# Patient Record
Sex: Female | Born: 1975 | Race: White | Hispanic: No | Marital: Single | Smoking: Former smoker
Health system: Southern US, Community
[De-identification: ages and names within clinical notes are randomized; demographics above are authoritative.]

## PROBLEM LIST (undated history)

## (undated) DIAGNOSIS — F411 Generalized anxiety disorder: Secondary | ICD-10-CM

## (undated) DIAGNOSIS — K219 Gastro-esophageal reflux disease without esophagitis: Secondary | ICD-10-CM

## (undated) DIAGNOSIS — G4730 Sleep apnea, unspecified: Secondary | ICD-10-CM

## (undated) DIAGNOSIS — Z8719 Personal history of other diseases of the digestive system: Secondary | ICD-10-CM

## (undated) DIAGNOSIS — Z860100 Personal history of colon polyps, unspecified: Secondary | ICD-10-CM

## (undated) DIAGNOSIS — J45909 Unspecified asthma, uncomplicated: Secondary | ICD-10-CM

## (undated) DIAGNOSIS — G43909 Migraine, unspecified, not intractable, without status migrainosus: Secondary | ICD-10-CM

## (undated) DIAGNOSIS — Z9109 Other allergy status, other than to drugs and biological substances: Secondary | ICD-10-CM

## (undated) DIAGNOSIS — J984 Other disorders of lung: Secondary | ICD-10-CM

## (undated) DIAGNOSIS — I10 Essential (primary) hypertension: Secondary | ICD-10-CM

## (undated) DIAGNOSIS — R1319 Other dysphagia: Secondary | ICD-10-CM

## (undated) DIAGNOSIS — M1990 Unspecified osteoarthritis, unspecified site: Secondary | ICD-10-CM

## (undated) DIAGNOSIS — A4902 Methicillin resistant Staphylococcus aureus infection, unspecified site: Secondary | ICD-10-CM

## (undated) HISTORY — DX: Other disorders of lung: J98.4

## (undated) HISTORY — DX: Gastro-esophageal reflux disease without esophagitis: K21.9

## (undated) HISTORY — DX: Other allergy status, other than to drugs and biological substances: Z91.09

## (undated) HISTORY — DX: Unspecified asthma, uncomplicated: J45.909

## (undated) HISTORY — DX: Generalized anxiety disorder: F41.1

## (undated) HISTORY — PX: DILATION AND CURETTAGE OF UTERUS: SHX78

## (undated) HISTORY — DX: Migraine, unspecified, not intractable, without status migrainosus: G43.909

## (undated) HISTORY — PX: NECK SURGERY: SHX720

## (undated) HISTORY — DX: Unspecified osteoarthritis, unspecified site: M19.90

## (undated) HISTORY — PX: TYMPANOSTOMY TUBE PLACEMENT: SHX32

## (undated) HISTORY — PX: CHOLECYSTECTOMY: SHX55

## (undated) HISTORY — DX: Personal history of colon polyps, unspecified: Z86.0100

## (undated) HISTORY — DX: Personal history of colonic polyps: Z86.010

---

## 2009-04-13 LAB — HM COLONOSCOPY: HM Colonoscopy: NORMAL

## 2010-08-01 LAB — PULMONARY FUNCTION TEST

## 2015-04-26 ENCOUNTER — Ambulatory Visit (INDEPENDENT_AMBULATORY_CARE_PROVIDER_SITE_OTHER): Payer: BLUE CROSS/BLUE SHIELD | Admitting: Primary Care

## 2015-04-26 ENCOUNTER — Encounter: Payer: Self-pay | Admitting: Primary Care

## 2015-04-26 ENCOUNTER — Ambulatory Visit (INDEPENDENT_AMBULATORY_CARE_PROVIDER_SITE_OTHER)
Admission: RE | Admit: 2015-04-26 | Discharge: 2015-04-26 | Disposition: A | Discharge status: Home / Self Care | Payer: BLUE CROSS/BLUE SHIELD | Source: Ambulatory Visit | Attending: Primary Care | Admitting: Primary Care

## 2015-04-26 VITALS — BP 124/80 | HR 71 | Temp 98.7°F | Ht 67.25 in | Wt 207.1 lb

## 2015-04-26 DIAGNOSIS — J4520 Mild intermittent asthma, uncomplicated: Secondary | ICD-10-CM | POA: Diagnosis not present

## 2015-04-26 DIAGNOSIS — Z842 Family history of other diseases of the genitourinary system: Secondary | ICD-10-CM | POA: Diagnosis not present

## 2015-04-26 DIAGNOSIS — J45909 Unspecified asthma, uncomplicated: Secondary | ICD-10-CM | POA: Insufficient documentation

## 2015-04-26 DIAGNOSIS — R51 Headache: Secondary | ICD-10-CM

## 2015-04-26 DIAGNOSIS — K219 Gastro-esophageal reflux disease without esophagitis: Secondary | ICD-10-CM | POA: Insufficient documentation

## 2015-04-26 DIAGNOSIS — M5441 Lumbago with sciatica, right side: Secondary | ICD-10-CM

## 2015-04-26 DIAGNOSIS — R220 Localized swelling, mass and lump, head: Secondary | ICD-10-CM

## 2015-04-26 DIAGNOSIS — F411 Generalized anxiety disorder: Secondary | ICD-10-CM | POA: Diagnosis not present

## 2015-04-26 DIAGNOSIS — M1990 Unspecified osteoarthritis, unspecified site: Secondary | ICD-10-CM

## 2015-04-26 DIAGNOSIS — R519 Headache, unspecified: Secondary | ICD-10-CM | POA: Insufficient documentation

## 2015-04-26 HISTORY — DX: Lumbago with sciatica, right side: M54.41

## 2015-04-26 MED ORDER — ESCITALOPRAM OXALATE 10 MG PO TABS: 10.0000 mg | ORAL_TABLET | Freq: Every day | ORAL | Status: DC

## 2015-04-26 NOTE — Assessment & Plan Note (Signed)
Long history of. Intensity worse over 2-3 months since mass to cranium. Mass appears to be benign cyst. Explained this to patient who is concerned and would like further evaluation.  US soft tissue ordered as it is causing discomfort.

## 2015-04-26 NOTE — Progress Notes (Signed)
Subjective:    Patient ID: Hannah Klein, female    DOB: Aug 02, 1975, 40 y.o.   MRN: [id number]  HPI  Hannah Klein is a 40 year old female who presents today to establish care and discuss the problems mentioned below. Will obtain old records. Her last physical was in August 2016.  1) Osteoarthritis: Long standing history and has undergone imaging years ago. Located down spinal column from neck to lumbar spine per patient. Currently managed on oxaprozin 600 mg daily. She continues to experience chronic pain to her shoulder, neck, lower back.  She was once managed by pain medicine and completed injections. She didn't feel as though the injections helped to improve her pain. Over the past several months she's noticed right low back pain which radiates down to her right lower extremity. Denies recent injury or trauma.  2) Asthma: Diagnosed 10 years ago. Currently managed on Ventolin HFA and will use twice daily everyday, but recently also during the day. She's not had recent asthma testing since 2012 and was once receiving allergy injections. Denies wheezing. She's recently moved from [place] to [facility] and has noticed an increase in her allergies. She's managed on Claritin daily.   3) GERD: Diagnosed years ago. History of slow digestion as she was tested in the past. Currently managed on Nexium 40 mg as she will have regurgitation of her food if she doesn't take.   4) Anxiety and Depression: History of depression in the past. She was once managed on Prozac for about 1 year. She didn't like the way she felt on this medication so she stopped taking. She continues to experience anxiety with large crowds and when being in new places.   She's currently managed on hydroxyzine PRN for anxiety. Some days she will take it several times, others she will not require. She also has difficulty sleeping. She will have difficulty falling asleep and staying asleep. She feels as though her mind races when she gets  to bed. She will take Zzzquil or hydroxyzine to help her fall asleep. She's also using essential oils. GAD 7 score of 16 today. Denise symptoms of depression.  5) Mass to cranium: Located to the left occipital region of her cranium and has been present for the past 2-3 months. She's noticed increase in headaches since this mass has been present. Mild tenderness, no enlargement, no recent injury.  6) Frequent Headaches/Migraines: Present for years and will occur multiple times weekly. Typically her headaches are located to the frontal lobes, but over the past 2-3 months her headaches have been located to the parietal lobes, especially since the mass to her head as evlolved. She's taken tylenol and ibuprofen with temporary improvement. Migraines occur 2-3 times annually. Denies changes in vision, confusion, disorientation.   Review of Systems  Constitutional: Negative for fever.  HENT: Negative for rhinorrhea.   Eyes: Negative for visual disturbance.  Respiratory: Positive for shortness of breath. Negative for cough and wheezing.   Cardiovascular: Negative for chest pain.  Gastrointestinal:       GERD  Genitourinary:       Regular periods  Musculoskeletal: Positive for back pain and arthralgias.  Allergic/Immunologic: Positive for environmental allergies.  Neurological: Positive for headaches. Negative for numbness.  Psychiatric/Behavioral: Positive for sleep disturbance. The patient is nervous/anxious.        See HPI       Past Medical History  Diagnosis Date  . Asthma   . Arthritis   . Generalized anxiety disorder   .  Migraines   . GERD (gastroesophageal reflux disease)   . Hx of colonic polyps     Social History   Social History  . Marital Status: Single    Spouse Name: N/A  . Number of Children: N/A  . Years of Education: N/A   Occupational History  . Not on file.   Social History Main Topics  . Smoking status: Not on file  . Smokeless tobacco: Not on file  . Alcohol  Use: Not on file  . Drug Use: Not on file  . Sexual Activity: Not on file   Other Topics Concern  . Not on file   Social History Narrative   Single.   Moved from [place].   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       Past Surgical History  Procedure Laterality Date  . Cholecystectomy    . Neck surgery      Fusion to C6 and C7  . Dilation and curettage of uterus      No family history on file.  Allergies  Allergen Reactions  . Advair Diskus [Fluticasone-Salmeterol]     No current outpatient prescriptions on file prior to visit.   No current facility-administered medications on file prior to visit.    BP 124/80 mmHg  Pulse 71  Temp(Src) 98.7 F (37.1 C) (Oral)  Ht 5' 7.25" (1.708 m)  Wt 207 lb 1.9 oz (93.949 kg)  BMI 32.20 kg/m2  SpO2 99%  LMP 04/14/2015    Objective:   Physical Exam  Constitutional: She is oriented to person, place, and time. She appears well-nourished.  Eyes: EOM are normal.  Neck: Neck supple.  Cardiovascular: Normal rate and regular rhythm.   Pulmonary/Chest: Breath sounds normal. She has no wheezes. She has no rales.  Musculoskeletal: Normal range of motion.  Negative straight leg raise bilaterally.  Neurological: She is alert and oriented to person, place, and time. No cranial nerve deficit.  Skin: Skin is warm and dry.  Psychiatric: She has a normal mood and affect.          Assessment & Plan:  >45 minutes spent face to face with patient, >50% spent counseling or coordinating care.

## 2015-04-26 NOTE — Assessment & Plan Note (Signed)
managed on Nexium 40 mg daily. Endorses study by GI in past with slow digestion. Will vomit undigested food without Nexium. Discussed long term effects of this medication. Continue same.

## 2015-04-26 NOTE — Progress Notes (Signed)
Pre visit review using our clinic review tool, if applicable. No additional management support is needed unless otherwise documented below in the visit note. 

## 2015-04-26 NOTE — Assessment & Plan Note (Signed)
Xray pending. Will obtain old records, consider MRI.

## 2015-04-26 NOTE — Assessment & Plan Note (Signed)
Endorses presence to shoulder, neck, and spinal column from prior imaging. Once managed per pain management with injections, no improvement. Increased pain to right lower back with radiation to RLE. Exam unremarkable. Xray pending. Will obtain old records and consider MRI.

## 2015-04-26 NOTE — Assessment & Plan Note (Addendum)
Long history of. Currently on hydroxyzine. Uncontrolled today with GAD 7 score of 16.  Start Lexapro 10 mg. Patient is to take 1/2 tablet daily for 6 days, then advance to 1 full tablet thereafter. We discussed possible side effects of headache, GI upset, drowsiness, and SI/HI. If thoughts of SI/HI develop, we discussed to present to the emergency immediately. Patient verbalized understanding.   Continue Hydroxyzine PRN.  Follow up in 6 weeks for re-evaluation.

## 2015-04-26 NOTE — Assessment & Plan Note (Signed)
History of for 10 years. Currently managed on Ventolin which she uses BID and sometimes more. Spirometry normal in office today. No wheezing on exam.  Continue daily Claritin.  Due to use of Ventolin, will consider adding low dose ICS.

## 2015-04-26 NOTE — Patient Instructions (Signed)
Complete xray(s) prior to leaving today. I will notify you of your results once received.  Stop by the front desk and speak with either Shirlee Limerick or Revonda Standard regarding your ultrasound appointment.   Start Lexapro tablets for anxiety. Take 1/2 tablet daily for 6 days, then advance to 1 full tablet thereafter.  Follow up in 6 weeks for re-evaluation of anxiety and sleep.  It was a pleasure to meet you today! Please don't hesitate to call me with any questions. Welcome to [facility]!

## 2015-04-27 ENCOUNTER — Telehealth: Payer: Self-pay | Admitting: Primary Care

## 2015-04-27 NOTE — Telephone Encounter (Signed)
Please notify [name] that I've received/reviewed her imaging records and think she may need another MRI of her back. If she's okay with this I'll order it.  Let me know, thanks.

## 2015-04-28 ENCOUNTER — Telehealth: Payer: Self-pay | Admitting: Primary Care

## 2015-04-28 DIAGNOSIS — M5442 Lumbago with sciatica, left side: Principal | ICD-10-CM

## 2015-04-28 DIAGNOSIS — M5441 Lumbago with sciatica, right side: Secondary | ICD-10-CM

## 2015-04-28 NOTE — Telephone Encounter (Signed)
Called and notified patient of Kate's comments. Patient stated that she will go do the MRI and already let patient know someone in our office will call her. Patient also stated that she is claustrophobic and the last time, she was given valium.

## 2015-04-28 NOTE — Telephone Encounter (Signed)
Noted. MRI placed. 

## 2015-04-29 ENCOUNTER — Telehealth: Payer: Self-pay | Admitting: Primary Care

## 2015-04-29 DIAGNOSIS — F40240 Claustrophobia: Secondary | ICD-10-CM

## 2015-04-29 MED ORDER — DIAZEPAM 5 MG PO TABS: ORAL_TABLET | ORAL | Status: DC

## 2015-04-29 NOTE — Addendum Note (Signed)
Addended by: Tawnya CrookSAMBATH, Dennie Moltz on: 04/29/2015 03:16 PM   Modules accepted: Orders

## 2015-04-29 NOTE — Telephone Encounter (Signed)
Patient is requesting a RX for Valium in order to get through the MRI. Please call her in a RX.

## 2015-04-29 NOTE — Telephone Encounter (Signed)
Called in the Valium to [facility].

## 2015-04-29 NOTE — Telephone Encounter (Signed)
Okay to phone in Valium 5 mg tablets. Take 1 tablet by mouth 45 minutes prior to MRI. May repeat with second dose if no improvement after 1 hour.  #2, no refills.

## 2015-04-29 NOTE — Telephone Encounter (Signed)
Called patient that the Rx has been called in. Patient verbalized understanding.

## 2015-05-02 ENCOUNTER — Telehealth: Payer: Self-pay | Admitting: Primary Care

## 2015-05-02 ENCOUNTER — Ambulatory Visit
Admission: RE | Admit: 2015-05-02 | Discharge: 2015-05-02 | Disposition: A | Discharge status: Home / Self Care | Payer: BLUE CROSS/BLUE SHIELD | Source: Ambulatory Visit | Attending: Primary Care | Admitting: Primary Care

## 2015-05-02 DIAGNOSIS — R220 Localized swelling, mass and lump, head: Secondary | ICD-10-CM

## 2015-05-02 NOTE — Telephone Encounter (Signed)
Rec'd from [facility] forward 116 pages to Dr. Chestine Sporelark

## 2015-05-03 ENCOUNTER — Other Ambulatory Visit: Payer: Self-pay | Admitting: Primary Care

## 2015-05-03 ENCOUNTER — Telehealth: Payer: Self-pay | Admitting: Primary Care

## 2015-05-03 DIAGNOSIS — R220 Localized swelling, mass and lump, head: Secondary | ICD-10-CM

## 2015-05-03 DIAGNOSIS — R221 Localized swelling, mass and lump, neck: Principal | ICD-10-CM

## 2015-05-03 NOTE — Telephone Encounter (Signed)
Please notify [name] that the insurance will not pay for another MRI to evaluate her back pain. We have a few options:  1) We can send her to pain management for further evaluation. 2) We can send her to neurosurgery for further evaluation. 3) We can try physical therapy to help with symptoms.  Please notify me what she'd like to do. Thanks.

## 2015-05-04 ENCOUNTER — Other Ambulatory Visit: Payer: Self-pay | Admitting: Primary Care

## 2015-05-04 ENCOUNTER — Ambulatory Visit (INDEPENDENT_AMBULATORY_CARE_PROVIDER_SITE_OTHER)
Admission: RE | Admit: 2015-05-04 | Discharge: 2015-05-04 | Disposition: A | Discharge status: Home / Self Care | Payer: BLUE CROSS/BLUE SHIELD | Source: Ambulatory Visit | Attending: Primary Care | Admitting: Primary Care

## 2015-05-04 DIAGNOSIS — R221 Localized swelling, mass and lump, neck: Principal | ICD-10-CM

## 2015-05-04 DIAGNOSIS — R220 Localized swelling, mass and lump, head: Secondary | ICD-10-CM

## 2015-05-04 MED ORDER — IOHEXOL 300 MG/ML  SOLN
80.0000 mL | Freq: Once | INTRAMUSCULAR | Status: AC | PRN
  Administered 2015-05-04: 80 mL via INTRAVENOUS

## 2015-05-04 NOTE — Telephone Encounter (Signed)
Called and notified patient of Kate's comments. Patient verbalized understanding. Patient stated she wants to check with her insurance first then let us know.

## 2015-05-05 ENCOUNTER — Telehealth: Payer: Self-pay | Admitting: Primary Care

## 2015-05-05 DIAGNOSIS — M545 Low back pain: Secondary | ICD-10-CM

## 2015-05-05 NOTE — Telephone Encounter (Signed)
Patient returned Chan's call. °

## 2015-05-05 NOTE — Telephone Encounter (Signed)
Called patient and notified her of her CT results. Patient stated that she have not checked her insurance why they will not pay for another MRI. She said she would like the referral to pain management for further evaluation.

## 2015-05-05 NOTE — Telephone Encounter (Signed)
Noted  Referral placed.

## 2015-05-05 NOTE — Telephone Encounter (Signed)
Already spoken to patient this morning regarding referral to pain management.

## 2015-05-06 ENCOUNTER — Encounter: Payer: Self-pay | Admitting: Primary Care

## 2015-05-12 ENCOUNTER — Other Ambulatory Visit: Payer: Self-pay | Admitting: Pain Medicine

## 2015-05-12 ENCOUNTER — Ambulatory Visit (HOSPITAL_COMMUNITY): Payer: BLUE CROSS/BLUE SHIELD

## 2015-05-12 DIAGNOSIS — M545 Low back pain: Secondary | ICD-10-CM

## 2015-05-21 ENCOUNTER — Ambulatory Visit
Admission: RE | Admit: 2015-05-21 | Discharge: 2015-05-21 | Disposition: A | Discharge status: Home / Self Care | Payer: BLUE CROSS/BLUE SHIELD | Source: Ambulatory Visit | Attending: Pain Medicine | Admitting: Pain Medicine

## 2015-05-21 DIAGNOSIS — M545 Low back pain: Secondary | ICD-10-CM

## 2015-06-07 ENCOUNTER — Ambulatory Visit (INDEPENDENT_AMBULATORY_CARE_PROVIDER_SITE_OTHER): Payer: BLUE CROSS/BLUE SHIELD | Admitting: Primary Care

## 2015-06-07 ENCOUNTER — Encounter: Payer: Self-pay | Admitting: Primary Care

## 2015-06-07 VITALS — BP 120/82 | HR 71 | Temp 98.2°F | Ht 67.0 in | Wt 194.4 lb

## 2015-06-07 DIAGNOSIS — Z1239 Encounter for other screening for malignant neoplasm of breast: Secondary | ICD-10-CM | POA: Diagnosis not present

## 2015-06-07 DIAGNOSIS — F411 Generalized anxiety disorder: Secondary | ICD-10-CM

## 2015-06-07 NOTE — Progress Notes (Signed)
Subjective:    Patient ID: Hannah Klein, female    DOB: 1975/10/20, 40 y.o.   MRN: [id number]  HPI  Hannah Klein is a 40 year old female who presents today for follow up.  1) Generalized Anxiety Disorder: Presented as a new patient on 02/28 with uncontrolled anxiety with a GAD 7 score of 16. She was taking hydroxyzine PRN, but did not feel well controlled. She was initiated on Lexapro 10 mg and continued on hydroxyzine PRN last visit.   Since her last visit she's noticed improvement and is feeling better. She's noticed an improvement in sleep and shortness of breath, she doesn't feel as tired, and is less anxious. She is using the hydroxyzine 5 times weekly on average. Denies SI/HI.   Review of Systems  Respiratory: Negative for shortness of breath.   Cardiovascular: Negative for chest pain.  Psychiatric/Behavioral: Negative for suicidal ideas and sleep disturbance. The patient is not nervous/anxious.        Past Medical History  Diagnosis Date  . Asthma   . Arthritis   . Generalized anxiety disorder   . Migraines   . GERD (gastroesophageal reflux disease)   . Hx of colonic polyps   . Environmental allergies     Social History   Social History  . Marital Status: Single    Spouse Name: N/A  . Number of Children: N/A  . Years of Education: N/A   Occupational History  . Not on file.   Social History Main Topics  . Smoking status: Never Smoker   . Smokeless tobacco: Not on file  . Alcohol Use: No  . Drug Use: No  . Sexual Activity: Not on file   Other Topics Concern  . Not on file   Social History Narrative   Single.   Moved from [place].   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       Past Surgical History  Procedure Laterality Date  . Cholecystectomy    . Neck surgery      Fusion to C6 and C7  . Dilation and curettage of uterus      No family history on file.  Allergies  Allergen Reactions  . Advair Diskus [Fluticasone-Salmeterol]     Current  Outpatient Prescriptions on File Prior to Visit  Medication Sig Dispense Refill  . docusate sodium (COLACE) 100 MG capsule Take 100 mg by mouth daily.    Marland Kitchen escitalopram (LEXAPRO) 10 MG tablet Take 1 tablet (10 mg total) by mouth daily. 30 tablet 3  . esomeprazole (NEXIUM) 40 MG capsule Take 40 mg by mouth 2 (two) times daily.  0  . hydrOXYzine (VISTARIL) 50 MG capsule Take 50 mg by mouth 4 (four) times daily.  1  . Lactobacillus Rhamnosus, GG, (CULTURELLE PO) Take 1 capsule by mouth daily.    Marland Kitchen loratadine (CLARITIN) 10 MG tablet Take 10 mg by mouth 2 (two) times daily.    . Multiple Vitamin (MULTIVITAMIN) capsule Take 1 capsule by mouth daily.    Marland Kitchen oxaprozin (DAYPRO) 600 MG tablet Take 600 mg by mouth daily.  1  . VENTOLIN HFA 108 (90 Base) MCG/ACT inhaler Inhale 1-2 puffs into the lungs every 4 (four) hours as needed.   0  . vitamin B-12 (CYANOCOBALAMIN) 1000 MCG tablet Take 1,000 mcg by mouth daily.    . vitamin C (ASCORBIC ACID) 500 MG tablet Take 500 mg by mouth daily.     No current facility-administered medications on file prior to visit.  BP 120/82 mmHg  Pulse 71  Temp(Src) 98.2 F (36.8 C) (Oral)  Ht 5\' 7"  (1.702 m)  Wt 194 lb 6.4 oz (88.179 kg)  BMI 30.44 kg/m2  SpO2 99%  LMP 06/04/2015    Objective:   Physical Exam  Constitutional: She appears well-nourished.  Cardiovascular: Normal rate and regular rhythm.   Pulmonary/Chest: Effort normal and breath sounds normal.  Skin: Skin is warm and dry.  Psychiatric: She has a normal mood and affect.          Assessment & Plan:

## 2015-06-07 NOTE — Assessment & Plan Note (Signed)
Improved on Lexapro 10 mg and will continue same. Use hydroxyzine PRN for breakthrough anxiety. Appears to be in better spirits this visit when compared to last. Denies SI/HI. Will continue to monitor.

## 2015-06-07 NOTE — Patient Instructions (Addendum)
You will be contacted regarding your mammogram .  Please let us know if you have not heard back within one week.   Continue Lexapro 10 mg tablets. You may continue the hydroxyzine as needed, try taking at bedtime as needed for sleep.  Keep me updated regarding the progression of your back/spine and notify me if you need anything.  We will see you in August 2017 for your annual physical.  It was a pleasure to see you today!

## 2015-06-07 NOTE — Progress Notes (Signed)
Pre visit review using our clinic review tool, if applicable. No additional management support is needed unless otherwise documented below in the visit note. 

## 2015-06-10 ENCOUNTER — Other Ambulatory Visit: Payer: Self-pay | Admitting: Internal Medicine

## 2015-06-10 DIAGNOSIS — Z1231 Encounter for screening mammogram for malignant neoplasm of breast: Secondary | ICD-10-CM

## 2015-07-18 ENCOUNTER — Ambulatory Visit
Admission: RE | Admit: 2015-07-18 | Discharge: 2015-07-18 | Disposition: A | Discharge status: Home / Self Care | Payer: BLUE CROSS/BLUE SHIELD | Source: Ambulatory Visit | Attending: Internal Medicine | Admitting: Internal Medicine

## 2015-07-18 DIAGNOSIS — Z1231 Encounter for screening mammogram for malignant neoplasm of breast: Secondary | ICD-10-CM

## 2015-08-22 ENCOUNTER — Other Ambulatory Visit: Payer: Self-pay

## 2015-08-22 MED ORDER — HYDROXYZINE PAMOATE 50 MG PO CAPS: 50.0000 mg | ORAL_CAPSULE | Freq: Three times a day (TID) | ORAL | Status: DC | PRN

## 2015-08-22 NOTE — Telephone Encounter (Signed)
Pt left v/m requesting refill hydoxyzine to [facility] [address]. Last seen 06/07/15; do not see where Mayra ReelKate  Clark NP has previously filled med.Please advise. Pt request cb.

## 2015-09-28 ENCOUNTER — Other Ambulatory Visit: Payer: Self-pay | Admitting: Primary Care

## 2015-09-28 DIAGNOSIS — F411 Generalized anxiety disorder: Secondary | ICD-10-CM

## 2015-09-28 NOTE — Telephone Encounter (Signed)
Ok to refill? Electronically refill request for hydrOXYzine (VISTARIL) 50 MG capsule. Take 1 capsule (50 mg total) by mouth 3 (three) times daily as needed for anxiety.  Last prescribed on 08/22/2015. Last seen on 06/07/2015. CPE on 10/19/2015.

## 2015-10-09 ENCOUNTER — Other Ambulatory Visit: Payer: Self-pay | Admitting: Primary Care

## 2015-10-09 DIAGNOSIS — Z0000 Encounter for general adult medical examination without abnormal findings: Secondary | ICD-10-CM

## 2015-10-14 ENCOUNTER — Other Ambulatory Visit: Payer: BLUE CROSS/BLUE SHIELD

## 2015-10-19 ENCOUNTER — Encounter: Payer: BLUE CROSS/BLUE SHIELD | Admitting: Primary Care

## 2015-11-10 ENCOUNTER — Other Ambulatory Visit: Payer: Self-pay

## 2015-11-10 MED ORDER — ESOMEPRAZOLE MAGNESIUM 40 MG PO CPDR
40.0000 mg | DELAYED_RELEASE_CAPSULE | Freq: Two times a day (BID) | ORAL | 0 refills | Status: DC
Start: 2015-11-10 — End: 2015-11-24

## 2015-11-10 NOTE — Telephone Encounter (Signed)
Pt requesting refill nexium to [facility] [address]. Pt seen 04/26/15 and was to continue nexium but when seen 06/07/15 pt was to have CPX in 09/2015. Pt had to cancel that appt. Pt rescheduled CPX for 11/23/15 and refilled nexium # 60 x 0 refills u ntil seen. Pt voiced understanding.

## 2015-11-23 ENCOUNTER — Ambulatory Visit (INDEPENDENT_AMBULATORY_CARE_PROVIDER_SITE_OTHER): Payer: BLUE CROSS/BLUE SHIELD | Admitting: Primary Care

## 2015-11-23 ENCOUNTER — Encounter: Payer: Self-pay | Admitting: Primary Care

## 2015-11-23 DIAGNOSIS — K5900 Constipation, unspecified: Secondary | ICD-10-CM | POA: Insufficient documentation

## 2015-11-23 DIAGNOSIS — Z0001 Encounter for general adult medical examination with abnormal findings: Secondary | ICD-10-CM | POA: Insufficient documentation

## 2015-11-23 DIAGNOSIS — Z0000 Encounter for general adult medical examination without abnormal findings: Secondary | ICD-10-CM | POA: Diagnosis not present

## 2015-11-23 DIAGNOSIS — F411 Generalized anxiety disorder: Secondary | ICD-10-CM

## 2015-11-23 LAB — COMPREHENSIVE METABOLIC PANEL
ALBUMIN: 4.2 g/dL (ref 3.5–5.2)
ALK PHOS: 36 U/L — AB (ref 39–117)
ALT: 18 U/L (ref 0–35)
AST: 15 U/L (ref 0–37)
BUN: 20 mg/dL (ref 6–23)
CO2: 30 mEq/L (ref 19–32)
Calcium: 9.2 mg/dL (ref 8.4–10.5)
Chloride: 102 mEq/L (ref 96–112)
Creatinine, Ser: 0.81 mg/dL (ref 0.40–1.20)
GFR: 82.67 mL/min (ref >60.00)
Glucose, Bld: 86 mg/dL (ref 70–99)
POTASSIUM: 4.2 meq/L (ref 3.5–5.1)
Sodium: 139 mEq/L (ref 135–145)
TOTAL PROTEIN: 6.9 g/dL (ref 6.0–8.3)
Total Bilirubin: 0.7 mg/dL (ref 0.2–1.2)

## 2015-11-23 LAB — LIPID PANEL
Cholesterol: 232 mg/dL — ABNORMAL HIGH (ref 0–200)
HDL: 58.5 mg/dL (ref >39.00)
LDL Cholesterol: 143 mg/dL — ABNORMAL HIGH (ref 0–99)
NONHDL: 173.56
Total CHOL/HDL Ratio: 4
Triglycerides: 153 mg/dL — ABNORMAL HIGH (ref 0.0–149.0)
VLDL: 30.6 mg/dL (ref 0.0–40.0)

## 2015-11-23 LAB — HEMOGLOBIN A1C: HEMOGLOBIN A1C: 5.3 % (ref 4.6–6.5)

## 2015-11-23 LAB — VITAMIN D 25 HYDROXY (VIT D DEFICIENCY, FRACTURES): VITD: 25.86 ng/mL — ABNORMAL LOW (ref 30.00–100.00)

## 2015-11-23 NOTE — Assessment & Plan Note (Signed)
Feels well managed on current regimen. No longer taking Lexapro as symptoms of anxiety have decreased since her mother's overall mood and health has improved. Uses hydroxyzine as needed.

## 2015-11-23 NOTE — Progress Notes (Signed)
Pre visit review using our clinic review tool, if applicable. No additional management support is needed unless otherwise documented below in the visit note. 

## 2015-11-23 NOTE — Progress Notes (Signed)
Subjective:    Patient ID: Hannah Klein, female    DOB: 09/16/1975, 40 y.o.   MRN: [id number]  HPI  Hannah Klein is a 40 year old female who presents today for complete physical.  Immunizations: -Tetanus: Completed within 10 years. -Influenza: Declines  Diet: She endorses fair diet. Breakfast: Fast food, skips  Lunch: Left overs Dinner: Pizza, pasta, chicken, little vegetables Snacks: Crackers, chips Desserts: Occasionally Beverages: Water, occasional sweet tea/sodas  Exercise: She does not currently exercise Eye exam: Completed 1 year ago. Dental exam: Does not regularly attend. Pap Smear: Completed in 2017, normal Mammogram: Completed in May 2017  Wt Readings from Last 3 Encounters:  11/23/15 196 lb (88.9 kg)  06/07/15 194 lb 6.4 oz (88.2 kg)  04/26/15 207 lb 1.9 oz (93.9 kg)      Review of Systems  Constitutional: Negative for unexpected weight change.  HENT: Negative for rhinorrhea.   Respiratory: Negative for cough and shortness of breath.   Cardiovascular: Negative for chest pain.  Gastrointestinal: Negative for constipation and diarrhea.       Takes probiotics for constipation  Genitourinary: Negative for difficulty urinating and menstrual problem.  Musculoskeletal: Negative for arthralgias and myalgias.  Skin: Negative for rash.  Allergic/Immunologic: Positive for environmental allergies.  Neurological: Positive for headaches. Negative for dizziness and numbness.  Psychiatric/Behavioral:       Denies concerns for anxiety and depression. Improvement in anxiety with hydroxyzine.        Past Medical History:  Diagnosis Date  . Arthritis   . Asthma   . Environmental allergies   . Generalized anxiety disorder   . GERD (gastroesophageal reflux disease)   . Hx of colonic polyps   . Migraines      Social History   Social History  . Marital status: Single    Spouse name: N/A  . Number of children: N/A  . Years of education: N/A   Occupational  History  . Not on file.   Social History Main Topics  . Smoking status: Never Smoker  . Smokeless tobacco: Not on file  . Alcohol use No  . Drug use: No  . Sexual activity: Not on file   Other Topics Concern  . Not on file   Social History Narrative   Single.   [place]   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       Past Surgical History:  Procedure Laterality Date  . CHOLECYSTECTOMY    . DILATION AND CURETTAGE OF UTERUS    . NECK SURGERY     Fusion to C6 and C7    No family history on file.  Allergies  Allergen Reactions  . Advair Diskus [Fluticasone-Salmeterol]     Current Outpatient Prescriptions on File Prior to Visit  Medication Sig Dispense Refill  . docusate sodium (COLACE) 100 MG capsule Take 100 mg by mouth daily.    Marland Kitchen. esomeprazole (NEXIUM) 40 MG capsule Take 1 capsule (40 mg total) by mouth 2 (two) times daily. 60 capsule 0  . hydrOXYzine (VISTARIL) 50 MG capsule TAKE ONE CAPSULE BY MOUTH THREE TIMES DAILY AS NEEDED FOR ANXIETY 90 capsule 3  . Lactobacillus Rhamnosus, GG, (CULTURELLE PO) Take 1 capsule by mouth daily.    Marland Kitchen. loratadine (CLARITIN) 10 MG tablet Take 10 mg by mouth 2 (two) times daily.    . Multiple Vitamin (MULTIVITAMIN) capsule Take 1 capsule by mouth daily.    Marland Kitchen. oxaprozin (DAYPRO) 600 MG tablet Take 600 mg by mouth daily.  1  . VENTOLIN HFA 108 (90 Base) MCG/ACT inhaler Inhale 1-2 puffs into the lungs every 4 (four) hours as needed.   0  . vitamin B-12 (CYANOCOBALAMIN) 1000 MCG tablet Take 1,000 mcg by mouth daily.    . vitamin C (ASCORBIC ACID) 500 MG tablet Take 500 mg by mouth daily.     No current facility-administered medications on file prior to visit.     BP 116/78   Pulse 69   Temp 98.2 F (36.8 C) (Oral)   Ht 5\' 7"  (1.702 m)   Wt 196 lb (88.9 kg)   LMP 11/09/2015   SpO2 97%   BMI 30.70 kg/m    Objective:   Physical Exam  Constitutional: She is oriented to person, place, and time. She appears well-nourished.  HENT:    Right Ear: Tympanic membrane and ear canal normal.  Left Ear: Tympanic membrane and ear canal normal.  Nose: Nose normal.  Mouth/Throat: Oropharynx is clear and moist.  Eyes: Conjunctivae and EOM are normal. Pupils are equal, round, and reactive to light.  Neck: Neck supple. No thyromegaly present.  Cardiovascular: Normal rate and regular rhythm.   No murmur heard. Pulmonary/Chest: Effort normal and breath sounds normal. She has no rales.  Abdominal: Soft. Bowel sounds are normal. There is no tenderness.  Musculoskeletal: Normal range of motion.  Lymphadenopathy:    She has no cervical adenopathy.  Neurological: She is alert and oriented to person, place, and time. She has normal reflexes. No cranial nerve deficit.  Skin: Skin is warm and dry. No rash noted.  Psychiatric: She has a normal mood and affect.          Assessment & Plan:

## 2015-11-23 NOTE — Assessment & Plan Note (Signed)
Imitations up-to-date, declines influenza vaccination. Pap and mammogram up-to-date. Discussed the importance of a healthy diet and regular exercise in order for weight loss, and to reduce the risk of other medical diseases. Discussed increase of fiber and water intake for constipation. Start MiraLAX and continue probiotics. Exam unremarkable. Labs pending. Follow-up in one year for repeat physical.

## 2015-11-23 NOTE — Patient Instructions (Addendum)
Try Miralax powder for constipation. Mix 1 capful of powder into at least 8 ounces of water daily for 2 weeks. May continue several days weekly if needed.  Restart your probiotics as discussed.  Complete lab work prior to leaving today. I will notify you of your results once received.   Start exercising. You should be getting 150 minutes of moderate intensity exercise weekly.  It's importance to improve your diet by reducing consumption of fast food, fried food, processed snack foods, sugary drinks. Increase consumption of fresh vegetables and fruits, whole grains, water.  Ensure you are drinking 64 ounces of water daily.  Follow up in 1 year for repeat physical or sooner if needed.  It was a pleasure to see you today!  High-Fiber Diet Fiber, also called dietary fiber, is a type of carbohydrate found in fruits, vegetables, whole grains, and beans. A high-fiber diet can have many health benefits. Your health care provider may recommend a high-fiber diet to help:  Prevent constipation. Fiber can make your bowel movements more regular.  Lower your cholesterol.  Relieve hemorrhoids, uncomplicated diverticulosis, or irritable bowel syndrome.  Prevent overeating as part of a weight-loss plan.  Prevent heart disease, type 2 diabetes, and certain cancers. WHAT IS MY PLAN? The recommended daily intake of fiber includes:  38 grams for men under age 41.  30 grams for men over age 41.  25 grams for women under age 10950.  21 grams for women over age 41. You can get the recommended daily intake of dietary fiber by eating a variety of fruits, vegetables, grains, and beans. Your health care provider may also recommend a fiber supplement if it is not possible to get enough fiber through your diet. WHAT DO I NEED TO KNOW ABOUT A HIGH-FIBER DIET?  Fiber supplements have not been widely studied for their effectiveness, so it is better to get fiber through food sources.  Always check the fiber  content on thenutrition facts label of any prepackaged food. Look for foods that contain at least 5 grams of fiber per serving.  Ask your dietitian if you have questions about specific foods that are related to your condition, especially if those foods are not listed in the following section.  Increase your daily fiber consumption gradually. Increasing your intake of dietary fiber too quickly may cause bloating, cramping, or gas.  Drink plenty of water. Water helps you to digest fiber. WHAT FOODS CAN I EAT? Grains Whole-grain breads. Multigrain cereal. Oats and oatmeal. Brown rice. Barley. Bulgur wheat. Millet. Bran muffins. Popcorn. Rye wafer crackers. Vegetables Sweet potatoes. Spinach. Kale. Artichokes. Cabbage. Broccoli. Green peas. Carrots. Squash. Fruits Berries. Pears. Apples. Oranges. Avocados. Prunes and raisins. Dried figs. Meats and Other Protein Sources [place], kidney, pinto, and soy beans. Split peas. Lentils. Nuts and seeds. Dairy Fiber-fortified yogurt. Beverages Fiber-fortified soy milk. Fiber-fortified orange juice. Other Fiber bars. The items listed above may not be a complete list of recommended foods or beverages. Contact your dietitian for more options. WHAT FOODS ARE NOT RECOMMENDED? Grains White bread. Pasta made with refined flour. White rice. Vegetables Fried potatoes. Canned vegetables. Well-cooked vegetables.  Fruits Fruit juice. Cooked, strained fruit. Meats and Other Protein Sources Fatty cuts of meat. Fried Environmental education officerpoultry or fried fish. Dairy Milk. Yogurt. Cream cheese. Sour cream. Beverages Soft drinks. Other Cakes and pastries. Butter and oils. The items listed above may not be a complete list of foods and beverages to avoid. Contact your dietitian for more information. WHAT ARE SOME TIPS  FOR INCLUDING HIGH-FIBER FOODS IN MY DIET?  Eat a wide variety of high-fiber foods.  Make sure that half of all grains consumed each day are whole  grains.  Replace breads and cereals made from refined flour or white flour with whole-grain breads and cereals.  Replace white rice with brown rice, bulgur wheat, or millet.  Start the day with a breakfast that is high in fiber, such as a cereal that contains at least 5 grams of fiber per serving.  Use beans in place of meat in soups, salads, or pasta.  Eat high-fiber snacks, such as berries, raw vegetables, nuts, or popcorn.   This information is not intended to replace advice given to you by your health care provider. Make sure you discuss any questions you have with your health care provider.   Document Released: 02/12/2005 Document Revised: 03/05/2014 Document Reviewed: 07/28/2013 Elsevier Interactive Patient Education [employer]

## 2015-11-23 NOTE — Assessment & Plan Note (Signed)
Increased constipation since she's run out of probiotics. Discussed to continue probiotics and sitter MiraLAX as needed. Also discussed importance of high-fiber diet, exercise, water consumption.

## 2015-11-24 ENCOUNTER — Other Ambulatory Visit: Payer: Self-pay | Admitting: Primary Care

## 2015-11-24 ENCOUNTER — Telehealth: Payer: Self-pay | Admitting: Primary Care

## 2015-11-24 DIAGNOSIS — J309 Allergic rhinitis, unspecified: Secondary | ICD-10-CM

## 2015-11-24 MED ORDER — LEVOCETIRIZINE DIHYDROCHLORIDE 5 MG PO TABS: 5.0000 mg | ORAL_TABLET | Freq: Every evening | ORAL | 3 refills | Status: DC

## 2015-11-24 NOTE — Telephone Encounter (Signed)
Pt left v/m pt was seen 11/23/15 and thought rx for xyzal was going to be sent to [facility] [address]. Pt request cb.

## 2015-11-24 NOTE — Telephone Encounter (Signed)
Please apologize to patient, I just sent it in now.

## 2015-11-24 NOTE — Telephone Encounter (Signed)
Spoken and notified patient of Kate's comments. Patient verbalized understanding. 

## 2016-03-02 ENCOUNTER — Other Ambulatory Visit: Payer: Self-pay | Admitting: Primary Care

## 2016-03-02 DIAGNOSIS — F411 Generalized anxiety disorder: Secondary | ICD-10-CM

## 2016-03-30 ENCOUNTER — Other Ambulatory Visit: Payer: Self-pay | Admitting: Primary Care

## 2016-05-01 ENCOUNTER — Other Ambulatory Visit: Payer: Self-pay | Admitting: Primary Care

## 2016-05-01 DIAGNOSIS — F411 Generalized anxiety disorder: Secondary | ICD-10-CM

## 2016-05-01 NOTE — Telephone Encounter (Signed)
Ok to refill? Electronically refill request for hydrOXYzine (VISTARIL) 50 MG capsule. Last prescribed on 03/02/2016. Last seen on 11/23/2015.

## 2016-06-14 ENCOUNTER — Other Ambulatory Visit: Payer: Self-pay

## 2016-06-14 DIAGNOSIS — J4520 Mild intermittent asthma, uncomplicated: Secondary | ICD-10-CM

## 2016-06-14 MED ORDER — VENTOLIN HFA 108 (90 BASE) MCG/ACT IN AERS: 1.0000 | INHALATION_SPRAY | RESPIRATORY_TRACT | 1 refills | Status: DC | PRN

## 2016-06-14 NOTE — Telephone Encounter (Signed)
Pt left v/m requesting refill for ventolin inhaler; pt last seen 11/23/15. Mayra Reel NP has not prescribed before. [facility] [address]

## 2016-06-14 NOTE — Telephone Encounter (Signed)
Per DPR left v/m ventolin at [facility].

## 2016-07-13 ENCOUNTER — Other Ambulatory Visit: Payer: Self-pay | Admitting: Primary Care

## 2016-07-13 DIAGNOSIS — F411 Generalized anxiety disorder: Secondary | ICD-10-CM

## 2016-08-16 ENCOUNTER — Other Ambulatory Visit: Payer: Self-pay | Admitting: Primary Care

## 2016-08-16 DIAGNOSIS — F411 Generalized anxiety disorder: Secondary | ICD-10-CM

## 2016-09-24 ENCOUNTER — Other Ambulatory Visit: Payer: Self-pay | Admitting: Primary Care

## 2016-09-24 DIAGNOSIS — F411 Generalized anxiety disorder: Secondary | ICD-10-CM

## 2016-09-24 DIAGNOSIS — J309 Allergic rhinitis, unspecified: Secondary | ICD-10-CM

## 2016-09-24 NOTE — Telephone Encounter (Signed)
Ok to refill? Electronically refill request for   hydrOXYzine (VISTARIL) 50 MG capsule  Last prescribed on 08/17/2016  levocetirizine (XYZAL) 5 MG tablet  Last prescribed on 11/24/2015  Last seen on CPE on 11/23/2015

## 2016-09-24 NOTE — Telephone Encounter (Signed)
Refills sent to pharmacy. Needs CPE in late September, early October, please schedule.

## 2016-09-24 NOTE — Telephone Encounter (Signed)
Message left for patient to return my call.  

## 2016-09-25 NOTE — Telephone Encounter (Signed)
Send patient a message through MyChart. 

## 2016-09-25 NOTE — Telephone Encounter (Signed)
Send a letter for patient to schedule CPE

## 2016-10-01 ENCOUNTER — Ambulatory Visit (INDEPENDENT_AMBULATORY_CARE_PROVIDER_SITE_OTHER): Payer: BLUE CROSS/BLUE SHIELD | Admitting: Primary Care

## 2016-10-01 ENCOUNTER — Encounter: Payer: Self-pay | Admitting: Primary Care

## 2016-10-01 VITALS — BP 116/80 | HR 84 | Temp 98.1°F | Ht 67.0 in | Wt 211.4 lb

## 2016-10-01 DIAGNOSIS — J4540 Moderate persistent asthma, uncomplicated: Secondary | ICD-10-CM | POA: Diagnosis not present

## 2016-10-01 DIAGNOSIS — R0789 Other chest pain: Secondary | ICD-10-CM | POA: Diagnosis not present

## 2016-10-01 MED ORDER — MONTELUKAST SODIUM 10 MG PO TABS: 10.0000 mg | ORAL_TABLET | Freq: Every day | ORAL | 1 refills | Status: DC

## 2016-10-01 MED ORDER — FLUTICASONE PROPIONATE HFA 44 MCG/ACT IN AERO: 2.0000 | INHALATION_SPRAY | Freq: Two times a day (BID) | RESPIRATORY_TRACT | 2 refills | Status: DC

## 2016-10-01 NOTE — Progress Notes (Signed)
Subjective:    Patient ID: Hannah Klein, female    DOB: Mar 04, 1975, 41 y.o.   MRN: [id number]  HPI  [name] is a 41 year old female with a history of asthma and allergic rhinitis who presents today with a chief complaint of shortness of breath. She also reports chest tightness, difficulty catching her breath and will have to yawn to get a deep enough breath. [facility], had to stop as she had to catch her breath.  Last evaluated for asthma in February 2017, spirometry performed. At that time she was using her Ventolin HFA BID, sometimes more. She's still doing this now with temporary relief. She was once prescribed Advair in the past per the Asthma clinic which caused dry throat and caused increased shortness of breath. She's currently taking Xyzal which is no longer helping with symptoms or right ear popping, rhinorrhea, post nasal drip. She was once managed on Singulair in the past but this became infective, no use in over 2 years.   She denies chest pain, palpitations, wheezing, cough.   Review of Systems  Constitutional: Negative for fever.  HENT: Positive for postnasal drip and rhinorrhea. Negative for congestion and sore throat.   Respiratory: Positive for chest tightness and shortness of breath. Negative for cough and wheezing.   Cardiovascular: Negative for chest pain.       Past Medical History:  Diagnosis Date  . Arthritis   . Asthma   . Environmental allergies   . Generalized anxiety disorder   . GERD (gastroesophageal reflux disease)   . Hx of colonic polyps   . Migraines      Social History   Social History  . Marital status: Single    Spouse name: N/A  . Number of children: N/A  . Years of education: N/A   Occupational History  . Not on file.   Social History Main Topics  . Smoking status: Never Smoker  . Smokeless tobacco: Never Used  . Alcohol use No  . Drug use: No  . Sexual activity: Not on file   Other  Topics Concern  . Not on file   Social History Narrative   Single.   [place]   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       Past Surgical History:  Procedure Laterality Date  . CHOLECYSTECTOMY    . DILATION AND CURETTAGE OF UTERUS    . NECK SURGERY     Fusion to C6 and C7    No family history on file.  Allergies  Allergen Reactions  . Advair Diskus [Fluticasone-Salmeterol]     Current Outpatient Prescriptions on File Prior to Visit  Medication Sig Dispense Refill  . docusate sodium (COLACE) 100 MG capsule Take 100 mg by mouth daily.    Marland Kitchen. esomeprazole (NEXIUM) 40 MG capsule TAKE ONE CAPSULE BY MOUTH TWICE DAILY 180 capsule 2  . hydrOXYzine (VISTARIL) 50 MG capsule TAKE 1 CAPSULE BY MOUTH THREE TIMES DAILY AS NEEDED FOR ANXIETY 90 capsule 0  . Lactobacillus Rhamnosus, GG, (CULTURELLE PO) Take 1 capsule by mouth daily.    Marland Kitchen. levocetirizine (XYZAL) 5 MG tablet TAKE ONE TABLET BY MOUTH ONCE DAILY IN THE EVENING 90 tablet 0  . Multiple Vitamin (MULTIVITAMIN) capsule Take 1 capsule by mouth daily.    Marland Kitchen. oxaprozin (DAYPRO) 600 MG tablet Take 600 mg by mouth daily.  1  . VENTOLIN HFA 108 (90 Base) MCG/ACT inhaler Inhale 1-2 puffs into  the lungs every 4 (four) hours as needed. 1 Inhaler 1  . vitamin B-12 (CYANOCOBALAMIN) 1000 MCG tablet Take 1,000 mcg by mouth daily.    . vitamin C (ASCORBIC ACID) 500 MG tablet Take 500 mg by mouth daily.     No current facility-administered medications on file prior to visit.     BP 116/80   Pulse 84   Temp 98.1 F (36.7 C) (Oral)   Ht 5\' 7"  (1.702 m)   Wt 211 lb 6.4 oz (95.9 kg)   LMP 09/26/2016   SpO2 98%   BMI 33.11 kg/m    Objective:   Physical Exam  Constitutional: She appears well-nourished.  HENT:  Right Ear: Tympanic membrane and ear canal normal.  Left Ear: Tympanic membrane and ear canal normal.  Nose: Right sinus exhibits no maxillary sinus tenderness and no frontal sinus tenderness. Left sinus exhibits no maxillary sinus  tenderness and no frontal sinus tenderness.  Mouth/Throat: Oropharynx is clear and moist.  Eyes: Conjunctivae are normal.  Neck: Neck supple.  Cardiovascular: Normal rate and regular rhythm.   Pulmonary/Chest: Effort normal and breath sounds normal. She has no wheezes. She has no rales.  Several episodes of yawning during visit today.   Lymphadenopathy:    She has no cervical adenopathy.  Skin: Skin is warm and dry.          Assessment & Plan:

## 2016-10-01 NOTE — Patient Instructions (Addendum)
Start Flovent inhaler. Inhale 2 puffs into the lungs twice daily, everyday.  Use the Ventolin inhaler only as needed for shortness of breath.   Stop Xyzal. Start Singulair 10 mg tablets for asthma and allergies. Take 1 tablet by mouth at bedtime.  Please call me in 2 weeks if no improvement in symptoms.  It was a pleasure to see you today!

## 2016-10-01 NOTE — Assessment & Plan Note (Addendum)
Moderate, persistent, uncontrolled. Highly suspect this is causing her symptoms of shortness of breath. Exam today unremarkable, no wheezing. She's using her Ventolin too often, discussed this with her. Will trial ICS with Flovent inhaler. Rx for Singulair 10 mg sent to pharmacy. Stop Xyzal. She will update in 2 weeks.  ECG completed today: NSR rate of 69. No T-waves, ST elevation, PAC/PVC. No prior ECG to compare. Do not suspect cardiac cause of symptoms.

## 2016-10-05 ENCOUNTER — Encounter: Payer: Self-pay | Admitting: Primary Care

## 2016-10-05 NOTE — Telephone Encounter (Signed)
Can you release the ECG and respond back to [name]?

## 2016-10-05 NOTE — Telephone Encounter (Signed)
It has been already released and patient had viewed this morning.

## 2016-11-05 ENCOUNTER — Encounter: Payer: Self-pay | Admitting: Primary Care

## 2016-11-05 DIAGNOSIS — J4540 Moderate persistent asthma, uncomplicated: Secondary | ICD-10-CM

## 2016-11-05 MED ORDER — FLUTICASONE PROPIONATE HFA 110 MCG/ACT IN AERO: 2.0000 | INHALATION_SPRAY | Freq: Two times a day (BID) | RESPIRATORY_TRACT | 1 refills | Status: DC

## 2016-11-12 ENCOUNTER — Other Ambulatory Visit: Payer: Self-pay | Admitting: Primary Care

## 2016-11-12 DIAGNOSIS — E559 Vitamin D deficiency, unspecified: Secondary | ICD-10-CM

## 2016-11-12 DIAGNOSIS — E785 Hyperlipidemia, unspecified: Secondary | ICD-10-CM

## 2016-11-23 ENCOUNTER — Other Ambulatory Visit (INDEPENDENT_AMBULATORY_CARE_PROVIDER_SITE_OTHER): Payer: BLUE CROSS/BLUE SHIELD

## 2016-11-23 DIAGNOSIS — E559 Vitamin D deficiency, unspecified: Secondary | ICD-10-CM | POA: Diagnosis not present

## 2016-11-23 DIAGNOSIS — E785 Hyperlipidemia, unspecified: Secondary | ICD-10-CM | POA: Diagnosis not present

## 2016-11-23 LAB — COMPREHENSIVE METABOLIC PANEL
ALK PHOS: 42 U/L (ref 39–117)
ALT: 11 U/L (ref 0–35)
AST: 12 U/L (ref 0–37)
Albumin: 4.1 g/dL (ref 3.5–5.2)
BUN: 12 mg/dL (ref 6–23)
CO2: 30 meq/L (ref 19–32)
Calcium: 9.3 mg/dL (ref 8.4–10.5)
Chloride: 102 mEq/L (ref 96–112)
Creatinine, Ser: 0.8 mg/dL (ref 0.40–1.20)
GFR: 83.46 mL/min (ref >60.00)
GLUCOSE: 85 mg/dL (ref 70–99)
POTASSIUM: 4.1 meq/L (ref 3.5–5.1)
Sodium: 138 mEq/L (ref 135–145)
TOTAL PROTEIN: 6.5 g/dL (ref 6.0–8.3)
Total Bilirubin: 0.7 mg/dL (ref 0.2–1.2)

## 2016-11-23 LAB — LIPID PANEL
Cholesterol: 232 mg/dL — ABNORMAL HIGH (ref 0–200)
HDL: 49.8 mg/dL (ref >39.00)
NonHDL: 182.35
TRIGLYCERIDES: 230 mg/dL — AB (ref 0.0–149.0)
Total CHOL/HDL Ratio: 5
VLDL: 46 mg/dL — AB (ref 0.0–40.0)

## 2016-11-23 LAB — LDL CHOLESTEROL, DIRECT: Direct LDL: 146 mg/dL

## 2016-11-23 LAB — VITAMIN D 25 HYDROXY (VIT D DEFICIENCY, FRACTURES): VITD: 27.13 ng/mL — AB (ref 30.00–100.00)

## 2016-11-29 ENCOUNTER — Encounter: Payer: Self-pay | Admitting: Primary Care

## 2016-11-29 ENCOUNTER — Ambulatory Visit (INDEPENDENT_AMBULATORY_CARE_PROVIDER_SITE_OTHER): Payer: BLUE CROSS/BLUE SHIELD | Admitting: Primary Care

## 2016-11-29 VITALS — BP 120/76 | HR 75 | Temp 98.2°F | Ht 67.0 in | Wt 212.0 lb

## 2016-11-29 DIAGNOSIS — E785 Hyperlipidemia, unspecified: Secondary | ICD-10-CM | POA: Insufficient documentation

## 2016-11-29 DIAGNOSIS — J4540 Moderate persistent asthma, uncomplicated: Secondary | ICD-10-CM

## 2016-11-29 DIAGNOSIS — K219 Gastro-esophageal reflux disease without esophagitis: Secondary | ICD-10-CM

## 2016-11-29 DIAGNOSIS — Z0000 Encounter for general adult medical examination without abnormal findings: Secondary | ICD-10-CM | POA: Diagnosis not present

## 2016-11-29 DIAGNOSIS — F411 Generalized anxiety disorder: Secondary | ICD-10-CM | POA: Diagnosis not present

## 2016-11-29 MED ORDER — HYDROXYZINE PAMOATE 50 MG PO CAPS: 50.0000 mg | ORAL_CAPSULE | Freq: Two times a day (BID) | ORAL | 3 refills | Status: DC | PRN

## 2016-11-29 MED ORDER — ESOMEPRAZOLE MAGNESIUM 40 MG PO CPDR: 40.0000 mg | DELAYED_RELEASE_CAPSULE | Freq: Two times a day (BID) | ORAL | 3 refills | Status: DC

## 2016-11-29 MED ORDER — LEVOCETIRIZINE DIHYDROCHLORIDE 5 MG PO TABS
ORAL_TABLET | ORAL | 3 refills | Status: DC
Start: 2016-11-29 — End: 2017-05-27

## 2016-11-29 NOTE — Assessment & Plan Note (Signed)
Above goal on recent labs, doesn't think she was fasting. Previously prescribed statin which caused hypertension. Will have her work on diet and exercise. Recheck lipids in 6 months.

## 2016-11-29 NOTE — Patient Instructions (Signed)
It's important to improve your diet by reducing consumption of fast food, fried food, processed snack foods, sugary drinks. Increase consumption of fresh vegetables and fruits, whole grains, water.  Ensure you are drinking 64 ounces of water daily.  Start exercising. You should be getting 150 minutes of moderate intensity exercise weekly.  Start Vitamin D 1000 units once daily.  Start Fish Oil 1000 mg once daily with a meal.  Schedule a lab only appointment in 6 months to recheck your cholesterol. Make sure you don't eat 8 hours prior.   Follow up in 1 year or sooner if needed.  It was a pleasure to see you today!   Cholesterol Cholesterol is a white, waxy, fat-like substance that is needed by the human body in small amounts. The liver makes all the cholesterol we need. Cholesterol is carried from the liver by the blood through the blood vessels. Deposits of cholesterol (plaques) may build up on blood vessel (artery) walls. Plaques make the arteries narrower and stiffer. Cholesterol plaques increase the risk for heart attack and stroke. You cannot feel your cholesterol level even if it is very high. The only way to know that it is high is to have a blood test. Once you know your cholesterol levels, you should keep a record of the test results. Work with your health care provider to keep your levels in the desired range. What do the results mean?  Total cholesterol is a rough measure of all the cholesterol in your blood.  LDL (low-density lipoprotein) is the "bad" cholesterol. This is the type that causes plaque to build up on the artery walls. You want this level to be low.  HDL (high-density lipoprotein) is the "good" cholesterol because it cleans the arteries and carries the LDL away. You want this level to be high.  Triglycerides are fat that the body can either burn for energy or store. High levels are closely linked to heart disease. What are the desired levels of  cholesterol?  Total cholesterol below 200.  LDL below 100 for people who are at risk, below 70 for people at very high risk.  HDL above 40 is good. A level of 60 or higher is considered to be protective against heart disease.  Triglycerides below 150. How can I lower my cholesterol? Diet Follow your diet program as told by your health care provider.  Choose fish or white meat chicken and [place], roasted or baked. Limit fatty cuts of red meat, fried foods, and processed meats, such as sausage and lunch meats.  Eat lots of fresh fruits and vegetables.  Choose whole grains, beans, pasta, potatoes, and cereals.  Choose olive oil, corn oil, or canola oil, and use only small amounts.  Avoid butter, mayonnaise, shortening, or palm kernel oils.  Avoid foods with trans fats.  Drink skim or nonfat milk and eat low-fat or nonfat yogurt and cheeses. Avoid whole milk, cream, ice cream, egg yolks, and full-fat cheeses.  Healthier desserts include angel food cake, ginger snaps, animal crackers, hard candy, popsicles, and low-fat or nonfat frozen yogurt. Avoid pastries, cakes, pies, and cookies.  Exercise  Follow your exercise program as told by your health care provider. A regular program: ? Helps to decrease LDL and raise HDL. ? Helps with weight control.  Do things that increase your activity level, such as gardening, walking, and taking the stairs.  Ask your health care provider about ways that you can be more active in your daily life.  Medicine  Take over-the-counter and prescription medicines only as told by your health care provider. ? Medicine may be prescribed by your health care provider to help lower cholesterol and decrease the risk for heart disease. This is usually done if diet and exercise have failed to bring down cholesterol levels. ? If you have several risk factors, you may need medicine even if your levels are normal.  This information is not intended to replace  advice given to you by your health care provider. Make sure you discuss any questions you have with your health care provider. Document Released: 11/07/2000 Document Revised: 09/10/2015 Document Reviewed: 08/13/2015 Elsevier Interactive Patient Education  2017 [employer].

## 2016-11-29 NOTE — Assessment & Plan Note (Signed)
Doing well on hydroxyzine HS for sleep, continue same. Didn't do well on on Fluoxetine.

## 2016-11-29 NOTE — Assessment & Plan Note (Signed)
Doing well on Nexium.

## 2016-11-29 NOTE — Assessment & Plan Note (Signed)
Improved on Flovent, has not purchased the higher dose but plans on doing this soon. Not taking Singulair, went back to Xyzal as this works better. Using Ventolin 3-4 times weekly.

## 2016-11-29 NOTE — Progress Notes (Signed)
Subjective:    Patient ID: Hannah Klein, female    DOB: [birth year], 41 y.o.   MRN: [id number]  HPI  Hannah Klein is a 41 year old female who presents today for complete physical.  Immunizations: -Tetanus: Completed within 10 years. -Influenza: Declines   Diet: She endorses a fair diet. Breakfast: Cereal  Lunch: [employer], sandwiches, canned soup, canned veggies, salad Dinner: Skips sometimes, left overs from lunch  Snacks: None Desserts: None Beverages: Grape water, occasional sweet tea  Exercise: She is not regularly exercising. Eye exam: Has not completed recently. Dental exam: Completes regularly  Pap Smear: Completed in 2017, normal   Review of Systems  Constitutional: Negative for unexpected weight change.  HENT: Negative for rhinorrhea.   Respiratory: Negative for cough and shortness of breath.   Cardiovascular: Negative for chest pain.  Gastrointestinal: Negative for constipation and diarrhea.  Genitourinary: Negative for difficulty urinating and menstrual problem.  Musculoskeletal: Negative for arthralgias and myalgias.  Skin: Negative for rash.  Allergic/Immunologic: Negative for environmental allergies.  Neurological: Negative for dizziness, numbness and headaches.  Psychiatric/Behavioral:       Using hydroxyzine at bedtime for sleep, overall anxiety has decreased.       Past Medical History:  Diagnosis Date  . Arthritis   . Asthma   . Environmental allergies   . Generalized anxiety disorder   . GERD (gastroesophageal reflux disease)   . Hx of colonic polyps   . Migraines      Social History   Social History  . Marital status: Single    Spouse name: N/A  . Number of children: N/A  . Years of education: N/A   Occupational History  . Not on file.   Social History Main Topics  . Smoking status: Never Smoker  . Smokeless tobacco: Never Used  . Alcohol use No  . Drug use: No  . Sexual activity: Not on file   Other Topics  Concern  . Not on file   Social History Narrative   Single.   Moved from [place].   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       Past Surgical History:  Procedure Laterality Date  . CHOLECYSTECTOMY    . DILATION AND CURETTAGE OF UTERUS    . NECK SURGERY     Fusion to C6 and C7    No family history on file.  Allergies  Allergen Reactions  . Advair Diskus [Fluticasone-Salmeterol]     Current Outpatient Prescriptions on File Prior to Visit  Medication Sig Dispense Refill  . docusate sodium (COLACE) 100 MG capsule Take 100 mg by mouth daily.    . fluticasone (FLOVENT HFA) 110 MCG/ACT inhaler Inhale 2 puffs into the lungs 2 (two) times daily. 1 Inhaler 1  . Multiple Vitamin (MULTIVITAMIN) capsule Take 1 capsule by mouth daily.    . vitamin B-12 (CYANOCOBALAMIN) 1000 MCG tablet Take 1,000 mcg by mouth daily.    . vitamin C (ASCORBIC ACID) 500 MG tablet Take 500 mg by mouth daily.    . VENTOLIN HFA 108 (90 Base) MCG/ACT inhaler Inhale 1-2 puffs into the lungs every 4 (four) hours as needed. (Patient not taking: Reported on 11/29/2016) 1 Inhaler 1   No current facility-administered medications on file prior to visit.     BP 120/76   Pulse 75   Temp 98.2 F (36.8 C) (Oral)   Ht  (1.702 m)   Wt 212 lb (96.2 kg)   LMP 11/13/2016   SpO2 98%  BMI 33.20 kg/m    Objective:   Physical Exam  Constitutional: She is oriented to person, place, and time. She appears well-nourished.  HENT:  Right Ear: Tympanic membrane and ear canal normal.  Left Ear: Tympanic membrane and ear canal normal.  Nose: Nose normal.  Mouth/Throat: Oropharynx is clear and moist.  Eyes: Pupils are equal, round, and reactive to light. Conjunctivae and EOM are normal.  Neck: Neck supple. No thyromegaly present.  Cardiovascular: Normal rate and regular rhythm.   No murmur heard. Pulmonary/Chest: Effort normal and breath sounds normal. She has no rales.  Abdominal: Soft. Bowel sounds are normal. There is no  tenderness.  Musculoskeletal: Normal range of motion.  Lymphadenopathy:    She has no cervical adenopathy.  Neurological: She is alert and oriented to person, place, and time. She has normal reflexes. No cranial nerve deficit.  Skin: Skin is warm and dry. No rash noted.  Psychiatric: She has a normal mood and affect.          Assessment & Plan:

## 2016-11-29 NOTE — Assessment & Plan Note (Signed)
Td UTD, declines influenza vaccination. Pap UTD. Mammogram UTD. Discussed the importance of a healthy diet and regular exercise in order for weight loss, and to reduce the risk of other medical problems. Exam unremarkable. Labs with hyperlipidemia, was not fasting prior to labs. Repeat lipids in 6 months. Follow up in 1 year.

## 2017-02-04 ENCOUNTER — Other Ambulatory Visit: Payer: Self-pay | Admitting: Primary Care

## 2017-02-04 DIAGNOSIS — F411 Generalized anxiety disorder: Secondary | ICD-10-CM

## 2017-05-24 ENCOUNTER — Other Ambulatory Visit: Payer: Self-pay | Admitting: Primary Care

## 2017-05-24 DIAGNOSIS — E785 Hyperlipidemia, unspecified: Secondary | ICD-10-CM

## 2017-05-26 ENCOUNTER — Encounter: Payer: Self-pay | Admitting: Primary Care

## 2017-05-26 DIAGNOSIS — J4540 Moderate persistent asthma, uncomplicated: Secondary | ICD-10-CM

## 2017-05-27 MED ORDER — LEVOCETIRIZINE DIHYDROCHLORIDE 5 MG PO TABS: ORAL_TABLET | ORAL | 0 refills | Status: DC

## 2017-05-30 ENCOUNTER — Other Ambulatory Visit (INDEPENDENT_AMBULATORY_CARE_PROVIDER_SITE_OTHER): Payer: BLUE CROSS/BLUE SHIELD

## 2017-05-30 DIAGNOSIS — E785 Hyperlipidemia, unspecified: Secondary | ICD-10-CM | POA: Diagnosis not present

## 2017-05-30 LAB — LIPID PANEL
Cholesterol: 251 mg/dL — ABNORMAL HIGH (ref 0–200)
HDL: 51.1 mg/dL (ref >39.00)
LDL Cholesterol: 166 mg/dL — ABNORMAL HIGH (ref 0–99)
NONHDL: 199.9
Total CHOL/HDL Ratio: 5
Triglycerides: 169 mg/dL — ABNORMAL HIGH (ref 0.0–149.0)
VLDL: 33.8 mg/dL (ref 0.0–40.0)

## 2017-08-01 ENCOUNTER — Ambulatory Visit: Payer: BLUE CROSS/BLUE SHIELD | Admitting: Family Medicine

## 2017-08-01 ENCOUNTER — Ambulatory Visit (INDEPENDENT_AMBULATORY_CARE_PROVIDER_SITE_OTHER)
Admission: RE | Admit: 2017-08-01 | Discharge: 2017-08-01 | Disposition: A | Discharge status: Home / Self Care | Payer: BLUE CROSS/BLUE SHIELD | Source: Ambulatory Visit | Attending: Family Medicine | Admitting: Family Medicine

## 2017-08-01 ENCOUNTER — Encounter: Payer: Self-pay | Admitting: Family Medicine

## 2017-08-01 VITALS — BP 112/74 | HR 61 | Temp 98.8°F | Ht 67.0 in | Wt 214.0 lb

## 2017-08-01 DIAGNOSIS — M25532 Pain in left wrist: Secondary | ICD-10-CM

## 2017-08-01 DIAGNOSIS — M79644 Pain in right finger(s): Secondary | ICD-10-CM | POA: Diagnosis not present

## 2017-08-01 NOTE — Progress Notes (Signed)
Dr. Karleen Hampshire T. Ruba Outen, MD, CAQ Sports Medicine Primary Care and Sports Medicine [address] [place], [address] Phone: [phone] Fax: [phone]  08/01/2017  [name], MRN: [id number], DOB: [birth year], 42 y.o.  Primary Physician:  Doreene Nest, NP   Chief Complaint  Patient presents with  . Wrist Pain    Left  . Hand Pain    Right Index Knuckle   Subjective:   Hannah Klein is a 42 y.o. very pleasant female patient who presents with the following:  L wrist and when turned it a certain way it would hurt a lot. Brace and wrist wrap. Now swollen in the wrist joint.  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  She was using a wheelbarrow and doing some other physical things, and she felt some pain when her left wrist twisted.  Subsequent to this, she placed her wrist in a carpal tunnel type splint, and she has had some significant swelling as well as some pain on the ulnar aspect.  This is been worsened in the last couple of days due to activity.  She also has some pain on the right index finger knuckle.  No trauma.  Supination lift + Ulnar tfcc  Past Medical History, Surgical History, Social History, Family History, Problem List, Medications, and Allergies have been reviewed and updated if relevant.  Patient Active Problem List   Diagnosis Date Noted  . Hyperlipidemia 11/29/2016  . Preventative health care 11/23/2015  . Constipation 11/23/2015  . Generalized anxiety disorder 04/26/2015  . Right-sided low back pain with right-sided sciatica 04/26/2015  . Asthma 04/26/2015  . Frequent headaches 04/26/2015  . Osteoarthritis 04/26/2015  . GERD (gastroesophageal reflux disease) 04/26/2015    Past Medical History:  Diagnosis Date  . Arthritis   . Asthma   . Environmental allergies   . Generalized anxiety disorder   . GERD  (gastroesophageal reflux disease)   . Hx of colonic polyps   . Migraines     Past Surgical History:  Procedure Laterality Date  . CHOLECYSTECTOMY    . DILATION AND CURETTAGE OF UTERUS    . NECK SURGERY     Fusion to C6 and C7    Social History   Socioeconomic History  . Marital status: Single    Spouse name: Not on file  . Number of children: Not on file  . Years of education: Not on file  . Highest education level: Not on file  Occupational History  . Not on file  Social Needs  . Financial resource strain: Not on file  . Food insecurity:    Worry: Not on file    Inability: Not on file  . Transportation needs:    Medical: Not on file    Non-medical: Not on file  Tobacco Use  . Smoking status: Never Smoker  . Smokeless tobacco: Never Used  Substance and Sexual Activity  . Alcohol use: No    Alcohol/week: 0.0 oz  . Drug use: No  . Sexual activity: Not on file  Lifestyle  . Physical activity:    Days per week: Not on file    Minutes per session: Not on file  . Stress: Not on file  Relationships  . Social connections:    Talks on phone: Not on file    Gets together: Not on file    Attends religious  service: Not on file    Active member of club or organization: Not on file    Attends meetings of clubs or organizations: Not on file    Relationship status: Not on file  . Intimate partner violence:    Fear of current or ex partner: Not on file    Emotionally abused: Not on file    Physically abused: Not on file    Forced sexual activity: Not on file  Other Topics Concern  . Not on file  Social History Narrative   Single.   Moved from [place].   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    History reviewed. No pertinent family history.  Allergies  Allergen Reactions  . Advair Diskus [Fluticasone-Salmeterol]     Medication list reviewed and updated in full in [facility].  GEN: No fevers, chills. Nontoxic. Primarily MSK c/o today. MSK: Detailed in the HPI GI:  tolerating PO intake without difficulty Neuro: No numbness, parasthesias, or tingling associated. Otherwise the pertinent positives of the ROS are noted above.   Objective:   BP 112/74   Pulse 61   Temp 98.8 F (37.1 C) (Oral)   Ht 5\' 7"  (1.702 m)   Wt 214 lb (97.1 kg)   LMP 07/17/2017   BMI 33.52 kg/m    GEN: WDWN, NAD, Non-toxic, Alert & Oriented x 3 HEENT: Atraumatic, Normocephalic.  Ears and Nose: No external deformity. EXTR: No clubbing/cyanosis/edema NEURO: Normal gait.  PSYCH: Normally interactive. Conversant. Not depressed or anxious appearing.  Calm demeanor.   Hand: L Ecchymosis or edema: neg ROM wrist/hand/digits/elbow: moderate restriction Carpals, MCP's, digits: NT Distal Ulna and Radius: tender distal to the ulna in the tfcc region Supination lift test: + Ecchymosis or edema: neg Cysts/nodules: neg Finkelstein's test: neg Snuffbox tenderness: neg Scaphoid tubercle: NT Hook of Hamate: NT Resisted supination: pain Full composite fist Grip, all digits: 5/5 str No tenosynovitis Axial load test: neg Phalen's: neg Tinel's: neg Atrophy: neg  Hand sensation: intact   Radiology: Dg Wrist Complete Left  Result Date: 08/01/2017 CLINICAL DATA:  Left wrist pain and swelling following an injury 2 weeks ago. EXAM: LEFT WRIST - COMPLETE 3+ VIEW COMPARISON:  None. FINDINGS: There is no evidence of fracture or dislocation. There is no evidence of arthropathy or other focal bone abnormality. Soft tissues are unremarkable. IMPRESSION: Normal examination. Electronically Signed   By: Beckie Salts M.D.   On: 08/01/2017 17:43  Is a is is is full is is question Thursday go is here remember so  Assessment and Plan:   Left wrist pain - Plan: DG Wrist Complete Left  Finger pain, right  No fracture, but wrist internal derangement is suspected.  Partial or full scapholunate tear is possible.  TFCC tear is possible.  For now I am going to place the patient in a forearm  splint and have her rest this virtually all of the time for the next few weeks and then reexamine her.  His symptoms persist, we can do a wrist injection.  We could also potentially do an MR arthrogram of the left wrist if she continues to do very poorly over time.  The right finger appears grossly normal and is not concerning at all.  Follow-up: Return in about 3 weeks (around 08/22/2017).  Orders Placed This Encounter  Procedures  . DG Wrist Complete Left    Signed,  Bee Marchiano T. Laiba Fuerte, MD   Allergies as of 08/01/2017      Reactions   Advair Diskus [  fluticasone-salmeterol]       Medication List        Accurate as of 08/01/17 11:59 PM. Always use your most recent med list.          docusate sodium 100 MG capsule Commonly known as:  COLACE Take 100 mg by mouth daily.   esomeprazole 40 MG capsule Commonly known as:  NEXIUM Take 1 capsule (40 mg total) by mouth 2 (two) times daily.   fluticasone 110 MCG/ACT inhaler Commonly known as:  FLOVENT HFA Inhale 2 puffs into the lungs 2 (two) times daily.   hydrOXYzine 50 MG capsule Commonly known as:  VISTARIL TAKE 1 CAPSULE BY MOUTH THREE TIMES DAILY AS NEEDED FOR ANXIETY   levocetirizine 5 MG tablet Commonly known as:  XYZAL Take 1 tablet by mouth once to twice daily as needed for allergies.   multivitamin capsule Take 1 capsule by mouth daily.   TUMERSAID PO Take 500 mg by mouth daily.   VENTOLIN HFA 108 (90 Base) MCG/ACT inhaler Generic drug:  albuterol Inhale 1-2 puffs into the lungs every 4 (four) hours as needed.   vitamin B-12 1000 MCG tablet Commonly known as:  CYANOCOBALAMIN Take 1,000 mcg by mouth daily.   vitamin C 500 MG tablet Commonly known as:  ASCORBIC ACID Take 500 mg by mouth daily.

## 2017-08-25 NOTE — Progress Notes (Signed)
Dr. Karleen Hampshire T. Jayon Matton, MD, CA[facility] Sports Medicine Primary Care and Sports Medicine [address] [place], [address] Phone: [phone] Fax: [phone]  08/26/2017  [name], MRN: [id number], DOB: 05/05/1975, 42 y.o.  Primary Physician:  Doreene Nest, NP   Chief Complaint  Patient presents with  . Follow-up    Left Wrist   Subjective:   Hannah Klein is a 42 y.o. very pleasant female patient who presents with the following:  Pleasant young lady who I saw for a hand injury on August 01, 2017.  At that point, placed her in a forearm splint and functionally immobilize it for the next few weeks, and then follow-up with me in approximately 3 weeks.  Not better.  She is well-known, she had an injury described above, and she returns today in follow-up, but she is not improved at all.  She still has persistent pain around the entirety of the wrist.  She has pain with flexion and extension, she has pain on the medial and lateral aspect of the true wrist.  She also has some pain volarly.  She also has some minor complaints at the second MCP joint on the right.  This is not swollen, bruised, she has had no trauma.  Past Medical History, Surgical History, Social History, Family History, Problem List, Medications, and Allergies have been reviewed and updated if relevant.  Patient Active Problem List   Diagnosis Date Noted  . Hyperlipidemia 11/29/2016  . Preventative health care 11/23/2015  . Constipation 11/23/2015  . Generalized anxiety disorder 04/26/2015  . Right-sided low back pain with right-sided sciatica 04/26/2015  . Asthma 04/26/2015  . Frequent headaches 04/26/2015  . Osteoarthritis 04/26/2015  . GERD (gastroesophageal reflux disease) 04/26/2015    Past Medical History:  Diagnosis Date  . Arthritis   . Asthma   . Environmental allergies   . Generalized anxiety disorder   . GERD (gastroesophageal reflux disease)   . Hx of colonic polyps   .  Migraines     Past Surgical History:  Procedure Laterality Date  . CHOLECYSTECTOMY    . DILATION AND CURETTAGE OF UTERUS    . NECK SURGERY     Fusion to C6 and C7    Social History   Socioeconomic History  . Marital status: Single    Spouse name: Not on file  . Number of children: Not on file  . Years of education: Not on file  . Highest education level: Not on file  Occupational History  . Not on file  Social Needs  . Financial resource strain: Not on file  . Food insecurity:    Worry: Not on file    Inability: Not on file  . Transportation needs:    Medical: Not on file    Non-medical: Not on file  Tobacco Use  . Smoking status: Never Smoker  . Smokeless tobacco: Never Used  Substance and Sexual Activity  . Alcohol use: No    Alcohol/week: 0.0 oz  . Drug use: No  . Sexual activity: Not on file  Lifestyle  . Physical activity:    Days per week: Not on file    Minutes per session: Not on file  . Stress: Not on file  Relationships  . Social connections:    Talks on phone: Not on file    Gets together: Not on file    Attends religious service: Not on file    Active member of club or organization: Not on  file    Attends meetings of clubs or organizations: Not on file    Relationship status: Not on file  . Intimate partner violence:    Fear of current or ex partner: Not on file    Emotionally abused: Not on file    Physically abused: Not on file    Forced sexual activity: Not on file  Other Topics Concern  . Not on file  Social History Narrative   Single.   Moved from [place].   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    History reviewed. No pertinent family history.  Allergies  Allergen Reactions  . Advair Diskus [Fluticasone-Salmeterol]     Medication list reviewed and updated in full in [facility].  GEN: No fevers, chills. Nontoxic. Primarily MSK c/o today. MSK: Detailed in the HPI GI: tolerating PO intake without difficulty Neuro: No numbness,  parasthesias, or tingling associated. Otherwise the pertinent positives of the ROS are noted above.   Objective:   BP 110/70   Pulse 70   Temp 98.3 F (36.8 C) (Oral)   Ht 5\' 7"  (1.702 m)   Wt 208 lb 8 oz (94.6 kg)   BMI 32.66 kg/m    GEN: WDWN, NAD, Non-toxic, Alert & Oriented x 3 HEENT: Atraumatic, Normocephalic.  Ears and Nose: No external deformity. EXTR: No clubbing/cyanosis/edema NEURO: Normal gait.  PSYCH: Normally interactive. Conversant. Not depressed or anxious appearing.  Calm demeanor.   L hand Ecchymosis or edema: mild dorsal fullness ROM wrist/hand/digits: 35% loss of motion in all directions Carpals, MCP's, digits: carpals ttp Distal Ulna and Radius: mild ttp all Ecchymosis or edema: neg No instability Cysts/nodules: neg Digit triggering: neg Finkelstein's test: neg Snuffbox tenderness: neg Scaphoid tubercle: ttp Resisted supination: tender Full composite fist, no malrotation Grip, all digits: 5/5 str DIPJT: NT PIP JT: NT MCP JT: NT No tenosynovitis Axial load test: pos Phalen's: neg Tinel's: neg Atrophy: neg  Hand sensation: intact   Radiology: Dg Wrist Complete Left  Result Date: 08/01/2017 CLINICAL DATA:  Left wrist pain and swelling following an injury 2 weeks ago. EXAM: LEFT WRIST - COMPLETE 3+ VIEW COMPARISON:  None. FINDINGS: There is no evidence of fracture or dislocation. There is no evidence of arthropathy or other focal bone abnormality. Soft tissues are unremarkable. IMPRESSION: Normal examination. Electronically Signed   By: Beckie Salts M.D.   On: 08/01/2017 17:43    Assessment and Plan:   Other specific joint derangements of left wrist, not elsewhere classified - Plan: MR WRIST LEFT W CONTRAST, DG FLUORO GUIDED NEEDLE PLC ASPIRATION/INJECTION LOC  Left wrist pain - Plan: MR WRIST LEFT W CONTRAST, DG FLUORO GUIDED NEEDLE PLC ASPIRATION/INJECTION LOC  Significant pain, unimproved with 1 month conservative care, she has been  splinted throughout this time.  Concern for internal derangement, scapholunate dissociation based on prior x-ray.  Questionable widening of the scapholunate based on my interpretation.  Scaphoid fracture occult cannot be excluded.  Other carpal fracture occult, cannot be excluded.  TFCC tear cannot be excluded.  Obtain an MR arthrogram of the left wrist to evaluate for all of the above.  Further disposition depends on the above.  Follow-up: No follow-ups on file.  Meds ordered this encounter  Medications  . diazepam (VALIUM) 2 MG tablet    Sig: Take 1 tab po 1 hour before MRI, repeat if needed    Dispense:  2 tablet    Refill:  0   Orders Placed This Encounter  Procedures  . MR  WRIST LEFT W CONTRAST  . DG FLUORO GUIDED NEEDLE PLC ASPIRATION/INJECTION LOC    Signed,  Chawn Spraggins T. Briena Swingler, MD   Allergies as of 08/26/2017      Reactions   Advair Diskus [fluticasone-salmeterol]       Medication List        Accurate as of 08/26/17  8:41 AM. Always use your most recent med list.          diazepam 2 MG tablet Commonly known as:  VALIUM Take 1 tab po 1 hour before MRI, repeat if needed   docusate sodium 100 MG capsule Commonly known as:  COLACE Take 100 mg by mouth daily.   esomeprazole 40 MG capsule Commonly known as:  NEXIUM Take 1 capsule (40 mg total) by mouth 2 (two) times daily.   fluticasone 110 MCG/ACT inhaler Commonly known as:  FLOVENT HFA Inhale 2 puffs into the lungs 2 (two) times daily.   hydrOXYzine 50 MG capsule Commonly known as:  VISTARIL TAKE 1 CAPSULE BY MOUTH THREE TIMES DAILY AS NEEDED FOR ANXIETY   levocetirizine 5 MG tablet Commonly known as:  XYZAL Take 1 tablet by mouth once to twice daily as needed for allergies.   multivitamin capsule Take 1 capsule by mouth daily.   TUMERSAID PO Take 500 mg by mouth daily.   VENTOLIN HFA 108 (90 Base) MCG/ACT inhaler Generic drug:  albuterol Inhale 1-2 puffs into the lungs every 4 (four) hours as  needed.   vitamin B-12 1000 MCG tablet Commonly known as:  CYANOCOBALAMIN Take 1,000 mcg by mouth daily.   vitamin C 500 MG tablet Commonly known as:  ASCORBIC ACID Take 500 mg by mouth daily.

## 2017-08-26 ENCOUNTER — Ambulatory Visit: Payer: BLUE CROSS/BLUE SHIELD | Admitting: Family Medicine

## 2017-08-26 ENCOUNTER — Encounter: Payer: Self-pay | Admitting: Family Medicine

## 2017-08-26 VITALS — BP 110/70 | HR 70 | Temp 98.3°F | Ht 67.0 in | Wt 208.5 lb

## 2017-08-26 DIAGNOSIS — M24832 Other specific joint derangements of left wrist, not elsewhere classified: Secondary | ICD-10-CM | POA: Diagnosis not present

## 2017-08-26 DIAGNOSIS — M25532 Pain in left wrist: Secondary | ICD-10-CM | POA: Diagnosis not present

## 2017-08-26 MED ORDER — DIAZEPAM 2 MG PO TABS: ORAL_TABLET | ORAL | 0 refills | Status: DC

## 2017-09-02 ENCOUNTER — Encounter: Payer: Self-pay | Admitting: Primary Care

## 2017-09-02 DIAGNOSIS — K219 Gastro-esophageal reflux disease without esophagitis: Secondary | ICD-10-CM

## 2017-09-02 DIAGNOSIS — J4520 Mild intermittent asthma, uncomplicated: Secondary | ICD-10-CM

## 2017-09-02 DIAGNOSIS — J4540 Moderate persistent asthma, uncomplicated: Secondary | ICD-10-CM

## 2017-09-02 DIAGNOSIS — F411 Generalized anxiety disorder: Secondary | ICD-10-CM

## 2017-09-03 MED ORDER — ESOMEPRAZOLE MAGNESIUM 40 MG PO CPDR: 40.0000 mg | DELAYED_RELEASE_CAPSULE | Freq: Two times a day (BID) | ORAL | 2 refills | Status: DC

## 2017-09-03 MED ORDER — VENTOLIN HFA 108 (90 BASE) MCG/ACT IN AERS: 1.0000 | INHALATION_SPRAY | RESPIRATORY_TRACT | 1 refills | Status: DC | PRN

## 2017-09-03 MED ORDER — LEVOCETIRIZINE DIHYDROCHLORIDE 5 MG PO TABS: ORAL_TABLET | ORAL | 2 refills | Status: DC

## 2017-09-03 MED ORDER — HYDROXYZINE PAMOATE 50 MG PO CAPS: ORAL_CAPSULE | ORAL | 0 refills | Status: DC

## 2017-09-05 ENCOUNTER — Ambulatory Visit
Admission: RE | Admit: 2017-09-05 | Discharge: 2017-09-05 | Disposition: A | Discharge status: Home / Self Care | Payer: BLUE CROSS/BLUE SHIELD | Source: Ambulatory Visit | Attending: Family Medicine | Admitting: Family Medicine

## 2017-09-05 DIAGNOSIS — M25532 Pain in left wrist: Secondary | ICD-10-CM

## 2017-09-05 DIAGNOSIS — M24832 Other specific joint derangements of left wrist, not elsewhere classified: Secondary | ICD-10-CM

## 2017-09-05 MED ORDER — IOPAMIDOL (ISOVUE-M 200) INJECTION 41%
2.0000 mL | Freq: Once | INTRAMUSCULAR | Status: AC
  Administered 2017-09-05: 2 mL via INTRA_ARTICULAR

## 2017-09-09 ENCOUNTER — Encounter: Payer: Self-pay | Admitting: Family Medicine

## 2017-09-09 ENCOUNTER — Other Ambulatory Visit: Payer: Self-pay | Admitting: Family Medicine

## 2017-09-09 DIAGNOSIS — M25832 Other specified joint disorders, left wrist: Secondary | ICD-10-CM

## 2017-09-09 DIAGNOSIS — M25532 Pain in left wrist: Secondary | ICD-10-CM

## 2017-09-09 DIAGNOSIS — S6982XD Other specified injuries of left wrist, hand and finger(s), subsequent encounter: Secondary | ICD-10-CM

## 2017-09-09 NOTE — Progress Notes (Signed)
done

## 2017-09-09 NOTE — Telephone Encounter (Signed)
Pt is calling to check status of mri results. [id number]#[phone]#[phone]

## 2017-09-30 ENCOUNTER — Other Ambulatory Visit: Payer: Self-pay | Admitting: Primary Care

## 2017-09-30 DIAGNOSIS — F411 Generalized anxiety disorder: Secondary | ICD-10-CM

## 2017-10-01 ENCOUNTER — Other Ambulatory Visit: Payer: Self-pay | Admitting: Primary Care

## 2017-10-01 DIAGNOSIS — F411 Generalized anxiety disorder: Secondary | ICD-10-CM

## 2017-10-04 ENCOUNTER — Other Ambulatory Visit: Payer: Self-pay | Admitting: Primary Care

## 2017-10-04 DIAGNOSIS — F411 Generalized anxiety disorder: Secondary | ICD-10-CM

## 2017-10-29 ENCOUNTER — Other Ambulatory Visit: Payer: Self-pay | Admitting: Primary Care

## 2017-10-29 DIAGNOSIS — F411 Generalized anxiety disorder: Secondary | ICD-10-CM

## 2017-11-08 ENCOUNTER — Other Ambulatory Visit: Payer: Self-pay | Admitting: Primary Care

## 2017-11-08 DIAGNOSIS — F411 Generalized anxiety disorder: Secondary | ICD-10-CM

## 2017-11-08 NOTE — Telephone Encounter (Signed)
Does she really need a refill? 90 tabs were prescribed nearly one month ago. Needs CPE for further refills of her medication, please schedule.

## 2017-11-08 NOTE — Telephone Encounter (Signed)
Name of Medication: Hydroxyzine 50 mg [facility] of [facility] [place] Last [place] or Written Date and Quantity:#90 on 10/02/17  Last Office Visit and Type: 11/29/16 annual Next Office Visit and Type: none

## 2017-11-08 NOTE — Telephone Encounter (Signed)
Copied from CRM [id number]. Topic: Quick Communication - Rx Refill/Question >> Nov 08, 2017  4:56 PM Alexander BergeronBarksdale, Harvey B wrote: Medication: hydrOXYzine (VISTARIL) 50 MG capsule [[id number]][[id number]]   Has the patient contacted their pharmacy? Yes.   (Agent: If no, request that the patient contact the pharmacy for the refill.) (Agent: If yes, when and what did the pharmacy advise?)  [facility] phone [facility]  Agent: Please be advised that RX refills may take up to 3 business days. We ask that you follow-up with your pharmacy.

## 2017-11-11 ENCOUNTER — Encounter: Payer: Self-pay | Admitting: Family Medicine

## 2017-11-11 ENCOUNTER — Ambulatory Visit: Payer: Self-pay | Admitting: *Deleted

## 2017-11-11 ENCOUNTER — Ambulatory Visit: Payer: BLUE CROSS/BLUE SHIELD | Admitting: Family Medicine

## 2017-11-11 VITALS — BP 140/84 | HR 60 | Temp 98.3°F | Ht 67.0 in | Wt 201.8 lb

## 2017-11-11 DIAGNOSIS — G44209 Tension-type headache, unspecified, not intractable: Secondary | ICD-10-CM

## 2017-11-11 DIAGNOSIS — F411 Generalized anxiety disorder: Secondary | ICD-10-CM

## 2017-11-11 DIAGNOSIS — I10 Essential (primary) hypertension: Secondary | ICD-10-CM

## 2017-11-11 MED ORDER — HYDROCHLOROTHIAZIDE 12.5 MG PO TABS: 12.5000 mg | ORAL_TABLET | Freq: Every day | ORAL | 3 refills | Status: DC

## 2017-11-11 MED ORDER — HYDROXYZINE PAMOATE 50 MG PO CAPS: 50.0000 mg | ORAL_CAPSULE | Freq: Three times a day (TID) | ORAL | 0 refills | Status: DC | PRN

## 2017-11-11 NOTE — Telephone Encounter (Signed)
This was refilled by Dr Patsy Lageropland on an acute appt on 11/11/2017

## 2017-11-11 NOTE — Telephone Encounter (Signed)
Noted and agree. We will see her for CPE in November as scheduled.

## 2017-11-11 NOTE — Progress Notes (Signed)
Dr. Karleen Hampshire T. Harli Engelken, MD, CA[facility] Sports Medicine Primary Care and Sports Medicine [address] [place], [address] Phone: [phone] Fax: [phone]  11/11/2017  [name], MRN: [id number], DOB: 1975-04-05, 42 y.o.  Primary Physician:  Doreene Nest, NP   Chief Complaint  Patient presents with  . Blood pressure has been elevated    headaches for about a week   Subjective:   Hannah Klein is a 42 y.o. very pleasant female patient who presents with the following:  HA for a week. No h/o migraine. No trauma.   HTN: Newer onset elevated BP's No CP, no sob. No HA. BP's 130 - 170's / 89 - 119  150/85  Panic attacks and anxiety - taking hydroxazine bid, sometimes tid - helping a lot right now  Past Medical History, Surgical History, Social History, Family History, Problem List, Medications, and Allergies have been reviewed and updated if relevant.  Patient Active Problem List   Diagnosis Date Noted  . Hyperlipidemia 11/29/2016  . Preventative health care 11/23/2015  . Constipation 11/23/2015  . Generalized anxiety disorder 04/26/2015  . Right-sided low back pain with right-sided sciatica 04/26/2015  . Asthma 04/26/2015  . Frequent headaches 04/26/2015  . Osteoarthritis 04/26/2015  . GERD (gastroesophageal reflux disease) 04/26/2015    Past Medical History:  Diagnosis Date  . Arthritis   . Asthma   . Environmental allergies   . Generalized anxiety disorder   . GERD (gastroesophageal reflux disease)   . Hx of colonic polyps   . Migraines     Past Surgical History:  Procedure Laterality Date  . CHOLECYSTECTOMY    . DILATION AND CURETTAGE OF UTERUS    . NECK SURGERY     Fusion to C6 and C7    Social History   Socioeconomic History  . Marital status: Single    Spouse name: Not on file  . Number of children: Not on file  . Years of education: Not on file  . Highest education level: Not on file  Occupational History  . Not  on file  Social Needs  . Financial resource strain: Not on file  . Food insecurity:    Worry: Not on file    Inability: Not on file  . Transportation needs:    Medical: Not on file    Non-medical: Not on file  Tobacco Use  . Smoking status: Never Smoker  . Smokeless tobacco: Never Used  Substance and Sexual Activity  . Alcohol use: No    Alcohol/week: 0.0 standard drinks  . Drug use: No  . Sexual activity: Not on file  Lifestyle  . Physical activity:    Days per week: Not on file    Minutes per session: Not on file  . Stress: Not on file  Relationships  . Social connections:    Talks on phone: Not on file    Gets together: Not on file    Attends religious service: Not on file    Active member of club or organization: Not on file    Attends meetings of clubs or organizations: Not on file    Relationship status: Not on file  . Intimate partner violence:    Fear of current or ex partner: Not on file    Emotionally abused: Not on file    Physically abused: Not on file    Forced sexual activity: Not on file  Other Topics Concern  . Not on file  Social History Narrative  Single.   [place]   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    No family history on file.  Allergies  Allergen Reactions  . Advair Diskus [Fluticasone-Salmeterol]     Medication list reviewed and updated in full in [facility].   GEN: No acute illnesses, no fevers, chills. GI: No n/v/d, eating normally Pulm: No SOB Interactive and getting along well at home.  Otherwise, ROS is as per the HPI.  Objective:   BP 140/84   Pulse 60   Temp 98.3 F (36.8 C) (Oral)   Ht 5\' 7"  (1.702 m)   Wt 201 lb 12 oz (91.5 kg)   LMP 11/06/2017   SpO2 98%   BMI 31.60 kg/m   GEN: WDWN, NAD, Non-toxic, A & O x 3 HEENT: Atraumatic, Normocephalic. Neck supple. No masses, No LAD. Ears and Nose: No external deformity. CV: RRR, No M/G/R. No JVD. No thrill. No extra heart sounds. PULM: CTA B, no  wheezes, crackles, rhonchi. No retractions. No resp. distress. No accessory muscle use. EXTR: No c/c/e NEURO Normal gait.  PSYCH: Normally interactive. Conversant. Not depressed or anxious appearing.  Calm demeanor.   Laboratory and Imaging Data:  Assessment and Plan:   Essential hypertension  GAD (generalized anxiety disorder) - Plan: hydrOXYzine (VISTARIL) 50 MG capsule  Acute non intractable tension-type headache  New onset HTN, start hctz Suspect playing a role in HA  Motrin, tylenol, caffeine for acute ha  Refill vistaril  Follow-up: Return in about 1 month (around 12/11/2017) for CPX with Mayra ReelKate Clark.  Meds ordered this encounter  Medications  . hydrochlorothiazide (HYDRODIURIL) 12.5 MG tablet    Sig: Take 1 tablet (12.5 mg total) by mouth daily.    Dispense:  30 tablet    Refill:  3  . hydrOXYzine (VISTARIL) 50 MG capsule    Sig: Take 1 capsule (50 mg total) by mouth 3 (three) times daily as needed.    Dispense:  90 capsule    Refill:  0   Signed,  Harlis Champoux T. Leilanny Fluitt, MD   Allergies as of 11/11/2017      Reactions   Advair Diskus [fluticasone-salmeterol]       Medication List        Accurate as of 11/11/17  2:38 PM. Always use your most recent med list.          diazepam 2 MG tablet Commonly known as:  VALIUM Take 1 tab po 1 hour before MRI, repeat if needed   docusate sodium 100 MG capsule Commonly known as:  COLACE Take 100 mg by mouth daily.   esomeprazole 40 MG capsule Commonly known as:  NEXIUM Take 1 capsule (40 mg total) by mouth 2 (two) times daily.   fluticasone 110 MCG/ACT inhaler Commonly known as:  FLOVENT HFA Inhale 2 puffs into the lungs 2 (two) times daily.   hydrochlorothiazide 12.5 MG tablet Commonly known as:  HYDRODIURIL Take 1 tablet (12.5 mg total) by mouth daily.   hydrOXYzine 50 MG capsule Commonly known as:  VISTARIL Take 1 capsule (50 mg total) by mouth 3 (three) times daily as needed.   levocetirizine 5 MG  tablet Commonly known as:  XYZAL Take 1 tablet by mouth once to twice daily as needed for allergies.   meloxicam 7.5 MG tablet Commonly known as:  MOBIC Take 7.5 mg by mouth daily.   multivitamin capsule Take 1 capsule by mouth daily.   NON FORMULARY CBD Oil /vaping for back pain daily  TUMERSAID PO Take 500 mg by mouth daily.   VENTOLIN HFA 108 (90 Base) MCG/ACT inhaler Generic drug:  albuterol Inhale 1-2 puffs into the lungs every 4 (four) hours as needed.   vitamin B-12 1000 MCG tablet Commonly known as:  CYANOCOBALAMIN Take 1,000 mcg by mouth daily.   vitamin C 500 MG tablet Commonly known as:  ASCORBIC ACID Take 500 mg by mouth daily.

## 2017-11-11 NOTE — Telephone Encounter (Signed)
Pt has complaints of a headache for a week; her BP 11/10/17 at 1900; 172/119 at 1945; 156/98 at 2300; 146/100 at 0720 on 11/11/17; she also complaints of nausea, and her face feels hot on the inside; she says that she took Excedrin migraine and her pain lessened; she currently rates her headache pain as 4.5 out of 10; recommendations made per nurse triage protocol; the pt would like to be seen in the office today; pt normally sees Vernona RiegerKatherine Clark but she has no availability; pt offered and accepted appointment with Dr Patsy [place], [facility] 11/11/17 at 1200; will route to office for notification of this upcoming appointment. Reason for Disposition . Systolic BP  >= 160 OR Diastolic >= 100  Answer Assessment - Initial Assessment Questions 1. BLOOD PRESSURE: "What is the blood pressure?" "Did you take at least two measurements 5 minutes apart?"   11/10/17 at 1900; 172/119 at 1945; 156/98 at 2300; 146/100 at 0720 on 11/11/17   2. ONSET: "When did you take your blood pressure?"  multiple times on 11/10/17 and 11/11/17 3. HOW: "How did you obtain the blood pressure?" (e.g., visiting nurse, automatic home BP monitor)     Home automatic cuff on right upper arm 4. HISTORY: "Do you have a history of high blood pressure?"     Yes when on cholesterol medicine 5. MEDICATIONS: "Are you taking any medications for blood pressure?" "Have you missed any doses recently?"     no 6. OTHER SYMPTOMS: "Do you have any symptoms?" (e.g., headache, chest pain, blurred vision, difficulty breathing, weakness)    Headache, nausea, chest heavy (pt has asthma)- hard to take a deep breath; must yawn to get a deep breath 7. PREGNANCY: "Is there any chance you are pregnant?" "When was your last menstrual period?"     No LMP 11/06/17  Protocols used: HIGH BLOOD PRESSURE-A-AH

## 2017-11-12 ENCOUNTER — Ambulatory Visit: Payer: BLUE CROSS/BLUE SHIELD | Admitting: Primary Care

## 2017-11-27 ENCOUNTER — Other Ambulatory Visit: Payer: Self-pay | Admitting: Primary Care

## 2017-11-27 DIAGNOSIS — J4540 Moderate persistent asthma, uncomplicated: Secondary | ICD-10-CM

## 2017-12-04 ENCOUNTER — Other Ambulatory Visit: Payer: Self-pay | Admitting: Family Medicine

## 2017-12-04 DIAGNOSIS — F411 Generalized anxiety disorder: Secondary | ICD-10-CM

## 2017-12-04 NOTE — Telephone Encounter (Signed)
Last office visit 11/11/17 for HTN with Dr. Patsy Lager. CPE scheduled with [facility] 01/09/2018.  Last refilled 11/11/2017 for #90 with no refills by Dr. Patsy Lager.  Ok to refill?

## 2017-12-04 NOTE — Telephone Encounter (Signed)
Is she actually needing a refill? How often is she taking the medication?

## 2017-12-06 DIAGNOSIS — J4540 Moderate persistent asthma, uncomplicated: Secondary | ICD-10-CM

## 2017-12-06 MED ORDER — LEVOCETIRIZINE DIHYDROCHLORIDE 5 MG PO TABS: ORAL_TABLET | ORAL | 0 refills | Status: DC

## 2017-12-06 NOTE — Telephone Encounter (Signed)
Message left for patient to return my call.  

## 2017-12-13 NOTE — Telephone Encounter (Signed)
Patient reply to MyChart message 

## 2017-12-13 NOTE — Telephone Encounter (Addendum)
Message left for patient to return my call. Also sent a message thru MyChart 

## 2017-12-27 DIAGNOSIS — F411 Generalized anxiety disorder: Secondary | ICD-10-CM

## 2017-12-29 ENCOUNTER — Other Ambulatory Visit: Payer: Self-pay | Admitting: Family Medicine

## 2017-12-29 DIAGNOSIS — F411 Generalized anxiety disorder: Secondary | ICD-10-CM

## 2017-12-30 MED ORDER — HYDROXYZINE PAMOATE 50 MG PO CAPS: 50.0000 mg | ORAL_CAPSULE | Freq: Three times a day (TID) | ORAL | 0 refills | Status: DC | PRN

## 2017-12-30 NOTE — Telephone Encounter (Signed)
Refilled earlier. Duplicate.

## 2017-12-30 NOTE — Telephone Encounter (Signed)
Last office visit 11/11/2017 with Dr. Patsy Lager for HTN.  Last refilled 11/11/2017 for #90 with no refills.  CPE scheduled 01/09/2018.  Ok to refill?

## 2017-12-31 ENCOUNTER — Other Ambulatory Visit: Payer: Self-pay | Admitting: Primary Care

## 2017-12-31 DIAGNOSIS — I10 Essential (primary) hypertension: Secondary | ICD-10-CM

## 2017-12-31 DIAGNOSIS — E785 Hyperlipidemia, unspecified: Secondary | ICD-10-CM

## 2018-01-06 ENCOUNTER — Other Ambulatory Visit (INDEPENDENT_AMBULATORY_CARE_PROVIDER_SITE_OTHER): Payer: BLUE CROSS/BLUE SHIELD

## 2018-01-06 DIAGNOSIS — I10 Essential (primary) hypertension: Secondary | ICD-10-CM | POA: Diagnosis not present

## 2018-01-06 DIAGNOSIS — E785 Hyperlipidemia, unspecified: Secondary | ICD-10-CM

## 2018-01-06 LAB — LIPID PANEL
Cholesterol: 250 mg/dL — ABNORMAL HIGH (ref 0–200)
HDL: 50.5 mg/dL (ref >39.00)
NONHDL: 199.58
Total CHOL/HDL Ratio: 5
Triglycerides: 223 mg/dL — ABNORMAL HIGH (ref 0.0–149.0)
VLDL: 44.6 mg/dL — AB (ref 0.0–40.0)

## 2018-01-06 LAB — COMPREHENSIVE METABOLIC PANEL
ALBUMIN: 4.6 g/dL (ref 3.5–5.2)
ALT: 13 U/L (ref 0–35)
AST: 12 U/L (ref 0–37)
Alkaline Phosphatase: 42 U/L (ref 39–117)
BUN: 18 mg/dL (ref 6–23)
CHLORIDE: 102 meq/L (ref 96–112)
CO2: 27 mEq/L (ref 19–32)
Calcium: 9.6 mg/dL (ref 8.4–10.5)
Creatinine, Ser: 0.84 mg/dL (ref 0.40–1.20)
GFR: 78.47 mL/min (ref >60.00)
Glucose, Bld: 96 mg/dL (ref 70–99)
Potassium: 3.8 mEq/L (ref 3.5–5.1)
SODIUM: 138 meq/L (ref 135–145)
Total Bilirubin: 0.9 mg/dL (ref 0.2–1.2)
Total Protein: 7.2 g/dL (ref 6.0–8.3)

## 2018-01-06 LAB — LDL CHOLESTEROL, DIRECT: LDL DIRECT: 178 mg/dL

## 2018-01-08 ENCOUNTER — Other Ambulatory Visit: Payer: Self-pay | Admitting: Primary Care

## 2018-01-08 DIAGNOSIS — K219 Gastro-esophageal reflux disease without esophagitis: Secondary | ICD-10-CM

## 2018-01-09 ENCOUNTER — Encounter: Payer: Self-pay | Admitting: Primary Care

## 2018-01-09 ENCOUNTER — Ambulatory Visit (INDEPENDENT_AMBULATORY_CARE_PROVIDER_SITE_OTHER): Payer: BLUE CROSS/BLUE SHIELD | Admitting: Primary Care

## 2018-01-09 VITALS — BP 140/78 | HR 60 | Temp 98.4°F | Ht 67.0 in | Wt 213.0 lb

## 2018-01-09 DIAGNOSIS — J4540 Moderate persistent asthma, uncomplicated: Secondary | ICD-10-CM

## 2018-01-09 DIAGNOSIS — Z23 Encounter for immunization: Secondary | ICD-10-CM | POA: Diagnosis not present

## 2018-01-09 DIAGNOSIS — E785 Hyperlipidemia, unspecified: Secondary | ICD-10-CM

## 2018-01-09 DIAGNOSIS — E559 Vitamin D deficiency, unspecified: Secondary | ICD-10-CM

## 2018-01-09 DIAGNOSIS — Z1239 Encounter for other screening for malignant neoplasm of breast: Secondary | ICD-10-CM

## 2018-01-09 DIAGNOSIS — I10 Essential (primary) hypertension: Secondary | ICD-10-CM

## 2018-01-09 DIAGNOSIS — K5900 Constipation, unspecified: Secondary | ICD-10-CM

## 2018-01-09 DIAGNOSIS — F411 Generalized anxiety disorder: Secondary | ICD-10-CM

## 2018-01-09 DIAGNOSIS — K219 Gastro-esophageal reflux disease without esophagitis: Secondary | ICD-10-CM

## 2018-01-09 DIAGNOSIS — R51 Headache: Secondary | ICD-10-CM

## 2018-01-09 DIAGNOSIS — R519 Headache, unspecified: Secondary | ICD-10-CM

## 2018-01-09 DIAGNOSIS — Z0000 Encounter for general adult medical examination without abnormal findings: Secondary | ICD-10-CM

## 2018-01-09 DIAGNOSIS — M1990 Unspecified osteoarthritis, unspecified site: Secondary | ICD-10-CM | POA: Diagnosis not present

## 2018-01-09 MED ORDER — BECLOMETHASONE DIPROP HFA 80 MCG/ACT IN AERB: 1.0000 | INHALATION_SPRAY | Freq: Two times a day (BID) | RESPIRATORY_TRACT | 5 refills | Status: DC

## 2018-01-09 MED ORDER — HYDROCHLOROTHIAZIDE 12.5 MG PO TABS: 12.5000 mg | ORAL_TABLET | Freq: Every day | ORAL | 0 refills | Status: DC

## 2018-01-09 NOTE — Assessment & Plan Note (Signed)
Doing well on Nexium 40 mg, continue same. Discussed trigger foods.

## 2018-01-09 NOTE — Assessment & Plan Note (Signed)
Cannot afford Flovent. Using the albuterol inhaler 3-4 times weekly on average. Will switch to another ICS that may be more affordable. She will update.

## 2018-01-09 NOTE — Assessment & Plan Note (Signed)
Tetanus due, provided today. Declines influenza vaccination. Mammogram due, ordered. Pap smear UTD. Recommended to resume exercise, work on diet. Exam unremarkable. Labs reviewed. Follow up in 1 year for CPE.

## 2018-01-09 NOTE — Assessment & Plan Note (Signed)
No longer taking stool softener, doing well.

## 2018-01-09 NOTE — Assessment & Plan Note (Signed)
Doing well on hydroxyzine, taking 2-3 times daily. Continue same.

## 2018-01-09 NOTE — Assessment & Plan Note (Signed)
Working with a Landchiropractor and hand surgery. 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 Worse now than before, she plans on following up with hand surgery.

## 2018-01-09 NOTE — Patient Instructions (Addendum)
Start beclomethasone (Qvar) inhaler. Inhale 1 puff into the lungs twice daily. Please notify me if this is too expensive.  Resume hydrochlorothiazide 12.5 mg tablets for high blood pressure. Take 1 tablet by mouth once daily.   Start monitoring your blood pressure daily, around the same time of day, for the next 2 weeks.  Ensure that you have rested for 30 minutes prior to checking your blood pressure. Record your readings and I'll call you for them in 2 weeks.    [facility].  You were provided with a tetanus vaccination which will cover you for 10 years.   Start exercising. You should be getting 150 minutes of moderate intensity exercise weekly.  It's important to improve your diet by reducing consumption of fast food, fried food, processed snack foods, sugary drinks. Increase consumption of fresh vegetables and fruits, whole grains, water.  Ensure you are drinking 64 ounces of water daily.  Schedule a lab only appointment in 2 weeks for electrolytes and vitamin D check.  Schedule a lab only appointment in 6 months for cholesterol check.  It was a pleasure to see you today!   Preventive Care 40-64 Years, Female Preventive care refers to lifestyle choices and visits with your health care provider that can promote health and wellness. What does preventive care include?  A yearly physical exam. This is also called an annual well check.  Dental exams once or twice a year.  Routine eye exams. Ask your health care provider how often you should have your eyes checked.  Personal lifestyle choices, including: ? Daily care of your teeth and gums. ? Regular physical activity. ? Eating a healthy diet. ? Avoiding tobacco and drug use. ? Limiting alcohol use. ? Practicing safe sex. ? Taking low-dose aspirin daily starting at age 67. ? Taking vitamin and mineral supplements as recommended by your health care provider. What happens during an annual  well check? The services and screenings done by your health care provider during your annual well check will depend on your age, overall health, lifestyle risk factors, and family history of disease. Counseling Your health care provider may ask you questions about your:  Alcohol use.  Tobacco use.  Drug use.  Emotional well-being.  Home and relationship well-being.  Sexual activity.  Eating habits.  Work and work Statistician.  Method of birth control.  Menstrual cycle.  Pregnancy history.  Screening You may have the following tests or measurements:  Height, weight, and BMI.  Blood pressure.  Lipid and cholesterol levels. These may be checked every 5 years, or more frequently if you are over 99 years old.  Skin check.  Lung cancer screening. You may have this screening every year starting at age 13 if you have a 30-pack-year history of smoking and currently smoke or have quit within the past 15 years.  Fecal occult blood test (FOBT) of the stool. You may have this test every year starting at age 95.  Flexible sigmoidoscopy or colonoscopy. You may have a sigmoidoscopy every 5 years or a colonoscopy every 10 years starting at age 74.  Hepatitis C blood test.  Hepatitis B blood test.  Sexually transmitted disease (STD) testing.  Diabetes screening. This is done by checking your blood sugar (glucose) after you have not eaten for a while (fasting). You may have this done every 1-3 years.  Mammogram. This may be done every 1-2 years. Talk to your health care provider about when you should start  having regular mammograms. This may depend on whether you have a family history of breast cancer.  BRCA-related cancer screening. This may be done if you have a family history of breast, ovarian, tubal, or peritoneal cancers.  Pelvic exam and Pap test. This may be done every 3 years starting at age 26. Starting at age 20, this may be done every 5 years if you have a Pap test in  combination with an HPV test.  Bone density scan. This is done to screen for osteoporosis. You may have this scan if you are at high risk for osteoporosis.  Discuss your test results, treatment options, and if necessary, the need for more tests with your health care provider. Vaccines Your health care provider may recommend certain vaccines, such as:  Influenza vaccine. This is recommended every year.  Tetanus, diphtheria, and acellular pertussis (Tdap, Td) vaccine. You may need a Td booster every 10 years.  Varicella vaccine. You may need this if you have not been vaccinated.  Zoster vaccine. You may need this after age 41.  Measles, mumps, and rubella (MMR) vaccine. You may need at least one dose of MMR if you were born in 1957 or later. You may also need a second dose.  Pneumococcal 13-valent conjugate (PCV13) vaccine. You may need this if you have certain conditions and were not previously vaccinated.  Pneumococcal polysaccharide (PPSV23) vaccine. You may need one or two doses if you smoke cigarettes or if you have certain conditions.  Meningococcal vaccine. You may need this if you have certain conditions.  Hepatitis A vaccine. You may need this if you have certain conditions or if you travel or work in places where you may be exposed to hepatitis A.  Hepatitis B vaccine. You may need this if you have certain conditions or if you travel or work in places where you may be exposed to hepatitis B.  Haemophilus influenzae type b (Hib) vaccine. You may need this if you have certain conditions.  Talk to your health care provider about which screenings and vaccines you need and how often you need them. This information is not intended to replace advice given to you by your health care provider. Make sure you discuss any questions you have with your health care provider. Document Released: 03/11/2015 Document Revised: 11/02/2015 Document Reviewed: 12/14/2014 Elsevier Interactive Patient  Education  [employer].

## 2018-01-09 NOTE — Assessment & Plan Note (Signed)
Overall doing better, no recent headaches. Working with a Landchiropractor for neck pain. Continue to monitor.

## 2018-01-09 NOTE — Addendum Note (Signed)
Addended by: Tawnya CrookSAMBATH, Tea Collums on: 01/09/2018 09:22 AM   Modules accepted: Orders

## 2018-01-09 NOTE — Assessment & Plan Note (Signed)
No longer taking HCTZ, has not had in several months.  BP above goal again today, will resume HCTZ 12.5 mg and repeat BMP in 2-3 weeks.

## 2018-01-09 NOTE — Progress Notes (Signed)
Subjective:    Patient ID: Hannah ForehandChristina Klein, female    DOB: 1975-09-28, 42 y.o.   MRN: [id number]  HPI  Hannah Klein is a 42 year old female who presents today for complete physical.  Immunizations: -Tetanus: Due today -Influenza: Declines    Diet: She endorses a fair diet Breakfast: Skips, fruit, muffin Lunch: Fast food/restaurant food, veggies, sandwich, soup Dinner: Skips sometimes, meat, potatoes Snacks: Ice cream  Desserts: Daily  Beverages: Water, sweet tea sometimes  Exercise: She is walking with work, not currently exercising Eye exam: Completed several years ago Dental exam: Completes annually  Pap Smear: Completed in 2017 Mammogram: Completed in 2017  BP Readings from Last 3 Encounters:  01/09/18 140/78  11/11/17 140/84  08/26/17 110/70   The 10-year ASCVD risk score Denman George(Goff DC Jr., et al., 2013) is: 1.5%   Values used to calculate the score:     Age: [age] years     Sex: Female     Is Non-Hispanic African American: No     Diabetic: No     Tobacco smoker: No     Systolic Blood Pressure: 140 mmHg     Is BP treated: No     HDL Cholesterol: 50.5 mg/dL     Total Cholesterol: 250 mg/dL   Review of Systems  Constitutional: Negative for unexpected weight change.  HENT: Negative for rhinorrhea.   Respiratory: Negative for cough and shortness of breath.   Cardiovascular: Negative for chest pain.  Gastrointestinal: Negative for constipation and diarrhea.  Genitourinary: Negative for difficulty urinating and menstrual problem.  Musculoskeletal: Positive for arthralgias.  Skin: Negative for rash.  Allergic/Immunologic: Positive for environmental allergies.  Neurological: Negative for dizziness, numbness and headaches.  Psychiatric/Behavioral:       Overall doing well on current regimen       Past Medical History:  Diagnosis Date  . Arthritis   . Asthma   . Environmental allergies   . Generalized anxiety disorder   . GERD (gastroesophageal reflux disease)    . Hx of colonic polyps   . Migraines      Social History   Socioeconomic History  . Marital status: Single    Spouse name: Not on file  . Number of children: Not on file  . Years of education: Not on file  . Highest education level: Not on file  Occupational History  . Not on file  Social Needs  . Financial resource strain: Not on file  . Food insecurity:    Worry: Not on file    Inability: Not on file  . Transportation needs:    Medical: Not on file    Non-medical: Not on file  Tobacco Use  . Smoking status: Never Smoker  . Smokeless tobacco: Never Used  Substance and Sexual Activity  . Alcohol use: No    Alcohol/week: 0.0 standard drinks  . Drug use: No  . Sexual activity: Not on file  Lifestyle  . Physical activity:    Days per week: Not on file    Minutes per session: Not on file  . Stress: Not on file  Relationships  . Social connections:    Talks on phone: Not on file    Gets together: Not on file    Attends religious service: Not on file    Active member of club or organization: Not on file    Attends meetings of clubs or organizations: Not on file    Relationship status: Not on file  . Intimate  partner violence:    Fear of current or ex partner: Not on file    Emotionally abused: Not on file    Physically abused: Not on file    Forced sexual activity: Not on file  Other Topics Concern  . Not on file  Social History Narrative   Single.   Moved from [place].   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    Past Surgical History:  Procedure Laterality Date  . CHOLECYSTECTOMY    . DILATION AND CURETTAGE OF UTERUS    . NECK SURGERY     Fusion to C6 and C7    No family history on file.  Allergies  Allergen Reactions  . Advair Diskus [Fluticasone-Salmeterol]   . Lipitor [Atorvastatin Calcium] Other (See Comments)    Elevations in blood pressure?    Current Outpatient Medications on File Prior to Visit  Medication Sig Dispense Refill  . docusate sodium  (COLACE) 100 MG capsule Take 100 mg by mouth daily.    Marland Kitchen esomeprazole (NEXIUM) 40 MG capsule TAKE 1 CAPSULE BY MOUTH TWICE A DAY 60 capsule 2  . hydrOXYzine (VISTARIL) 50 MG capsule Take 1 capsule (50 mg total) by mouth 3 (three) times daily as needed. 90 capsule 0  . levocetirizine (XYZAL) 5 MG tablet Take 1 tablet by mouth once to twice daily as needed for allergies. 180 tablet 0  . meloxicam (MOBIC) 7.5 MG tablet Take 7.5 mg by mouth daily.    . Misc Natural Products (TUMERSAID PO) Take 500 mg by mouth daily.    . Multiple Vitamin (MULTIVITAMIN) capsule Take 1 capsule by mouth daily.    . NON FORMULARY CBD Oil /vaping for back pain daily    . VENTOLIN HFA 108 (90 Base) MCG/ACT inhaler Inhale 1-2 puffs into the lungs every 4 (four) hours as needed. 18 g 1  . vitamin B-12 (CYANOCOBALAMIN) 1000 MCG tablet Take 1,000 mcg by mouth daily.    . vitamin C (ASCORBIC ACID) 500 MG tablet Take 500 mg by mouth daily.    . fluticasone (FLOVENT HFA) 110 MCG/ACT inhaler Inhale 2 puffs into the lungs 2 (two) times daily. (Patient not taking: Reported on 11/11/2017) 1 Inhaler 1   No current facility-administered medications on file prior to visit.     BP 140/78   Pulse 60   Temp 98.4 F (36.9 C) (Oral)   Ht 5\' 7"  (1.702 m)   Wt 213 lb (96.6 kg)   LMP 12/22/2017   SpO2 99%   BMI 33.36 kg/m    Objective:   Physical Exam  Constitutional: She is oriented to person, place, and time. She appears well-nourished.  HENT:  Mouth/Throat: No oropharyngeal exudate.  Eyes: Pupils are equal, round, and reactive to light. EOM are normal.  Neck: Neck supple. No thyromegaly present.  Cardiovascular: Normal rate and regular rhythm.  Respiratory: Effort normal and breath sounds normal.  GI: Soft. Bowel sounds are normal. There is no tenderness.  Musculoskeletal: Normal range of motion.  Neurological: She is alert and oriented to person, place, and time.  Skin: Skin is warm and dry.  Psychiatric: She has a  normal mood and affect.           Assessment & Plan:

## 2018-01-09 NOTE — Assessment & Plan Note (Signed)
Above goal on recent labs, weight gain since last visit. Encouraged her to work on lifestyle changes. Repeat lipids in 6 months.

## 2018-01-22 ENCOUNTER — Other Ambulatory Visit (INDEPENDENT_AMBULATORY_CARE_PROVIDER_SITE_OTHER): Payer: BLUE CROSS/BLUE SHIELD

## 2018-01-22 DIAGNOSIS — I10 Essential (primary) hypertension: Secondary | ICD-10-CM

## 2018-01-22 DIAGNOSIS — E559 Vitamin D deficiency, unspecified: Secondary | ICD-10-CM

## 2018-01-22 LAB — BASIC METABOLIC PANEL
BUN: 22 mg/dL (ref 6–23)
CHLORIDE: 103 meq/L (ref 96–112)
CO2: 25 meq/L (ref 19–32)
CREATININE: 0.83 mg/dL (ref 0.40–1.20)
Calcium: 9.7 mg/dL (ref 8.4–10.5)
GFR: 79.55 mL/min (ref >60.00)
Glucose, Bld: 103 mg/dL — ABNORMAL HIGH (ref 70–99)
POTASSIUM: 3.9 meq/L (ref 3.5–5.1)
Sodium: 136 mEq/L (ref 135–145)

## 2018-01-22 LAB — VITAMIN D 25 HYDROXY (VIT D DEFICIENCY, FRACTURES): VITD: 32.01 ng/mL (ref 30.00–100.00)

## 2018-01-24 ENCOUNTER — Other Ambulatory Visit: Payer: Self-pay | Admitting: Primary Care

## 2018-01-24 DIAGNOSIS — F411 Generalized anxiety disorder: Secondary | ICD-10-CM

## 2018-02-13 ENCOUNTER — Other Ambulatory Visit: Payer: Self-pay | Admitting: Orthopedic Surgery

## 2018-02-14 ENCOUNTER — Encounter (HOSPITAL_BASED_OUTPATIENT_CLINIC_OR_DEPARTMENT_OTHER): Payer: Self-pay | Admitting: *Deleted

## 2018-02-14 ENCOUNTER — Other Ambulatory Visit: Payer: Self-pay

## 2018-02-20 ENCOUNTER — Encounter (HOSPITAL_BASED_OUTPATIENT_CLINIC_OR_DEPARTMENT_OTHER)
Admission: RE | Admit: 2018-02-20 | Discharge: 2018-02-20 | Disposition: A | Discharge status: Home / Self Care | Payer: BLUE CROSS/BLUE SHIELD | Source: Ambulatory Visit | Attending: Orthopedic Surgery | Admitting: Orthopedic Surgery

## 2018-02-20 DIAGNOSIS — Z01818 Encounter for other preprocedural examination: Secondary | ICD-10-CM | POA: Insufficient documentation

## 2018-02-20 LAB — BASIC METABOLIC PANEL
Anion gap: 10 (ref 5–15)
BUN: 15 mg/dL (ref 6–20)
CO2: 23 mmol/L (ref 22–32)
Calcium: 9.1 mg/dL (ref 8.9–10.3)
Chloride: 106 mmol/L (ref 98–111)
Creatinine, Ser: 0.84 mg/dL (ref 0.44–1.00)
GFR calc Af Amer: >60 mL/min (ref >60)
GFR calc non Af Amer: >60 mL/min (ref >60)
Glucose, Bld: 113 mg/dL — ABNORMAL HIGH (ref 70–99)
Potassium: 3.9 mmol/L (ref 3.5–5.1)
Sodium: 139 mmol/L (ref 135–145)

## 2018-02-27 ENCOUNTER — Ambulatory Visit (HOSPITAL_BASED_OUTPATIENT_CLINIC_OR_DEPARTMENT_OTHER)
Admission: RE | Admit: 2018-02-27 | Discharge: 2018-02-27 | Disposition: A | Discharge status: Home / Self Care | Payer: BLUE CROSS/BLUE SHIELD | Attending: Orthopedic Surgery | Admitting: Orthopedic Surgery

## 2018-02-27 ENCOUNTER — Encounter (HOSPITAL_BASED_OUTPATIENT_CLINIC_OR_DEPARTMENT_OTHER): Payer: Self-pay

## 2018-02-27 ENCOUNTER — Ambulatory Visit (HOSPITAL_BASED_OUTPATIENT_CLINIC_OR_DEPARTMENT_OTHER): Payer: BLUE CROSS/BLUE SHIELD | Admitting: Anesthesiology

## 2018-02-27 ENCOUNTER — Encounter (HOSPITAL_BASED_OUTPATIENT_CLINIC_OR_DEPARTMENT_OTHER)
Admission: RE | Disposition: A | Discharge status: Home / Self Care | Payer: Self-pay | Source: Home / Self Care | Attending: Orthopedic Surgery

## 2018-02-27 ENCOUNTER — Other Ambulatory Visit: Payer: Self-pay

## 2018-02-27 DIAGNOSIS — M8788 Other osteonecrosis, other site: Secondary | ICD-10-CM | POA: Diagnosis not present

## 2018-02-27 DIAGNOSIS — I10 Essential (primary) hypertension: Secondary | ICD-10-CM | POA: Insufficient documentation

## 2018-02-27 DIAGNOSIS — M1990 Unspecified osteoarthritis, unspecified site: Secondary | ICD-10-CM | POA: Insufficient documentation

## 2018-02-27 DIAGNOSIS — Z888 Allergy status to other drugs, medicaments and biological substances status: Secondary | ICD-10-CM | POA: Insufficient documentation

## 2018-02-27 DIAGNOSIS — Z791 Long term (current) use of non-steroidal anti-inflammatories (NSAID): Secondary | ICD-10-CM | POA: Diagnosis not present

## 2018-02-27 DIAGNOSIS — M6588 Other synovitis and tenosynovitis, other site: Secondary | ICD-10-CM | POA: Diagnosis not present

## 2018-02-27 DIAGNOSIS — Z8614 Personal history of Methicillin resistant Staphylococcus aureus infection: Secondary | ICD-10-CM | POA: Insufficient documentation

## 2018-02-27 DIAGNOSIS — Z79899 Other long term (current) drug therapy: Secondary | ICD-10-CM | POA: Insufficient documentation

## 2018-02-27 DIAGNOSIS — J45909 Unspecified asthma, uncomplicated: Secondary | ICD-10-CM | POA: Insufficient documentation

## 2018-02-27 DIAGNOSIS — M24132 Other articular cartilage disorders, left wrist: Secondary | ICD-10-CM | POA: Diagnosis not present

## 2018-02-27 DIAGNOSIS — Z87891 Personal history of nicotine dependence: Secondary | ICD-10-CM | POA: Insufficient documentation

## 2018-02-27 DIAGNOSIS — K219 Gastro-esophageal reflux disease without esophagitis: Secondary | ICD-10-CM | POA: Diagnosis not present

## 2018-02-27 DIAGNOSIS — M25532 Pain in left wrist: Secondary | ICD-10-CM | POA: Diagnosis present

## 2018-02-27 HISTORY — DX: Essential (primary) hypertension: I10

## 2018-02-27 HISTORY — DX: Methicillin resistant Staphylococcus aureus infection, unspecified site: A49.02

## 2018-02-27 HISTORY — PX: WRIST ARTHROSCOPY WITH DEBRIDEMENT: SHX6194

## 2018-02-27 SURGERY — WRIST ARTHROSCOPY WITH DEBRIDEMENT
Anesthesia: Regional | Site: Wrist | Laterality: Left

## 2018-02-27 MED ORDER — LIDOCAINE 2% (20 MG/ML) 5 ML SYRINGE
INTRAMUSCULAR | Status: AC
  Filled 2018-02-27: qty 5

## 2018-02-27 MED ORDER — DEXAMETHASONE SODIUM PHOSPHATE 4 MG/ML IJ SOLN
INTRAMUSCULAR | Status: DC | PRN
  Administered 2018-02-27: 10 mg via INTRAVENOUS

## 2018-02-27 MED ORDER — FENTANYL CITRATE (PF) 100 MCG/2ML IJ SOLN
50.0000 ug | INTRAMUSCULAR | Status: DC | PRN
  Administered 2018-02-27: 50 ug via INTRAVENOUS

## 2018-02-27 MED ORDER — HYDROCODONE-ACETAMINOPHEN 5-325 MG PO TABS: ORAL_TABLET | ORAL | 0 refills | Status: DC

## 2018-02-27 MED ORDER — BUPIVACAINE HCL (PF) 0.25 % IJ SOLN
INTRAMUSCULAR | Status: AC
  Filled 2018-02-27: qty 90

## 2018-02-27 MED ORDER — ONDANSETRON HCL 4 MG/2ML IJ SOLN
INTRAMUSCULAR | Status: DC | PRN
  Administered 2018-02-27: 4 mg via INTRAVENOUS

## 2018-02-27 MED ORDER — MIDAZOLAM HCL 2 MG/2ML IJ SOLN
1.0000 mg | INTRAMUSCULAR | Status: DC | PRN
  Administered 2018-02-27: 2 mg via INTRAVENOUS

## 2018-02-27 MED ORDER — DEXAMETHASONE SODIUM PHOSPHATE 10 MG/ML IJ SOLN
INTRAMUSCULAR | Status: AC
  Filled 2018-02-27: qty 1

## 2018-02-27 MED ORDER — LIDOCAINE-EPINEPHRINE (PF) 1 %-1:200000 IJ SOLN
INTRAMUSCULAR | Status: AC
  Filled 2018-02-27: qty 30

## 2018-02-27 MED ORDER — FENTANYL CITRATE (PF) 100 MCG/2ML IJ SOLN
INTRAMUSCULAR | Status: AC
  Filled 2018-02-27: qty 2

## 2018-02-27 MED ORDER — PROPOFOL 500 MG/50ML IV EMUL
INTRAVENOUS | Status: AC
  Filled 2018-02-27: qty 50

## 2018-02-27 MED ORDER — LACTATED RINGERS IV SOLN
INTRAVENOUS | Status: DC
  Administered 2018-02-27: 08:00:00 via INTRAVENOUS

## 2018-02-27 MED ORDER — LIDOCAINE HCL (PF) 1 % IJ SOLN
INTRAMUSCULAR | Status: AC
  Filled 2018-02-27: qty 30

## 2018-02-27 MED ORDER — SCOPOLAMINE 1 MG/3DAYS TD PT72: 1.0000 | MEDICATED_PATCH | Freq: Once | TRANSDERMAL | Status: DC | PRN

## 2018-02-27 MED ORDER — CLONIDINE HCL (ANALGESIA) 100 MCG/ML EP SOLN
EPIDURAL | Status: DC | PRN
  Administered 2018-02-27: 50 ug

## 2018-02-27 MED ORDER — CEFAZOLIN SODIUM-DEXTROSE 2-4 GM/100ML-% IV SOLN
INTRAVENOUS | Status: AC
  Filled 2018-02-27: qty 100

## 2018-02-27 MED ORDER — CEFAZOLIN SODIUM-DEXTROSE 2-4 GM/100ML-% IV SOLN
2.0000 g | INTRAVENOUS | Status: AC
  Administered 2018-02-27: 2 g via INTRAVENOUS

## 2018-02-27 MED ORDER — CHLORHEXIDINE GLUCONATE 4 % EX LIQD: 60.0000 mL | Freq: Once | CUTANEOUS | Status: DC

## 2018-02-27 MED ORDER — ROPIVACAINE HCL 7.5 MG/ML IJ SOLN
INTRAMUSCULAR | Status: DC | PRN
  Administered 2018-02-27: 20 mL via PERINEURAL

## 2018-02-27 MED ORDER — ONDANSETRON HCL 4 MG/2ML IJ SOLN
INTRAMUSCULAR | Status: AC
  Filled 2018-02-27: qty 2

## 2018-02-27 MED ORDER — LIDOCAINE-EPINEPHRINE 1 %-1:100000 IJ SOLN
INTRAMUSCULAR | Status: AC
  Filled 2018-02-27: qty 1

## 2018-02-27 MED ORDER — FENTANYL CITRATE (PF) 100 MCG/2ML IJ SOLN: 25.0000 ug | INTRAMUSCULAR | Status: DC | PRN

## 2018-02-27 MED ORDER — MIDAZOLAM HCL 2 MG/2ML IJ SOLN
INTRAMUSCULAR | Status: AC
  Filled 2018-02-27: qty 2

## 2018-02-27 MED ORDER — PROPOFOL 10 MG/ML IV BOLUS
INTRAVENOUS | Status: DC | PRN
  Administered 2018-02-27: 200 mg via INTRAVENOUS

## 2018-02-27 MED ORDER — LIDOCAINE 2% (20 MG/ML) 5 ML SYRINGE
INTRAMUSCULAR | Status: DC | PRN
  Administered 2018-02-27: 40 mg via INTRAVENOUS

## 2018-02-27 SURGICAL SUPPLY — 82 items
BANDAGE ACE 3X5.8 VEL STRL LF (GAUZE/BANDAGES/DRESSINGS) ×2 IMPLANT
BANDAGE ACE 4X5 VEL STRL LF (GAUZE/BANDAGES/DRESSINGS) ×2 IMPLANT
BLADE CUDA 2.0 (BLADE) IMPLANT
BLADE EAR TYMPAN 2.5 60D BEAV (BLADE) IMPLANT
BLADE MINI RND TIP GREEN BEAV (BLADE) IMPLANT
BLADE SURG 15 STRL LF DISP TIS (BLADE) ×2 IMPLANT
BLADE SURG 15 STRL SS (BLADE) ×2
BNDG ESMARK 4X9 LF (GAUZE/BANDAGES/DRESSINGS) ×1 IMPLANT
BNDG GAUZE ELAST 4 BULKY (GAUZE/BANDAGES/DRESSINGS) ×2 IMPLANT
BUR CUDA 2.9 (BURR) ×1 IMPLANT
BUR FULL RADIUS 2.0 (BURR) ×1 IMPLANT
BUR FULL RADIUS 2.9 (BURR) IMPLANT
BUR GATOR 2.9 (BURR) IMPLANT
BUR SPHERICAL 2.9 (BURR) IMPLANT
CANISTER SUCT 1200ML W/VALVE (MISCELLANEOUS) IMPLANT
CHLORAPREP W/TINT 26ML (MISCELLANEOUS) ×2 IMPLANT
CORD BIPOLAR FORCEPS 12FT (ELECTRODE) IMPLANT
COVER BACK TABLE 60X90IN (DRAPES) ×2 IMPLANT
COVER WAND RF STERILE (DRAPES) IMPLANT
CUFF TOURNIQUET SINGLE 18IN (TOURNIQUET CUFF) ×2 IMPLANT
DRAPE EXTREMITY T 121X128X90 (DISPOSABLE) ×2 IMPLANT
DRAPE IMP U-DRAPE 54X76 (DRAPES) ×4 IMPLANT
DRAPE OEC MINIVIEW 54X84 (DRAPES) IMPLANT
DRAPE SURG 17X23 STRL (DRAPES) ×2 IMPLANT
ELECT SMALL JOINT 90D BASC (ELECTRODE) IMPLANT
GAUZE SPONGE 4X4 12PLY STRL (GAUZE/BANDAGES/DRESSINGS) ×2 IMPLANT
GAUZE XEROFORM 1X8 LF (GAUZE/BANDAGES/DRESSINGS) ×2 IMPLANT
GLOVE BIO SURGEON STRL SZ7.5 (GLOVE) ×2 IMPLANT
GLOVE BIOGEL PI IND STRL 8 (GLOVE) IMPLANT
GLOVE BIOGEL PI IND STRL 8.5 (GLOVE) ×1 IMPLANT
GLOVE BIOGEL PI INDICATOR 8 (GLOVE) ×1
GLOVE BIOGEL PI INDICATOR 8.5 (GLOVE) ×1
GLOVE SURG ORTHO 8.0 STRL STRW (GLOVE) IMPLANT
GLOVE SURG SYN 8.0 (GLOVE) ×2 IMPLANT
GLOVE SURG SYN 8.0 PF PI (GLOVE) IMPLANT
GOWN STRL REIN XL XLG (GOWN DISPOSABLE) ×1 IMPLANT
GOWN STRL REUS W/ TWL LRG LVL3 (GOWN DISPOSABLE) ×1 IMPLANT
GOWN STRL REUS W/TWL LRG LVL3 (GOWN DISPOSABLE) ×1
GOWN STRL REUS W/TWL XL LVL3 (GOWN DISPOSABLE) ×2 IMPLANT
IV SET EXTENSION GRAVITY 40 LF (IV SETS) ×2 IMPLANT
MANIFOLD NEPTUNE II (INSTRUMENTS) IMPLANT
NDL EPIDURAL TUOHY 20GX3.5 (NEEDLE) IMPLANT
NDL SAFETY ECLIPSE 18X1.5 (NEEDLE) ×1 IMPLANT
NDL SPNL 18GX3.5 QUINCKE PK (NEEDLE) IMPLANT
NEEDLE HYPO 18GX1.5 SHARP (NEEDLE) ×1
NEEDLE HYPO 22GX1.5 SAFETY (NEEDLE) ×2 IMPLANT
NEEDLE SPNL 18GX3.5 QUINCKE PK (NEEDLE) IMPLANT
NEEDLE TUOHY 20GX3.5 (NEEDLE) IMPLANT
PACK BASIN DAY SURGERY FS (CUSTOM PROCEDURE TRAY) ×2 IMPLANT
PAD CAST 3X4 CTTN HI CHSV (CAST SUPPLIES) ×1 IMPLANT
PADDING CAST ABS 3INX4YD NS (CAST SUPPLIES) ×1
PADDING CAST ABS 4INX4YD NS (CAST SUPPLIES) ×1
PADDING CAST ABS COTTON 3X4 (CAST SUPPLIES) ×1 IMPLANT
PADDING CAST ABS COTTON 4X4 ST (CAST SUPPLIES) ×1 IMPLANT
PADDING CAST COTTON 3X4 STRL (CAST SUPPLIES) ×1
PROBE BIPOLAR ARTHRO 85MM 30D (MISCELLANEOUS) ×1 IMPLANT
ROUTER HOODED VORTEX 2.9MM (BLADE) IMPLANT
SET SM JOINT TUBING/CANN (CANNULA) IMPLANT
SHEET MEDIUM DRAPE 40X70 STRL (DRAPES) ×1 IMPLANT
SLEEVE SCD COMPRESS KNEE MED (MISCELLANEOUS) ×2 IMPLANT
SLING ARM FOAM STRAP SML (SOFTGOODS) ×1 IMPLANT
SPLINT PLASTER CAST XFAST 3X15 (CAST SUPPLIES) ×30 IMPLANT
SPLINT PLASTER XTRA FASTSET 3X (CAST SUPPLIES) ×30
STOCKINETTE 4X48 STRL (DRAPES) ×2 IMPLANT
STRIP CLOSURE SKIN 1/2X4 (GAUZE/BANDAGES/DRESSINGS) IMPLANT
STRIP CLOSURE SKIN 1/4X4 (GAUZE/BANDAGES/DRESSINGS) IMPLANT
SUCTION FRAZIER HANDLE 10FR (MISCELLANEOUS)
SUCTION TUBE FRAZIER 10FR DISP (MISCELLANEOUS) IMPLANT
SUT ETHILON 4 0 PS 2 18 (SUTURE) IMPLANT
SUT ETHILON 5 0 P 3 18 (SUTURE)
SUT MERSILENE 4 0 P 3 (SUTURE) IMPLANT
SUT NYLON ETHILON 5-0 P-3 1X18 (SUTURE) IMPLANT
SUT PDS AB 2-0 CT2 27 (SUTURE) IMPLANT
SUT STEEL 4 0 (SUTURE) IMPLANT
SUT VIC AB 2-0 PS2 27 (SUTURE) IMPLANT
SUT VICRYL 4-0 PS2 18IN ABS (SUTURE) IMPLANT
SYR BULB 3OZ (MISCELLANEOUS) ×2 IMPLANT
SYR CONTROL 10ML LL (SYRINGE) ×2 IMPLANT
TUBE CONNECTING 20X1/4 (TUBING) ×1 IMPLANT
TUBING ARTHRO INFLOW-ONLY STRL (TUBING) IMPLANT
UNDERPAD 30X30 (UNDERPADS AND DIAPERS) ×2 IMPLANT
WATER STERILE IRR 1000ML POUR (IV SOLUTION) ×2 IMPLANT

## 2018-02-27 NOTE — Anesthesia Procedure Notes (Signed)
Anesthesia Regional Block: Supraclavicular block   Pre-Anesthetic Checklist: ,, timeout performed, Correct Patient, Correct Site, Correct Laterality, Correct Procedure, Correct Position, site marked, Risks and benefits discussed,  Surgical consent,  Pre-op evaluation,  At surgeon's request and post-op pain management  Laterality: Left  Prep: Maximum Sterile Barrier Precautions used, chloraprep       Needles:  Injection technique: Single-shot  Needle Type: Echogenic Stimulator Needle     Needle Length: 9cm  Needle Gauge: 22     Additional Needles:   Procedures:,,,, ultrasound used (permanent image in chart),,,,  Narrative:  Start time: 02/27/2018 8:10 AM End time: 02/27/2018 8:20 AM Injection made incrementally with aspirations every 5 mL.  Performed by: Personally  Anesthesiologist: Elmer Picker, MD  Additional Notes: Monitors applied. No increased pain on injection. No increased resistance to injection. Injection made in 5cc increments. Good needle visualization. Patient tolerated procedure well.

## 2018-02-27 NOTE — Transfer of Care (Signed)
Immediate Anesthesia Transfer of Care Note  [name]  Procedure(s) Performed: WRIST ARTHROSCOPY WITH DEBRIDEMENT (Left Wrist)  Patient Location: PACU  Anesthesia Type:General and Regional  Level of Consciousness: awake and sedated  Airway & Oxygen Therapy: Patient Spontanous Breathing and Patient connected to face mask oxygen  Post-op Assessment: Report given to RN and Post -op Vital signs reviewed and stable  Post vital signs: Reviewed and stable  Last Vitals:  Vitals Value Taken Time  BP 113/69 02/27/2018  9:50 AM  Temp    Pulse 63 02/27/2018  9:51 AM  Resp 12 02/27/2018  9:51 AM  SpO2 100 % 02/27/2018  9:51 AM  Vitals shown include unvalidated device data.  Last Pain:  Vitals:   02/27/18 0741  TempSrc: Oral  PainSc: 2       Patients Stated Pain Goal: 2 (02/27/18 0741)  Complications: No apparent anesthesia complications

## 2018-02-27 NOTE — Discharge Instructions (Addendum)

## 2018-02-27 NOTE — H&P (Signed)
Hannah Klein is an 43 y.o. female.   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  Has tried splinting and injections without lasting relief.  She wishes to have left wrist arthroscopy with possible TFCC repair as necessary.  Allergies:  Allergies  Allergen Reactions  . Advair Diskus [Fluticasone-Salmeterol] Other (See Comments)    "dries my throat out really bad'  . Lipitor [Atorvastatin Calcium] Other (See Comments)    Elevations in blood pressure?    Past Medical History:  Diagnosis Date  . Arthritis   . Asthma   . Environmental allergies   . Generalized anxiety disorder   . GERD (gastroesophageal reflux disease)   . Hx of colonic polyps   . Hypertension   . Migraines   . MRSA (methicillin resistant Staphylococcus aureus)    Lt upper arm  approx 8 years ago    Past Surgical History:  Procedure Laterality Date  . CHOLECYSTECTOMY    . DILATION AND CURETTAGE OF UTERUS    . NECK SURGERY     Fusion to C6 and C7    Family History: History reviewed. No pertinent family history.  Social History:   reports that she quit smoking about 11 years ago. She has never used smokeless tobacco. She reports that she does not drink alcohol or use drugs.  Medications: Medications Prior to Admission  Medication Sig Dispense Refill  . beclomethasone (QVAR REDIHALER) 80 MCG/ACT inhaler Inhale 1 puff into the lungs 2 (two) times daily. 1 Inhaler 5  . Cholecalciferol (VITAMIN D3 PO) Take 1,000 Units by mouth.    . docusate sodium (COLACE) 100 MG capsule Take 100 mg by mouth daily.    Marland Kitchen esomeprazole (NEXIUM) 40 MG capsule TAKE 1 CAPSULE BY MOUTH TWICE A DAY 60 capsule 2  . hydrochlorothiazide (HYDRODIURIL) 12.5 MG tablet Take 1 tablet (12.5 mg total) by mouth daily. For blood pressure. 90 tablet 0  . hydrOXYzine (VISTARIL) 50 MG capsule TAKE 1 CAPSULE (50 MG TOTAL) BY MOUTH 3 (THREE) TIMES DAILY AS NEEDED. 90 capsule 2  . levocetirizine  (XYZAL) 5 MG tablet Take 1 tablet by mouth once to twice daily as needed for allergies. 180 tablet 0  . meloxicam (MOBIC) 7.5 MG tablet Take 7.5 mg by mouth daily.    . Misc Natural Products (TUMERSAID PO) Take 500 mg by mouth daily.    . Multiple Vitamin (MULTIVITAMIN) capsule Take 1 capsule by mouth daily.    . VENTOLIN HFA 108 (90 Base) MCG/ACT inhaler Inhale 1-2 puffs into the lungs every 4 (four) hours as needed. 18 g 1  . vitamin B-12 (CYANOCOBALAMIN) 1000 MCG tablet Take 1,000 mcg by mouth daily.    . vitamin C (ASCORBIC ACID) 500 MG tablet Take 500 mg by mouth daily.    . vitamin E 100 UNIT capsule Take 400 Units by mouth daily.      No results found for this or any previous visit (from the past 48 hour(s)).  No results found.   A comprehensive review of systems was negative.  Blood pressure 127/69, pulse 65, temperature 98 F (36.7 C), temperature source Oral, resp. rate 16, height 5\' 7"  (1.702 m), weight 97.8 kg, last menstrual period 02/07/2018, SpO2 100 %.  General appearance: alert, cooperative and appears stated age Head: Normocephalic, without obvious abnormality, atraumatic Neck: supple, symmetrical, trachea midline Cardio: regular rate and rhythm Resp: clear to auscultation bilaterally Extremities: Intact sensation and capillary refill all digits.  +  epl/fpl/io.  No wounds.  Pulses: 2+ and symmetric Skin: Skin color, texture, turgor normal. 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  Non operative and operative treatment options have been discussed with the patient and patient wishes to proceed with operative treatment. Risks, benefits, and alternatives of surgery have been discussed and the patient agrees with the plan of care.   Betha Loa 02/27/2018, 8:29 AM

## 2018-02-27 NOTE — Anesthesia Procedure Notes (Signed)
Procedure Name: LMA Insertion Performed by: Angalena Cousineau W, CRNA Pre-anesthesia Checklist: Patient identified, Emergency Drugs available, Suction available and Patient being monitored Patient Re-evaluated:Patient Re-evaluated prior to induction Oxygen Delivery Method: Circle system utilized Preoxygenation: Pre-oxygenation with 100% oxygen Induction Type: IV induction Ventilation: Mask ventilation without difficulty LMA: LMA inserted LMA Size: 4.0 Number of attempts: 1 Placement Confirmation: positive ETCO2 Tube secured with: Tape Dental Injury: Teeth and Oropharynx as per pre-operative assessment        

## 2018-02-27 NOTE — Progress Notes (Signed)
Assisted D. Woodrum with left, ultrasound guided, supraclavicular block. Side rails up, monitors on throughout procedure. See vital signs in flow sheet. Tolerated Procedure well. 

## 2018-02-27 NOTE — Op Note (Signed)
I assisted Surgeon(s) and Role:    * Betha Loa, MD - Primary    Cindee Salt, MD - Assisting on the Procedure(s): WRIST ARTHROSCOPY WITH DEBRIDEMENT on 02/27/2018.  I provided assistance on this case as follows: setup, stabilization, arthroscopy debridement closure and application of the dressing and splint  Electronically signed by: Cindee Salt, MD Date: 02/27/2018 Time: 9:50 AM

## 2018-02-27 NOTE — Anesthesia Postprocedure Evaluation (Signed)
Anesthesia Post Note  [name]  Procedure(s) Performed: WRIST ARTHROSCOPY WITH DEBRIDEMENT (Left Wrist)     Patient location during evaluation: PACU Anesthesia Type: Regional and General Level of consciousness: awake and alert Pain management: pain level controlled Vital Signs Assessment: post-procedure vital signs reviewed and stable Respiratory status: spontaneous breathing, nonlabored ventilation, respiratory function stable and patient connected to nasal cannula oxygen Cardiovascular status: blood pressure returned to baseline and stable Postop Assessment: no apparent nausea or vomiting Anesthetic complications: no    Last Vitals:  Vitals:   02/27/18 1010 02/27/18 1032  BP:  125/60  Pulse: 65 66  Resp: 20   Temp:  36.9 C  SpO2: 97% 98%    Last Pain:  Vitals:   02/27/18 1032  TempSrc: Oral  PainSc: 0-No pain                 Cornelious Bartolucci L Jaydynn Wolford

## 2018-02-27 NOTE — Anesthesia Preprocedure Evaluation (Addendum)
Anesthesia Evaluation  Patient identified by MRN, date of birth, ID band Patient awake    Reviewed: Allergy & Precautions, NPO status , Patient's Chart, lab work & pertinent test results  Airway Mallampati: III  TM Distance: >3 FB Neck ROM: Full  Mouth opening: Limited Mouth Opening  Dental no notable dental hx. (+) Teeth Intact, Dental Advisory Given   Pulmonary asthma , former smoker,    Pulmonary exam normal breath sounds clear to auscultation       Cardiovascular hypertension, negative cardio ROS Normal cardiovascular exam Rhythm:Regular Rate:Normal     Neuro/Psych  Headaches, PSYCHIATRIC DISORDERS Anxiety    GI/Hepatic Neg liver ROS, GERD  Controlled and Medicated,  Endo/Other  negative endocrine ROS  Renal/GU negative Renal ROS  negative genitourinary   Musculoskeletal  (+) Arthritis ,   Abdominal   Peds  Hematology negative hematology ROS (+)   Anesthesia Other Findings Left wrist arthroscopy  Reproductive/Obstetrics                            Anesthesia Physical Anesthesia Plan  ASA: II  Anesthesia Plan: General and Regional   Post-op Pain Management:  Regional for Post-op pain   Induction: Intravenous  PONV Risk Score and Plan: 3 and Midazolam, Dexamethasone and Ondansetron  Airway Management Planned: LMA  Additional Equipment:   Intra-op Plan:   Post-operative Plan: Extubation in OR  Informed Consent: I have reviewed the patients History and Physical, chart, labs and discussed the procedure including the risks, benefits and alternatives for the proposed anesthesia with the patient or authorized representative who has indicated his/her understanding and acceptance.   Dental advisory given  Plan Discussed with: CRNA  Anesthesia Plan Comments:         Anesthesia Quick Evaluation

## 2018-02-27 NOTE — Op Note (Addendum)
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   ASSISTANT: Cindee Salt, MD   ANESTHESIA:  General with regional   INTRAVENOUS FLUIDS:  Per anesthesia flow sheet.   ESTIMATED BLOOD LOSS:  Minimal.   COMPLICATIONS:  None.   SPECIMENS:  none   TOURNIQUET TIME:   None   DISPOSITION:  Stable to PACU.   INDICATIONS: 43 year old female with continued left wrist pain.  She is tried splinting and injection without lasting relief.  She wishes to have left wrist arthroscopy with debridement and possible repair. Risks, benefits and alternatives of surgery were discussed including the risks of blood loss, infection, damage to nerves, vessels, tendons, ligaments, bone for surgery, need for additional surgery, complications with wound healing, continued pain, nonunion, malunion, stiffness.  She voiced understanding of these risks and elected to proceed.  OPERATIVE COURSE:  After being identified preoperatively by myself,  the patient and I agreed on the procedure and site of the procedure.  The surgical site was marked.  Surgical consent had been signed. She was given IV Ancef as preoperative antibiotic prophylaxis. She was transferred to the operating room and placed on the operating table in supine position with the Left upper extremity on an arm board.  General anesthesia was induced by the anesthesiologist. A regional block had been performed by anesthesia in preoperative holding.   Left upper extremity was prepped and draped in normal sterile orthopedic fashion.  A surgical pause was performed  between the surgeons, anesthesia, and operating room staff and all were in agreement as to the patient, procedure, and site of procedure.  Tourniquet was not inflated.    The wrist and hand were secured in the arthroscopy tower.  The 3-4 portal was made after insufflation of the joint with sterile saline and incising through the skin only.  The subcutaneous tissues were spread with a hemostat.  The camera was introduced.  The joint was inspected.  There was some fraying of the SL ligament without tear.  There was patulous dorsal capsule.  TFCC.  Intact.  Prestyloid recess was identified.  There was fraying of the LT ligament.  Collene Mares was introduced through the 4-5 portal using the same technique.  This was used to debride the frayed tissues.  The soft tissue ablation wand was introduced.  This was used to treat the synovitis at the ulnar side of the wrist.  This provided some shrinkage of the capsular attachment of the TFCC.  He can was then placed to the 4-5 portal.  There was noted to be a chondral flap on the ulnar side of the lunate.  This was debrided with the shaver.  The camera was placed back into the 3-4 portal and the biters used to debride the chondral flap more which was then taken to a stable edge with the shavers.  The camera was placed into the midcarpal joint.  There was instability noted at the LT interval.  The SL interval was tight.  There was no degeneration of the proximal pole of the capitate.  The arthroscopy equipment was removed.  The  portals were closed with 4-0 nylon in a horizontal mattress fashion.  There were then dressed with sterile Xeroform 4 x 4's and wrapped with a Kerlix bandage.  A volar splint was placed and wrapped with Kerlix and Ace bandage.  The tourniquet had never been inflated.  Fingertips were pink with brisk capillary refill after deflation of tourniquet.  The operative  drapes were broken down.  The patient was awoken from anesthesia safely.  She was transferred back  to the stretcher and taken to PACU in stable condition.  I will see her back in the office in 1 week for postoperative followup.  I will give her a prescription for Norco 5/325 1-2 tabs PO q6 hours prn pain, dispense # 20.   Betha LoaKevin Rafferty Postlewait, MD Electronically signed, 02/27/18  Addendum (03/07/18): clarify post op diagnosis.

## 2018-02-28 ENCOUNTER — Encounter (HOSPITAL_BASED_OUTPATIENT_CLINIC_OR_DEPARTMENT_OTHER): Payer: Self-pay | Admitting: Orthopedic Surgery

## 2018-03-09 ENCOUNTER — Other Ambulatory Visit: Payer: Self-pay | Admitting: Primary Care

## 2018-03-09 DIAGNOSIS — J4540 Moderate persistent asthma, uncomplicated: Secondary | ICD-10-CM

## 2018-03-11 DIAGNOSIS — J4520 Mild intermittent asthma, uncomplicated: Secondary | ICD-10-CM

## 2018-03-11 MED ORDER — VENTOLIN HFA 108 (90 BASE) MCG/ACT IN AERS: 1.0000 | INHALATION_SPRAY | RESPIRATORY_TRACT | 0 refills | Status: DC | PRN

## 2018-04-04 ENCOUNTER — Other Ambulatory Visit: Payer: Self-pay | Admitting: Primary Care

## 2018-04-04 DIAGNOSIS — I10 Essential (primary) hypertension: Secondary | ICD-10-CM

## 2018-04-06 ENCOUNTER — Other Ambulatory Visit: Payer: Self-pay | Admitting: Primary Care

## 2018-04-06 DIAGNOSIS — K219 Gastro-esophageal reflux disease without esophagitis: Secondary | ICD-10-CM

## 2018-05-01 ENCOUNTER — Other Ambulatory Visit: Payer: Self-pay | Admitting: Primary Care

## 2018-05-01 DIAGNOSIS — J4540 Moderate persistent asthma, uncomplicated: Secondary | ICD-10-CM

## 2018-05-01 DIAGNOSIS — K219 Gastro-esophageal reflux disease without esophagitis: Secondary | ICD-10-CM

## 2018-05-13 ENCOUNTER — Other Ambulatory Visit: Payer: Self-pay | Admitting: Primary Care

## 2018-05-13 DIAGNOSIS — J4520 Mild intermittent asthma, uncomplicated: Secondary | ICD-10-CM

## 2018-05-15 ENCOUNTER — Other Ambulatory Visit: Payer: Self-pay | Admitting: Primary Care

## 2018-05-15 DIAGNOSIS — F411 Generalized anxiety disorder: Secondary | ICD-10-CM

## 2018-05-27 ENCOUNTER — Other Ambulatory Visit: Payer: Self-pay | Admitting: Primary Care

## 2018-05-27 DIAGNOSIS — J4520 Mild intermittent asthma, uncomplicated: Secondary | ICD-10-CM

## 2018-05-27 DIAGNOSIS — F411 Generalized anxiety disorder: Secondary | ICD-10-CM

## 2018-06-05 ENCOUNTER — Other Ambulatory Visit: Payer: Self-pay | Admitting: Primary Care

## 2018-06-05 DIAGNOSIS — F411 Generalized anxiety disorder: Secondary | ICD-10-CM

## 2018-06-11 ENCOUNTER — Other Ambulatory Visit: Payer: Self-pay | Admitting: Primary Care

## 2018-06-11 DIAGNOSIS — J4520 Mild intermittent asthma, uncomplicated: Secondary | ICD-10-CM

## 2018-07-03 ENCOUNTER — Other Ambulatory Visit: Payer: Self-pay | Admitting: Primary Care

## 2018-07-03 DIAGNOSIS — F411 Generalized anxiety disorder: Secondary | ICD-10-CM

## 2018-07-07 ENCOUNTER — Other Ambulatory Visit: Payer: Self-pay | Admitting: Primary Care

## 2018-07-07 DIAGNOSIS — E785 Hyperlipidemia, unspecified: Secondary | ICD-10-CM

## 2018-07-07 DIAGNOSIS — I10 Essential (primary) hypertension: Secondary | ICD-10-CM

## 2018-07-10 ENCOUNTER — Other Ambulatory Visit (INDEPENDENT_AMBULATORY_CARE_PROVIDER_SITE_OTHER): Payer: BLUE CROSS/BLUE SHIELD

## 2018-07-10 DIAGNOSIS — I10 Essential (primary) hypertension: Secondary | ICD-10-CM

## 2018-07-10 DIAGNOSIS — E785 Hyperlipidemia, unspecified: Secondary | ICD-10-CM | POA: Diagnosis not present

## 2018-07-10 DIAGNOSIS — E559 Vitamin D deficiency, unspecified: Secondary | ICD-10-CM

## 2018-07-10 LAB — COMPREHENSIVE METABOLIC PANEL
ALT: 16 U/L (ref 0–35)
AST: 13 U/L (ref 0–37)
Albumin: 4.2 g/dL (ref 3.5–5.2)
Alkaline Phosphatase: 44 U/L (ref 39–117)
BUN: 14 mg/dL (ref 6–23)
CO2: 26 mEq/L (ref 19–32)
Calcium: 9 mg/dL (ref 8.4–10.5)
Chloride: 104 mEq/L (ref 96–112)
Creatinine, Ser: 0.79 mg/dL (ref 0.40–1.20)
GFR: 79.06 mL/min (ref >60.00)
Glucose, Bld: 92 mg/dL (ref 70–99)
Potassium: 4.2 mEq/L (ref 3.5–5.1)
Sodium: 137 mEq/L (ref 135–145)
Total Bilirubin: 0.8 mg/dL (ref 0.2–1.2)
Total Protein: 6.6 g/dL (ref 6.0–8.3)

## 2018-07-10 LAB — LIPID PANEL
Cholesterol: 256 mg/dL — ABNORMAL HIGH (ref 0–200)
HDL: 44.3 mg/dL (ref >39.00)
LDL Cholesterol: 179 mg/dL — ABNORMAL HIGH (ref 0–99)
NonHDL: 211.72
Total CHOL/HDL Ratio: 6
Triglycerides: 165 mg/dL — ABNORMAL HIGH (ref 0.0–149.0)
VLDL: 33 mg/dL (ref 0.0–40.0)

## 2018-07-10 LAB — VITAMIN D 25 HYDROXY (VIT D DEFICIENCY, FRACTURES): VITD: 24.51 ng/mL — ABNORMAL LOW (ref 30.00–100.00)

## 2018-07-10 LAB — HEMOGLOBIN A1C: Hgb A1c MFr Bld: 5.4 % (ref 4.6–6.5)

## 2018-07-13 DIAGNOSIS — E559 Vitamin D deficiency, unspecified: Secondary | ICD-10-CM

## 2018-07-13 DIAGNOSIS — E785 Hyperlipidemia, unspecified: Secondary | ICD-10-CM

## 2018-07-30 ENCOUNTER — Other Ambulatory Visit: Payer: Self-pay | Admitting: Primary Care

## 2018-07-30 DIAGNOSIS — J4520 Mild intermittent asthma, uncomplicated: Secondary | ICD-10-CM

## 2018-07-30 DIAGNOSIS — F411 Generalized anxiety disorder: Secondary | ICD-10-CM

## 2018-08-04 ENCOUNTER — Other Ambulatory Visit: Payer: Self-pay | Admitting: Primary Care

## 2018-08-04 DIAGNOSIS — K219 Gastro-esophageal reflux disease without esophagitis: Secondary | ICD-10-CM

## 2018-08-04 DIAGNOSIS — J4540 Moderate persistent asthma, uncomplicated: Secondary | ICD-10-CM

## 2018-08-06 ENCOUNTER — Other Ambulatory Visit: Payer: Self-pay | Admitting: Primary Care

## 2018-08-06 DIAGNOSIS — F411 Generalized anxiety disorder: Secondary | ICD-10-CM

## 2018-08-06 DIAGNOSIS — J4520 Mild intermittent asthma, uncomplicated: Secondary | ICD-10-CM

## 2018-08-11 ENCOUNTER — Other Ambulatory Visit: Payer: Self-pay | Admitting: Primary Care

## 2018-08-11 DIAGNOSIS — J4520 Mild intermittent asthma, uncomplicated: Secondary | ICD-10-CM

## 2018-08-11 DIAGNOSIS — F411 Generalized anxiety disorder: Secondary | ICD-10-CM

## 2018-10-10 ENCOUNTER — Other Ambulatory Visit: Payer: Self-pay | Admitting: Primary Care

## 2018-10-10 DIAGNOSIS — F411 Generalized anxiety disorder: Secondary | ICD-10-CM

## 2018-10-24 ENCOUNTER — Other Ambulatory Visit: Payer: Self-pay | Admitting: Primary Care

## 2018-10-24 DIAGNOSIS — K219 Gastro-esophageal reflux disease without esophagitis: Secondary | ICD-10-CM

## 2018-10-24 DIAGNOSIS — J4540 Moderate persistent asthma, uncomplicated: Secondary | ICD-10-CM

## 2018-11-21 ENCOUNTER — Other Ambulatory Visit: Payer: Self-pay | Admitting: Primary Care

## 2018-11-21 DIAGNOSIS — F411 Generalized anxiety disorder: Secondary | ICD-10-CM

## 2018-12-25 ENCOUNTER — Ambulatory Visit: Payer: BLUE CROSS/BLUE SHIELD | Admitting: Family Medicine

## 2019-01-18 ENCOUNTER — Other Ambulatory Visit: Payer: Self-pay | Admitting: Primary Care

## 2019-01-18 DIAGNOSIS — E785 Hyperlipidemia, unspecified: Secondary | ICD-10-CM

## 2019-01-18 DIAGNOSIS — I10 Essential (primary) hypertension: Secondary | ICD-10-CM

## 2019-01-18 DIAGNOSIS — E559 Vitamin D deficiency, unspecified: Secondary | ICD-10-CM

## 2019-01-20 ENCOUNTER — Other Ambulatory Visit: Payer: Self-pay | Admitting: Primary Care

## 2019-01-20 DIAGNOSIS — F411 Generalized anxiety disorder: Secondary | ICD-10-CM

## 2019-01-20 DIAGNOSIS — J4520 Mild intermittent asthma, uncomplicated: Secondary | ICD-10-CM

## 2019-01-29 ENCOUNTER — Other Ambulatory Visit (INDEPENDENT_AMBULATORY_CARE_PROVIDER_SITE_OTHER): Payer: BC Managed Care – PPO

## 2019-01-29 ENCOUNTER — Other Ambulatory Visit: Payer: Self-pay

## 2019-01-29 DIAGNOSIS — E785 Hyperlipidemia, unspecified: Secondary | ICD-10-CM | POA: Diagnosis not present

## 2019-01-29 DIAGNOSIS — I10 Essential (primary) hypertension: Secondary | ICD-10-CM

## 2019-01-29 DIAGNOSIS — E559 Vitamin D deficiency, unspecified: Secondary | ICD-10-CM

## 2019-01-29 LAB — VITAMIN D 25 HYDROXY (VIT D DEFICIENCY, FRACTURES): VITD: 31.44 ng/mL (ref 30.00–100.00)

## 2019-01-29 LAB — LIPID PANEL
Cholesterol: 271 mg/dL — ABNORMAL HIGH (ref 0–200)
HDL: 46.7 mg/dL (ref >39.00)
NonHDL: 224.57
Total CHOL/HDL Ratio: 6
Triglycerides: 283 mg/dL — ABNORMAL HIGH (ref 0.0–149.0)
VLDL: 56.6 mg/dL — ABNORMAL HIGH (ref 0.0–40.0)

## 2019-01-29 LAB — CBC
HCT: 38.9 % (ref 36.0–46.0)
Hemoglobin: 13.1 g/dL (ref 12.0–15.0)
MCHC: 33.6 g/dL (ref 30.0–36.0)
MCV: 87.2 fl (ref 78.0–100.0)
Platelets: 212 10*3/uL (ref 150.0–400.0)
RBC: 4.46 Mil/uL (ref 3.87–5.11)
RDW: 12.7 % (ref 11.5–15.5)
WBC: 7.1 10*3/uL (ref 4.0–10.5)

## 2019-01-29 LAB — LDL CHOLESTEROL, DIRECT: Direct LDL: 170 mg/dL

## 2019-01-29 LAB — COMPREHENSIVE METABOLIC PANEL
ALT: 24 U/L (ref 0–35)
AST: 19 U/L (ref 0–37)
Albumin: 4.3 g/dL (ref 3.5–5.2)
Alkaline Phosphatase: 45 U/L (ref 39–117)
BUN: 16 mg/dL (ref 6–23)
CO2: 28 mEq/L (ref 19–32)
Calcium: 9.4 mg/dL (ref 8.4–10.5)
Chloride: 103 mEq/L (ref 96–112)
Creatinine, Ser: 0.85 mg/dL (ref 0.40–1.20)
GFR: 72.47 mL/min (ref >60.00)
Glucose, Bld: 99 mg/dL (ref 70–99)
Potassium: 3.9 mEq/L (ref 3.5–5.1)
Sodium: 138 mEq/L (ref 135–145)
Total Bilirubin: 0.8 mg/dL (ref 0.2–1.2)
Total Protein: 6.6 g/dL (ref 6.0–8.3)

## 2019-02-01 ENCOUNTER — Other Ambulatory Visit: Payer: Self-pay | Admitting: Primary Care

## 2019-02-01 DIAGNOSIS — J4540 Moderate persistent asthma, uncomplicated: Secondary | ICD-10-CM

## 2019-02-01 DIAGNOSIS — K219 Gastro-esophageal reflux disease without esophagitis: Secondary | ICD-10-CM

## 2019-02-02 ENCOUNTER — Ambulatory Visit (INDEPENDENT_AMBULATORY_CARE_PROVIDER_SITE_OTHER): Payer: BC Managed Care – PPO | Admitting: Primary Care

## 2019-02-02 ENCOUNTER — Other Ambulatory Visit: Payer: Self-pay

## 2019-02-02 VITALS — BP 124/76 | HR 82 | Temp 97.9°F | Ht 67.0 in | Wt 223.5 lb

## 2019-02-02 DIAGNOSIS — J4520 Mild intermittent asthma, uncomplicated: Secondary | ICD-10-CM

## 2019-02-02 DIAGNOSIS — Z1231 Encounter for screening mammogram for malignant neoplasm of breast: Secondary | ICD-10-CM

## 2019-02-02 DIAGNOSIS — G8929 Other chronic pain: Secondary | ICD-10-CM

## 2019-02-02 DIAGNOSIS — Z0000 Encounter for general adult medical examination without abnormal findings: Secondary | ICD-10-CM | POA: Diagnosis not present

## 2019-02-02 DIAGNOSIS — R42 Dizziness and giddiness: Secondary | ICD-10-CM

## 2019-02-02 DIAGNOSIS — F411 Generalized anxiety disorder: Secondary | ICD-10-CM | POA: Diagnosis not present

## 2019-02-02 DIAGNOSIS — J4540 Moderate persistent asthma, uncomplicated: Secondary | ICD-10-CM | POA: Diagnosis not present

## 2019-02-02 DIAGNOSIS — M5441 Lumbago with sciatica, right side: Secondary | ICD-10-CM

## 2019-02-02 DIAGNOSIS — E785 Hyperlipidemia, unspecified: Secondary | ICD-10-CM

## 2019-02-02 DIAGNOSIS — I10 Essential (primary) hypertension: Secondary | ICD-10-CM

## 2019-02-02 DIAGNOSIS — K219 Gastro-esophageal reflux disease without esophagitis: Secondary | ICD-10-CM

## 2019-02-02 MED ORDER — ROSUVASTATIN CALCIUM 5 MG PO TABS: 5.0000 mg | ORAL_TABLET | Freq: Every evening | ORAL | 3 refills | Status: DC

## 2019-02-02 MED ORDER — BUDESONIDE-FORMOTEROL FUMARATE 160-4.5 MCG/ACT IN AERO: 2.0000 | INHALATION_SPRAY | Freq: Two times a day (BID) | RESPIRATORY_TRACT | 0 refills | Status: DC

## 2019-02-02 MED ORDER — VENTOLIN HFA 108 (90 BASE) MCG/ACT IN AERS: 1.0000 | INHALATION_SPRAY | RESPIRATORY_TRACT | 0 refills | Status: DC | PRN

## 2019-02-02 MED ORDER — HYDROXYZINE PAMOATE 50 MG PO CAPS: ORAL_CAPSULE | ORAL | 1 refills | Status: DC

## 2019-02-02 MED ORDER — LEVOCETIRIZINE DIHYDROCHLORIDE 5 MG PO TABS: 5.0000 mg | ORAL_TABLET | Freq: Two times a day (BID) | ORAL | 3 refills | Status: DC | PRN

## 2019-02-02 NOTE — Assessment & Plan Note (Signed)
Continued and improved with PT. Encouraged weight loss, stretching, exercise.

## 2019-02-02 NOTE — Assessment & Plan Note (Signed)
Continued without change from one year ago. Even with weight loss LDL was above goal so there is an a hereditary component here.   Discussed long term effects of uncontrolled hyperlipidemia. At this point she doesn't feel as though she can work on exercise and a healthy diet and would like to try treatment.  Rx for Crestor 5 mg sent to pharmacy. Repeat labs in 2 months.

## 2019-02-02 NOTE — Assessment & Plan Note (Signed)
Stable in the office today, continue to monitor.  

## 2019-02-02 NOTE — Assessment & Plan Note (Signed)
Chronic, using hydroxyzine once daily on average which is an improvement. Continue same.

## 2019-02-02 NOTE — Patient Instructions (Addendum)
Start rosuvastatin (Crestor) 5 mg tablets once nightly for cholesterol.  Start Symbicort inhaler for asthma, inhale 1 puff into the lungs twice daily, everyday.   Use the albuterol inhaler sparingly as needed for breakthrough shortness of breath.  Try meclizine tablets as needed for vertigo, this may cause drowsiness. Use the Epley's Maneuvers for vertigo.   Start exercising. You should be getting 150 minutes of moderate intensity exercise weekly.  It's important to improve your diet by reducing consumption of fast food, fried food, processed snack foods, sugary drinks. Increase consumption of fresh vegetables and fruits, whole grains, water.  Ensure you are drinking 64 ounces of water daily.  Schedule a lab only appointment for 2 months for cholesterol check.  Please update me in 2 weeks regarding the asthma.  It was a pleasure to see you today!   Preventive Care 75-34 Years Old, Female Preventive care refers to visits with your health care provider and lifestyle choices that can promote health and wellness. This includes:  A yearly physical exam. This may also be called an annual well check.  Regular dental visits and eye exams.  Immunizations.  Screening for certain conditions.  Healthy lifestyle choices, such as eating a healthy diet, getting regular exercise, not using drugs or products that contain nicotine and tobacco, and limiting alcohol use. What can I expect for my preventive care visit? Physical exam Your health care provider will check your:  Height and weight. This may be used to calculate body mass index (BMI), which tells if you are at a healthy weight.  Heart rate and blood pressure.  Skin for abnormal spots. Counseling Your health care provider may ask you questions about your:  Alcohol, tobacco, and drug use.  Emotional well-being.  Home and relationship well-being.  Sexual activity.  Eating habits.  Work and work Statistician.  Method of  birth control.  Menstrual cycle.  Pregnancy history. What immunizations do I need?  Influenza (flu) vaccine  This is recommended every year. Tetanus, diphtheria, and pertussis (Tdap) vaccine  You may need a Td booster every 10 years. Varicella (chickenpox) vaccine  You may need this if you have not been vaccinated. Zoster (shingles) vaccine  You may need this after age 31. Measles, mumps, and rubella (MMR) vaccine  You may need at least one dose of MMR if you were born in 1957 or later. You may also need a second dose. Pneumococcal conjugate (PCV13) vaccine  You may need this if you have certain conditions and were not previously vaccinated. Pneumococcal polysaccharide (PPSV23) vaccine  You may need one or two doses if you smoke cigarettes or if you have certain conditions. Meningococcal conjugate (MenACWY) vaccine  You may need this if you have certain conditions. Hepatitis A vaccine  You may need this if you have certain conditions or if you travel or work in places where you may be exposed to hepatitis A. Hepatitis B vaccine  You may need this if you have certain conditions or if you travel or work in places where you may be exposed to hepatitis B. Haemophilus influenzae type b (Hib) vaccine  You may need this if you have certain conditions. Human papillomavirus (HPV) vaccine  If recommended by your health care provider, you may need three doses over 6 months. You may receive vaccines as individual doses or as more than one vaccine together in one shot (combination vaccines). Talk with your health care provider about the risks and benefits of combination vaccines. What tests do I  need? Blood tests  Lipid and cholesterol levels. These may be checked every 5 years, or more frequently if you are over 61 years old.  Hepatitis C test.  Hepatitis B test. Screening  Lung cancer screening. You may have this screening every year starting at age 47 if you have a  30-pack-year history of smoking and currently smoke or have quit within the past 15 years.  Colorectal cancer screening. All adults should have this screening starting at age 76 and continuing until age 21. Your health care provider may recommend screening at age 73 if you are at increased risk. You will have tests every 1-10 years, depending on your results and the type of screening test.  Diabetes screening. This is done by checking your blood sugar (glucose) after you have not eaten for a while (fasting). You may have this done every 1-3 years.  Mammogram. This may be done every 1-2 years. Talk with your health care provider about when you should start having regular mammograms. This may depend on whether you have a family history of breast cancer.  BRCA-related cancer screening. This may be done if you have a family history of breast, ovarian, tubal, or peritoneal cancers.  Pelvic exam and Pap test. This may be done every 3 years starting at age 44. Starting at age 26, this may be done every 5 years if you have a Pap test in combination with an HPV test. Other tests  Sexually transmitted disease (STD) testing.  Bone density scan. This is done to screen for osteoporosis. You may have this scan if you are at high risk for osteoporosis. Follow these instructions at home: Eating and drinking  Eat a diet that includes fresh fruits and vegetables, whole grains, lean protein, and low-fat dairy.  Take vitamin and mineral supplements as recommended by your health care provider.  Do not drink alcohol if: ? Your health care provider tells you not to drink. ? You are pregnant, may be pregnant, or are planning to become pregnant.  If you drink alcohol: ? Limit how much you have to 0-1 drink a day. ? Be aware of how much alcohol is in your drink. In the [place] one drink equals one 12 oz bottle of beer (355 mL), one 5 oz glass of wine (148 mL), or one 1 oz glass of hard liquor (44 mL). Lifestyle   Take daily care of your teeth and gums.  Stay active. Exercise for at least 30 minutes on 5 or more days each week.  Do not use any products that contain nicotine or tobacco, such as cigarettes, e-cigarettes, and chewing tobacco. If you need help quitting, ask your health care provider.  If you are sexually active, practice safe sex. Use a condom or other form of birth control (contraception) in order to prevent pregnancy and STIs (sexually transmitted infections).  If told by your health care provider, take low-dose aspirin daily starting at age 16. What's next?  Visit your health care provider once a year for a well check visit.  Ask your health care provider how often you should have your eyes and teeth checked.  Stay up to date on all vaccines. This information is not intended to replace advice given to you by your health care provider. Make sure you discuss any questions you have with your health care provider. Document Released: 03/11/2015 Document Revised: 10/24/2017 Document Reviewed: 10/24/2017 Elsevier Patient Education  2020 [employer].   How to Perform the Printmaker The Epley  maneuver is an exercise that relieves symptoms of vertigo. Vertigo is the feeling that you or your surroundings are moving when they are not. When you feel vertigo, you may feel like the room is spinning and have trouble walking. Dizziness is a little different than vertigo. When you are dizzy, you may feel unsteady or light-headed. You can do this maneuver at home whenever you have symptoms of vertigo. You can do it up to 3 times a day until your symptoms go away. Even though the Epley maneuver may relieve your vertigo for a few weeks, it is possible that your symptoms will return. This maneuver relieves vertigo, but it does not relieve dizziness. What are the risks? If it is done correctly, the Epley maneuver is considered safe. Sometimes it can lead to dizziness or nausea that goes away after  a short time. If you develop other symptoms, such as changes in vision, weakness, or numbness, stop doing the maneuver and call your health care provider. How to perform the Epley maneuver 1. Sit on the edge of a bed or table with your back straight and your legs extended or hanging over the edge of the bed or table. 2. Turn your head halfway toward the affected ear or side. 3. Lie backward quickly with your head turned until you are lying flat on your back. You may want to position a pillow under your shoulders. 4. Hold this position for 30 seconds. You may experience an attack of vertigo. This is normal. 5. Turn your head to the opposite direction until your unaffected ear is facing the floor. 6. Hold this position for 30 seconds. You may experience an attack of vertigo. This is normal. Hold this position until the vertigo stops. 7. Turn your whole body to the same side as your head. Hold for another 30 seconds. 8. Sit back up. You can repeat this exercise up to 3 times a day. Follow these instructions at home:  After doing the Epley maneuver, you can return to your normal activities.  Ask your health care provider if there is anything you should do at home to prevent vertigo. He or she may recommend that you: ? Keep your head raised (elevated) with two or more pillows while you sleep. ? Do not sleep on the side of your affected ear. ? Get up slowly from bed. ? Avoid sudden movements during the day. ? Avoid extreme head movement, like looking up or bending over. Contact a health care provider if:  Your vertigo gets worse.  You have other symptoms, including: ? Nausea. ? Vomiting. ? Headache. Get help right away if:  You have vision changes.  You have a severe or worsening headache or neck pain.  You cannot stop vomiting.  You have new numbness or weakness in any part of your body. Summary  Vertigo is the feeling that you or your surroundings are moving when they are not.   The Epley maneuver is an exercise that relieves symptoms of vertigo.  If the Epley maneuver is done correctly, it is considered safe. You can do it up to 3 times a day. This information is not intended to replace advice given to you by your health care provider. Make sure you discuss any questions you have with your health care provider. Document Released: 02/17/2013 Document Revised: 01/25/2017 Document Reviewed: 01/03/2016 Elsevier Patient Education  2020 [employer].

## 2019-02-02 NOTE — Assessment & Plan Note (Signed)
Endorses intermittent symptoms. Discussed use of Meclzine and Epley's maneuvers.  She will update.

## 2019-02-02 NOTE — Assessment & Plan Note (Signed)
Uncontrolled with daily use of albuterol inhaler. Didn't notice improvement with Qvar, Flovent was too costly.  Rx for Symbicort sent to pharmacy.  Discussed to use albuterol only PRN.

## 2019-02-02 NOTE — Assessment & Plan Note (Signed)
Doing well on Nexium BID, continue same.

## 2019-02-02 NOTE — Assessment & Plan Note (Signed)
Tetanus UTD, declines influenza vaccination. Pap smear due, she declines today and defers until next year. Mammogram overdue, orders placed. Discussed the importance of a healthy diet and regular exercise in order for weight loss, and to reduce the risk of any potential medical problems. Exam today stable. Labs reviewed.

## 2019-02-02 NOTE — Progress Notes (Signed)
Subjective:    Patient ID: Hannah Klein, female    DOB: [birth year], 43 y.o.   MRN: [id number]  HPI  [name] is a 43 year old female who presents today for complete physical.  She's struggling with vertigo for the last three months, occurs several times daily with abrupt positional changes lasting a few seconds. She describes her dizziness as if the room is spinning when the symptoms occur. This feels like her prior vertigo that occurred in physical therapy, not as bad.   Immunizations: -Tetanus: Completed in 2019 -Influenza: Declines   Diet: She endorses a fair diet. Eating both home cooked meals and take out food. Drinking water only, occasional sweet tea. Exercise: She is active at work, no regular exercise.   Eye exam: Completed 4 years ago, due in January 2021 Dental exam: Completes regularly.   Pap Smear: Completed in 2017, declines today Mammogram: Completed in 2017  BP Readings from Last 3 Encounters:  02/02/19 124/76  02/27/18 125/60  01/09/18 140/78   Wt Readings from Last 3 Encounters:  02/02/19 223 lb 8 oz (101.4 kg)  02/27/18 215 lb 9.8 oz (97.8 kg)  01/09/18 213 lb (96.6 kg)   The 10-year ASCVD risk score Hannah Klein(Hannah DC Jr., et al., 2013) is: 1.6%   Values used to calculate the score:     Age: 43 years     Sex: Female     Is Non-Hispanic African American: No     Diabetic: No     Tobacco smoker: No     Systolic Blood Pressure: 124 mmHg     Is BP treated: No     HDL Cholesterol: 46.7 mg/dL     Total Cholesterol: 271 mg/dL   Review of Systems  Constitutional: Negative for unexpected weight change.  HENT: Negative for rhinorrhea.   Respiratory: Positive for shortness of breath. Negative for cough.   Cardiovascular: Negative for chest pain.  Gastrointestinal: Negative for constipation and diarrhea.  Genitourinary: Negative for difficulty urinating and menstrual problem.  Musculoskeletal: Negative for arthralgias and myalgias.  Skin: Negative for  rash.  Allergic/Immunologic: Positive for environmental allergies.  Neurological: Positive for dizziness. Negative for numbness and headaches.  Psychiatric/Behavioral: The patient is not nervous/anxious.        Past Medical History:  Diagnosis Date  . Arthritis   . Asthma   . Environmental allergies   . Generalized anxiety disorder   . GERD (gastroesophageal reflux disease)   . Hx of colonic polyps   . Hypertension   . Migraines   . MRSA (methicillin resistant Staphylococcus aureus)    Lt upper arm  approx 8 years ago     Social History   Socioeconomic History  . Marital status: Single    Spouse name: Not on file  . Number of children: Not on file  . Years of education: Not on file  . Highest education level: Not on file  Occupational History  . Not on file  Social Needs  . Financial resource strain: Not on file  . Food insecurity    Worry: Not on file    Inability: Not on file  . Transportation needs    Medical: Not on file    Non-medical: Not on file  Tobacco Use  . Smoking status: Former Smoker    Quit date: 2009    Years since quitting: 11.9  . Smokeless tobacco: Never Used  . Tobacco comment: quit smoking 2009  Substance and Sexual Activity  . Alcohol  use: No    Alcohol/week: 0.0 standard drinks  . Drug use: No  . Sexual activity: Not on file  Lifestyle  . Physical activity    Days per week: Not on file    Minutes per session: Not on file  . Stress: Not on file  Relationships  . Social Musician on phone: Not on file    Gets together: Not on file    Attends religious service: Not on file    Active member of club or organization: Not on file    Attends meetings of clubs or organizations: Not on file    Relationship status: Not on file  . Intimate partner violence    Fear of current or ex partner: Not on file    Emotionally abused: Not on file    Physically abused: Not on file    Forced sexual activity: Not on file  Other Topics  Concern  . Not on file  Social History Narrative   Single.   Moved from [place].   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    Past Surgical History:  Procedure Laterality Date  . CHOLECYSTECTOMY    . DILATION AND CURETTAGE OF UTERUS    . NECK SURGERY     Fusion to C6 and C7  . WRIST ARTHROSCOPY WITH DEBRIDEMENT Left 02/27/2018   Procedure: WRIST ARTHROSCOPY WITH DEBRIDEMENT;  Surgeon: Betha Loa, MD;  Location: [facility];  Service: Orthopedics;  Laterality: Left;    No family history on file.  Allergies  Allergen Reactions  . Advair Diskus [Fluticasone-Salmeterol] Other (See Comments)    "dries my throat out really bad'  . Lipitor [Atorvastatin Calcium] Other (See Comments)    Elevations in blood pressure?    Current Outpatient Medications on File Prior to Visit  Medication Sig Dispense Refill  . Cholecalciferol (VITAMIN D3 PO) Take 1,000 Units by mouth.    . docusate sodium (COLACE) 100 MG capsule Take 100 mg by mouth daily.    Marland Kitchen esomeprazole (NEXIUM) 40 MG capsule TAKE 1 CAPSULE BY MOUTH TWICE A DAY 180 capsule 0  . Misc Natural Products (TUMERSAID PO) Take 500 mg by mouth daily.    . Multiple Vitamin (MULTIVITAMIN) capsule Take 1 capsule by mouth daily.    . vitamin B-12 (CYANOCOBALAMIN) 1000 MCG tablet Take 1,000 mcg by mouth daily.    . vitamin C (ASCORBIC ACID) 500 MG tablet Take 500 mg by mouth daily.    . vitamin E 100 UNIT capsule Take 400 Units by mouth daily.     No current facility-administered medications on file prior to visit.     BP 124/76   Pulse 82   Temp 97.9 F (36.6 C) (Temporal)   Ht 5\' 7"  (1.702 m)   Wt 223 lb 8 oz (101.4 kg)   SpO2 98%   BMI 35.01 kg/m    Objective:   Physical Exam  Constitutional: She is oriented to person, place, and time. She appears well-nourished.  HENT:  Right Ear: Tympanic membrane and ear canal normal.  Left Ear: Tympanic membrane and ear canal normal.  Mouth/Throat: Oropharynx is clear and moist.  Eyes:  Pupils are equal, round, and reactive to light. EOM are normal.  Neck: Neck supple.  Cardiovascular: Normal rate and regular rhythm.  Respiratory: Effort normal and breath sounds normal.  GI: Soft. Bowel sounds are normal. There is no abdominal tenderness.  Musculoskeletal: Normal range of motion.  Neurological: She is alert and oriented  to person, place, and time. No cranial nerve deficit.  Reflex Scores:      Patellar reflexes are 2+ on the right side and 2+ on the left side. Skin: Skin is warm and dry.  Psychiatric: She has a normal mood and affect.           Assessment & Plan:

## 2019-03-02 ENCOUNTER — Other Ambulatory Visit: Payer: Self-pay | Admitting: Primary Care

## 2019-03-02 DIAGNOSIS — J4520 Mild intermittent asthma, uncomplicated: Secondary | ICD-10-CM

## 2019-03-03 NOTE — Telephone Encounter (Signed)
How's she doing on the Symbicort inhaler? Any improvement? How often is she using her albuterol inhaler now?

## 2019-03-03 NOTE — Telephone Encounter (Signed)
Last prescribed on 02/02/2019 . Last appointment on 02/02/2019. No future appointment

## 2019-03-04 ENCOUNTER — Other Ambulatory Visit: Payer: Self-pay | Admitting: Primary Care

## 2019-03-04 DIAGNOSIS — J4520 Mild intermittent asthma, uncomplicated: Secondary | ICD-10-CM

## 2019-03-04 NOTE — Telephone Encounter (Signed)
Last prescribed on 02/02/2019 . Last appointment on 02/02/2019. No future appointment 

## 2019-03-04 NOTE — Telephone Encounter (Signed)
She shouldn't be out of this inhaler since we refilled one month ago. I know she is getting back in touch with me regarding coverage of other inhalers.

## 2019-03-04 NOTE — Telephone Encounter (Signed)
Spoken to patient and she stated that she sent a MyChart message  She noticed some improvement and she is still using more often then she should 2-3

## 2019-03-29 ENCOUNTER — Other Ambulatory Visit: Payer: Self-pay | Admitting: Primary Care

## 2019-03-29 DIAGNOSIS — E785 Hyperlipidemia, unspecified: Secondary | ICD-10-CM

## 2019-04-03 ENCOUNTER — Other Ambulatory Visit: Payer: Self-pay

## 2019-04-03 ENCOUNTER — Ambulatory Visit
Admission: RE | Admit: 2019-04-03 | Discharge: 2019-04-03 | Disposition: A | Discharge status: Home / Self Care | Payer: 59 | Source: Ambulatory Visit | Attending: Primary Care | Admitting: Primary Care

## 2019-04-03 DIAGNOSIS — Z1231 Encounter for screening mammogram for malignant neoplasm of breast: Secondary | ICD-10-CM

## 2019-04-07 ENCOUNTER — Other Ambulatory Visit (INDEPENDENT_AMBULATORY_CARE_PROVIDER_SITE_OTHER): Payer: 59

## 2019-04-07 ENCOUNTER — Other Ambulatory Visit: Payer: Self-pay

## 2019-04-07 DIAGNOSIS — E785 Hyperlipidemia, unspecified: Secondary | ICD-10-CM | POA: Diagnosis not present

## 2019-04-07 LAB — LIPID PANEL
Cholesterol: 145 mg/dL (ref 0–200)
HDL: 46 mg/dL (ref >39.00)
LDL Cholesterol: 78 mg/dL (ref 0–99)
NonHDL: 99.42
Total CHOL/HDL Ratio: 3
Triglycerides: 105 mg/dL (ref 0.0–149.0)
VLDL: 21 mg/dL (ref 0.0–40.0)

## 2019-04-07 LAB — HEPATIC FUNCTION PANEL
ALT: 12 U/L (ref 0–35)
AST: 12 U/L (ref 0–37)
Albumin: 4.4 g/dL (ref 3.5–5.2)
Alkaline Phosphatase: 38 U/L — ABNORMAL LOW (ref 39–117)
Bilirubin, Direct: 0.2 mg/dL (ref 0.0–0.3)
Total Bilirubin: 0.7 mg/dL (ref 0.2–1.2)
Total Protein: 6.6 g/dL (ref 6.0–8.3)

## 2019-04-27 ENCOUNTER — Other Ambulatory Visit: Payer: Self-pay | Admitting: Primary Care

## 2019-04-27 DIAGNOSIS — J4520 Mild intermittent asthma, uncomplicated: Secondary | ICD-10-CM

## 2019-06-24 ENCOUNTER — Other Ambulatory Visit: Payer: Self-pay | Admitting: Primary Care

## 2019-06-24 DIAGNOSIS — E785 Hyperlipidemia, unspecified: Secondary | ICD-10-CM

## 2019-07-07 ENCOUNTER — Other Ambulatory Visit (INDEPENDENT_AMBULATORY_CARE_PROVIDER_SITE_OTHER): Payer: 59

## 2019-07-07 DIAGNOSIS — E785 Hyperlipidemia, unspecified: Secondary | ICD-10-CM

## 2019-07-07 LAB — LIPID PANEL
Cholesterol: 252 mg/dL — ABNORMAL HIGH (ref 0–200)
HDL: 48.7 mg/dL (ref >39.00)
LDL Cholesterol: 164 mg/dL — ABNORMAL HIGH (ref 0–99)
NonHDL: 202.82
Total CHOL/HDL Ratio: 5
Triglycerides: 193 mg/dL — ABNORMAL HIGH (ref 0.0–149.0)
VLDL: 38.6 mg/dL (ref 0.0–40.0)

## 2019-07-22 ENCOUNTER — Other Ambulatory Visit: Payer: Self-pay | Admitting: Primary Care

## 2019-07-22 DIAGNOSIS — J4520 Mild intermittent asthma, uncomplicated: Secondary | ICD-10-CM

## 2019-07-22 DIAGNOSIS — K219 Gastro-esophageal reflux disease without esophagitis: Secondary | ICD-10-CM

## 2019-07-29 ENCOUNTER — Other Ambulatory Visit: Payer: Self-pay | Admitting: Primary Care

## 2019-07-29 DIAGNOSIS — F411 Generalized anxiety disorder: Secondary | ICD-10-CM

## 2019-07-30 MED ORDER — HYDROXYZINE PAMOATE 50 MG PO CAPS: ORAL_CAPSULE | ORAL | 1 refills | Status: DC

## 2019-07-30 NOTE — Telephone Encounter (Signed)
It was change on 02/02/2019 which was a CPE

## 2019-08-16 ENCOUNTER — Other Ambulatory Visit: Payer: Self-pay | Admitting: Primary Care

## 2019-08-16 DIAGNOSIS — J4520 Mild intermittent asthma, uncomplicated: Secondary | ICD-10-CM

## 2019-10-25 ENCOUNTER — Other Ambulatory Visit: Payer: Self-pay | Admitting: Primary Care

## 2019-10-25 DIAGNOSIS — J4520 Mild intermittent asthma, uncomplicated: Secondary | ICD-10-CM

## 2019-11-18 ENCOUNTER — Other Ambulatory Visit: Payer: Self-pay | Admitting: Primary Care

## 2019-11-18 DIAGNOSIS — J4520 Mild intermittent asthma, uncomplicated: Secondary | ICD-10-CM

## 2020-01-13 ENCOUNTER — Other Ambulatory Visit: Payer: Self-pay | Admitting: Primary Care

## 2020-01-13 DIAGNOSIS — K219 Gastro-esophageal reflux disease without esophagitis: Secondary | ICD-10-CM

## 2020-01-13 DIAGNOSIS — J4540 Moderate persistent asthma, uncomplicated: Secondary | ICD-10-CM

## 2020-01-13 DIAGNOSIS — E785 Hyperlipidemia, unspecified: Secondary | ICD-10-CM

## 2020-02-09 ENCOUNTER — Other Ambulatory Visit: Payer: Self-pay | Admitting: Primary Care

## 2020-02-09 DIAGNOSIS — J4520 Mild intermittent asthma, uncomplicated: Secondary | ICD-10-CM

## 2020-02-09 DIAGNOSIS — E785 Hyperlipidemia, unspecified: Secondary | ICD-10-CM

## 2020-02-09 DIAGNOSIS — K219 Gastro-esophageal reflux disease without esophagitis: Secondary | ICD-10-CM

## 2020-03-01 ENCOUNTER — Encounter: Payer: 59 | Admitting: Primary Care

## 2020-03-08 ENCOUNTER — Other Ambulatory Visit: Payer: Self-pay | Admitting: Primary Care

## 2020-03-08 DIAGNOSIS — E785 Hyperlipidemia, unspecified: Secondary | ICD-10-CM

## 2020-03-08 DIAGNOSIS — K219 Gastro-esophageal reflux disease without esophagitis: Secondary | ICD-10-CM

## 2020-03-08 NOTE — Telephone Encounter (Signed)
Pharmacy requests refill on: Esomeprazole 40 mg & Rosuvastatin 5 mg   LAST REFILL: 01/14/2020 LAST OV: 02/02/2019 NEXT OV: 03/17/2020 PHARMACY: [id number] [place], [place]

## 2020-03-17 ENCOUNTER — Ambulatory Visit (INDEPENDENT_AMBULATORY_CARE_PROVIDER_SITE_OTHER)
Admission: RE | Admit: 2020-03-17 | Discharge: 2020-03-17 | Disposition: A | Discharge status: Home / Self Care | Payer: 59 | Source: Ambulatory Visit | Attending: Primary Care | Admitting: Primary Care

## 2020-03-17 ENCOUNTER — Other Ambulatory Visit: Payer: Self-pay | Admitting: Primary Care

## 2020-03-17 ENCOUNTER — Encounter: Payer: Self-pay | Admitting: Primary Care

## 2020-03-17 ENCOUNTER — Ambulatory Visit (INDEPENDENT_AMBULATORY_CARE_PROVIDER_SITE_OTHER): Payer: 59 | Admitting: Primary Care

## 2020-03-17 ENCOUNTER — Other Ambulatory Visit: Payer: Self-pay

## 2020-03-17 VITALS — BP 140/82 | HR 86 | Temp 98.1°F | Ht 68.5 in | Wt 229.0 lb

## 2020-03-17 DIAGNOSIS — I10 Essential (primary) hypertension: Secondary | ICD-10-CM

## 2020-03-17 DIAGNOSIS — J4540 Moderate persistent asthma, uncomplicated: Secondary | ICD-10-CM | POA: Diagnosis not present

## 2020-03-17 DIAGNOSIS — G43709 Chronic migraine without aura, not intractable, without status migrainosus: Secondary | ICD-10-CM | POA: Diagnosis not present

## 2020-03-17 DIAGNOSIS — R519 Headache, unspecified: Secondary | ICD-10-CM

## 2020-03-17 DIAGNOSIS — R0602 Shortness of breath: Secondary | ICD-10-CM | POA: Diagnosis not present

## 2020-03-17 DIAGNOSIS — Z0001 Encounter for general adult medical examination with abnormal findings: Secondary | ICD-10-CM

## 2020-03-17 DIAGNOSIS — F411 Generalized anxiety disorder: Secondary | ICD-10-CM

## 2020-03-17 DIAGNOSIS — E559 Vitamin D deficiency, unspecified: Secondary | ICD-10-CM | POA: Diagnosis not present

## 2020-03-17 DIAGNOSIS — E785 Hyperlipidemia, unspecified: Secondary | ICD-10-CM | POA: Diagnosis not present

## 2020-03-17 DIAGNOSIS — M5441 Lumbago with sciatica, right side: Secondary | ICD-10-CM

## 2020-03-17 DIAGNOSIS — Z01419 Encounter for gynecological examination (general) (routine) without abnormal findings: Secondary | ICD-10-CM

## 2020-03-17 DIAGNOSIS — Z1231 Encounter for screening mammogram for malignant neoplasm of breast: Secondary | ICD-10-CM

## 2020-03-17 DIAGNOSIS — Z0000 Encounter for general adult medical examination without abnormal findings: Secondary | ICD-10-CM

## 2020-03-17 DIAGNOSIS — M1990 Unspecified osteoarthritis, unspecified site: Secondary | ICD-10-CM

## 2020-03-17 DIAGNOSIS — K219 Gastro-esophageal reflux disease without esophagitis: Secondary | ICD-10-CM

## 2020-03-17 DIAGNOSIS — G8929 Other chronic pain: Secondary | ICD-10-CM

## 2020-03-17 DIAGNOSIS — Z1211 Encounter for screening for malignant neoplasm of colon: Secondary | ICD-10-CM

## 2020-03-17 DIAGNOSIS — J4520 Mild intermittent asthma, uncomplicated: Secondary | ICD-10-CM

## 2020-03-17 LAB — COMPREHENSIVE METABOLIC PANEL
ALT: 51 U/L — ABNORMAL HIGH (ref 0–35)
AST: 29 U/L (ref 0–37)
Albumin: 4.8 g/dL (ref 3.5–5.2)
Alkaline Phosphatase: 43 U/L (ref 39–117)
BUN: 17 mg/dL (ref 6–23)
CO2: 30 mEq/L (ref 19–32)
Calcium: 9.7 mg/dL (ref 8.4–10.5)
Chloride: 103 mEq/L (ref 96–112)
Creatinine, Ser: 0.86 mg/dL (ref 0.40–1.20)
GFR: 81.44 mL/min (ref >60.00)
Glucose, Bld: 83 mg/dL (ref 70–99)
Potassium: 4.2 mEq/L (ref 3.5–5.1)
Sodium: 139 mEq/L (ref 135–145)
Total Bilirubin: 0.9 mg/dL (ref 0.2–1.2)
Total Protein: 7.2 g/dL (ref 6.0–8.3)

## 2020-03-17 LAB — LIPID PANEL
Cholesterol: 187 mg/dL (ref 0–200)
HDL: 54.2 mg/dL (ref >39.00)
LDL Cholesterol: 94 mg/dL (ref 0–99)
NonHDL: 132.97
Total CHOL/HDL Ratio: 3
Triglycerides: 196 mg/dL — ABNORMAL HIGH (ref 0.0–149.0)
VLDL: 39.2 mg/dL (ref 0.0–40.0)

## 2020-03-17 LAB — CBC
HCT: 39.4 % (ref 36.0–46.0)
Hemoglobin: 13.4 g/dL (ref 12.0–15.0)
MCHC: 34.1 g/dL (ref 30.0–36.0)
MCV: 87.3 fl (ref 78.0–100.0)
Platelets: 204 10*3/uL (ref 150.0–400.0)
RBC: 4.51 Mil/uL (ref 3.87–5.11)
RDW: 12.7 % (ref 11.5–15.5)
WBC: 5.7 10*3/uL (ref 4.0–10.5)

## 2020-03-17 LAB — TSH: TSH: 0.89 u[IU]/mL (ref 0.35–4.50)

## 2020-03-17 LAB — VITAMIN D 25 HYDROXY (VIT D DEFICIENCY, FRACTURES): VITD: 44.39 ng/mL (ref 30.00–100.00)

## 2020-03-17 LAB — HEMOGLOBIN A1C: Hgb A1c MFr Bld: 5.5 % (ref 4.6–6.5)

## 2020-03-17 MED ORDER — KETOROLAC TROMETHAMINE 60 MG/2ML IM SOLN
60.0000 mg | Freq: Once | INTRAMUSCULAR | Status: AC
  Administered 2020-03-17: 60 mg via INTRAMUSCULAR

## 2020-03-17 MED ORDER — SUMATRIPTAN SUCCINATE 50 MG PO TABS: ORAL_TABLET | ORAL | 0 refills | Status: DC

## 2020-03-17 MED ORDER — DULOXETINE HCL 20 MG PO CPEP: 20.0000 mg | ORAL_CAPSULE | Freq: Every day | ORAL | 1 refills | Status: DC

## 2020-03-17 MED ORDER — MONTELUKAST SODIUM 10 MG PO TABS: 10.0000 mg | ORAL_TABLET | Freq: Every day | ORAL | 0 refills | Status: DC

## 2020-03-17 NOTE — Assessment & Plan Note (Signed)
Uncontrolled, taking hydroxyzine 3 times daily with temporary improvement.  Discussed that this is not best practice, that she needed some form of daily longer-lasting medication such as SSRI or SNRI for treatment.  She did not feel well on fluoxetine or escitalopram. Prescription for duloxetine 20 mg sent to pharmacy.  She will update in 4 weeks.

## 2020-03-17 NOTE — Patient Instructions (Addendum)
Stop levocetirizine (Xyzal) 5 mg for allergies.  Start montelukast (Singulair) 10 mg at bedtime for allergies.   Start duloxetine (Cymbalta) 20 mg once daily for anxiety and pain. Please update me in about 4 weeks as discussed.  You may take sumatriptan (Imitrex) at migraine onset. May repeat in 2 hours if migraine persists.   You can take the hydroxyzine twice daily only.   You will be contacted regarding your referral to the pulmonologist, GYN, and also for your colonoscopy.  Please let us know if you have not been contacted within two weeks.   Stop by the lab and xray prior to leaving today. I will notify you of your results once received.   It was a pleasure to see you today!   Preventive Care 40-64 Years Old, Female Preventive care refers to lifestyle choices and visits with your health care provider that can promote health and wellness. This includes:  A yearly physical exam. This is also called an annual wellness visit.  Regular dental and eye exams.  Immunizations.  Screening for certain conditions.  Healthy lifestyle choices, such as: ? Eating a healthy diet. ? Getting regular exercise. ? Not using drugs or products that contain nicotine and tobacco. ? Limiting alcohol use. What can I expect for my preventive care visit? Physical exam Your health care provider will check your:  Height and weight. These may be used to calculate your BMI (body mass index). BMI is a measurement that tells if you are at a healthy weight.  Heart rate and blood pressure.  Body temperature.  Skin for abnormal spots. Counseling Your health care provider may ask you questions about your:  Past medical problems.  Family's medical history.  Alcohol, tobacco, and drug use.  Emotional well-being.  Home life and relationship well-being.  Sexual activity.  Diet, exercise, and sleep habits.  Work and work environment.  Access to firearms.  Method of birth  control.  Menstrual cycle.  Pregnancy history. What immunizations do I need? Vaccines are usually given at various ages, according to a schedule. Your health care provider will recommend vaccines for you based on your age, medical history, and lifestyle or other factors, such as travel or where you work.   What tests do I need? Blood tests  Lipid and cholesterol levels. These may be checked every 5 years, or more often if you are over 50 years old.  Hepatitis C test.  Hepatitis B test. Screening  Lung cancer screening. You may have this screening every year starting at age 55 if you have a 30-pack-year history of smoking and currently smoke or have quit within the past 15 years.  Colorectal cancer screening. ? All adults should have this screening starting at age 50 and continuing until age 75. ? Your health care provider may recommend screening at age 45 if you are at increased risk. ? You will have tests every 1-10 years, depending on your results and the type of screening test.  Diabetes screening. ? This is done by checking your blood sugar (glucose) after you have not eaten for a while (fasting). ? You may have this done every 1-3 years.  Mammogram. ? This may be done every 1-2 years. ? Talk with your health care provider about when you should start having regular mammograms. This may depend on whether you have a family history of breast cancer.  BRCA-related cancer screening. This may be done if you have a family history of breast, ovarian, tubal, or peritoneal cancers.    Pelvic exam and Pap test. ? This may be done every 3 years starting at age 21. ? Starting at age 30, this may be done every 5 years if you have a Pap test in combination with an HPV test. Other tests  STD (sexually transmitted disease) testing, if you are at risk.  Bone density scan. This is done to screen for osteoporosis. You may have this scan if you are at high risk for osteoporosis. Talk with your  health care provider about your test results, treatment options, and if necessary, the need for more tests. Follow these instructions at home: Eating and drinking  Eat a diet that includes fresh fruits and vegetables, whole grains, lean protein, and low-fat dairy products.  Take vitamin and mineral supplements as recommended by your health care provider.  Do not drink alcohol if: ? Your health care provider tells you not to drink. ? You are pregnant, may be pregnant, or are planning to become pregnant.  If you drink alcohol: ? Limit how much you have to 0-1 drink a day. ? Be aware of how much alcohol is in your drink. In the [place] one drink equals one 12 oz bottle of beer (355 mL), one 5 oz glass of wine (148 mL), or one 1 oz glass of hard liquor (44 mL).   Lifestyle  Take daily care of your teeth and gums. Brush your teeth every morning and night with fluoride toothpaste. Floss one time each day.  Stay active. Exercise for at least 30 minutes 5 or more days each week.  Do not use any products that contain nicotine or tobacco, such as cigarettes, e-cigarettes, and chewing tobacco. If you need help quitting, ask your health care provider.  Do not use drugs.  If you are sexually active, practice safe sex. Use a condom or other form of protection to prevent STIs (sexually transmitted infections).  If you do not wish to become pregnant, use a form of birth control. If you plan to become pregnant, see your health care provider for a prepregnancy visit.  If told by your health care provider, take low-dose aspirin daily starting at age 50.  Find healthy ways to cope with stress, such as: ? Meditation, yoga, or listening to music. ? Journaling. ? Talking to a trusted person. ? Spending time with friends and family. Safety  Always wear your seat belt while driving or riding in a vehicle.  Do not drive: ? If you have been drinking alcohol. Do not ride with someone who has been  drinking. ? When you are tired or distracted. ? While texting.  Wear a helmet and other protective equipment during sports activities.  If you have firearms in your house, make sure you follow all gun safety procedures. What's next?  Visit your health care provider once a year for an annual wellness visit.  Ask your health care provider how often you should have your eyes and teeth checked.  Stay up to date on all vaccines. This information is not intended to replace advice given to you by your health care provider. Make sure you discuss any questions you have with your health care provider. Document Revised: 11/17/2019 Document Reviewed: 10/24/2017 Elsevier Patient Education  2021 Elsevier Inc.  

## 2020-03-17 NOTE — Assessment & Plan Note (Signed)
Discussed the importance of a healthy diet and regular exercise in order for weight loss, and to reduce the risk of any potential medical problems.  Repeat lipid panel pending. Continue rosuvastatin daily.

## 2020-03-17 NOTE — Assessment & Plan Note (Signed)
Chronic and ongoing, will initiate Cymbalta to see if this helps with pain while also treating anxiety.  She will update.

## 2020-03-17 NOTE — Assessment & Plan Note (Signed)
Chronic, must have Nexium 40 mg twice daily due to ongoing symptoms.  Encouraged weight loss.

## 2020-03-17 NOTE — Assessment & Plan Note (Signed)
Chronic, following with orthopedics to obtain injections to shoulder and hips.  Will trial Cymbalta to see if this helps with pain and anxiety.  She will update.

## 2020-03-17 NOTE — Assessment & Plan Note (Signed)
Above goal in the office today, also with weight gain over the last year.  Poor diet and not exercising.  She would like to work on weight loss through healthy diet and exercise in order to lower blood pressure.  Continue to monitor.

## 2020-03-17 NOTE — Progress Notes (Signed)
Subjective:    Patient ID: Hannah Klein, female    DOB: November 08, 1975, 45 y.o.   MRN: [id number]  HPI  This visit occurred during the SARS-CoV-2 public health emergency.  Safety protocols were in place, including screening questions prior to the visit, additional usage of staff PPE, and extensive cleaning of exam room while observing appropriate contact time as indicated for disinfecting solutions.   Hannah Klein is a 45 year old female who presents today for complete physical.  She would like to also discuss several other issues.  She would also like to discuss chronic shortness of breath. She often feels that she cannot get in a good enough breath. She denies wheezing. She will use her albuterol inhaler with temporary improvement, she is using this once daily, everyday. She was evaluated by asthma and allergy specialists in [place] years ago, underwent a lot of prior treatment but never had relief.  She could not tolerate several maintenance inhalers.  She has suffered with asthma for years, has not seen a pulmonologist locally.  She was once on Singulair which helped, but became ineffective.  She is taking Xyzal 5 mg twice daily which has since become ineffective.  She would also like to discuss an acute headache. This headache began about one week ago, located to mid parietal lobe with radiation to mid frontal lobe. She's been taking Tylenol without improvement. Migraines occur once monthly on average and are typically tolerable.  She does not have prescription abortive treatment for migraines.  She has chronic anxiety and is taking hydroxyzine three times daily, every day with temporary improvement. Symptoms include feeling jittery, feeling anxious. She's been on Prozac and Lexapro in the past but didn't like the way it made her feel.  She does not want to be on anything that can cause dependence.  She does have chronic pain, has never been on Cymbalta.  Immunizations: -Tetanus:  Completed in 2019 -Influenza: Declines -Covid-19: Has not completed  Diet: She endorses a poor diet.  Exercise: She is not exercising. Some walking.   Eye exam: No recent exam Dental exam: No recent exam   Pap Smear: Completed in 2017 Mammogram: Completed in February 2021 Colonoscopy: Completed last in 2011  BP Readings from Last 3 Encounters:  03/17/20 140/82  02/02/19 124/76  02/27/18 125/60   Wt Readings from Last 3 Encounters:  03/17/20 229 lb (103.9 kg)  02/02/19 223 lb 8 oz (101.4 kg)  02/27/18 215 lb 9.8 oz (97.8 kg)     Review of Systems  Constitutional: Negative for unexpected weight change.  HENT: Negative for rhinorrhea.   Eyes: Negative for visual disturbance.  Respiratory: Positive for chest tightness and shortness of breath. Negative for cough and wheezing.   Cardiovascular: Negative for chest pain.  Gastrointestinal: Negative for constipation and diarrhea.  Genitourinary: Negative for difficulty urinating.  Musculoskeletal: Positive for arthralgias.  Skin: Negative for rash.  Allergic/Immunologic: Positive for environmental allergies.  Neurological: Positive for headaches. Negative for dizziness and numbness.  Hematological: Negative for adenopathy.  Psychiatric/Behavioral: The patient is nervous/anxious.        Past Medical History:  Diagnosis Date  . Arthritis   . Asthma   . Environmental allergies   . Generalized anxiety disorder   . GERD (gastroesophageal reflux disease)   . Hx of colonic polyps   . Hypertension   . Migraines   . MRSA (methicillin resistant Staphylococcus aureus)    Lt upper arm  approx 8 years ago  Social History   Socioeconomic History  . Marital status: Single    Spouse name: Not on file  . Number of children: Not on file  . Years of education: Not on file  . Highest education level: Not on file  Occupational History  . Not on file  Tobacco Use  . Smoking status: Former Smoker    Quit date: 2009    Years  since quitting: 13.0  . Smokeless tobacco: Never Used  . Tobacco comment: quit smoking 2009  Substance and Sexual Activity  . Alcohol use: No    Alcohol/week: 0.0 standard drinks  . Drug use: No  . Sexual activity: Not on file  Other Topics Concern  . Not on file  Social History Narrative   Single.   Moved from [place].   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   Social Determinants of Health   Financial Resource Strain: Not on file  Food Insecurity: Not on file  Transportation Needs: Not on file  Physical Activity: Not on file  Stress: Not on file  Social Connections: Not on file  Intimate Partner Violence: Not on file    Past Surgical History:  Procedure Laterality Date  . CHOLECYSTECTOMY    . DILATION AND CURETTAGE OF UTERUS    . NECK SURGERY     Fusion to C6 and C7  . WRIST ARTHROSCOPY WITH DEBRIDEMENT Left 02/27/2018   Procedure: WRIST ARTHROSCOPY WITH DEBRIDEMENT;  Surgeon: Hannah Loa, MD;  Location: [facility];  Service: Orthopedics;  Laterality: Left;    History reviewed. No pertinent family history.  Allergies  Allergen Reactions  . Advair Diskus [Fluticasone-Salmeterol] Other (See Comments)    "dries my throat out really bad'  . Lipitor [Atorvastatin Calcium] Other (See Comments)    Elevations in blood pressure?    Current Outpatient Medications on File Prior to Visit  Medication Sig Dispense Refill  . albuterol (VENTOLIN HFA) 108 (90 Base) MCG/ACT inhaler INHALE 1-2 PUFFS INTO THE LUNGS EVERY 6 (SIX) HOURS AS NEEDED FOR WHEEZING OR SHORTNESS OF BREATH. 18 each 0  . Cholecalciferol (VITAMIN D3 PO) Take 1,000 Units by mouth.    . docusate sodium (COLACE) 100 MG capsule Take 100 mg by mouth daily.    Marland Kitchen esomeprazole (NEXIUM) 40 MG capsule TAKE 1 CAPSULE BY MOUTH TWICE A DAY 60 capsule 0  . hydrOXYzine (VISTARIL) 50 MG capsule TAKE 1 CAPSULE BY MOUTH twice daily AS NEEDED FOR ANXIETY 180 capsule 1  . Misc Natural Products (TUMERSAID PO) Take 500 mg by  mouth daily.    . Multiple Vitamin (MULTIVITAMIN) capsule Take 1 capsule by mouth daily.    . rosuvastatin (CRESTOR) 5 MG tablet TAKE 1 TABLET BY MOUTH EVERY EVENING FOR CHOLESTEROL 30 tablet 0  . vitamin B-12 (CYANOCOBALAMIN) 1000 MCG tablet Take 1,000 mcg by mouth daily.    . vitamin C (ASCORBIC ACID) 500 MG tablet Take 500 mg by mouth daily.    . vitamin E 100 UNIT capsule Take 400 Units by mouth daily.     No current facility-administered medications on file prior to visit.    BP 140/82   Pulse 86   Temp 98.1 F (36.7 C) (Temporal)   Ht 5' 8.5" (1.74 m)   Wt 229 lb (103.9 kg)   LMP 03/17/2020   SpO2 98%   BMI 34.31 kg/m    Objective:   Physical Exam Constitutional:      Appearance: She is well-nourished.     Comments: She  has to hold her mask to her mouth as she cannot stand the mask strap to her face.  This causes shortness of breath  HENT:     Right Ear: Tympanic membrane and ear canal normal.     Left Ear: Tympanic membrane and ear canal normal.     Mouth/Throat:     Mouth: Oropharynx is clear and moist.  Eyes:     Extraocular Movements: EOM normal.     Pupils: Pupils are equal, round, and reactive to light.  Cardiovascular:     Rate and Rhythm: Normal rate and regular rhythm.  Pulmonary:     Effort: Pulmonary effort is normal.     Breath sounds: Normal breath sounds.  Abdominal:     General: Bowel sounds are normal.     Palpations: Abdomen is soft.     Tenderness: There is no abdominal tenderness.  Musculoskeletal:        General: Normal range of motion.     Cervical back: Neck supple.  Skin:    General: Skin is warm and dry.  Neurological:     Mental Status: She is alert and oriented to person, place, and time.     Cranial Nerves: No cranial nerve deficit.     Deep Tendon Reflexes:     Reflex Scores:      Patellar reflexes are 2+ on the right side and 2+ on the left side. Psychiatric:        Mood and Affect: Mood and affect and mood normal.             Assessment & Plan:

## 2020-03-17 NOTE — Assessment & Plan Note (Signed)
Uncontrolled, daily use of rescue inhaler which is an appropriate.  Apparently she has not tolerated maintenance inhalers in the past and/or prior treatment was ineffective.  We will resume Singulair. Reduce Xyzal to once daily.  Refer referral placed to pulmonology for evaluation and treatment.

## 2020-03-17 NOTE — Assessment & Plan Note (Signed)
Monthly migraines, recent headache for the last 7 days. Prescription for sumatriptan 50 mg sent to pharmacy for abortive treatment.    Discussed potential need for preventative headache treatment if headaches do not improve.  IM Toradol 60 mg provided today.

## 2020-03-17 NOTE — Assessment & Plan Note (Signed)
Tetanus up-to-date.  Declines influenza vaccine. Mammogram due, orders placed. Pap smear overdue, she prefers to see GYN, referral placed. Colonoscopy due, referral to GI placed.  Discussed the importance of a healthy diet and regular exercise in order for weight loss, and to reduce the risk of any potential medical problems.  Exam today as noted. Labs pending.

## 2020-03-18 DIAGNOSIS — G43709 Chronic migraine without aura, not intractable, without status migrainosus: Secondary | ICD-10-CM

## 2020-03-18 MED ORDER — RIZATRIPTAN BENZOATE 10 MG PO TABS: ORAL_TABLET | ORAL | 0 refills | Status: DC

## 2020-03-18 NOTE — Telephone Encounter (Signed)
Ok to fill 

## 2020-03-18 NOTE — Telephone Encounter (Signed)
Refills sent to pharmacy. 

## 2020-04-01 ENCOUNTER — Other Ambulatory Visit: Payer: Self-pay | Admitting: Primary Care

## 2020-04-01 DIAGNOSIS — E785 Hyperlipidemia, unspecified: Secondary | ICD-10-CM

## 2020-04-01 NOTE — Telephone Encounter (Signed)
Refill request Crestor Last office visit 03/17/20 Last refill 03/08/20 #30

## 2020-04-01 NOTE — Telephone Encounter (Signed)
Refills sent to pharmacy. 

## 2020-04-04 ENCOUNTER — Encounter: Payer: Self-pay | Admitting: Emergency Medicine

## 2020-04-04 ENCOUNTER — Ambulatory Visit (INDEPENDENT_AMBULATORY_CARE_PROVIDER_SITE_OTHER): Payer: 59 | Admitting: Emergency Medicine

## 2020-04-04 ENCOUNTER — Other Ambulatory Visit: Payer: Self-pay

## 2020-04-04 ENCOUNTER — Other Ambulatory Visit (INDEPENDENT_AMBULATORY_CARE_PROVIDER_SITE_OTHER): Payer: 59

## 2020-04-04 DIAGNOSIS — J4540 Moderate persistent asthma, uncomplicated: Secondary | ICD-10-CM | POA: Diagnosis not present

## 2020-04-04 DIAGNOSIS — R0602 Shortness of breath: Secondary | ICD-10-CM

## 2020-04-04 DIAGNOSIS — K219 Gastro-esophageal reflux disease without esophagitis: Secondary | ICD-10-CM | POA: Diagnosis not present

## 2020-04-04 DIAGNOSIS — J310 Chronic rhinitis: Secondary | ICD-10-CM | POA: Diagnosis not present

## 2020-04-04 DIAGNOSIS — R0683 Snoring: Secondary | ICD-10-CM

## 2020-04-04 DIAGNOSIS — G4733 Obstructive sleep apnea (adult) (pediatric): Secondary | ICD-10-CM | POA: Insufficient documentation

## 2020-04-04 DIAGNOSIS — R0600 Dyspnea, unspecified: Secondary | ICD-10-CM | POA: Insufficient documentation

## 2020-04-04 LAB — CBC WITH DIFFERENTIAL/PLATELET
Basophils Absolute: 0 10*3/uL (ref 0.0–0.1)
Basophils Relative: 0.3 % (ref 0.0–3.0)
Eosinophils Absolute: 0 10*3/uL (ref 0.0–0.7)
Eosinophils Relative: 0.4 % (ref 0.0–5.0)
HCT: 38.8 % (ref 36.0–46.0)
Hemoglobin: 13.5 g/dL (ref 12.0–15.0)
Lymphocytes Relative: 25.3 % (ref 12.0–46.0)
Lymphs Abs: 1.6 10*3/uL (ref 0.7–4.0)
MCHC: 34.7 g/dL (ref 30.0–36.0)
MCV: 86.4 fl (ref 78.0–100.0)
Monocytes Absolute: 0.5 10*3/uL (ref 0.1–1.0)
Monocytes Relative: 7.3 % (ref 3.0–12.0)
Neutro Abs: 4.2 10*3/uL (ref 1.4–7.7)
Neutrophils Relative %: 66.7 % (ref 43.0–77.0)
Platelets: 181 10*3/uL (ref 150.0–400.0)
RBC: 4.49 Mil/uL (ref 3.87–5.11)
RDW: 12.9 % (ref 11.5–15.5)
WBC: 6.4 10*3/uL (ref 4.0–10.5)

## 2020-04-04 MED ORDER — FLUTICASONE PROPIONATE 50 MCG/ACT NA SUSP: 1.0000 | Freq: Every day | NASAL | 2 refills | Status: DC

## 2020-04-04 NOTE — Assessment & Plan Note (Signed)
Slow progressive dyspnea over many years.  This is not consistent with asthma, no cough, no wheeze, no response to bronchodilator.  Likely multifactorial.  Some degree of deconditioning and weight gain.  Consider also other causes of restrictive lung disease.  Chest x-ray without any clear ILD.  She needs pulmonary function testing to quantify her degree of obstruction, assess for restrictive disease.  I will also check CT chest to look for interstitial disease.  She might benefit from a cardiopulmonary exercise test depending on these results.  I will adjust her bronchodilator therapy depending on PFT, for now continue Singulair and albuterol if needed.

## 2020-04-04 NOTE — Progress Notes (Signed)
Subjective:    Patient ID: Hannah Klein, female    DOB: Dec 29, 1975, 45 y.o.   MRN: [id number]  HPI 45 year old woman, former smoker (35 pack years) with a history of allergic rhinitis, GERD, anxiety/depression, migraines, hypertension.  She carries a history of asthma that was diagnosed in her late teens.  She has been on maintenance therapy in the past, treated with but without significant relief of symptoms - was on Advair, QVAR, Symbicort.   She reports that she is dealing with slow progressive exertional SOB for over 7 years, not really having wheeze, cough. She has exertional SOB, chest heaviness. Her wt has been labile, has ups and downs +/- 20 lbs ranges 195 - 225. She reports difficult to take a deep breath in. She has nasal congestion. Sometimes feels that she get UA irritation and difficulty moving air through throat. She snores sometimes, wakes up during the night, tired during the day.  She is on Singulair, restarted 03/17/2020, uses albuterol rarely - doesn;t seem to help these sx.  Has been on immunotherapy in the past, in [place] She is on Xyzal, Nexium twice daily   Chest x-ray 03/17/2020 reviewed by me, shows mild bronchial wall thickening without any discrete infiltrates   Review of Systems As per HPI  Past Medical History:  Diagnosis Date  . Arthritis   . Asthma   . Environmental allergies   . Generalized anxiety disorder   . GERD (gastroesophageal reflux disease)   . Hx of colonic polyps   . Hypertension   . Migraines   . MRSA (methicillin resistant Staphylococcus aureus)    Lt upper arm  approx 8 years ago     No family history on file.   Social History   Socioeconomic History  . Marital status: Single    Spouse name: Not on file  . Number of children: Not on file  . Years of education: Not on file  . Highest education level: Not on file  Occupational History  . Not on file  Tobacco Use  . Smoking status: Former Smoker    Quit date: 2009    Years  since quitting: 13.1  . Smokeless tobacco: Never Used  . Tobacco comment: quit smoking 2009  Substance and Sexual Activity  . Alcohol use: No    Alcohol/week: 0.0 standard drinks  . Drug use: No  . Sexual activity: Not on file  Other Topics Concern  . Not on file  Social History Narrative   Single.   Moved from [place].   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   Social Determinants of Health   Financial Resource Strain: Not on file  Food Insecurity: Not on file  Transportation Needs: Not on file  Physical Activity: Not on file  Stress: Not on file  Social Connections: Not on file  Intimate Partner Violence: Not on file    Has lived in [place], [place], [place], [place], [place] Has worked in an office setting No other inhaled exposures.   Allergies  Allergen Reactions  . Advair Diskus [Fluticasone-Salmeterol] Other (See Comments)    "dries my throat out really bad'  . Lipitor [Atorvastatin Calcium] Other (See Comments)    Elevations in blood pressure?     Outpatient Medications Prior to Visit  Medication Sig Dispense Refill  . albuterol (VENTOLIN HFA) 108 (90 Base) MCG/ACT inhaler INHALE 1-2 PUFFS INTO THE LUNGS EVERY 6 (SIX) HOURS AS NEEDED FOR WHEEZING OR SHORTNESS OF BREATH. 18 each 0  . Cholecalciferol (VITAMIN D3  PO) Take 1,000 Units by mouth.    . docusate sodium (COLACE) 100 MG capsule Take 100 mg by mouth daily.    Marland Kitchen esomeprazole (NEXIUM) 40 MG capsule TAKE 1 CAPSULE BY MOUTH TWICE A DAY 60 capsule 0  . hydrOXYzine (VISTARIL) 50 MG capsule TAKE 1 CAPSULE BY MOUTH twice daily AS NEEDED FOR ANXIETY 180 capsule 1  . Misc Natural Products (TUMERSAID PO) Take 500 mg by mouth daily.    . montelukast (SINGULAIR) 10 MG tablet Take 1 tablet (10 mg total) by mouth at bedtime. For allergies. 90 tablet 0  . Multiple Vitamin (MULTIVITAMIN) capsule Take 1 capsule by mouth daily.    . rizatriptan (MAXALT) 10 MG tablet Take 1 tablet at migraine onset, may repeat in 2 hours if needed.  Do not exceed 2 tablets in  24 hours. 10 tablet 0  . rosuvastatin (CRESTOR) 5 MG tablet TAKE 1 TABLET BY MOUTH EVERY DAY IN THE EVENING FOR CHOLESTEROL 90 tablet 3  . vitamin B-12 (CYANOCOBALAMIN) 1000 MCG tablet Take 1,000 mcg by mouth daily.    . vitamin C (ASCORBIC ACID) 500 MG tablet Take 500 mg by mouth daily.    . vitamin E 100 UNIT capsule Take 400 Units by mouth daily.    . DULoxetine (CYMBALTA) 20 MG capsule Take 1 capsule (20 mg total) by mouth daily. For anxiety and pain. (Patient not taking: Reported on 04/04/2020) 30 capsule 1   No facility-administered medications prior to visit.         Objective:   Physical Exam Vitals:   04/04/20 0958  BP: 120/82  Pulse: 74  Temp: (!) 97.4 F (36.3 C)  SpO2: 99%  Weight: 233 lb (105.7 kg)  Height: 5\' 9"  (1.753 m)   Gen: Pleasant, overwt woman, in no distress,  normal affect  ENT: No lesions,  mouth clear,  oropharynx clear, no postnasal drip  Neck: No JVD, no stridor  Lungs: No use of accessory muscles, no crackles or wheezing on normal respiration, no wheeze on forced expiration  Cardiovascular: RRR, heart sounds normal, no murmur or gallops, no peripheral edema  Musculoskeletal: No deformities, no cyanosis or clubbing  Neuro: alert, awake, non focal  Skin: Warm, no lesions or rash      Assessment & Plan:  Dyspnea Slow progressive dyspnea over many years.  This is not consistent with asthma, no cough, no wheeze, no response to bronchodilator.  Likely multifactorial.  Some degree of deconditioning and weight gain.  Consider also other causes of restrictive lung disease.  Chest x-ray without any clear ILD.  She needs pulmonary function testing to quantify her degree of obstruction, assess for restrictive disease.  I will also check CT chest to look for interstitial disease.  She might benefit from a cardiopulmonary exercise test depending on these results.  I will adjust her bronchodilator therapy depending on PFT, for now continue Singulair and  albuterol if needed.  Asthma Not clear to me that this is poorly controlled based on her symptom description.  Continue Singulair, continue albuterol as needed.  Has been on scheduled bronchodilator therapy in the past, never really responded very well.  PFTs will be helpful  Chronic rhinitis On immunotherapy in the past and .  May have benefited some, did not get full relief.  Continues to have rhinitis.  Continue Singulair, Xyzal, and fluticasone nasal spray.  She may benefit from an allergy referral going forward.  GERD (gastroesophageal reflux disease) Continue Nexium twice a day.  Some of her symptoms do occasionally some numbness of upper airway obstruction.  Need to control GERD aggressively.  Snoring At risk for obstructive sleep apnea.  Certainly poorly controlled OSA may contribute to daytime symptoms.  Also consider any possible impact on pulmonary hypertension.  She may merit a PSG and echocardiogram going forward.  Levy Pupa, MD, PhD 04/04/2020, 10:42 AM [facility] [phone] or if no answer before 7:00PM call [phone] For any issues after 7:00PM please call eLink [phone]

## 2020-04-04 NOTE — Assessment & Plan Note (Signed)
Not clear to me that this is poorly controlled based on her symptom description.  Continue Singulair, continue albuterol as needed.  Has been on scheduled bronchodilator therapy in the past, never really responded very well.  PFTs will be helpful

## 2020-04-04 NOTE — Assessment & Plan Note (Signed)
Continue Nexium twice a day.  Some of her symptoms do occasionally some numbness of upper airway obstruction.  Need to control GERD aggressively.

## 2020-04-04 NOTE — Assessment & Plan Note (Signed)
On immunotherapy in the past and [place].  May have benefited some, did not get full relief.  Continues to have rhinitis.  Continue Singulair, Xyzal, and fluticasone nasal spray.  She may benefit from an allergy referral going forward.

## 2020-04-04 NOTE — Patient Instructions (Signed)
We will arrange for full pulmonary function testing in next office visit. We will perform a CT scan of your chest without contrast to evaluate shortness of breath Lab work Please continue Singulair 10 mg each evening Please continue your Xyzal once daily as you have been taking it. Please continue Nexium twice daily. Follow with Dr. Delton Coombes next available with full pulmonary function testing on the same day.

## 2020-04-04 NOTE — Assessment & Plan Note (Signed)
At risk for obstructive sleep apnea.  Certainly poorly controlled OSA may contribute to daytime symptoms.  Also consider any possible impact on pulmonary hypertension.  She may merit a PSG and echocardiogram going forward.

## 2020-04-05 LAB — IGE: IgE (Immunoglobulin E), Serum: 22 kU/L (ref <=114)

## 2020-04-08 ENCOUNTER — Other Ambulatory Visit: Payer: Self-pay | Admitting: Primary Care

## 2020-04-08 DIAGNOSIS — F411 Generalized anxiety disorder: Secondary | ICD-10-CM

## 2020-04-08 DIAGNOSIS — K219 Gastro-esophageal reflux disease without esophagitis: Secondary | ICD-10-CM

## 2020-04-09 ENCOUNTER — Other Ambulatory Visit: Payer: Self-pay | Admitting: Primary Care

## 2020-04-09 DIAGNOSIS — G43709 Chronic migraine without aura, not intractable, without status migrainosus: Secondary | ICD-10-CM

## 2020-04-10 ENCOUNTER — Other Ambulatory Visit: Payer: Self-pay | Admitting: Primary Care

## 2020-04-10 DIAGNOSIS — J4540 Moderate persistent asthma, uncomplicated: Secondary | ICD-10-CM

## 2020-04-11 ENCOUNTER — Other Ambulatory Visit: Payer: Self-pay | Admitting: Primary Care

## 2020-04-11 ENCOUNTER — Other Ambulatory Visit: Payer: Self-pay

## 2020-04-11 ENCOUNTER — Ambulatory Visit (INDEPENDENT_AMBULATORY_CARE_PROVIDER_SITE_OTHER)
Admission: RE | Admit: 2020-04-11 | Discharge: 2020-04-11 | Disposition: A | Discharge status: Home / Self Care | Payer: 59 | Source: Ambulatory Visit | Attending: Emergency Medicine | Admitting: Emergency Medicine

## 2020-04-11 DIAGNOSIS — R0602 Shortness of breath: Secondary | ICD-10-CM | POA: Diagnosis not present

## 2020-04-11 DIAGNOSIS — J4520 Mild intermittent asthma, uncomplicated: Secondary | ICD-10-CM

## 2020-04-11 NOTE — Telephone Encounter (Signed)
Patient following with pulmonology

## 2020-04-12 ENCOUNTER — Other Ambulatory Visit: Payer: Self-pay | Admitting: Primary Care

## 2020-04-12 DIAGNOSIS — G43709 Chronic migraine without aura, not intractable, without status migrainosus: Secondary | ICD-10-CM

## 2020-04-26 ENCOUNTER — Other Ambulatory Visit (HOSPITAL_COMMUNITY)
Admission: RE | Admit: 2020-04-26 | Discharge: 2020-04-26 | Disposition: A | Discharge status: Home / Self Care | Payer: 59 | Source: Ambulatory Visit | Attending: Emergency Medicine | Admitting: Emergency Medicine

## 2020-04-26 DIAGNOSIS — Z01812 Encounter for preprocedural laboratory examination: Secondary | ICD-10-CM | POA: Insufficient documentation

## 2020-04-26 DIAGNOSIS — Z20822 Contact with and (suspected) exposure to covid-19: Secondary | ICD-10-CM | POA: Insufficient documentation

## 2020-04-26 LAB — SARS CORONAVIRUS 2 (TAT 6-24 HRS): SARS Coronavirus 2: NEGATIVE

## 2020-04-29 ENCOUNTER — Ambulatory Visit (INDEPENDENT_AMBULATORY_CARE_PROVIDER_SITE_OTHER): Payer: 59 | Admitting: Emergency Medicine

## 2020-04-29 ENCOUNTER — Encounter: Payer: Self-pay | Admitting: Emergency Medicine

## 2020-04-29 ENCOUNTER — Other Ambulatory Visit: Payer: Self-pay

## 2020-04-29 VITALS — BP 128/80 | HR 78 | Temp 98.3°F | Ht 68.0 in | Wt 228.6 lb

## 2020-04-29 DIAGNOSIS — R0683 Snoring: Secondary | ICD-10-CM

## 2020-04-29 DIAGNOSIS — R0602 Shortness of breath: Secondary | ICD-10-CM | POA: Diagnosis not present

## 2020-04-29 DIAGNOSIS — R911 Solitary pulmonary nodule: Secondary | ICD-10-CM

## 2020-04-29 LAB — PULMONARY FUNCTION TEST
DL/VA % pred: 101 %
DL/VA: 4.3 ml/min/mmHg/L
DLCO cor % pred: 87 %
DLCO cor: 21.53 ml/min/mmHg
DLCO unc % pred: 88 %
DLCO unc: 21.6 ml/min/mmHg
FEF 25-75 Post: 3.98 L/sec
FEF 25-75 Pre: 2.45 L/sec
FEF2575-%Change-Post: 62 %
FEF2575-%Pred-Post: 124 %
FEF2575-%Pred-Pre: 76 %
FEV1-%Change-Post: 15 %
FEV1-%Pred-Post: 96 %
FEV1-%Pred-Pre: 83 %
FEV1-Post: 3.19 L
FEV1-Pre: 2.77 L
FEV1FVC-%Change-Post: 11 %
FEV1FVC-%Pred-Pre: 94 %
FEV6-%Change-Post: 4 %
FEV6-%Pred-Post: 91 %
FEV6-%Pred-Pre: 88 %
FEV6-Post: 3.71 L
FEV6-Pre: 3.56 L
FEV6FVC-%Change-Post: 0 %
FEV6FVC-%Pred-Post: 102 %
FEV6FVC-%Pred-Pre: 101 %
FVC-%Change-Post: 3 %
FVC-%Pred-Post: 89 %
FVC-%Pred-Pre: 87 %
FVC-Post: 3.71 L
FVC-Pre: 3.6 L
Post FEV1/FVC ratio: 86 %
Post FEV6/FVC ratio: 100 %
Pre FEV1/FVC ratio: 77 %
Pre FEV6/FVC Ratio: 99 %

## 2020-04-29 MED ORDER — BREO ELLIPTA 100-25 MCG/INH IN AEPB: 1.0000 | INHALATION_SPRAY | Freq: Every day | RESPIRATORY_TRACT | 0 refills | Status: DC

## 2020-04-29 NOTE — Assessment & Plan Note (Signed)
She has mild restriction, mild obstruction with a positive bronchodilator response.  Her dyspnea seems to be out of proportion to these.  Her CT chest did not have ILD but did show possible cardiac enlargement.  We will check an echocardiogram and she may benefit from a cardiopulmonary exercise test at some point going forward.  We will also evaluate for sleep disordered breathing. I will do a trial of Breo to see if she gets benefit.  Continue her Singulair, allergy regimen, GERD regimen.   We will arrange for an echocardiogram Please continue your Singulair, Zyxal, flonase nasal spray.  Try restarting your nasal saline rinses once a day We will arrange for a split night sleep study We will try starting Breo 1 inhalation once daily.  Please rinse and gargle after you use this medication.  Keep track of whether this helps your breathing so that we can discuss next time. Continue use your albuterol 2 puffs if needed for shortness of breath, chest tightness, wheezing. Follow with Dr Delton Coombes in 1 month so that we can discuss your test results and your response to the medication

## 2020-04-29 NOTE — Progress Notes (Signed)
PFT done today. 

## 2020-04-29 NOTE — Patient Instructions (Addendum)
We will plan to repeat your CT scan of chest in February 2023 to follow small pulmonary nodule for stability We will arrange for an echocardiogram Please continue your Singulair, Zyxal, flonase nasal spray.  Try restarting your nasal saline rinses once a day We will arrange for a split night sleep study We will try starting Breo 1 inhalation once daily.  Please rinse and gargle after you use this medication.  Keep track of whether this helps your breathing so that we can discuss next time. Continue use your albuterol 2 puffs if needed for shortness of breath, chest tightness, wheezing. Follow with Dr Delton Coombes in 1 month so that we can discuss your test results and your response to the medication.

## 2020-04-29 NOTE — Progress Notes (Signed)
Subjective:    Patient ID: Hannah Klein, female    DOB: May 30, 1975, 45 y.o.   MRN: [id number]  HPI 45 year old woman, former smoker (35 pack years) with a history of allergic rhinitis, GERD, anxiety/depression, migraines, hypertension.  She carries a history of asthma that was diagnosed in her late teens.  She has been on maintenance therapy in the past, treated with but without significant relief of symptoms - was on Advair, QVAR, Symbicort.   She reports that she is dealing with slow progressive exertional SOB for over 7 years, not really having wheeze, cough. She has exertional SOB, chest heaviness. Her wt has been labile, has ups and downs +/- 20 lbs ranges 195 - 225. She reports difficult to take a deep breath in. She has nasal congestion. Sometimes feels that she get UA irritation and difficulty moving air through throat. She snores sometimes, wakes up during the night, tired during the day.  She is on Singulair, restarted 03/17/2020, uses albuterol rarely - doesn;t seem to help these sx.  Has been on immunotherapy in the past, in [place] She is on Xyzal, Nexium twice daily   Chest x-ray 03/17/2020 reviewed by me, shows mild bronchial wall thickening without any discrete infiltrates  ROV 04/29/20 --Hannah Klein follows up today for progression of shortness of breath over several years.  She has a history of asthma and tobacco use.  She has also had some weight gain that may be a contributor.  Allergic rhinitis and GERD have contributed as well, both to asthma symptoms and also upper airway irritation.  She is been managed on Singulair, albuterol as needed, Xyzal, fluticasone nasal spray, Nexium twice daily Her chest x-ray has been reassuring.  We arrange for pulmonary function testing today and a CT chest as below.  We have not performed a polysomnogram yet  IgE 22, eosinophils normal 04/04/2020  CT chest performed 04/11/2020 reviewed by me, shows no evidence of ILD but there is some evidence for mild  air trapping.  There is a 4 mm right lower lobe pulmonary nodule of unclear significance.  Chest x-rays that are available for review from [place] from 2009, 10, 12 were all reported as clear.  Pulmonary function testing performed today, reviewed by me shows mild obstruction with a positive bronchodilator response, possible coexisting restriction due to decreased RV, normal diffusion capacity.   Review of Systems As per HPI  Past Medical History:  Diagnosis Date  . Arthritis   . Asthma   . Environmental allergies   . Generalized anxiety disorder   . GERD (gastroesophageal reflux disease)   . Hx of colonic polyps   . Hypertension   . Migraines   . MRSA (methicillin resistant Staphylococcus aureus)    Lt upper arm  approx 8 years ago     No family history on file.   Social History   Socioeconomic History  . Marital status: Single    Spouse name: Not on file  . Number of children: Not on file  . Years of education: Not on file  . Highest education level: Not on file  Occupational History  . Not on file  Tobacco Use  . Smoking status: Former Smoker    Packs/day: 2.00    Years: 18.00    Pack years: 36.00    Types: Cigarettes    Start date: 1993    Quit date: 2009    Years since quitting: 13.1  . Smokeless tobacco: Never Used  Substance and Sexual Activity  .  Alcohol use: No    Alcohol/week: 0.0 standard drinks  . Drug use: No  . Sexual activity: Not on file  Other Topics Concern  . Not on file  Social History Narrative   Single.   Moved from [place].   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   Social Determinants of Health   Financial Resource Strain: Not on file  Food Insecurity: Not on file  Transportation Needs: Not on file  Physical Activity: Not on file  Stress: Not on file  Social Connections: Not on file  Intimate Partner Violence: Not on file    Has lived in [place], [place], [place], [place], [place] Has worked in an office setting No other inhaled exposures.   Allergies   Allergen Reactions  . Advair Diskus [Fluticasone-Salmeterol] Other (See Comments)    "dries my throat out really bad'  . Lipitor [Atorvastatin Calcium] Other (See Comments)    Elevations in blood pressure?     Outpatient Medications Prior to Visit  Medication Sig Dispense Refill  . albuterol (VENTOLIN HFA) 108 (90 Base) MCG/ACT inhaler INHALE 1-2 PUFFS INTO THE LUNGS EVERY 6 (SIX) HOURS AS NEEDED FOR WHEEZING OR SHORTNESS OF BREATH. 18 each 0  . Cholecalciferol (VITAMIN D3 PO) Take 1,000 Units by mouth.    . docusate sodium (COLACE) 100 MG capsule Take 100 mg by mouth daily.    Marland Kitchen esomeprazole (NEXIUM) 40 MG capsule TAKE 1 CAPSULE BY MOUTH TWICE A DAY 60 capsule 0  . fluticasone (FLONASE) 50 MCG/ACT nasal spray Place 1 spray into both nostrils daily. 16 g 2  . hydrOXYzine (VISTARIL) 50 MG capsule TAKE 1 CAPSULE BY MOUTH twice daily AS NEEDED FOR ANXIETY 180 capsule 1  . levocetirizine (XYZAL) 5 MG tablet Take 1 tablet (5 mg total) by mouth every evening. For allergies. 90 tablet 1  . Misc Natural Products (TUMERSAID PO) Take 500 mg by mouth daily.    . montelukast (SINGULAIR) 10 MG tablet Take 1 tablet (10 mg total) by mouth at bedtime. For allergies. 90 tablet 0  . Multiple Vitamin (MULTIVITAMIN) capsule Take 1 capsule by mouth daily.    . rizatriptan (MAXALT) 10 MG tablet Take 1 tablet at migraine onset, may repeat in 2 hours if needed.  Do not exceed 2 tablets in 24 hours. 10 tablet 0  . rosuvastatin (CRESTOR) 5 MG tablet TAKE 1 TABLET BY MOUTH EVERY DAY IN THE EVENING FOR CHOLESTEROL 90 tablet 3  . vitamin B-12 (CYANOCOBALAMIN) 1000 MCG tablet Take 1,000 mcg by mouth daily.    . vitamin C (ASCORBIC ACID) 500 MG tablet Take 500 mg by mouth daily.    . vitamin E 100 UNIT capsule Take 400 Units by mouth daily.    . DULoxetine (CYMBALTA) 20 MG capsule Take 1 capsule (20 mg total) by mouth daily. For anxiety and pain. (Patient not taking: Reported on 04/04/2020) 30 capsule 1   No  facility-administered medications prior to visit.         Objective:   Physical Exam Vitals:   04/29/20 1000  BP: 128/80  Pulse: 78  Temp: 98.3 F (36.8 C)  TempSrc: Temporal  SpO2: 100%  Weight: 228 lb 9.6 oz (103.7 kg)  Height: 5\' 8"  (1.727 m)   Gen: Pleasant, overwt woman, in no distress,  normal affect  ENT: No lesions,  mouth clear,  oropharynx clear, no postnasal drip  Neck: No JVD, no stridor  Lungs: No use of accessory muscles, no crackles or wheezing on normal respiration, no  wheeze on forced expiration  Cardiovascular: RRR, heart sounds normal, no murmur or gallops, no peripheral edema  Musculoskeletal: No deformities, no cyanosis or clubbing  Neuro: alert, awake, non focal  Skin: Warm, no lesions or rash      Assessment & Plan:  Dyspnea She has mild restriction, mild obstruction with a positive bronchodilator response.  Her dyspnea seems to be out of proportion to these.  Her CT chest did not have ILD but did show possible cardiac enlargement.  We will check an echocardiogram and she may benefit from a cardiopulmonary exercise test at some point going forward.  We will also evaluate for sleep disordered breathing. I will do a trial of Breo to see if she gets benefit.  Continue her Singulair, allergy regimen, GERD regimen.   We will arrange for an echocardiogram Please continue your Singulair, Zyxal, flonase nasal spray.  Try restarting your nasal saline rinses once a day We will arrange for a split night sleep study We will try starting Breo 1 inhalation once daily.  Please rinse and gargle after you use this medication.  Keep track of whether this helps your breathing so that we can discuss next time. Continue use your albuterol 2 puffs if needed for shortness of breath, chest tightness, wheezing. Follow with Dr Delton Coombes in 1 month so that we can discuss your test results and your response to the medication  Pulmonary nodule 4 mm pulmonary nodule seen on  her CT chest, likely benign but will need to be followed given her tobacco history.  We will repeat in 1 year.  Levy Pupa, MD, PhD 04/29/2020, 10:27 AM [facility] [phone] or if no answer before 7:00PM call [phone] For any issues after 7:00PM please call eLink [phone]

## 2020-04-29 NOTE — Assessment & Plan Note (Signed)
4 mm pulmonary nodule seen on her CT chest, likely benign but will need to be followed given her tobacco history.  We will repeat in 1 year.

## 2020-05-03 ENCOUNTER — Other Ambulatory Visit (HOSPITAL_COMMUNITY): Payer: 59

## 2020-05-07 ENCOUNTER — Other Ambulatory Visit: Payer: Self-pay | Admitting: Primary Care

## 2020-05-07 DIAGNOSIS — K219 Gastro-esophageal reflux disease without esophagitis: Secondary | ICD-10-CM

## 2020-05-10 ENCOUNTER — Ambulatory Visit
Admission: EM | Admit: 2020-05-10 | Discharge: 2020-05-10 | Disposition: A | Discharge status: Home / Self Care | Payer: 59 | Attending: Emergency Medicine | Admitting: Emergency Medicine

## 2020-05-10 ENCOUNTER — Other Ambulatory Visit: Payer: Self-pay

## 2020-05-10 ENCOUNTER — Encounter: Payer: Self-pay | Admitting: Emergency Medicine

## 2020-05-10 ENCOUNTER — Ambulatory Visit (INDEPENDENT_AMBULATORY_CARE_PROVIDER_SITE_OTHER): Payer: 59

## 2020-05-10 DIAGNOSIS — M25572 Pain in left ankle and joints of left foot: Secondary | ICD-10-CM | POA: Diagnosis not present

## 2020-05-10 DIAGNOSIS — W19XXXA Unspecified fall, initial encounter: Secondary | ICD-10-CM | POA: Diagnosis not present

## 2020-05-10 DIAGNOSIS — S82832A Other fracture of upper and lower end of left fibula, initial encounter for closed fracture: Secondary | ICD-10-CM | POA: Diagnosis not present

## 2020-05-10 MED ORDER — HYDROCODONE-ACETAMINOPHEN 5-325 MG PO TABS: 1.0000 | ORAL_TABLET | Freq: Four times a day (QID) | ORAL | 0 refills | Status: DC | PRN

## 2020-05-10 MED ORDER — IBUPROFEN 800 MG PO TABS: 800.0000 mg | ORAL_TABLET | Freq: Three times a day (TID) | ORAL | 0 refills | Status: DC

## 2020-05-10 MED ORDER — TRAMADOL HCL 50 MG PO TABS: 50.0000 mg | ORAL_TABLET | Freq: Four times a day (QID) | ORAL | 0 refills | Status: DC | PRN

## 2020-05-10 NOTE — Discharge Instructions (Addendum)
Please follow-up with sports medicine orthopedics for follow-up of fracture Use anti-inflammatories for pain/swelling. You may take up to 800 mg Ibuprofen every 8 hours with food. You may supplement Ibuprofen with Tylenol [phone] mg every 8 hours.  Hydrocodone for severe pain Ice and elevate Use contact below

## 2020-05-10 NOTE — ED Provider Notes (Signed)
[facility]    CSN: [id number] Arrival date & time: 05/10/20  1634      History   Chief Complaint Chief Complaint  Patient presents with  . Ankle Injury    HPI Hannah Klein is a 45 y.o. female presenting today for left ankle injury.  Occurred approximately 2 hours ago and accidentally stepped in a hole causing inversion injury of left ankle.  Felt a popping sensation immediately became nauseous with severe pain and slight lightheadedness.  Using crutches for weightbearing.  Denies history of prior fractures.  Denies pain into foot, knee. HPI  Past Medical History:  Diagnosis Date  . Arthritis   . Asthma   . Environmental allergies   . Generalized anxiety disorder   . GERD (gastroesophageal reflux disease)   . Hx of colonic polyps   . Hypertension   . Migraines   . MRSA (methicillin resistant Staphylococcus aureus)    Lt upper arm  approx 8 years ago    Patient Active Problem List   Diagnosis Date Noted  . Pulmonary nodule 04/29/2020  . Dyspnea 04/04/2020  . Chronic rhinitis 04/04/2020  . Snoring 04/04/2020  . Vertigo 02/02/2019  . Essential hypertension 11/11/2017  . Hyperlipidemia 11/29/2016  . Encounter for annual general medical examination with abnormal findings in adult 11/23/2015  . Constipation 11/23/2015  . Generalized anxiety disorder 04/26/2015  . Right-sided low back pain with right-sided sciatica 04/26/2015  . Asthma 04/26/2015  . Frequent headaches 04/26/2015  . Osteoarthritis 04/26/2015  . GERD (gastroesophageal reflux disease) 04/26/2015    Past Surgical History:  Procedure Laterality Date  . CHOLECYSTECTOMY    . DILATION AND CURETTAGE OF UTERUS    . NECK SURGERY     Fusion to C6 and C7  . WRIST ARTHROSCOPY WITH DEBRIDEMENT Left 02/27/2018   Procedure: WRIST ARTHROSCOPY WITH DEBRIDEMENT;  Surgeon: Betha Loa, MD;  Location: [facility];  Service: Orthopedics;  Laterality: Left;    OB History   No  obstetric history on file.      Home Medications    Prior to Admission medications   Medication Sig Start Date End Date Taking? Authorizing Provider  HYDROcodone-acetaminophen (NORCO/VICODIN) 5-325 MG tablet Take 1-2 tablets by mouth every 6 (six) hours as needed for severe pain. 05/10/20  Yes Ipek Westra C, PA-C  ibuprofen (ADVIL) 800 MG tablet Take 1 tablet (800 mg total) by mouth 3 (three) times daily. 05/10/20  Yes Kratos Ruscitti C, PA-C  albuterol (VENTOLIN HFA) 108 (90 Base) MCG/ACT inhaler INHALE 1-2 PUFFS INTO THE LUNGS EVERY 6 (SIX) HOURS AS NEEDED FOR WHEEZING OR SHORTNESS OF BREATH. 03/18/20   Doreene Nest, NP  Cholecalciferol (VITAMIN D3 PO) Take 1,000 Units by mouth.    [provider]  docusate sodium (COLACE) 100 MG capsule Take 100 mg by mouth daily.    [provider]  esomeprazole (NEXIUM) 40 MG capsule Take 1 capsule (40 mg total) by mouth 2 (two) times daily. For heartburn. 05/07/20   Doreene Nest, NP  fluticasone (FLONASE) 50 MCG/ACT nasal spray Place 1 spray into both nostrils daily. 04/04/20   Leslye Peer, MD  fluticasone furoate-vilanterol (BREO ELLIPTA) 100-25 MCG/INH AEPB Inhale 1 puff into the lungs daily. 04/29/20   Leslye Peer, MD  hydrOXYzine (VISTARIL) 50 MG capsule TAKE 1 CAPSULE BY MOUTH twice daily AS NEEDED FOR ANXIETY 07/30/19   Doreene Nest, NP  levocetirizine (XYZAL) 5 MG tablet Take 1 tablet (5  mg total) by mouth every evening. For allergies. 04/11/20   Doreene Nest, NP  Misc Natural Products (TUMERSAID PO) Take 500 mg by mouth daily.    [provider]  montelukast (SINGULAIR) 10 MG tablet Take 1 tablet (10 mg total) by mouth at bedtime. For allergies. 03/17/20   Doreene Nest, NP  Multiple Vitamin (MULTIVITAMIN) capsule Take 1 capsule by mouth daily.    [provider]  rizatriptan (MAXALT) 10 MG tablet Take 1 tablet at migraine onset, may repeat in 2 hours if needed.  Do not exceed 2  tablets in 24 hours. 03/18/20   Doreene Nest, NP  rosuvastatin (CRESTOR) 5 MG tablet TAKE 1 TABLET BY MOUTH EVERY DAY IN THE EVENING FOR CHOLESTEROL 04/01/20   Doreene Nest, NP  vitamin B-12 (CYANOCOBALAMIN) 1000 MCG tablet Take 1,000 mcg by mouth daily.    [provider]  vitamin C (ASCORBIC ACID) 500 MG tablet Take 500 mg by mouth daily.    [provider]  vitamin E 100 UNIT capsule Take 400 Units by mouth daily.    [provider]    Family History History reviewed. No pertinent family history.  Social History Social History   Tobacco Use  . Smoking status: Former Smoker    Packs/day: 2.00    Years: 18.00    Pack years: 36.00    Types: Cigarettes    Start date: 1993    Quit date: 2009    Years since quitting: 13.2  . Smokeless tobacco: Never Used  Substance Use Topics  . Alcohol use: No    Alcohol/week: 0.0 standard drinks  . Drug use: No     Allergies   Advair diskus [fluticasone-salmeterol] and Lipitor [atorvastatin calcium]   Review of Systems Review of Systems  Constitutional: Negative for fatigue and fever.  HENT: Negative for mouth sores.   Eyes: Negative for visual disturbance.  Respiratory: Negative for shortness of breath.   Cardiovascular: Negative for chest pain.  Gastrointestinal: Negative for abdominal pain, nausea and vomiting.  Musculoskeletal: Positive for arthralgias, gait problem and joint swelling.  Skin: Negative for color change, rash and wound.  Neurological: Negative for dizziness, weakness, light-headedness and headaches.     Physical Exam Triage Vital Signs ED Triage Vitals  Enc Vitals Group     BP 05/10/20 1646 135/83     Pulse Rate 05/10/20 1646 85     Resp 05/10/20 1646 16     Temp 05/10/20 1646 97.8 F (36.6 C)     Temp Source 05/10/20 1646 Oral     SpO2 05/10/20 1646 95 %     Weight --      Height --      Head Circumference --      Peak Flow --      Pain Score 05/10/20 1645 10      Pain Loc --      Pain Edu? --      Excl. in GC? --    No data found.  Updated Vital Signs BP 135/83 (BP Location: Right Arm)   Pulse 85   Temp 97.8 F (36.6 C) (Oral)   Resp 16   LMP 05/03/2020   SpO2 95%   Visual Acuity Right Eye Distance:   Left Eye Distance:   Bilateral Distance:    Right Eye Near:   Left Eye Near:    Bilateral Near:     Physical Exam Vitals and nursing note reviewed.  Constitutional:  Appearance: She is well-developed.     Comments: No acute distress  HENT:     Head: Normocephalic and atraumatic.     Nose: Nose normal.  Eyes:     Conjunctiva/sclera: Conjunctivae normal.  Cardiovascular:     Rate and Rhythm: Normal rate.  Pulmonary:     Effort: Pulmonary effort is normal. No respiratory distress.  Abdominal:     General: There is no distension.  Musculoskeletal:        General: Normal range of motion.     Cervical back: Neck supple.     Comments: Left ankle: Moderate swelling noted about lateral malleolus extending slightly anteriorly, tender to palpation over lateral malleolus, nontender over medial malleolus or throughout dorsum of foot, dorsalis pedis 2+, Achilles appears firm intact without tenderness  Skin:    General: Skin is warm and dry.  Neurological:     Mental Status: She is alert and oriented to person, place, and time.      UC Treatments / Results  Labs (all labs ordered are listed, but only abnormal results are displayed) Labs Reviewed - No data to display  EKG   Radiology DG Ankle Complete Left  Result Date: 05/10/2020 CLINICAL DATA:  Fall.  Ankle injury.  Pain. EXAM: LEFT ANKLE COMPLETE - 3+ VIEW COMPARISON:  None. FINDINGS: Three views study shows a nondisplaced transverse fracture through the tip of the distal fibula, below the ankle mortise. No evidence for distal tibia fracture. Ankle mortise is preserved. IMPRESSION: Nondisplaced transverse fracture through the tip of the distal most fibula. Electronically  Signed   By: Kennith Center M.D.   On: 05/10/2020 17:15    Procedures Procedures (including critical care time)  Medications Ordered in UC Medications - No data to display  Initial Impression / Assessment and Plan / UC Course  I have reviewed the triage vital signs and the nursing notes.  Pertinent labs & imaging results that were available during my care of the patient were reviewed by me and considered in my medical decision making (see chart for details).     Nondisplaced distal fibular fracture, placing in cam boot and crutches, nonweightbearing and follow-up with orthopedics, ice and elevate, anti-inflammatories.  10 tablets of hydrocodone prescribed, registry checked.  Discussed strict return precautions. Patient verbalized understanding and is agreeable with plan.  Final Clinical Impressions(s) / UC Diagnoses   Final diagnoses:  Other closed fracture of distal end of left fibula, initial encounter     Discharge Instructions     Please follow-up with sports medicine orthopedics for follow-up of fracture Use anti-inflammatories for pain/swelling. You may take up to 800 mg Ibuprofen every 8 hours with food. You may supplement Ibuprofen with Tylenol [phone] mg every 8 hours.  Hydrocodone for severe pain Ice and elevate Use contact below    ED Prescriptions    Medication Sig Dispense Auth. Provider   ibuprofen (ADVIL) 800 MG tablet Take 1 tablet (800 mg total) by mouth 3 (three) times daily. 21 tablet Avrianna Smart C, PA-C   HYDROcodone-acetaminophen (NORCO/VICODIN) 5-325 MG tablet Take 1-2 tablets by mouth every 6 (six) hours as needed for severe pain. 10 tablet Arie Gable, [place] C, PA-C     I have reviewed the PDMP during this encounter.   Lew Dawes, [place] 05/10/20 1735

## 2020-05-10 NOTE — ED Triage Notes (Signed)
Patient c/o LFT ankle injury that happened today.   Patient states " I was carrying some soil when I stepped in a hole, then I heard a loud pop".  Patient endorses extreme pain when attempting to bear weight on ankle.    Patient has used ice with no relief of symptoms.

## 2020-05-20 DIAGNOSIS — R0683 Snoring: Secondary | ICD-10-CM

## 2020-05-20 NOTE — Telephone Encounter (Signed)
Dr. Delton Coombes, please see mychart message sent by pt and advise:  To: [facility]    From: Hannah Klein "[name]"    Created: 05/20/2020 7:58 AM     *-*-*This message was handled on 05/20/2020 9:20 AM by Hayes Rehfeldt P*-*-*  I just wanted to check in about the Breo that I am on.  I feel like it may be helping some.  My issue is my sleep study was scheduled for April 22nd and I only have 7 days left on the samples you gave me.  Should we do more samples or see what it will cost me at the pharmacy?  Thank you Hannah Klein

## 2020-05-23 MED ORDER — BREO ELLIPTA 100-25 MCG/INH IN AEPB: 1.0000 | INHALATION_SPRAY | Freq: Every day | RESPIRATORY_TRACT | 2 refills | Status: DC

## 2020-05-23 NOTE — Telephone Encounter (Signed)
Breo prescription sent to [facility] [address] Advair was shown as preferred inhaler, with Patient insurance. My chart message sent to Patient.

## 2020-05-23 NOTE — Telephone Encounter (Signed)
Let's order for her at pharmacy and see how much it will cost her.

## 2020-05-25 NOTE — Addendum Note (Signed)
Addended by: Benjie Karvonen R on: 05/25/2020 03:17 PM   Modules accepted: Orders

## 2020-05-25 NOTE — Telephone Encounter (Signed)
1 - OK to order a home sleep study if this hasn't already been done  2- I'm not sure trelegy is the best choice given that her PFT only show mild obstruction. Lavada Mesi, Symbicort, Advair, Wixella would all be better choices clinically. If none of these is affordable / well-covered, then we could try Trelegy as an alternative. I am concerned that trelegy is more than she needs.

## 2020-05-25 NOTE — Telephone Encounter (Signed)
Please advise on patient mychart message  I just wanted to let you know that I got a letter from my insurance denying the sleep study.  What happens now?  Do I need to do anything?  Looks like they are questioning why not an at home sleep study.  Thank you  Hannah Klein

## 2020-05-26 ENCOUNTER — Telehealth: Payer: Self-pay | Admitting: Emergency Medicine

## 2020-05-26 ENCOUNTER — Other Ambulatory Visit: Payer: Self-pay

## 2020-05-26 MED ORDER — BREO ELLIPTA 100-25 MCG/INH IN AEPB: 1.0000 | INHALATION_SPRAY | Freq: Every day | RESPIRATORY_TRACT | 0 refills | Status: DC

## 2020-05-26 NOTE — Telephone Encounter (Signed)
Called and spoke with pt letting her know that I did find the paperwork that she dropped off for pt assistance for Symbicort. Stated to her that RB will be back in the office 4/4 and that we would get him to sign forms then. Pt verbalized understanding.  Routing to [place] for her to follow up.

## 2020-05-26 NOTE — Telephone Encounter (Signed)
She is completing financial assist paperwork for symbicort. I think symbicort would be a good choice.

## 2020-05-26 NOTE — Telephone Encounter (Signed)
Called and spoke with patient to let her know that I was going to put Breo samples up front for her to pick up with her patient assistance paperwork. Advised her she can fill out the paperwork while she is here and we will get it to Dr. Lamonte Sakai. Met patient in lobby and gave her samples and paperwork. She is doing patient assistance paperwork for Symbicort. Her Copay for all inhalers and $150. I will let Estill Bamberg Dr. Sudie Bailey nurse know to look out for paperwork. Nothing further needed at this time.   Estill Bamberg and Dr. Lamonte Sakai just [facility]

## 2020-05-26 NOTE — Telephone Encounter (Signed)
Paperwork located in Dr. Kavin Leech box. Dr. Delton Coombes pt is currently on Breo would you like to switch to Symbicort (see prior My Chart message)? Please advise.

## 2020-05-26 NOTE — Telephone Encounter (Addendum)
I did get a box that popped up that said Advair Diskus 100,250,500 are covered. She did do patient assistance paperwork this morning for Symbicort.  Please advise

## 2020-05-26 NOTE — Addendum Note (Signed)
Addended by: Benjie Karvonen R on: 05/26/2020 09:48 AM   Modules accepted: Orders

## 2020-05-30 ENCOUNTER — Other Ambulatory Visit: Payer: Self-pay

## 2020-05-30 MED ORDER — BUDESONIDE-FORMOTEROL FUMARATE 160-4.5 MCG/ACT IN AERO: 2.0000 | INHALATION_SPRAY | Freq: Two times a day (BID) | RESPIRATORY_TRACT | 6 refills | Status: DC

## 2020-05-30 NOTE — Telephone Encounter (Signed)
I agree with the change to symbicort

## 2020-05-31 ENCOUNTER — Other Ambulatory Visit: Payer: Self-pay

## 2020-05-31 ENCOUNTER — Ambulatory Visit (HOSPITAL_COMMUNITY): Payer: 59 | Attending: Cardiology

## 2020-05-31 DIAGNOSIS — R0602 Shortness of breath: Secondary | ICD-10-CM

## 2020-05-31 LAB — ECHOCARDIOGRAM COMPLETE
Area-P 1/2: 2.95 cm2
S' Lateral: 2.7 cm

## 2020-06-02 ENCOUNTER — Ambulatory Visit: Payer: 59 | Admitting: Emergency Medicine

## 2020-06-07 NOTE — Telephone Encounter (Signed)
Application was faxed on 06/01/2020. Nothing further needed at this time.

## 2020-06-07 NOTE — Telephone Encounter (Signed)
Hannah Klein, please advise if these forms were completed for the Symbicort. Thanks.

## 2020-06-08 ENCOUNTER — Other Ambulatory Visit: Payer: Self-pay | Admitting: Primary Care

## 2020-06-08 DIAGNOSIS — J4540 Moderate persistent asthma, uncomplicated: Secondary | ICD-10-CM

## 2020-06-16 ENCOUNTER — Encounter: Payer: 59 | Admitting: Gastroenterology

## 2020-06-17 ENCOUNTER — Encounter (HOSPITAL_BASED_OUTPATIENT_CLINIC_OR_DEPARTMENT_OTHER): Payer: 59 | Admitting: Pulmonary Disease

## 2020-06-20 ENCOUNTER — Telehealth: Payer: Self-pay | Admitting: Emergency Medicine

## 2020-06-20 NOTE — Telephone Encounter (Signed)
Hannah Klein  - do you know anything about this precert?

## 2020-06-22 NOTE — Telephone Encounter (Signed)
Hannah Klein - have you gotten this precert?  Pt has called back about it.

## 2020-06-24 NOTE — Telephone Encounter (Signed)
No I had to do an appeal Tobe Sos

## 2020-06-29 ENCOUNTER — Encounter: Payer: Self-pay | Admitting: Emergency Medicine

## 2020-06-29 ENCOUNTER — Ambulatory Visit: Payer: 59 | Admitting: Emergency Medicine

## 2020-06-29 ENCOUNTER — Other Ambulatory Visit: Payer: Self-pay

## 2020-06-29 VITALS — BP 142/84 | HR 71 | Temp 98.9°F | Ht 68.0 in | Wt 232.0 lb

## 2020-06-29 DIAGNOSIS — J4540 Moderate persistent asthma, uncomplicated: Secondary | ICD-10-CM | POA: Diagnosis not present

## 2020-06-29 DIAGNOSIS — R911 Solitary pulmonary nodule: Secondary | ICD-10-CM | POA: Diagnosis not present

## 2020-06-29 DIAGNOSIS — R0683 Snoring: Secondary | ICD-10-CM

## 2020-06-29 DIAGNOSIS — R0602 Shortness of breath: Secondary | ICD-10-CM | POA: Diagnosis not present

## 2020-06-29 MED ORDER — BREO ELLIPTA 100-25 MCG/INH IN AEPB: 1.0000 | INHALATION_SPRAY | Freq: Every day | RESPIRATORY_TRACT | 0 refills | Status: DC

## 2020-06-29 NOTE — Assessment & Plan Note (Signed)
Her echocardiogram was reassuring.  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  She did get some benefit from the [facility] but she also has side effects from this upper airway cough and irritation.

## 2020-06-29 NOTE — Assessment & Plan Note (Signed)
Split-night sleep study was rejected by insurance, they did not get clinical history or background to support home sleep study apparently.  I documented her clinical findings and our reason for suspicion for sleep apnea in this note.  She needs at least a home sleep study as my clinical suspicion for sleep apnea is high.  We will resubmit the request.

## 2020-06-29 NOTE — Assessment & Plan Note (Signed)
Needs her repeat CT scan of the chest in February 2023

## 2020-06-29 NOTE — Assessment & Plan Note (Signed)
As above Virgel Bouquet has been beneficial with regard to dyspnea but increased cough, upper airway irritation, mouth discomfort and dryness.  In a similar fashion she did not tolerate Advair.  We tried to start Symbicort but she was unable to get financial assistance.  We may be able to perform an appeal.  We will continue to work on this.  Use albuterol if needed.

## 2020-06-29 NOTE — Progress Notes (Signed)
Subjective:    Patient ID: Hannah Klein, female    DOB: 1975/03/18, 45 y.o.   MRN: [id number]  HPI  ROV 06/29/20 --45 year old woman who follows up today for dyspnea.  She has a history of tobacco use and asthma.  Pulmonary function testing have shown mixed mild obstruction with a positive bronchodilator response, some coexisting restriction and normal diffusion capacity.  No history of ILD.  She also has a 4 mm right lower lobe pulmonary nodule that we have plan to follow in 03/2021.  At her last visit I tried starting Virgel Bouquet but this was changed to Symbicort for insurance reasons.  We also performed an echocardiogram and plans to evaluate for sleep disordered breathing, but it does not look like her sleep study was over obtained due to precertification and the need for an appeal. Need to document her need.  She is on Singulair, Xyzal, fluticasone nasal spray She has now failed Advair and Breo due to severe cough and UA side effects. The Virgel Bouquet has helped her SOB. Minimal albuterol use. No flares since last time.   She is unsure whether she snores - has been told she does by friend. She is tired all the time, does not take naps. Sleep is non-restorative. Occasional AM HA.  No unintentional naps, has not fallen asleep with work or driving. Frequent nocturnal awakenings. Large neck size. BMI 35   Echocardiogram 05/31/2020 reviewed by me, shows LVEF 60-65%, normal RV size and function, no evidence of PAH.  Normal diastolic function.   Review of Systems As per HPI     Objective:   Physical Exam Vitals:   06/29/20 0925  BP: (!) 142/84  Pulse: 71  Temp: 98.9 F (37.2 C)  TempSrc: Temporal  SpO2: 99%  Weight: 232 lb (105.2 kg)  Height: 5\' 8"  (1.727 m)   Gen: Pleasant, overwt woman, in no distress,  normal affect  ENT: No lesions,  mouth clear,  oropharynx clear, no postnasal drip  Neck: No JVD, no stridor  Lungs: No use of accessory muscles, no crackles or wheezing on normal  respiration, no wheeze on forced expiration  Cardiovascular: RRR, heart sounds normal, no murmur or gallops, no peripheral edema  Musculoskeletal: No deformities, no cyanosis or clubbing  Neuro: alert, awake, non focal  Skin: Warm, no lesions or rash      Assessment & Plan:  Dyspnea Her echocardiogram was reassuring.  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  She did get some benefit from the [facility] but she also has side effects from this upper airway cough and irritation.  Asthma As above Breo has been beneficial with regard to dyspnea but increased cough, upper airway irritation, mouth discomfort and dryness.  In a similar fashion she did not tolerate Advair.  We tried to start Symbicort but she was unable to get financial assistance.  We may be able to perform an appeal.  We will continue to work on this.  Use albuterol if needed.  Pulmonary nodule Needs her repeat CT scan of the chest in February 2023  Snoring Split-night sleep study was rejected by insurance, they did not get clinical history or background to support home sleep study apparently.  I documented her clinical findings and our reason for suspicion for sleep apnea in this note.  She needs at least a home sleep study as my clinical suspicion for sleep apnea is high.  We will resubmit the request.  March 2023  Delton Coombes, MD, PhD 06/29/2020, 1:31 PM [facility] [phone] or if no answer before 7:00PM call [phone] For any issues after 7:00PM please call eLink [phone]

## 2020-06-29 NOTE — Telephone Encounter (Signed)
Any update on the process. Pt was seen in office today by Dr. Delton Coombes and included in OV was details on need for sleep study

## 2020-06-29 NOTE — Patient Instructions (Signed)
Your echocardiogram was normal.  This is good news For now we will continue Breo 1 inhalation once daily.  Rinse and gargle after using.  I do not think this is the best medication for you long-term because of the significant side effects of upper airway irritation and cough.  We will work on trying to get you an alternative. You need to get your home sleep study.  We will work on getting it approved by [employer] as soon as possible. We will plan to repeat your CT scan of the chest in February 2023 to follow a small pulmonary nodule Follow with Dr. Delton Coombes in 2 months or sooner if you have any problems.

## 2020-06-30 ENCOUNTER — Other Ambulatory Visit: Payer: Self-pay | Admitting: Emergency Medicine

## 2020-06-30 NOTE — Telephone Encounter (Signed)
I have spent 45 mins on the phone getting cut off 4 different times no idea get if they are working on the appeal. I have spoken to the pt and I have refaxed all info to [employer] again  Will contact pt once I get a response she is aware of this Tobe Sos

## 2020-07-01 NOTE — Telephone Encounter (Signed)
Received auth today pt is aware and she will be called soon to set up HST Hannah Klein

## 2020-07-07 ENCOUNTER — Other Ambulatory Visit (HOSPITAL_COMMUNITY)
Admission: RE | Admit: 2020-07-07 | Discharge: 2020-07-07 | Disposition: A | Discharge status: Home / Self Care | Payer: 59 | Source: Ambulatory Visit | Attending: Student | Admitting: Student

## 2020-07-07 ENCOUNTER — Encounter: Payer: Self-pay | Admitting: Student

## 2020-07-07 ENCOUNTER — Ambulatory Visit (INDEPENDENT_AMBULATORY_CARE_PROVIDER_SITE_OTHER): Payer: 59 | Admitting: Student

## 2020-07-07 ENCOUNTER — Other Ambulatory Visit: Payer: Self-pay

## 2020-07-07 VITALS — BP 130/90 | HR 64 | Wt 227.4 lb

## 2020-07-07 DIAGNOSIS — Z124 Encounter for screening for malignant neoplasm of cervix: Secondary | ICD-10-CM | POA: Diagnosis not present

## 2020-07-07 DIAGNOSIS — Z0001 Encounter for general adult medical examination with abnormal findings: Secondary | ICD-10-CM

## 2020-07-07 DIAGNOSIS — Z01419 Encounter for gynecological examination (general) (routine) without abnormal findings: Secondary | ICD-10-CM

## 2020-07-07 NOTE — Patient Instructions (Addendum)
[employer] (14th ed., pp. [phone]). [place], [place]: Elsevier.">  Perimenopause Perimenopause is the normal time of a woman's life when the levels of estrogen, the female hormone produced by the ovaries, begin to decrease. This leads to changes in menstrual periods before they stop completely (menopause). Perimenopause can begin 2-8 years before menopause. During perimenopause, the ovaries may or may not produce an egg and a woman can still become pregnant. What are the causes? This condition is caused by a natural change in hormone levels that happens as you get older. What increases the risk? This condition is more likely to start at an earlier age if you have certain medical conditions or have undergone treatments, including:  A tumor of the pituitary gland in the brain.  A disease that affects the ovaries and hormone production.  Certain cancer treatments, such as chemotherapy or hormone therapy, or radiation therapy on the pelvis.  Heavy smoking and excessive alcohol use.  Family history of early menopause. What are the signs or symptoms? Perimenopausal changes affect each woman differently. Symptoms of this condition may include:  Hot flashes.  Irregular menstrual periods.  Night sweats.  Changes in feelings about sex. This could be a decrease in sex drive or an increased discomfort around your sexuality.  Vaginal dryness.  Headaches.  Mood swings.  Depression.  Problems sleeping (insomnia).  Memory problems or trouble concentrating.  Irritability.  Tiredness.  Weight gain.  Anxiety.  Trouble getting pregnant. How is this diagnosed? This condition is diagnosed based on your medical history, a physical exam, your age, your menstrual history, and your symptoms. Hormone tests may also be done. How is this treated? In some cases, no treatment is needed. You and your health care provider should make a decision together  about whether treatment is necessary. Treatment will be based on your individual condition and preferences. Various treatments are available, such as:  Menopausal hormone therapy (MHT).  Medicines to treat specific symptoms.  Acupuncture.  Vitamin or herbal supplements. Before starting treatment, make sure to let your health care provider know if you have a personal or family history of:  Heart disease.  Breast cancer.  Blood clots.  Diabetes.  Osteoporosis. Follow these instructions at home: Medicines  Take over-the-counter and prescription medicines only as told by your health care provider.  Take vitamin supplements only as told by your health care provider.  Talk with your health care provider before starting any herbal supplements. Lifestyle  Do not use any products that contain nicotine or tobacco, such as cigarettes, e-cigarettes, and chewing tobacco. If you need help quitting, ask your health care provider.  Get at least 30 minutes of physical activity on 5 or more days each week.  Eat a balanced diet that includes fresh fruits and vegetables, whole grains, soybeans, eggs, lean meat, and low-fat dairy.  Avoid alcoholic and caffeinated beverages, as well as spicy foods. This may help prevent hot flashes.  Get 7-8 hours of sleep each night.  Dress in layers that can be removed to help you manage hot flashes.  Find ways to manage stress, such as deep breathing, meditation, or journaling.   General instructions  Keep track of your menstrual periods, including: ? When they occur. ? How heavy they are and how long they last. ? How much time passes between periods.  Keep track of your symptoms, noting when they start, how often you have them, and how long they last.  Use vaginal lubricants or moisturizers to help  with vaginal dryness and improve comfort during sex.  You can still become pregnant if you are having irregular periods. Make sure you use  contraception during perimenopause if you do not want to get pregnant.  Keep all follow-up visits. This is important. This includes any group therapy or counseling.   Contact a health care provider if:  You have heavy vaginal bleeding or pass blood clots.  Your period lasts more than 2 days longer than normal.  Your periods are recurring sooner than 21 days.  You bleed after having sex.  You have pain during sex. Get help right away if you have:  Chest pain, trouble breathing, or trouble talking.  Severe depression.  Pain when you urinate.  Severe headaches.  Vision problems. Summary  Perimenopause is the time when a woman's body begins to move into menopause. This may happen naturally or as a result of other health problems or medical treatments.  Perimenopause can begin 2-8 years before menopause, and it can last for several years.  Perimenopausal symptoms can be managed through medicines, lifestyle changes, and complementary therapies such as acupuncture. This information is not intended to replace advice given to you by your health care provider. Make sure you discuss any questions you have with your health care provider. Document Revised: 07/30/2019 Document Reviewed: 07/30/2019 Elsevier Patient Education  2021 [employer]. Colonoscopy, Adult A colonoscopy is a procedure to look at the entire large intestine. This procedure is done using a long, thin, flexible tube that has a camera on the end. You may have a colonoscopy:  As a part of normal colorectal screening.  If you have certain symptoms, such as: ? A low number of red blood cells in your blood (anemia). ? Diarrhea that does not go away. ? Pain in your abdomen. ? Blood in your stool. A colonoscopy can help screen for and diagnose medical problems, including:  Tumors.  Extra tissue that grows where mucus forms (polyps).  Inflammation.  Areas of bleeding. Tell your health care provider about:  Any  allergies you have.  All medicines you are taking, including vitamins, herbs, eye drops, creams, and over-the-counter medicines.  Any problems you or family members have had with anesthetic medicines.  Any blood disorders you have.  Any surgeries you have had.  Any medical conditions you have.  Any problems you have had with having bowel movements.  Whether you are pregnant or may be pregnant. What are the risks? Generally, this is a safe procedure. However, problems may occur, including:  Bleeding.  Damage to your intestine.  Allergic reactions to medicines given during the procedure.  Infection. This is rare. What happens before the procedure? Eating and drinking restrictions Follow instructions from your health care provider about eating or drinking restrictions, which may include:  A few days before the procedure: ? Follow a low-fiber diet. ? Avoid nuts, seeds, dried fruit, raw fruits, and vegetables.  1-3 days before the procedure: ? Eat only gelatin dessert or ice pops. ? Drink only clear liquids, such as water, clear juice, clear broth or bouillon, black coffee or tea, or clear soft drinks or sports drinks. ? Avoid liquids that contain red or purple dye.  The day of the procedure: ? Do not eat solid foods. You may continue to drink clear liquids until up to 2 hours before the procedure. ? Do not eat or drink anything starting 2 hours before the procedure, or within the time period that your health care provider recommends. Bowel  prep If you were prescribed a bowel prep to take by mouth (orally) to clean out your colon:  Take it as told by your health care provider. Starting the day before your procedure, you will need to drink a large amount of liquid medicine. The liquid will cause you to have many bowel movements of loose stool until your stool becomes almost clear or light green.  If your skin or the opening between the buttocks (anus) gets irritated from  diarrhea, you may relieve the irritation using: ? Wipes with medicine in them, such as adult wet wipes with aloe and vitamin E. ? A product to soothe skin, such as petroleum jelly.  If you vomit while drinking the bowel prep: ? Take a break for up to 60 minutes. ? Begin the bowel prep again. ? Call your health care provider if you keep vomiting or you cannot take the bowel prep without vomiting.  To clean out your colon, you may also be given: ? Laxative medicines. These help you have a bowel movement. ? Instructions for enema use. An enema is liquid medicine injected into your rectum. Medicines Ask your health care provider about:  Changing or stopping your regular medicines or supplements. This is especially important if you are taking iron supplements, diabetes medicines, or blood thinners.  Taking medicines such as aspirin and ibuprofen. These medicines can thin your blood. Do not take these medicines unless your health care provider tells you to take them.  Taking over-the-counter medicines, vitamins, herbs, and supplements. General instructions  Ask your health care provider what steps will be taken to help prevent infection. These may include washing skin with a germ-killing soap.  Plan to have someone take you home from the hospital or clinic. What happens during the procedure?  An IV will be inserted into one of your veins.  You may be given one or more of the following: ? A medicine to help you relax (sedative). ? A medicine to numb the area (local anesthetic). ? A medicine to make you fall asleep (general anesthetic). This is rarely needed.  You will lie on your side with your knees bent.  The tube will: ? Have oil or gel put on it (be lubricated). ? Be inserted into your anus. ? Be gently eased through all parts of your large intestine.  Air will be sent into your colon to keep it open. This may cause some pressure or cramping.  Images will be taken with the  camera and will appear on a screen.  A small tissue sample may be removed to be looked at under a microscope (biopsy). The tissue may be sent to a lab for testing if any signs of problems are found.  If small polyps are found, they may be removed and checked for cancer cells.  When the procedure is finished, the tube will be removed. The procedure may vary among health care providers and hospitals.   What happens after the procedure?  Your blood pressure, heart rate, breathing rate, and blood oxygen level will be monitored until you leave the hospital or clinic.  You may have a small amount of blood in your stool.  You may pass gas and have mild cramping or bloating in your abdomen. This is caused by the air that was used to open your colon during the exam.  Do not drive for 24 hours after the procedure.  It is up to you to get the results of your procedure. Ask your health  care provider, or the department that is doing the procedure, when your results will be ready. Summary  A colonoscopy is a procedure to look at the entire large intestine.  Follow instructions from your health care provider about eating and drinking before the procedure.  If you were prescribed an oral bowel prep to clean out your colon, take it as told by your health care provider.  During the colonoscopy, a flexible tube with a camera on its end is inserted into the anus and then passed into the other parts of the large intestine. This information is not intended to replace advice given to you by your health care provider. Make sure you discuss any questions you have with your health care provider. Document Revised: 09/05/2018 Document Reviewed: 09/05/2018 Elsevier Patient Education  [place].

## 2020-07-07 NOTE — Progress Notes (Signed)
Patient ID: Hannah Klein, female   DOB: 05/02/1975, 45 y.o.   MRN: [id number]  History:  Hannah Klein is a 45 y.o. G3P0030 who presents to clinic today for pap smear. She denies any complaints. She had a longer than normal period in March. It lasted for about 3 weeks of on/off again bleeding. She denies pain, clots. She does not want any STI testing today.   The following portions of the patient's history were reviewed and updated as appropriate: allergies, current medications, family history, past medical history, social history, past surgical history and problem list.  Review of Systems:  Review of Systems  Constitutional: Negative.   HENT: Negative.   Respiratory: Negative.   Cardiovascular: Negative.   Gastrointestinal: Negative.   Genitourinary: Negative.   Skin: Negative.       Objective:  Physical Exam BP 130/90   Pulse 64   Wt 227 lb 6.4 oz (103.1 kg)   LMP 05/06/2020   BMI 34.58 kg/m  Physical Exam Constitutional:      Appearance: Normal appearance.  HENT:     Head: Normocephalic.  Cardiovascular:     Rate and Rhythm: Normal rate and regular rhythm.  Pulmonary:     Effort: Pulmonary effort is normal.  Abdominal:     General: Abdomen is flat.     Palpations: Abdomen is soft.  Genitourinary:    General: Normal vulva.     Vagina: No vaginal discharge.     Rectum: Normal.  Neurological:     Mental Status: She is alert.    Breasts are soft, symmetrical, non-tender, no lumps or masses in breasts or axiall   Labs and Imaging No results found for this or any previous visit (from the past 24 hour(s)).  No results found.   Assessment & Plan:  1. Encounter for annual general medical examination with abnormal findings in adult -patient will keep mammogram appt for July -patient will message PCP for colonoscopy; she feels like she cannot take on any other doctor's appts right now due to all of her allergy and asthma work-ups.  -keep appts with  specialist for her bp 2. Well woman exam with routine gynecological exam  - Cytology - PAP( [place])    Approximately 30 minutes of total time was spent with this patient on counseling and care  Marylene Land, CNM 07/07/2020 8:53 AM

## 2020-07-07 NOTE — Progress Notes (Signed)
New Patient is in the office for annual Pt reports LMP 05-06-20 and had irregular bleeding up until 06-02-20. Pt estimates last pap around 2016 at PCP. Mammogram scheduled for 09-02-20

## 2020-07-08 LAB — CYTOLOGY - PAP
Adequacy: ABSENT
Comment: NEGATIVE
Diagnosis: NEGATIVE
High risk HPV: NEGATIVE

## 2020-07-12 ENCOUNTER — Ambulatory Visit: Payer: 59

## 2020-07-12 ENCOUNTER — Other Ambulatory Visit: Payer: Self-pay

## 2020-07-12 DIAGNOSIS — R0683 Snoring: Secondary | ICD-10-CM

## 2020-07-12 DIAGNOSIS — G4733 Obstructive sleep apnea (adult) (pediatric): Secondary | ICD-10-CM

## 2020-07-18 ENCOUNTER — Other Ambulatory Visit: Payer: Self-pay | Admitting: Primary Care

## 2020-07-18 DIAGNOSIS — G4733 Obstructive sleep apnea (adult) (pediatric): Secondary | ICD-10-CM

## 2020-07-18 DIAGNOSIS — J4520 Mild intermittent asthma, uncomplicated: Secondary | ICD-10-CM

## 2020-07-21 NOTE — Telephone Encounter (Signed)
Received a MyChart message from patient requesting the results of her home sleep study.   Dr. Delton Coombes, can you please advise? Thanks!

## 2020-07-21 NOTE — Telephone Encounter (Signed)
Received another message from patient. She wants to re-try the Advair again and see if she has any reaction to it. I reviewed her chart and Advair was added to her allergy list due to it causing an extreme dry throat.

## 2020-07-22 DIAGNOSIS — J4520 Mild intermittent asthma, uncomplicated: Secondary | ICD-10-CM

## 2020-07-22 DIAGNOSIS — F411 Generalized anxiety disorder: Secondary | ICD-10-CM

## 2020-07-26 MED ORDER — ALBUTEROL SULFATE HFA 108 (90 BASE) MCG/ACT IN AERS: 1.0000 | INHALATION_SPRAY | Freq: Four times a day (QID) | RESPIRATORY_TRACT | 0 refills | Status: DC | PRN

## 2020-07-26 MED ORDER — HYDROXYZINE PAMOATE 50 MG PO CAPS: ORAL_CAPSULE | ORAL | 0 refills | Status: DC

## 2020-07-27 NOTE — Telephone Encounter (Signed)
I reviewed her home PSG >> shows moderate OSA with AHI 22/h. If she agrees, I would recommend starting CPAP to see if she tolerates and benefits. If she agrees please start auto-set CPAP 5-20cmH2O pressure, heated humidity, best fit mask.   Also, ok for her to go back to the Advair to see if she tolerates.

## 2020-07-28 MED ORDER — FLUTICASONE-SALMETEROL 250-50 MCG/ACT IN AEPB: 1.0000 | INHALATION_SPRAY | Freq: Two times a day (BID) | RESPIRATORY_TRACT | 11 refills | Status: DC

## 2020-07-28 NOTE — Telephone Encounter (Signed)
Dr. Delton Coombes there is not previous RX for Advair in her medication history, what strength would you like patient on? She is going out of town and needs called in today.  Sending to RB for recommendations.

## 2020-07-28 NOTE — Telephone Encounter (Signed)
Advair 250/50 , 1 inhalation bid. Rinse and gargle after

## 2020-08-08 NOTE — Telephone Encounter (Signed)
This is the 2nd time she has had trouble w Advair. Agree with stopping it. Agree with holding off on a new inhaled med for now, have her follow with me.

## 2020-08-08 NOTE — Telephone Encounter (Signed)
Called and spoke with pt who stated she has taken Advair 1 puff on Friday, Saturday and Sunday. She started having sore throat in morning, chest tightness and hoarseness on Saturday. She states she thinks it may be an allergic reaction to Advair. Pt stopped Advair yesterday. Pt's co-pay is unaffordable with any other inhalers. Pt was instructed if symptoms were to get any worse to seek emergency medical evaluation. Dr. Delton Coombes please advise on this and prior My Chart messages. Thank you.

## 2020-08-08 NOTE — Telephone Encounter (Signed)
Spoke with pt and scheduled for OV on 08/11/2020 with Dr. Delton Coombes. Nothing further needed at this time.

## 2020-08-11 ENCOUNTER — Ambulatory Visit (INDEPENDENT_AMBULATORY_CARE_PROVIDER_SITE_OTHER): Payer: 59 | Admitting: Emergency Medicine

## 2020-08-11 ENCOUNTER — Encounter: Payer: Self-pay | Admitting: Emergency Medicine

## 2020-08-11 ENCOUNTER — Other Ambulatory Visit: Payer: Self-pay

## 2020-08-11 DIAGNOSIS — R911 Solitary pulmonary nodule: Secondary | ICD-10-CM | POA: Diagnosis not present

## 2020-08-11 DIAGNOSIS — J310 Chronic rhinitis: Secondary | ICD-10-CM | POA: Diagnosis not present

## 2020-08-11 DIAGNOSIS — J4540 Moderate persistent asthma, uncomplicated: Secondary | ICD-10-CM

## 2020-08-11 DIAGNOSIS — G4733 Obstructive sleep apnea (adult) (pediatric): Secondary | ICD-10-CM

## 2020-08-11 MED ORDER — BUDESONIDE-FORMOTEROL FUMARATE 160-4.5 MCG/ACT IN AERO: 2.0000 | INHALATION_SPRAY | Freq: Two times a day (BID) | RESPIRATORY_TRACT | 6 refills | Status: DC

## 2020-08-11 NOTE — Progress Notes (Signed)
Subjective:    Patient ID: Hannah Klein, female    DOB: 12/12/1975, 45 y.o.   MRN: [id number]  HPI  ROV 06/29/20 --45 year old woman who follows up today for dyspnea.  She has a history of tobacco use and asthma.  Pulmonary function testing have shown mixed mild obstruction with a positive bronchodilator response, some coexisting restriction and normal diffusion capacity.  No history of ILD.  She also has a 4 mm right lower lobe pulmonary nodule that we have plan to follow in 03/2021.  At her last visit I tried starting Virgel Bouquet but this was changed to Symbicort for insurance reasons.  We also performed an echocardiogram and plans to evaluate for sleep disordered breathing, but it does not look like her sleep study was over obtained due to precertification and the need for an appeal. Need to document her need.  She is on Singulair, Xyzal, fluticasone nasal spray She has now failed Advair and Breo due to severe cough and UA side effects. The Virgel Bouquet has helped her SOB. Minimal albuterol use. No flares since last time.   She is unsure whether she snores - has been told she does by friend. She is tired all the time, does not take naps. Sleep is non-restorative. Occasional AM HA.  No unintentional naps, has not fallen asleep with work or driving. Frequent nocturnal awakenings. Large neck size. BMI 35   Echocardiogram 05/31/2020 reviewed by me, shows LVEF 60-65%, normal RV size and function, no evidence of PAH.  Normal diastolic function.  ROV 08/12/20 --this follow-up visit 45 year old woman with asthma, history of tobacco use.  She has mixed obstruction and restriction on pulmonary function testing.  Has been managed on Singulair, Xyzal, fluticasone nasal spray.  We have tried her on several ICS/LABA including Breo, Symbicort, Advair.  Most recently we went back to Advair but she did not tolerate, experienced sore throat, chest tightness. She is off of it now, still has the sore throat and chest discomfort.  She is having a lot of nasal congestion and starting some cough. Using albuterol 3-4x a day currently. She is waking up at night needing her albuterol. She tolerated Symbicort the best but huge co-pay.   With following a 4 mm right lower lobe pulmonary nodule, next to be done in February 2023  She had a home sleep test 07/12/2020 which I have reviewed, showed an AHI 22.1/h. CPAP ordered.    Review of Systems As per HPI     Objective:   Physical Exam Vitals:   08/11/20 0923  BP: 118/70  Pulse: 72  Temp: 98 F (36.7 C)  TempSrc: Temporal  SpO2: 97%  Weight: 229 lb 12.8 oz (104.2 kg)  Height: 5\' 8"  (1.727 m)   Gen: Pleasant, overwt woman, in no distress,  normal affect  ENT: No lesions,  mouth clear,  oropharynx clear, a lot of nasal congestion, not moving any air through her nose, strong voice  Neck: No JVD, no stridor  Lungs: No use of accessory muscles, no crackles or wheezing on normal respiration, no wheeze on forced expiration  Cardiovascular: RRR, heart sounds normal, no murmur or gallops, no peripheral edema  Musculoskeletal: No deformities, no cyanosis or clubbing  Neuro: alert, awake, non focal  Skin: Warm, no lesions or rash      Assessment & Plan:  Asthma Her preferred ICS/LABA for her insurance is Advair but she cannot tolerate it, has significant side effects.  We need to get her on non-powdered formulation.  She has benefited from Symbicort in the past and would like to restart.  I will have to go through the appeals process to see if we can get the co-pay lowered since she has side effects with the lower tier medications.  We will need to get you on a non-powdered maintenance inhaler medication.  For now we will try to start Symbicort 160/4.5, 2 puffs twice a day.  Suspect we will need to go through the appeals process to try and get your co-pay decreased.  This may be possible since you are having significant side effects with Advair. Keep albuterol  available to use 2 puffs up to every 4 hours if needed for shortness of breath, chest tightness, wheezing.  Follow with Dr Delton Coombes in 1 month or next available.   Chronic rhinitis A lot of allergy symptoms, flaring upper airway symptoms, exacerbated by trying to go back on Advair which she has not been able to tolerate.  I do not hear any overt wheezing or evidence of a lower airways exacerbation.  Please take prednisone as directed until completely gone. Continue your Singulair, Xyzal, fluticasone nasal spray as you have been taking them. You may want to try doing your nasal saline rinses every day during the bad allergy season  OSA (obstructive sleep apnea) OSA documented on her home sleep test with an AHI 22.1/h.  She is interested in CPAP and we are ordering.  The devices are on backorder, usually taking about 2 months to obtain.  Pulmonary nodule Following a 4 mm right lower lobe pulmonary nodule.  Due for repeat CT chest 03/2021.  Visit time spent 32 minutes  Levy Pupa, MD, PhD 08/11/2020, 10:02 AM [facility] [phone] or if no answer before 7:00PM call [phone] For any issues after 7:00PM please call eLink [phone]

## 2020-08-11 NOTE — Telephone Encounter (Signed)
Patient was seen today and was advised that prednisone would be sent in for her. It was not sent in.   RB, can you please advise about the prednisone. Thanks.

## 2020-08-11 NOTE — Telephone Encounter (Signed)
Yes please give prednisone 20mg  qd x 5 days.

## 2020-08-11 NOTE — Assessment & Plan Note (Signed)
A lot of allergy symptoms, flaring upper airway symptoms, exacerbated by trying to go back on Advair which she has not been able to tolerate.  I do not hear any overt wheezing or evidence of a lower airways exacerbation.  Please take prednisone as directed until completely gone. Continue your Singulair, Xyzal, fluticasone nasal spray as you have been taking them. You may want to try doing your nasal saline rinses every day during the bad allergy season

## 2020-08-11 NOTE — Assessment & Plan Note (Signed)
OSA documented on her home sleep test with an AHI 22.1/h.  She is interested in CPAP and we are ordering.  The devices are on backorder, usually taking about 2 months to obtain.

## 2020-08-11 NOTE — Assessment & Plan Note (Addendum)
Her preferred ICS/LABA for her insurance is Advair but she cannot tolerate it, has significant side effects.  We need to get her on non-powdered formulation.  She has benefited from Symbicort in the past and would like to restart.  I will have to go through the appeals process to see if we can get the co-pay lowered since she has side effects with the lower tier medications.  We will need to get you on a non-powdered maintenance inhaler medication.  For now we will try to start Symbicort 160/4.5, 2 puffs twice a day.  Suspect we will need to go through the appeals process to try and get your co-pay decreased.  This may be possible since you are having significant side effects with Advair. Keep albuterol available to use 2 puffs up to every 4 hours if needed for shortness of breath, chest tightness, wheezing.  Follow with Dr Delton Coombes in 1 month or next available.

## 2020-08-11 NOTE — Assessment & Plan Note (Signed)
Following a 4 mm right lower lobe pulmonary nodule.  Due for repeat CT chest 03/2021.

## 2020-08-11 NOTE — Patient Instructions (Addendum)
Please take prednisone as directed until completely gone. Continue your Singulair, Xyzal, fluticasone nasal spray as you have been taking them. You may want to try doing your nasal saline rinses every day during the bad allergy season We will need to get you on a non-powdered maintenance inhaler medication.  For now we will try to start Symbicort 160/4.5, 2 puffs twice a day.  Suspect we will need to go through the appeals process to try and get your co-pay decreased.  This may be possible since you are having significant side effects with Advair. Keep albuterol available to use 2 puffs up to every 4 hours if needed for shortness of breath, chest tightness, wheezing.  Your CPAP has been ordered.  We will get it started when the company is able to provide the device. Plan to repeat your CT scan of the chest to follow your pulmonary nodule in February 2023. Follow with Dr Delton Coombes in 1 month or next available.

## 2020-08-12 MED ORDER — PREDNISONE 20 MG PO TABS: 20.0000 mg | ORAL_TABLET | Freq: Every day | ORAL | 0 refills | Status: DC

## 2020-08-19 ENCOUNTER — Other Ambulatory Visit: Payer: Self-pay | Admitting: Primary Care

## 2020-08-19 DIAGNOSIS — J4520 Mild intermittent asthma, uncomplicated: Secondary | ICD-10-CM

## 2020-08-26 ENCOUNTER — Ambulatory Visit (INDEPENDENT_AMBULATORY_CARE_PROVIDER_SITE_OTHER)
Admission: RE | Admit: 2020-08-26 | Discharge: 2020-08-26 | Disposition: A | Discharge status: Home / Self Care | Payer: 59 | Source: Ambulatory Visit | Attending: Primary Care | Admitting: Primary Care

## 2020-08-26 ENCOUNTER — Other Ambulatory Visit: Payer: Self-pay

## 2020-08-26 ENCOUNTER — Ambulatory Visit (INDEPENDENT_AMBULATORY_CARE_PROVIDER_SITE_OTHER): Payer: 59 | Admitting: Primary Care

## 2020-08-26 DIAGNOSIS — R911 Solitary pulmonary nodule: Secondary | ICD-10-CM | POA: Diagnosis not present

## 2020-08-26 DIAGNOSIS — R053 Chronic cough: Secondary | ICD-10-CM

## 2020-08-26 DIAGNOSIS — J4540 Moderate persistent asthma, uncomplicated: Secondary | ICD-10-CM

## 2020-08-26 DIAGNOSIS — G4733 Obstructive sleep apnea (adult) (pediatric): Secondary | ICD-10-CM

## 2020-08-26 DIAGNOSIS — J4520 Mild intermittent asthma, uncomplicated: Secondary | ICD-10-CM

## 2020-08-26 DIAGNOSIS — R059 Cough, unspecified: Secondary | ICD-10-CM | POA: Insufficient documentation

## 2020-08-26 MED ORDER — ALBUTEROL SULFATE HFA 108 (90 BASE) MCG/ACT IN AERS: 1.0000 | INHALATION_SPRAY | Freq: Four times a day (QID) | RESPIRATORY_TRACT | 0 refills | Status: DC | PRN

## 2020-08-26 MED ORDER — BENZONATATE 200 MG PO CAPS: 200.0000 mg | ORAL_CAPSULE | Freq: Three times a day (TID) | ORAL | 0 refills | Status: DC | PRN

## 2020-08-26 NOTE — Assessment & Plan Note (Signed)
Reviewed. Due for repeat imaging February 2023,

## 2020-08-26 NOTE — Assessment & Plan Note (Signed)
Pending CPAP machine treatment.  Following with pulmonology

## 2020-08-26 NOTE — Progress Notes (Signed)
Subjective:    Patient ID: Hannah Klein, female    DOB: May 15, 1975, 45 y.o.   MRN: [id number]  HPI  Hannah Klein is a very pleasant 45 y.o. female with a history of asthma, hypertension, GERD, GAD who presents today to discuss cough.  Following with pulmonology for chronic and uncontrolled asthma, last visit was 08/11/20. Known pulmonary nodule to right lower lobe, due for repeat CT in 03/2021. Positive for OSA, in process of ordering CPAP machine. Unable to tolerate Advair due to powder, had an allergic reaction. Recently treated with prednisone. Singulair, Xyzal, Flonase were continued. She was re-initiated on Symbicort, but insurance co-pays are high, pulmonologist is working to get this covered.   Today she endorses an intermittent cough that began 3 weeks ago after trying the Advair inhaler, had an "allergic reaction". Since then she's noticed the cough, occurs with rest, at night, and when outdoors in the heat. She will have a coughing fit in the mornings, expels green sputum. Now with muscle thoracic pain to back with cough.   She did purchase a Symbicort inhaler, has used for 2 weeks. She is compliant to her Nexium 40 mg daily. She is using her albuterol inhaler 2-4 times daily, has been doing so for the last 3-4 weeks. She feels as though she has congestion in her chest for which she cannot cough up.    Review of Systems  Constitutional:  Positive for fatigue. Negative for fever.  HENT:  Negative for congestion.   Respiratory:  Positive for cough and shortness of breath. Negative for wheezing.   Gastrointestinal:        Denies reflux        Past Medical History:  Diagnosis Date   Arthritis    Asthma    Environmental allergies    Generalized anxiety disorder    GERD (gastroesophageal reflux disease)    Hx of colonic polyps    Hypertension    Migraines    MRSA (methicillin resistant Staphylococcus aureus)    Lt upper arm  approx 8 years ago    Social  History   Socioeconomic History   Marital status: Single    Spouse name: Not on file   Number of children: Not on file   Years of education: Not on file   Highest education level: Not on file  Occupational History   Not on file  Tobacco Use   Smoking status: Former    Packs/day: 2.00    Years: 18.00    Pack years: 36.00    Types: Cigarettes    Start date: 10    Quit date: 2009    Years since quitting: 13.5   Smokeless tobacco: Never  Substance and Sexual Activity   Alcohol use: No    Alcohol/week: 0.0 standard drinks   Drug use: No   Sexual activity: Not Currently  Other Topics Concern   Not on file  Social History Narrative   Single.   Moved from [place].   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   Social Determinants of Health   Financial Resource Strain: Not on file  Food Insecurity: Not on file  Transportation Needs: Not on file  Physical Activity: Not on file  Stress: Not on file  Social Connections: Not on file  Intimate Partner Violence: Not on file    Past Surgical History:  Procedure Laterality Date   CHOLECYSTECTOMY     DILATION AND CURETTAGE OF UTERUS     NECK SURGERY  Fusion to C6 and C7   WRIST ARTHROSCOPY WITH DEBRIDEMENT Left 02/27/2018   Procedure: WRIST ARTHROSCOPY WITH DEBRIDEMENT;  Surgeon: Betha Loa, MD;  Location: [facility];  Service: Orthopedics;  Laterality: Left;    No family history on file.  Allergies  Allergen Reactions   Advair Diskus [Fluticasone-Salmeterol] Other (See Comments)    "dries my throat out really bad'   Lipitor [Atorvastatin Calcium] Other (See Comments)    Elevations in blood pressure?    Current Outpatient Medications on File Prior to Visit  Medication Sig Dispense Refill   albuterol (VENTOLIN HFA) 108 (90 Base) MCG/ACT inhaler Inhale 1-2 puffs into the lungs every 6 (six) hours as needed for wheezing or shortness of breath. 18 g 0   budesonide-formoterol (SYMBICORT) 160-4.5 MCG/ACT inhaler Inhale  2 puffs into the lungs 2 (two) times daily. 1 each 6   Cholecalciferol (VITAMIN D3 PO) Take 1,000 Units by mouth.     docusate sodium (COLACE) 100 MG capsule Take 100 mg by mouth daily.     esomeprazole (NEXIUM) 40 MG capsule Take 1 capsule (40 mg total) by mouth 2 (two) times daily. For heartburn. 180 capsule 3   fluticasone (FLONASE) 50 MCG/ACT nasal spray SPRAY 1 SPRAY INTO BOTH NOSTRILS DAILY. 48 mL 3   hydrOXYzine (VISTARIL) 50 MG capsule TAKE 1 CAPSULE BY MOUTH once or twice daily AS NEEDED FOR ANXIETY 180 capsule 0   ibuprofen (ADVIL) 800 MG tablet Take 1 tablet (800 mg total) by mouth 3 (three) times daily. 21 tablet 0   levocetirizine (XYZAL) 5 MG tablet Take 1 tablet (5 mg total) by mouth every evening. For allergies. 90 tablet 1   Misc Natural Products (TUMERSAID PO) Take 500 mg by mouth daily.     montelukast (SINGULAIR) 10 MG tablet TAKE 1 TABLET (10 MG TOTAL) BY MOUTH AT BEDTIME. FOR ALLERGIES. 90 tablet 2   Multiple Vitamin (MULTIVITAMIN) capsule Take 1 capsule by mouth daily.     predniSONE (DELTASONE) 20 MG tablet Take 1 tablet (20 mg total) by mouth daily with breakfast. 5 tablet 0   rizatriptan (MAXALT) 10 MG tablet Take 1 tablet at migraine onset, may repeat in 2 hours if needed.  Do not exceed 2 tablets in 24 hours. 10 tablet 0   rosuvastatin (CRESTOR) 5 MG tablet TAKE 1 TABLET BY MOUTH EVERY DAY IN THE EVENING FOR CHOLESTEROL 90 tablet 3   traMADol (ULTRAM) 50 MG tablet Take 1 tablet (50 mg total) by mouth every 6 (six) hours as needed. 15 tablet 0   vitamin B-12 (CYANOCOBALAMIN) 1000 MCG tablet Take 1,000 mcg by mouth daily.     vitamin C (ASCORBIC ACID) 500 MG tablet Take 500 mg by mouth daily.     vitamin E 100 UNIT capsule Take 400 Units by mouth daily.     No current facility-administered medications on file prior to visit.    BP 128/74   Pulse 78   Temp 98.1 F (36.7 C)   Resp 16   Ht 5\' 8"  (1.727 m)   Wt 233 lb (105.7 kg)   SpO2 99%   BMI 35.43 kg/m   Objective:   Physical Exam HENT:     Nose:     Right Sinus: No maxillary sinus tenderness or frontal sinus tenderness.     Left Sinus: No maxillary sinus tenderness or frontal sinus tenderness.  Cardiovascular:     Rate and Rhythm: Normal rate and regular rhythm.  Pulmonary:  Effort: Pulmonary effort is normal.     Breath sounds: Normal breath sounds. No wheezing or rales.  Musculoskeletal:     Cervical back: Neck supple.  Lymphadenopathy:     Cervical: No cervical adenopathy.  Skin:    General: Skin is warm and dry.          Assessment & Plan:      This visit occurred during the SARS-CoV-2 public health emergency.  Safety protocols were in place, including screening questions prior to the visit, additional usage of staff PPE, and extensive cleaning of exam room while observing appropriate contact time as indicated for disinfecting solutions.

## 2020-08-26 NOTE — Assessment & Plan Note (Signed)
Seems more allergy related, she doesn't appear acutely ill. No wheezing or abnormal sounds on lung exam.   Continue Symbicort BID, albuterol PRN, Nexium 40 mg, Singulair and Xyzal.  Checking chest xray. Rx for [employer] provided. Start Mucinex.

## 2020-08-26 NOTE — Assessment & Plan Note (Signed)
Uncontrolled, allergic reaction to Advair recently, tolerating Symbicort decently now.   Agree to refill albuterol, continue PRN use now. Continue Nexium 40 mg.  Checking chest xray today.  Start Mucinex.

## 2020-08-26 NOTE — Patient Instructions (Signed)
Complete xray(s) prior to leaving today. I will notify you of your results once received.  You may take Benzonatate capsules for cough. Take 1 capsule by mouth three times daily as needed for cough.  Shortness of Breath/Wheezing/Cough: Use the albuterol inhaler. Inhale 2 puffs into the lungs every 4 to 6 hours as needed for wheezing, cough, and/or shortness of breath.   Start Mucinex.  It was a pleasure to see you today!

## 2020-09-02 ENCOUNTER — Ambulatory Visit
Admission: RE | Admit: 2020-09-02 | Discharge: 2020-09-02 | Disposition: A | Discharge status: Home / Self Care | Payer: 59 | Source: Ambulatory Visit | Attending: Primary Care | Admitting: Primary Care

## 2020-09-02 ENCOUNTER — Other Ambulatory Visit: Payer: Self-pay

## 2020-09-02 DIAGNOSIS — Z1231 Encounter for screening mammogram for malignant neoplasm of breast: Secondary | ICD-10-CM

## 2020-09-07 ENCOUNTER — Other Ambulatory Visit: Payer: Self-pay | Admitting: Primary Care

## 2020-09-07 DIAGNOSIS — R928 Other abnormal and inconclusive findings on diagnostic imaging of breast: Secondary | ICD-10-CM

## 2020-09-07 NOTE — Telephone Encounter (Signed)
Looks like mammo she had recently requested further imaging. The facility that she went to should contact her about that shouldn't they?

## 2020-09-08 ENCOUNTER — Telehealth: Payer: Self-pay

## 2020-09-08 NOTE — Telephone Encounter (Signed)
Hannah Klein Key: [id number] - PA Case ID: [id number] help? Call us at [phone] Status Sent to [facility] Esomeprazole Magnesium 40MG  dr capsules Form [employer] ePA Form 2017 NCPDP    My chart sent back to patient to let know we have started auth.

## 2020-09-09 NOTE — Telephone Encounter (Signed)
Auth received 09/08/2020 to 08/28/2021 Reference number [id number] My chart sent to patient to let know.

## 2020-09-26 ENCOUNTER — Other Ambulatory Visit: Payer: Self-pay

## 2020-09-26 ENCOUNTER — Ambulatory Visit: Payer: 59

## 2020-09-26 ENCOUNTER — Ambulatory Visit
Admission: RE | Admit: 2020-09-26 | Discharge: 2020-09-26 | Disposition: A | Discharge status: Home / Self Care | Payer: 59 | Source: Ambulatory Visit | Attending: Primary Care | Admitting: Primary Care

## 2020-09-26 DIAGNOSIS — R928 Other abnormal and inconclusive findings on diagnostic imaging of breast: Secondary | ICD-10-CM

## 2020-09-28 ENCOUNTER — Other Ambulatory Visit: Payer: Self-pay

## 2020-09-28 ENCOUNTER — Encounter: Payer: Self-pay | Admitting: Emergency Medicine

## 2020-09-28 ENCOUNTER — Telehealth: Payer: Self-pay | Admitting: Emergency Medicine

## 2020-09-28 ENCOUNTER — Ambulatory Visit (INDEPENDENT_AMBULATORY_CARE_PROVIDER_SITE_OTHER): Payer: 59 | Admitting: Emergency Medicine

## 2020-09-28 DIAGNOSIS — J310 Chronic rhinitis: Secondary | ICD-10-CM | POA: Diagnosis not present

## 2020-09-28 DIAGNOSIS — R058 Other specified cough: Secondary | ICD-10-CM | POA: Insufficient documentation

## 2020-09-28 DIAGNOSIS — G4733 Obstructive sleep apnea (adult) (pediatric): Secondary | ICD-10-CM

## 2020-09-28 DIAGNOSIS — J4540 Moderate persistent asthma, uncomplicated: Secondary | ICD-10-CM | POA: Diagnosis not present

## 2020-09-28 NOTE — Progress Notes (Signed)
Subjective:    Patient ID: Hannah Klein, female    DOB: 1975-10-18, 45 y.o.   MRN: [id number]  HPI  ROV 08/12/20 --this follow-up visit 45 year old woman with asthma, history of tobacco use.  She has mixed obstruction and restriction on pulmonary function testing.  Has been managed on Singulair, Xyzal, fluticasone nasal spray.  We have tried her on several ICS/LABA including Breo, Symbicort, Advair.  Most recently we went back to Advair but she did not tolerate, experienced sore throat, chest tightness. She is off of it now, still has the sore throat and chest discomfort. She is having a lot of nasal congestion and starting some cough. Using albuterol 3-4x a day currently. She is waking up at night needing her albuterol. She tolerated Symbicort the best but huge co-pay.   With following a 4 mm right lower lobe pulmonary nodule, next to be done in February 2023  She had a home sleep test 07/12/2020 which I have reviewed, showed an AHI 22.1/h. CPAP ordered.   ROV 09/28/20 --45 year old woman with history of allergic rhinitis, allergic asthma.  Also history of tobacco use.  She has mixed obstruction and restriction on PFT.  New diagnosis of obstructive sleep apnea based on home sleep test from May 2022.  CPAP was ordered in June, but it is on backorder.  She is currently using Symbicort. Her UA irritation and cough are a bit better At her visit in June she was having significant flaring of her allergy symptoms with upper airway irritation >> finally better after about 2 months, unclear whether the pred helped much. Uses albuterol a few times a day. Her wt has gone up over 4 months about 30 lbs.    Review of Systems As per HPI     Objective:   Physical Exam Vitals:   09/28/20 0859  BP: (!) 154/80  Pulse: 64  SpO2: 97%  Weight: 234 lb (106.1 kg)  Height: 5\' 9"  (1.753 m)    Gen: Pleasant, overwt woman, in no distress,  normal affect  ENT: No lesions,  mouth clear,  oropharynx clear,  less nasal congestion, strong voice  Neck: No JVD, no stridor  Lungs: No use of accessory muscles, no crackles or wheezing on normal respiration, no wheeze on forced expiration  Cardiovascular: RRR, heart sounds normal, no murmur or gallops, no peripheral edema  Musculoskeletal: No deformities, no cyanosis or clubbing  Neuro: alert, awake, non focal  Skin: Warm, no lesions or rash      Assessment & Plan:  OSA (obstructive sleep apnea) She is still waiting for her CPAP machine, unfortunately on backorder.  I need to see her 30-45 days after she gets it to document compliance and clinical benefit.  She will follow-up with me at that time.  Asthma Allergic symptoms are better and asthma is a bit more stable.  Unfortunately she is having difficulty getting her Symbicort due to cost.  We can try to supplement her with samples if possible.  She does not have the option of getting financial assistance from the pharmaceutical company since she has insurance.  Unclear whether there may be a superior formulary substitution.  She cannot tolerate powdered ICS/LABA.  would be an option  Upper airway cough syndrome Exacerbated by chronic rhinitis.  She was flaring significantly, have more allergic symptoms at our last visit so I treated her briefly with prednisone.  Unclear that this benefited her significantly but her symptoms are improved as the allergy season has  passed.  Need to continue to aggressively treat allergic rhinitis, GERD  Chronic rhinitis Continue Flonase, Xyzal, Singulair.    Levy Pupa, MD, PhD 09/28/2020, 12:34 PM [facility] [phone] or if no answer before 7:00PM call [phone] For any issues after 7:00PM please call eLink [phone]

## 2020-09-28 NOTE — Telephone Encounter (Signed)
Pt was seen today 8/3 in office by Dr. Delton Coombes. Pt is currently on Symbicort 160 which is costing her around $150 which pt cannot afford.  Checking to see if there is an inhaler that would be more cost effective for pt based off of pt's insurance.  Pt cannot tolerate power inhalers.  Routing to pharmacy for help on this. Please advise.

## 2020-09-28 NOTE — Patient Instructions (Addendum)
I would like for you to be able to stay on Symbicort 2 puffs twice a day but understand the financial barriers.  We will try to supplement with samples as possible. Keep your albuterol available use 2 puffs when needed shortness of breath, chest tightness, wheezing. Continue your Singulair, Xyzal, fluticasone nasal spray as you have been taking them. Start your CPAP every night when it becomes available. Please call our office to set up a follow-up appointment about 30 to 45 days after you receive your CPAP so that we can review.

## 2020-09-28 NOTE — Assessment & Plan Note (Addendum)
She is still waiting for her CPAP machine, unfortunately on backorder.  I need to see her 30-45 days after she gets it to document compliance and clinical benefit.  She will follow-up with me at that time.

## 2020-09-28 NOTE — Assessment & Plan Note (Signed)
Allergic symptoms are better and asthma is a bit more stable.  Unfortunately she is having difficulty getting her Symbicort due to cost.  We can try to supplement her with samples if possible.  She does not have the option of getting financial assistance from the pharmaceutical company since she has insurance.  Unclear whether there may be a superior formulary substitution.  She cannot tolerate powdered ICS/LABA.  Elwin Sleight would be an option

## 2020-09-28 NOTE — Assessment & Plan Note (Signed)
Continue Flonase, Xyzal, Singulair.

## 2020-09-28 NOTE — Assessment & Plan Note (Addendum)
Exacerbated by chronic rhinitis.  She was flaring significantly, have more allergic symptoms at our last visit so I treated her briefly with prednisone.  Unclear that this benefited her significantly but her symptoms are improved as the allergy season has passed.  Need to continue to aggressively treat allergic rhinitis, GERD

## 2020-09-29 ENCOUNTER — Other Ambulatory Visit: Payer: Self-pay | Admitting: Primary Care

## 2020-09-29 ENCOUNTER — Other Ambulatory Visit: Payer: Self-pay | Admitting: *Deleted

## 2020-09-29 DIAGNOSIS — J4540 Moderate persistent asthma, uncomplicated: Secondary | ICD-10-CM

## 2020-09-29 MED ORDER — ALBUTEROL SULFATE HFA 108 (90 BASE) MCG/ACT IN AERS: 1.0000 | INHALATION_SPRAY | Freq: Four times a day (QID) | RESPIRATORY_TRACT | 5 refills | Status: DC | PRN

## 2020-09-29 NOTE — Telephone Encounter (Signed)
RB please advise. Thanks    Can you start sending the prescription for Albuterol to [facility] on [address]?  My family dr gives me issues and questions me alot to get the refil.  You already know the situation so I thought it might just be easier if you took over prescribing that.  Is that possible?   Thank you Ludwig Clarks

## 2020-09-29 NOTE — Telephone Encounter (Signed)
Yes we can start filling her albuterol

## 2020-10-04 ENCOUNTER — Other Ambulatory Visit (HOSPITAL_COMMUNITY): Payer: Self-pay

## 2020-10-04 NOTE — Telephone Encounter (Signed)
Does this include Advair HFA (not powdered?). She has tried and failed powdered meds.

## 2020-10-04 NOTE — Telephone Encounter (Signed)
Patient's plan prefers: Advair ( brand only)- Copay is $30.00, Airduo Digihaler copay is $20 with evoucher or copay card, Symbicort- brand only, and Breo both have $150 copays.

## 2020-10-05 ENCOUNTER — Other Ambulatory Visit: Payer: Self-pay | Admitting: Primary Care

## 2020-10-05 ENCOUNTER — Other Ambulatory Visit (HOSPITAL_COMMUNITY): Payer: Self-pay

## 2020-10-05 DIAGNOSIS — J4540 Moderate persistent asthma, uncomplicated: Secondary | ICD-10-CM

## 2020-10-05 NOTE — Telephone Encounter (Signed)
Only 2 choices >> start symbicort and try to defray the cost w samples (unreliable unfortunately) Or start back on Advair discus and deal w the upper airway symptoms.   I'd opt to restart the Symbicort 160/4.5 and try to help her keep a supply

## 2020-10-05 NOTE — Telephone Encounter (Signed)
Airduo is a dry powder inhaler.  We will do the PA for the symbicort since she has failed advair and other powdered inhalers.    I have attempted to do the PA through [facility], but it stated that the symbicort did not need a PA.  I have called the insurance company and they stated that the Advair HFA and the symbicort both are coming up for $150 per month.  She does not have the benefit on her insurance to be able to qualify for the pharmacy assistance.  They are not sure if she would qualify for the patient assistance, but since she has the insurance, she would not.  RB any other suggestions?  Thanks

## 2020-10-05 NOTE — Telephone Encounter (Signed)
Sorry-Copay above was for the Diskus. Advair HFA inhaler has a $150 copay for 1 month supply.

## 2020-10-05 NOTE — Telephone Encounter (Signed)
Dorthula Nettles, CPhT  P [facility] Triage Pool; Leslye Peer, MD Caller: Unspecified (1 week ago, 10:34 AM) Patient's plan prefers: Advair disk ( brand only)- Copay is $30.00, Airduo Digihaler copay is $20 with evoucher or copay card, Symbicort- brand only, and Breo both have $150 copays.

## 2020-10-05 NOTE — Telephone Encounter (Signed)
If airduo digihaler is NOT a powder then try that option. If it is, then I guess we need to try do a PA to say that she has failed Advair and other powdered inhalers. I'm not sure that will get her Symbicoprt any cheaper however. It may be that she will be stuck on Symbicort and dependent on samples for supplementation.

## 2020-10-06 NOTE — Telephone Encounter (Signed)
I have called the pt and she is going to call to get the formulary for her insurance and see if they are able to send that over to RB.  She will try and call back today, if not it will be tomorrow.

## 2020-10-06 NOTE — Telephone Encounter (Signed)
(  Unfortunately we do not get Symbciort samples anymore). Called and spoke to pt. Informed her of the recs per Dr. Delton Coombes. Pt states she cannot take the Advair and would prefer the Symbicort.   Dr. Delton Coombes, would neb meds be an option? We do not get Symbicort samples anymore and pt is undoubtedly unable to afford the $150. Thanks!

## 2020-10-06 NOTE — Telephone Encounter (Signed)
The long acting nebs are probably just as expensive. Could check on the expense of nebulized budesonide  I would like to see the full formulary, because its possible we could use a non-powdered ICS + albuterol HFA

## 2020-10-06 NOTE — Telephone Encounter (Signed)
I have placed the formulary in RB cabinet.  Will send this to RB and AJ to make them aware.  Pt is aware that this has been printed out for RB To review and we will call her once he decides on which medication may work.

## 2020-10-10 NOTE — Telephone Encounter (Signed)
The formulary that I have does not include inhaled meds; no BD or ICS. I don't know what to order for her that would be most affordable.

## 2020-10-27 ENCOUNTER — Other Ambulatory Visit: Payer: Self-pay | Admitting: Primary Care

## 2020-10-27 DIAGNOSIS — F411 Generalized anxiety disorder: Secondary | ICD-10-CM

## 2020-11-27 ENCOUNTER — Other Ambulatory Visit: Payer: Self-pay | Admitting: Primary Care

## 2020-11-27 DIAGNOSIS — K219 Gastro-esophageal reflux disease without esophagitis: Secondary | ICD-10-CM

## 2020-12-05 ENCOUNTER — Encounter: Payer: Self-pay | Admitting: Emergency Medicine

## 2020-12-05 ENCOUNTER — Ambulatory Visit
Admission: EM | Admit: 2020-12-05 | Discharge: 2020-12-05 | Disposition: A | Discharge status: Home / Self Care | Payer: 59 | Attending: Physician Assistant | Admitting: Physician Assistant

## 2020-12-05 ENCOUNTER — Other Ambulatory Visit: Payer: Self-pay

## 2020-12-05 DIAGNOSIS — H5789 Other specified disorders of eye and adnexa: Secondary | ICD-10-CM | POA: Diagnosis not present

## 2020-12-05 MED ORDER — BACITRACIN-POLYMYXIN B 500-10000 UNIT/GM OP OINT: 1.0000 "application " | TOPICAL_OINTMENT | Freq: Two times a day (BID) | OPHTHALMIC | 0 refills | Status: DC

## 2020-12-05 NOTE — ED Provider Notes (Signed)
[facility]    CSN: [id number] Arrival date & time: 12/05/20  1717      History   Chief Complaint Chief Complaint  Patient presents with   Eye Problem    HPI Hannah Klein is a 45 y.o. female.   Patient here today for evaluation of bilateral eye irritation that started 3 days ago.  She reports that symptoms seem to be worsening with time.  She has tried over-the-counter eyedrops without significant relief.  She has not had fever or chills.  She states she has started to have some discharge.  She has not had any crusting at this point.  She denies any other symptoms including congestion.  The history is provided by the patient.  Eye Problem Associated symptoms: discharge and itching   Associated symptoms: no nausea, no redness and no vomiting    Past Medical History:  Diagnosis Date   Arthritis    Asthma    Environmental allergies    Generalized anxiety disorder    GERD (gastroesophageal reflux disease)    Hx of colonic polyps    Hypertension    Migraines    MRSA (methicillin resistant Staphylococcus aureus)    Lt upper arm  approx 8 years ago    Patient Active Problem List   Diagnosis Date Noted   Upper airway cough syndrome 09/28/2020   Persistent cough for 3 weeks or longer 08/26/2020   Pulmonary nodule 04/29/2020   Dyspnea 04/04/2020   Chronic rhinitis 04/04/2020   OSA (obstructive sleep apnea) 04/04/2020   Vertigo 02/02/2019   Essential hypertension 11/11/2017   Hyperlipidemia 11/29/2016   Encounter for annual general medical examination with abnormal findings in adult 11/23/2015   Constipation 11/23/2015   Generalized anxiety disorder 04/26/2015   Right-sided low back pain with right-sided sciatica 04/26/2015   Asthma 04/26/2015   Frequent headaches 04/26/2015   Osteoarthritis 04/26/2015   GERD (gastroesophageal reflux disease) 04/26/2015    Past Surgical History:  Procedure Laterality Date   CHOLECYSTECTOMY     DILATION AND  CURETTAGE OF UTERUS     NECK SURGERY     Fusion to C6 and C7   WRIST ARTHROSCOPY WITH DEBRIDEMENT Left 02/27/2018   Procedure: WRIST ARTHROSCOPY WITH DEBRIDEMENT;  Surgeon: Betha Loa, MD;  Location: [facility];  Service: Orthopedics;  Laterality: Left;    OB History     Gravida  3   Para      Term      Preterm      AB  3   Living  0      SAB  3   IAB      Ectopic      Multiple      Live Births               Home Medications    Prior to Admission medications   Medication Sig Start Date End Date Taking? Authorizing Provider  bacitracin-polymyxin b (POLYSPORIN) ophthalmic ointment Place 1 application into both eyes 2 (two) times daily. apply to eye every 12 hours while awake 12/05/20  Yes Tomi Bamberger, PA-C  albuterol (VENTOLIN HFA) 108 (90 Base) MCG/ACT inhaler Inhale 1-2 puffs into the lungs every 6 (six) hours as needed for wheezing or shortness of breath. 09/29/20   Leslye Peer, MD  benzonatate (TESSALON) 200 MG capsule Take 1 capsule (200 mg total) by mouth 3 (three) times daily as needed for cough. 08/26/20   Doreene Nest, NP  budesonide-formoterol (SYMBICORT) 160-4.5 MCG/ACT inhaler Inhale 2 puffs into the lungs 2 (two) times daily. 08/11/20   Leslye Peer, MD  Cholecalciferol (VITAMIN D3 PO) Take 1,000 Units by mouth.    [provider]  docusate sodium (COLACE) 100 MG capsule Take 100 mg by mouth daily.    [provider]  esomeprazole (NEXIUM) 40 MG capsule TAKE 1 CAPSULE (40 MG TOTAL) BY MOUTH 2 (TWO) TIMES DAILY. FOR HEARTBURN. 11/27/20   Doreene Nest, NP  fluticasone (FLONASE) 50 MCG/ACT nasal spray SPRAY 1 SPRAY INTO BOTH NOSTRILS DAILY. 07/01/20   Leslye Peer, MD  hydrOXYzine (VISTARIL) 50 MG capsule TAKE 1 CAPSULE BY MOUTH ONCE OR TWICE DAILY AS NEEDED FOR ANXIETY 10/28/20   Doreene Nest, NP  ibuprofen (ADVIL) 800 MG tablet Take 1 tablet (800 mg total) by mouth 3 (three) times daily. 05/10/20    Wieters, Hallie C, PA-C  levocetirizine (XYZAL) 5 MG tablet TAKE 1 TABLET BY MOUTH EVERY DAY IN THE EVENING FOR ALLERGIES 10/05/20   Doreene Nest, NP  Misc Natural Products (TUMERSAID PO) Take 500 mg by mouth daily.    [provider]  montelukast (SINGULAIR) 10 MG tablet TAKE 1 TABLET (10 MG TOTAL) BY MOUTH AT BEDTIME. FOR ALLERGIES. 06/09/20   Doreene Nest, NP  Multiple Vitamin (MULTIVITAMIN) capsule Take 1 capsule by mouth daily.    [provider]  rizatriptan (MAXALT) 10 MG tablet Take 1 tablet at migraine onset, may repeat in 2 hours if needed.  Do not exceed 2 tablets in 24 hours. 03/18/20   Doreene Nest, NP  rosuvastatin (CRESTOR) 5 MG tablet TAKE 1 TABLET BY MOUTH EVERY DAY IN THE EVENING FOR CHOLESTEROL 04/01/20   Doreene Nest, NP  traMADol (ULTRAM) 50 MG tablet Take 1 tablet (50 mg total) by mouth every 6 (six) hours as needed. 05/10/20   Wieters, Hallie C, PA-C  vitamin B-12 (CYANOCOBALAMIN) 1000 MCG tablet Take 1,000 mcg by mouth daily.    [provider]  vitamin C (ASCORBIC ACID) 500 MG tablet Take 500 mg by mouth daily.    [provider]  vitamin E 100 UNIT capsule Take 400 Units by mouth daily.    [provider]    Family History History reviewed. No pertinent family history.  Social History Social History   Tobacco Use   Smoking status: Former    Packs/day: 2.00    Years: 18.00    Pack years: 36.00    Types: Cigarettes    Start date: 1993    Quit date: 2009    Years since quitting: 13.7   Smokeless tobacco: Never  Substance Use Topics   Alcohol use: No    Alcohol/week: 0.0 standard drinks   Drug use: No     Allergies   Advair diskus [fluticasone-salmeterol] and Lipitor [atorvastatin calcium]   Review of Systems Review of Systems  Constitutional:  Negative for chills and fever.  HENT:  Negative for congestion.   Eyes:  Positive for pain (burning), discharge and itching. Negative for redness.   Respiratory:  Negative for shortness of breath.   Gastrointestinal:  Negative for nausea and vomiting.    Physical Exam Triage Vital Signs ED Triage Vitals [12/05/20 1800]  Enc Vitals Group     BP (!) 148/80     Pulse Rate 73     Resp 16     Temp 97.6 F (36.4 C)     Temp Source Oral  SpO2 98 %     Weight      Height      Head Circumference      Peak Flow      Pain Score 3     Pain Loc      Pain Edu?      Excl. in GC?    No data found.  Updated Vital Signs BP (!) 148/80 (BP Location: Left Arm)   Pulse 73   Temp 97.6 F (36.4 C) (Oral)   Resp 16   SpO2 98%      Physical Exam Vitals and nursing note reviewed.  Constitutional:      General: She is not in acute distress.    Appearance: Normal appearance. She is not ill-appearing.  HENT:     Head: Normocephalic and atraumatic.     Nose: Nose normal.  Eyes:     Conjunctiva/sclera: Conjunctivae normal.     Comments: Mild erythema noted to bilateral inner canthus   Neurological:     Mental Status: She is alert.  Psychiatric:        Mood and Affect: Mood normal.        Behavior: Behavior normal.        Thought Content: Thought content normal.     UC Treatments / Results  Labs (all labs ordered are listed, but only abnormal results are displayed) Labs Reviewed - No data to display  EKG   Radiology No results found.  Procedures Procedures (including critical care time)  Medications Ordered in UC Medications - No data to display  Initial Impression / Assessment and Plan / UC Course  I have reviewed the triage vital signs and the nursing notes.  Pertinent labs & imaging results that were available during my care of the patient were reviewed by me and considered in my medical decision making (see chart for details).  Will prescribe antibiotic ointment to cover infection, but suspect if conjunctivitis very early.  Hopefully ointment will sooth area of inner canthus that seems to be irritated.   Courage follow-up if no gradual improvement with treatment or if symptoms worsen anyway.  Final Clinical Impressions(s) / UC Diagnoses   Final diagnoses:  Eye irritation   Discharge Instructions   None    ED Prescriptions     Medication Sig Dispense Auth. Provider   bacitracin-polymyxin b (POLYSPORIN) ophthalmic ointment Place 1 application into both eyes 2 (two) times daily. apply to eye every 12 hours while awake 3.5 g Tomi Bamberger, PA-C      PDMP not reviewed this encounter.   Tomi Bamberger, PA-C 12/05/20 [phone]

## 2020-12-05 NOTE — ED Triage Notes (Signed)
Left inner canthus eye pain over the last 3 days, reports similar tightness on right side in the same area. Denies getting anything in her eyes. States mild drainage with occasional burning. Has tried licking her finger and using it to manipulate area, which only makes it burn worse.

## 2020-12-17 DIAGNOSIS — G4733 Obstructive sleep apnea (adult) (pediatric): Secondary | ICD-10-CM

## 2020-12-19 NOTE — Telephone Encounter (Signed)
Received the following message from patient:   "I have been having issues with the CPAP.  My issue is that it is causing severe dry mouth and throat when I am sleeping.  I am a mouth breather.  I have spoken with [employer] and they suggested that I adjust humidity and temperature.   I have been adjusting humidity and temperatures and I got those where they are comfortable.  She said if I was still having issue that it could be my prescribed pressure.  I keep trying to use it but after about 2 1/2 hours I get super dry and kinda feel suffocated in the mask when I wake up.  What else do I need to do?  Or do the pressures need changed?     Thank you  Hannah Klein"  [name], would you be willing to look at her download to see if any adjustments can be made? RB is listed as nightfloat this week. I can bring the download down to [facility]. Thanks!

## 2021-01-02 NOTE — Telephone Encounter (Signed)
RB please advise. Thanks.  

## 2021-01-03 NOTE — Telephone Encounter (Signed)
She is on an auto-set CPAP 5-20, so the pressure should adjust based on her airway resistance. Not sure that there is any pressure adjustment that would be beneficial. If she feels that she is smotherig, perhap[s the pressure is too low? I would be ok changing her to auto-set 10-20cm H2O to see if this feels better. Go ahead and order this. Thanks.

## 2021-01-13 ENCOUNTER — Ambulatory Visit (INDEPENDENT_AMBULATORY_CARE_PROVIDER_SITE_OTHER): Payer: 59 | Admitting: Emergency Medicine

## 2021-01-13 ENCOUNTER — Encounter: Payer: Self-pay | Admitting: Emergency Medicine

## 2021-01-13 ENCOUNTER — Other Ambulatory Visit: Payer: Self-pay

## 2021-01-13 DIAGNOSIS — J4540 Moderate persistent asthma, uncomplicated: Secondary | ICD-10-CM

## 2021-01-13 DIAGNOSIS — G4733 Obstructive sleep apnea (adult) (pediatric): Secondary | ICD-10-CM

## 2021-01-13 NOTE — Assessment & Plan Note (Signed)
She still working to optimize her CPAP compliance.  The mask fits well but she has difficulty when her mouth and throat get very dry even though she has on the humidity.  She is going to work on adjusting this.  When she has to take the mask off to drink water it then does not fit well and she has some leak, then is awake for over an hour.  Not optimal but she is trying to adjust to this.  Her compliance study shows good compliance, usage 80% of the days over the last month.  She may have some slight clinical benefit, less daytime sleepiness but not profound yet.

## 2021-01-13 NOTE — Progress Notes (Signed)
Subjective:    Patient ID: Hannah Klein, female    DOB: [birth year], 45 y.o.   MRN: [id number]  HPI  ROV 08/12/20 --this follow-up visit 45 year old woman with asthma, history of tobacco use.  She has mixed obstruction and restriction on pulmonary function testing.  Has been managed on Singulair, Xyzal, fluticasone nasal spray.  We have tried her on several ICS/LABA including Breo, Symbicort, Advair.  Most recently we went back to Advair but she did not tolerate, experienced sore throat, chest tightness. She is off of it now, still has the sore throat and chest discomfort. She is having a lot of nasal congestion and starting some cough. Using albuterol 3-4x a day currently. She is waking up at night needing her albuterol. She tolerated Symbicort the best but huge co-pay.   With following a 4 mm right lower lobe pulmonary nodule, next to be done in February 2023  She had a home sleep test 07/12/2020 which I have reviewed, showed an AHI 22.1/h. CPAP ordered.   ROV 09/28/20 --45 year old woman with history of allergic rhinitis, allergic asthma.  Also history of tobacco use.  She has mixed obstruction and restriction on PFT.  New diagnosis of obstructive sleep apnea based on home sleep test from May 2022.  CPAP was ordered in June, but it is on backorder.  She is currently using Symbicort. Her UA irritation and cough are a bit better At her visit in June she was having significant flaring of her allergy symptoms with upper airway irritation >> finally better after about 2 months, unclear whether the pred helped much. Uses albuterol a few times a day. Her wt has gone up over 4 months about 30 lbs.   ROV 01/13/21 --45 year old woman who follows up today for history of allergic asthma, allergic rhinitis.  Former tobacco smoker.  She has mixed obstruction and restriction on pulmonary function testing.  She also has newly diagnosed obstructive sleep apnea.  It took a long time for [name] to get her CPAP  because supplies were on backorder but she does now have this.  She reports that she is been wearing it well. She is dealing with dryness - she does use humidity add has turned it up. She isn't really sure whether it has made her feel better during the day yet.  Reports that she is having some chest aching, no wheeze or cough She is on Symbicort. Uses albuterol about once a day.   CPAP Compliance report today shows 80% usage, 50% for greater than 4 hours on an AutoSet between 10 and 20 cm water.   Review of Systems As per HPI     Objective:   Physical Exam Vitals:   01/13/21 0912  BP: 118/82  Pulse: 67  Temp: 98.3 F (36.8 C)  TempSrc: Oral  SpO2: 100%  Weight: 234 lb 9.6 oz (106.4 kg)  Height: 5\' 8"  (1.727 m)    Gen: Pleasant, overwt woman, in no distress,  normal affect  ENT: No lesions,  mouth clear,  oropharynx clear, less nasal congestion, strong voice  Neck: No JVD, no stridor  Lungs: No use of accessory muscles, no crackles or wheezing on normal respiration, no wheeze on forced expiration  Cardiovascular: RRR, heart sounds normal, no murmur or gallops, no peripheral edema  Musculoskeletal: No deformities, no cyanosis or clubbing  Neuro: alert, awake, non focal  Skin: Warm, no lesions or rash      Assessment & Plan:  OSA (obstructive sleep apnea) She  still working to optimize her CPAP compliance.  The mask fits well but she has difficulty when her mouth and throat get very dry even though she has on the humidity.  She is going to work on adjusting this.  When she has to take the mask off to drink water it then does not fit well and she has some leak, then is awake for over an hour.  Not optimal but she is trying to adjust to this.  Her compliance study shows good compliance, usage 80% of the days over the last month.  She may have some slight clinical benefit, less daytime sleepiness but not profound yet.  Asthma Doing well on Symbicort.  Her insurance is going  to change at the beginning of the year, may need to look for an nonpowered alternative.  Continue albuterol as needed.    Levy Pupa, MD, PhD 01/13/2021, 9:38 AM [facility] [phone] or if no answer before 7:00PM call [phone] For any issues after 7:00PM please call eLink [phone]

## 2021-01-13 NOTE — Assessment & Plan Note (Signed)
Doing well on Symbicort.  Her insurance is going to change at the beginning of the year, may need to look for an nonpowered alternative.  Continue albuterol as needed.

## 2021-01-13 NOTE — Patient Instructions (Addendum)
Keep working on optimizing his CPAP.  Agree with adjusting humidity as able to help with dry mouth and throat. Continue Symbicort 2 puffs twice a day.  When you get your new insurance will need to look at coverage for this as well as other possible alternatives such as Elwin Sleight, Wixela.  We would try to avoid any powdered formulation since it irritates her throat. Keep albuterol available to use 2 puffs when needed for shortness of breath, chest tightness, wheezing. Follow with Dr Delton Coombes in 6 months or sooner if you have any problems

## 2021-01-16 NOTE — Telephone Encounter (Signed)
We've tried multiple ICS/LABA inhalers - many not tolerated, cost also prohibitive. I'll need her new formulary when her insurance changes. Will need to go through it with her to determine best choice. Do not recall whether she has tolerated Dulera in the past. If so, and if it is less expensive, then I would be ok changing to this.

## 2021-02-01 ENCOUNTER — Other Ambulatory Visit: Payer: Self-pay | Admitting: Primary Care

## 2021-02-01 DIAGNOSIS — F411 Generalized anxiety disorder: Secondary | ICD-10-CM

## 2021-02-28 ENCOUNTER — Other Ambulatory Visit: Payer: Self-pay | Admitting: Primary Care

## 2021-02-28 ENCOUNTER — Encounter: Payer: Self-pay | Admitting: Emergency Medicine

## 2021-02-28 DIAGNOSIS — J4540 Moderate persistent asthma, uncomplicated: Secondary | ICD-10-CM

## 2021-02-28 DIAGNOSIS — E785 Hyperlipidemia, unspecified: Secondary | ICD-10-CM

## 2021-02-28 NOTE — Telephone Encounter (Signed)
Needs CPE or follow up for late January 2023.  Okay to send letter

## 2021-03-02 NOTE — Telephone Encounter (Signed)
Good afternoon Dr. Delton Coombes, I looked through the formulary that the patient sent [name] and it looks like her insurance will cover the following inhalers:  Anoro Ellipta Arnuity Ellipta Breo Ellipta Flovent Diskus Flovent HFA Fluticasone-Salmeterol  Please advise which inhaler you would like sent to the pharmacy for the patient.  Thank you.

## 2021-03-03 NOTE — Telephone Encounter (Signed)
Breo and Wixela (fluticasone/salmeterol) would both be substitutions. They are powdered, so they could cause UA irritation or cough.  I would go with the breo

## 2021-03-07 ENCOUNTER — Telehealth: Payer: Self-pay | Admitting: Primary Care

## 2021-03-07 DIAGNOSIS — E785 Hyperlipidemia, unspecified: Secondary | ICD-10-CM

## 2021-03-07 DIAGNOSIS — J4540 Moderate persistent asthma, uncomplicated: Secondary | ICD-10-CM

## 2021-03-07 MED ORDER — MONTELUKAST SODIUM 10 MG PO TABS: 10.0000 mg | ORAL_TABLET | Freq: Every day | ORAL | 0 refills | Status: DC

## 2021-03-07 MED ORDER — ROSUVASTATIN CALCIUM 5 MG PO TABS: 5.0000 mg | ORAL_TABLET | Freq: Every day | ORAL | 0 refills | Status: DC

## 2021-03-07 NOTE — Telephone Encounter (Signed)
Noted, refill sent to preferred pharmacy as indicated.

## 2021-03-07 NOTE — Telephone Encounter (Signed)
These prescriptions need to be switched to the  [facility] - [place], [place] - [address] Phone:  [phone]  Fax:  [phone]      montelukast (SINGULAIR) 10 MG tablet rosuvastatin (CRESTOR) 5 MG tablet  On Med Lists: Y  Last appt: 7.1.22 Next appt: 2.7.23

## 2021-03-08 ENCOUNTER — Telehealth: Payer: Self-pay

## 2021-03-08 MED ORDER — BUDESONIDE-FORMOTEROL FUMARATE 160-4.5 MCG/ACT IN AERO: 2.0000 | INHALATION_SPRAY | Freq: Two times a day (BID) | RESPIRATORY_TRACT | 6 refills | Status: DC

## 2021-03-08 NOTE — Telephone Encounter (Signed)
Has appointment for 2/7 is that ok?

## 2021-03-10 ENCOUNTER — Telehealth: Payer: Self-pay | Admitting: Pharmacy Technician

## 2021-03-10 ENCOUNTER — Other Ambulatory Visit (HOSPITAL_COMMUNITY): Payer: Self-pay

## 2021-03-10 NOTE — Telephone Encounter (Signed)
Patient Advocate Encounter  Received notification from [employer] that prior authorization for SYMBICORT is required.   PA submitted on 1.13.23 CASE [id number] via phone Status is pending   [facility] will continue to follow  Ricke Hey, CPhT Patient Advocate Phone: [phone] Fax:  [phone]

## 2021-03-14 ENCOUNTER — Other Ambulatory Visit (HOSPITAL_COMMUNITY): Payer: Self-pay

## 2021-03-14 NOTE — Telephone Encounter (Signed)
Patient Advocate Encounter  Received notification from [employer] that the request for prior authorization for Symbicort has been denied due to not having tried and failed all alternatives.    Alternatives are: Lavada Mesi, Generic Advair or Wixela, Generic Airduo respiclick.  Per test claim: Breo = $20, Dulera- not on formulary, generic advair = $10, generic airduo respiclick = $10  How would you like to proceed?

## 2021-03-17 ENCOUNTER — Telehealth: Payer: Self-pay | Admitting: Emergency Medicine

## 2021-03-17 NOTE — Telephone Encounter (Signed)
Called and spoke with patient and she stated that she was denied for Symbicort due to not trying other alternatives   Received notification from [place] that the request for prior authorization for Symbicort has been denied due to not having tried and failed all alternatives.       Alternatives are: Stan Head, Generic Advair or Wixela, Generic Airduo respiclick.     Per test claim: Breo = $20, Dulera- not on formulary, generic advair = $10, generic airduo respiclick = [id number]   Dr Lamonte Sakai Please advise

## 2021-03-20 NOTE — Telephone Encounter (Signed)
I cannot recall whether the patient has already tried Hannah Klein - please ask her. If we need to try any of these, then I would prefer non-powdered generic advair if there is a non-powdered generic version.  Otherwise I would try the Breo 100

## 2021-03-20 NOTE — Telephone Encounter (Signed)
Called and spoke with patient. I went over the preferred inhalers with her. She stated that she has tried [place] before, she received samples from our office. She tried [place] may years ago but is willing to try it again. She can not tolerate Advair because it causes her throat to be become extremely dry and irritated. She wants to try to stay away from the powdered inhalers as much as possible.   RB, can you please advise? Thanks!

## 2021-03-22 ENCOUNTER — Other Ambulatory Visit (HOSPITAL_COMMUNITY): Payer: Self-pay

## 2021-03-22 MED ORDER — FLUTICASONE FUROATE-VILANTEROL 100-25 MCG/ACT IN AEPB: 1.0000 | INHALATION_SPRAY | Freq: Every day | RESPIRATORY_TRACT | 6 refills | Status: DC

## 2021-03-22 NOTE — Addendum Note (Signed)
Addended by: Elby Beck R on: 03/22/2021 05:16 PM   Modules accepted: Orders

## 2021-03-22 NOTE — Telephone Encounter (Signed)
After reviewing her chart now able to determine that the patient has tried and failed the preferred options.  This chart note is to document that the patient has tried and failed Hannah Klein, Advair.  I do not believe generic Advair [place]) is an option for her because of the powdered formulation.  In essence she has failed them all.  I think we should initiate a new prior authorization now that I have this information and can clarify which medications she has tried.

## 2021-03-22 NOTE — Telephone Encounter (Addendum)
Received fax from insurance with authorization effective from 03/08/21 to 03/08/22.  Sent to scan

## 2021-03-22 NOTE — Telephone Encounter (Signed)
Yes, send breo 100

## 2021-03-22 NOTE — Telephone Encounter (Signed)
RX has been sent in for Breo 100. Called and let patient know it has been sent in. She expressed understanding. Nothing further needed at this time.

## 2021-03-22 NOTE — Telephone Encounter (Signed)
Patient Advocate Encounter  Prior Authorization for Symbicort 160-4.57mcg has been approved.    PA# [id number]  Effective dates: 03/22/21 through 03/22/22  Per Test Claim Patients co-pay is $366.65.   Spoke with Pharmacy to Process.  Patient Advocate Fax: [phone]

## 2021-03-22 NOTE — Telephone Encounter (Signed)
Called and spoke with patient to let her know of message from pharmacy about Symbicort copay of $366.65. She states that is something that she can't afford if she is going to have to pay that several time to meet deductible. Asked he if she wanted me to mail her patient assistance paperwork and she stated that she has done it before and didn't qualify. Advised her to call her insurance and see what is on her formulary to see what her options are and she states that she has tried to call them 3 times recently and they have very long wait times. She is asking to have Breo sent in for her so that she at least has something and she knows she can afford the $20. She states that she currently has Covid and just wants an inhaler. I asked her if she was ok with the powder inhaler. She states that the Virgel Bouquet is fine she just feels like it dries her throat out.  Dr. Delton Coombes are you ok with me sending in [facility] for patient?

## 2021-03-23 ENCOUNTER — Other Ambulatory Visit (HOSPITAL_COMMUNITY): Payer: Self-pay

## 2021-04-04 ENCOUNTER — Encounter: Payer: Managed Care, Other (non HMO) | Admitting: Primary Care

## 2021-04-07 ENCOUNTER — Other Ambulatory Visit: Payer: Self-pay

## 2021-04-07 ENCOUNTER — Ambulatory Visit (INDEPENDENT_AMBULATORY_CARE_PROVIDER_SITE_OTHER)
Admission: RE | Admit: 2021-04-07 | Discharge: 2021-04-07 | Disposition: A | Discharge status: Home / Self Care | Payer: Managed Care, Other (non HMO) | Source: Ambulatory Visit | Attending: Emergency Medicine | Admitting: Emergency Medicine

## 2021-04-07 ENCOUNTER — Other Ambulatory Visit: Payer: Self-pay | Admitting: Primary Care

## 2021-04-07 DIAGNOSIS — J4540 Moderate persistent asthma, uncomplicated: Secondary | ICD-10-CM

## 2021-04-07 DIAGNOSIS — R911 Solitary pulmonary nodule: Secondary | ICD-10-CM

## 2021-04-10 ENCOUNTER — Other Ambulatory Visit: Payer: Self-pay | Admitting: Primary Care

## 2021-04-10 DIAGNOSIS — J4540 Moderate persistent asthma, uncomplicated: Secondary | ICD-10-CM

## 2021-04-11 ENCOUNTER — Encounter: Payer: Self-pay | Admitting: Emergency Medicine

## 2021-04-11 NOTE — Telephone Encounter (Signed)
Pt is requesting results of Super D CT chest from 2/10.   Dr. Delton Coombes, please advise. Thanks.

## 2021-04-13 NOTE — Telephone Encounter (Signed)
I reviewed CT findings with her by phone. We will plan to repeat her CT without contrast in 1 year. She is currently on Breo and seems to be tolerating She needs an office visit with me in about 3 months

## 2021-04-14 NOTE — Telephone Encounter (Signed)
Rx for breo was sent in for pt after this encounter. Nothing further needed.

## 2021-04-27 ENCOUNTER — Encounter: Payer: Managed Care, Other (non HMO) | Admitting: Primary Care

## 2021-04-28 ENCOUNTER — Ambulatory Visit (INDEPENDENT_AMBULATORY_CARE_PROVIDER_SITE_OTHER): Payer: Managed Care, Other (non HMO) | Admitting: Family

## 2021-04-28 ENCOUNTER — Ambulatory Visit (INDEPENDENT_AMBULATORY_CARE_PROVIDER_SITE_OTHER)
Admission: RE | Admit: 2021-04-28 | Discharge: 2021-04-28 | Disposition: A | Discharge status: Home / Self Care | Payer: Managed Care, Other (non HMO) | Source: Ambulatory Visit | Attending: Family | Admitting: Family

## 2021-04-28 ENCOUNTER — Other Ambulatory Visit: Payer: Self-pay

## 2021-04-28 ENCOUNTER — Encounter: Payer: Self-pay | Admitting: Family

## 2021-04-28 VITALS — BP 126/88 | HR 70 | Ht 69.0 in | Wt 229.0 lb

## 2021-04-28 DIAGNOSIS — M25521 Pain in right elbow: Secondary | ICD-10-CM | POA: Diagnosis not present

## 2021-04-28 DIAGNOSIS — H8111 Benign paroxysmal vertigo, right ear: Secondary | ICD-10-CM | POA: Diagnosis not present

## 2021-04-28 DIAGNOSIS — M7711 Lateral epicondylitis, right elbow: Secondary | ICD-10-CM

## 2021-04-28 DIAGNOSIS — R42 Dizziness and giddiness: Secondary | ICD-10-CM

## 2021-04-28 DIAGNOSIS — J0111 Acute recurrent frontal sinusitis: Secondary | ICD-10-CM | POA: Insufficient documentation

## 2021-04-28 DIAGNOSIS — J310 Chronic rhinitis: Secondary | ICD-10-CM | POA: Diagnosis not present

## 2021-04-28 HISTORY — DX: Lateral epicondylitis, right elbow: M77.11

## 2021-04-28 MED ORDER — MECLIZINE HCL 25 MG PO TABS: 25.0000 mg | ORAL_TABLET | Freq: Two times a day (BID) | ORAL | 0 refills | Status: AC | PRN

## 2021-04-28 MED ORDER — METHYLPREDNISOLONE 4 MG PO TBPK: ORAL_TABLET | ORAL | 0 refills | Status: DC

## 2021-04-28 MED ORDER — AMOXICILLIN-POT CLAVULANATE 875-125 MG PO TABS: 1.0000 | ORAL_TABLET | Freq: Two times a day (BID) | ORAL | 0 refills | Status: DC

## 2021-04-28 NOTE — Assessment & Plan Note (Signed)
Meclizine prn 

## 2021-04-28 NOTE — Assessment & Plan Note (Signed)
Prescription given for augmentin 875/125 mg po bid for ten days. Pt to continue tylenol/ibuprofen prn sinus pain. Continue with humidifier prn and steam showers recommended as well. instructed If no symptom improvement in 48 hours please f/u ? ?

## 2021-04-28 NOTE — Assessment & Plan Note (Signed)
Recommended elbow brace to reduce pressure on the tendon ?Rest throughout the weekend apply heat and ice alternatively pending x-ray of the elbow as patient wishes to proceed with this ?Naproxen ibuprofen and/or Tylenol as needed ?

## 2021-04-28 NOTE — Assessment & Plan Note (Signed)
Meclizine sent prn  ?amb ref ent as ongoing , possible epley maneuver ?Continue with flonase xyzal and singulair daily ?

## 2021-04-28 NOTE — Progress Notes (Signed)
Established Patient Office Visit  Subjective:  Patient ID: Hannah Klein, female    DOB: May 20, 1975  Age: 46 y.o. MRN: [id number]  CC:  Chief Complaint  Patient presents with   Elbow Pain    Pt stated--rt elbow is pain to touch--2 weeks.    HPI Hannah Klein is here today with concerns.   Right elbow with tenderness to touch of right upper and lower bone surrounding elbow. Worse with pressure, touching it. Four weeks ago started to bother her, and she did some exercises with stretching which helped, but now came back about two weeks ago and seems to be getting worse. When she makes a fist she can feel pain down her inner forearm.   Has tried blue rub without much relief.   Print production planner for [employer], carries plants at times but doesn't think this is the issue.   Also 1/20 had covid, did well after the effect but has noticed vertigo sensation. Can induce it with turning head side to side or if she rises quickly. Even when closing eyes in shower makes her off balance. Feels the room spinning, has been on and off since January. Slowly getting worse. Takes xyzal flonase and singulair for allergies.   Past Medical History:  Diagnosis Date   Arthritis    Asthma    Environmental allergies    Generalized anxiety disorder    GERD (gastroesophageal reflux disease)    Hx of colonic polyps    Hypertension    Migraines    MRSA (methicillin resistant Staphylococcus aureus)    Lt upper arm  approx 8 years ago    Past Surgical History:  Procedure Laterality Date   CHOLECYSTECTOMY     DILATION AND CURETTAGE OF UTERUS     NECK SURGERY     Fusion to C6 and C7   WRIST ARTHROSCOPY WITH DEBRIDEMENT Left 02/27/2018   Procedure: WRIST ARTHROSCOPY WITH DEBRIDEMENT;  Surgeon: Betha Loa, MD;  Location: [facility];  Service: Orthopedics;  Laterality: Left;    No family history on file.  Social History   Socioeconomic History   Marital status: Single     Spouse name: Not on file   Number of children: Not on file   Years of education: Not on file   Highest education level: Not on file  Occupational History   Not on file  Tobacco Use   Smoking status: Former    Packs/day: 2.00    Years: 18.00    Pack years: 36.00    Types: Cigarettes    Start date: 38    Quit date: 2009    Years since quitting: 14.1   Smokeless tobacco: Never  Substance and Sexual Activity   Alcohol use: No    Alcohol/week: 0.0 standard drinks   Drug use: No   Sexual activity: Not Currently  Other Topics Concern   Not on file  Social History Narrative   Single.   Moved from [place].   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   Social Determinants of Health   Financial Resource Strain: Not on file  Food Insecurity: Not on file  Transportation Needs: Not on file  Physical Activity: Not on file  Stress: Not on file  Social Connections: Not on file  Intimate Partner Violence: Not on file    Outpatient Medications Prior to Visit  Medication Sig Dispense Refill   albuterol (VENTOLIN HFA) 108 (90 Base) MCG/ACT inhaler Inhale 1-2 puffs into the lungs every 6 (  six) hours as needed for wheezing or shortness of breath. 18 g 5   bacitracin-polymyxin b (POLYSPORIN) ophthalmic ointment Place 1 application into both eyes 2 (two) times daily. apply to eye every 12 hours while awake 3.5 g 0   Cholecalciferol (VITAMIN D3 PO) Take 1,000 Units by mouth.     docusate sodium (COLACE) 100 MG capsule Take 100 mg by mouth daily.     esomeprazole (NEXIUM) 40 MG capsule TAKE 1 CAPSULE (40 MG TOTAL) BY MOUTH 2 (TWO) TIMES DAILY. FOR HEARTBURN. 180 capsule 2   fluticasone (FLONASE) 50 MCG/ACT nasal spray SPRAY 1 SPRAY INTO BOTH NOSTRILS DAILY. 48 mL 3   fluticasone furoate-vilanterol (BREO ELLIPTA) 100-25 MCG/ACT AEPB Inhale 1 puff into the lungs daily. 60 each 6   hydrOXYzine (VISTARIL) 50 MG capsule TAKE 1 CAPSULE BY MOUTH ONCE OR TWICE DAILY AS NEEDED FOR ANXIETY 180 capsule 0   ibuprofen  (ADVIL) 800 MG tablet Take 1 tablet (800 mg total) by mouth 3 (three) times daily. 21 tablet 0   levocetirizine (XYZAL) 5 MG tablet TAKE 1 TABLET BY MOUTH EVERY DAY IN THE EVENING FOR ALLERGIES 90 tablet 0   Misc Natural Products (TUMERSAID PO) Take 500 mg by mouth daily.     montelukast (SINGULAIR) 10 MG tablet TAKE 1 TABLET BY MOUTH AT BEDTIME FOR ALLERGIES. OFFICE VISIT REQUIRED WITH PCP IN LATE JANUARY 2023 FOR FURTHER REFILLS. 30 tablet 0   Multiple Vitamin (MULTIVITAMIN) capsule Take 1 capsule by mouth daily.     rizatriptan (MAXALT) 10 MG tablet Take 1 tablet at migraine onset, may repeat in 2 hours if needed.  Do not exceed 2 tablets in 24 hours. 10 tablet 0   rosuvastatin (CRESTOR) 5 MG tablet Take 1 tablet (5 mg total) by mouth daily. For cholesterol. Office visit required with PCP in late January 2023 for further refills. 30 tablet 0   vitamin B-12 (CYANOCOBALAMIN) 1000 MCG tablet Take 1,000 mcg by mouth daily.     vitamin C (ASCORBIC ACID) 500 MG tablet Take 500 mg by mouth daily.     vitamin E 100 UNIT capsule Take 400 Units by mouth daily.     budesonide-formoterol (SYMBICORT) 160-4.5 MCG/ACT inhaler Inhale 2 puffs into the lungs 2 (two) times daily. (Patient not taking: Reported on 04/28/2021) 1 each 6   traMADol (ULTRAM) 50 MG tablet Take 1 tablet (50 mg total) by mouth every 6 (six) hours as needed. 15 tablet 0   No facility-administered medications prior to visit.    Allergies  Allergen Reactions   Advair Diskus [Fluticasone-Salmeterol] Other (See Comments)    "dries my throat out really bad'   Lipitor [Atorvastatin Calcium] Other (See Comments)    Elevations in blood pressure?    ROS Review of Systems  Constitutional:  Negative for chills and fatigue.  HENT:  Positive for congestion, postnasal drip, sinus pressure and sinus pain. Negative for sore throat.   Respiratory:  Negative for shortness of breath.   Cardiovascular:  Negative for chest pain and leg swelling.   Gastrointestinal:  Negative for diarrhea and nausea.  Genitourinary:  Negative for difficulty urinating.  Musculoskeletal:  Positive for arthralgias (right elbow pain with movement and tenderness to touch) and joint swelling (swelling around right elbow joint and forearm). Negative for myalgias.  Neurological:  Positive for dizziness (when rising quickly or turning head to the side at times). Negative for tremors, weakness and headaches.  Psychiatric/Behavioral:  Negative for agitation and sleep disturbance.   All  other systems reviewed and are negative.    Objective:    Physical Exam Constitutional:      General: She is not in acute distress.    Appearance: She is obese. She is not ill-appearing, toxic-appearing or diaphoretic.  HENT:     Right Ear: Tympanic membrane normal.     Left Ear: Tympanic membrane normal.     Nose:     Right Sinus: Maxillary sinus tenderness and frontal sinus tenderness present.     Left Sinus: Maxillary sinus tenderness and frontal sinus tenderness present.     Mouth/Throat:     Mouth: Mucous membranes are moist.     Pharynx: Posterior oropharyngeal erythema present. No pharyngeal swelling or oropharyngeal exudate.     Tonsils: No tonsillar exudate.  Eyes:     Pupils: Pupils are equal, round, and reactive to light.  Cardiovascular:     Rate and Rhythm: Normal rate.  Musculoskeletal:     Right elbow: Swelling (see fig 1) present. Normal range of motion. Tenderness (spot tenderness at right lateral and medial epicondyle with mild edema noted at medical epicondyle) present in medial epicondyle (tenderness with mild edema) and lateral epicondyle (tenderness).     Left elbow: Normal.     Right hand: Normal strength.     Left hand: Normal strength.       Arms:  Neurological:     Mental Status: She is alert.     Cranial Nerves: Cranial nerves 2-12 are intact. No cranial nerve deficit.     Sensory: Sensation is intact.     Coordination: Romberg sign  negative. Coordination normal. Finger-Nose-Finger Test and Heel to [facility] Test normal. Rapid alternating movements normal.     Gait: Gait is intact.     Comments: Positive dix hallpike man to the right Positive nystagmus with rising from lying to sitting     BP 126/88    Pulse 70    Ht 5\' 9"  (1.753 m)    Wt 229 lb (103.9 kg)    LMP 04/17/2021    SpO2 96%    BMI 33.82 kg/m  Wt Readings from Last 3 Encounters:  04/28/21 229 lb (103.9 kg)  01/13/21 234 lb 9.6 oz (106.4 kg)  09/28/20 234 lb (106.1 kg)     Health Maintenance Due  Topic Date Due   COVID-19 Vaccine (1) Never done   HIV Screening  Never done   Hepatitis C Screening  Never done   COLONOSCOPY (Pts [id number] Insurance coverage will need to be confirmed)  07/12/2019    There are no preventive care reminders to display for this patient.  Lab Results  Component Value Date   TSH 0.89 03/17/2020   Lab Results  Component Value Date   WBC 6.4 04/04/2020   HGB 13.5 04/04/2020   HCT 38.8 04/04/2020   MCV 86.4 04/04/2020   PLT 181.0 04/04/2020   Lab Results  Component Value Date   NA 139 03/17/2020   K 4.2 03/17/2020   CO2 30 03/17/2020   GLUCOSE 83 03/17/2020   BUN 17 03/17/2020   CREATININE 0.86 03/17/2020   BILITOT 0.9 03/17/2020   ALKPHOS 43 03/17/2020   AST 29 03/17/2020   ALT 51 (H) 03/17/2020   PROT 7.2 03/17/2020   ALBUMIN 4.8 03/17/2020   CALCIUM 9.7 03/17/2020   ANIONGAP 10 02/20/2018   GFR 81.44 03/17/2020   Lab Results  Component Value Date   HGBA1C 5.5 03/17/2020      Assessment & Plan:  Problem List Items Addressed This Visit       Respiratory   Chronic rhinitis   Acute recurrent frontal sinusitis    Prescription given for augmentin 875/125 mg po bid for ten days. Pt to continue tylenol/ibuprofen prn sinus pain. Continue with humidifier prn and steam showers recommended as well. instructed If no symptom improvement in 48 hours please f/u       Relevant Medications    amoxicillin-clavulanate (AUGMENTIN) 875-125 MG tablet   methylPREDNISolone (MEDROL DOSEPAK) 4 MG TBPK tablet     Nervous and Auditory   BPV (benign positional vertigo), right    Meclizine sent prn  amb ref ent as ongoing , possible epley maneuver Continue with flonase xyzal and singulair daily      Relevant Medications   meclizine (ANTIVERT) 25 MG tablet   Other Relevant Orders   Ambulatory referral to ENT     Musculoskeletal and Integument   Right tennis elbow    Recommended elbow brace to reduce pressure on the tendon Rest throughout the weekend apply heat and ice alternatively pending x-ray of the elbow as patient wishes to proceed with this Naproxen ibuprofen and/or Tylenol as needed      Relevant Medications   methylPREDNISolone (MEDROL DOSEPAK) 4 MG TBPK tablet     Other   Right elbow pain - Primary   Relevant Orders   DG Elbow Complete Right   RESOLVED: Vertigo    Meclizine prn        Meds ordered this encounter  Medications   meclizine (ANTIVERT) 25 MG tablet    Sig: Take 1 tablet (25 mg total) by mouth 2 (two) times daily as needed for up to 10 days for dizziness.    Dispense:  20 tablet    Refill:  0    Order Specific Question:   Supervising Provider    Answer:   BEDSOLE, AMY E [[id number]]   amoxicillin-clavulanate (AUGMENTIN) 875-125 MG tablet    Sig: Take 1 tablet by mouth 2 (two) times daily.    Dispense:  20 tablet    Refill:  0    Order Specific Question:   Supervising Provider    Answer:   BEDSOLE, AMY E [[id number]]   methylPREDNISolone (MEDROL DOSEPAK) 4 MG TBPK tablet    Sig: Take per package instructions    Dispense:  21 tablet    Refill:  0    Pt has issues with powder from advair only tolerates steroid well    Order Specific Question:   Supervising Provider    Answer:   Ermalene Searing, AMY E [[id number]]    Follow-up: Return if symptoms worsen or fail to improve.    Mort Sawyers, FNP

## 2021-04-28 NOTE — Patient Instructions (Addendum)
I suggest taking naproxen, ibuprofen for tennis elbow. ?Getting elbow brace and wearing daily.  ? ?Start prescription of augmentin 875/125 mg and take as prescribed.  ?Tylenol/ibuprofen ok for sinus pain as needed ?Increase oral fluids. Ok to continue with humidifers and hot steamy showers as discussed during visit. ? ?Meclizine as needed for vertigo, may make you tired so take when not at work to see how you do with it.  ? ?It was a pleasure speaking with you today, I hope you start feeling better soon. ? ?Regards,  ? ?Juandaniel Manfredo ? ? ? ? ?

## 2021-05-04 ENCOUNTER — Encounter: Payer: Self-pay | Admitting: Family

## 2021-05-04 NOTE — Telephone Encounter (Signed)
Just an FYI pt was only just seen on 3/3 so might take a bit longer for referral. But if pt still having issues tell her she can be seen again in office to reassess especially since now with new symptoms.  ?

## 2021-05-04 NOTE — Telephone Encounter (Signed)
Referral sent to [facility].  ?They will call patient to schedule or the patient can call. Mychart message sent to patient making her aware.  ?

## 2021-05-06 ENCOUNTER — Other Ambulatory Visit: Payer: Self-pay | Admitting: Primary Care

## 2021-05-06 DIAGNOSIS — F411 Generalized anxiety disorder: Secondary | ICD-10-CM

## 2021-05-07 ENCOUNTER — Other Ambulatory Visit: Payer: Self-pay | Admitting: Emergency Medicine

## 2021-05-09 ENCOUNTER — Other Ambulatory Visit: Payer: Self-pay | Admitting: Emergency Medicine

## 2021-05-09 ENCOUNTER — Other Ambulatory Visit: Payer: Self-pay | Admitting: Primary Care

## 2021-05-09 DIAGNOSIS — E785 Hyperlipidemia, unspecified: Secondary | ICD-10-CM

## 2021-05-09 DIAGNOSIS — J4540 Moderate persistent asthma, uncomplicated: Secondary | ICD-10-CM

## 2021-05-23 ENCOUNTER — Other Ambulatory Visit: Payer: Self-pay | Admitting: Primary Care

## 2021-05-23 DIAGNOSIS — J4540 Moderate persistent asthma, uncomplicated: Secondary | ICD-10-CM

## 2021-06-09 ENCOUNTER — Encounter: Payer: Self-pay | Admitting: Primary Care

## 2021-06-09 ENCOUNTER — Other Ambulatory Visit: Payer: Self-pay | Admitting: Primary Care

## 2021-06-09 ENCOUNTER — Ambulatory Visit (INDEPENDENT_AMBULATORY_CARE_PROVIDER_SITE_OTHER): Payer: Managed Care, Other (non HMO) | Admitting: Primary Care

## 2021-06-09 VITALS — BP 126/74 | HR 72 | Temp 98.7°F | Ht 69.0 in | Wt 229.0 lb

## 2021-06-09 DIAGNOSIS — K219 Gastro-esophageal reflux disease without esophagitis: Secondary | ICD-10-CM

## 2021-06-09 DIAGNOSIS — E559 Vitamin D deficiency, unspecified: Secondary | ICD-10-CM

## 2021-06-09 DIAGNOSIS — Z114 Encounter for screening for human immunodeficiency virus [HIV]: Secondary | ICD-10-CM

## 2021-06-09 DIAGNOSIS — E785 Hyperlipidemia, unspecified: Secondary | ICD-10-CM

## 2021-06-09 DIAGNOSIS — J0111 Acute recurrent frontal sinusitis: Secondary | ICD-10-CM

## 2021-06-09 DIAGNOSIS — R911 Solitary pulmonary nodule: Secondary | ICD-10-CM

## 2021-06-09 DIAGNOSIS — K5900 Constipation, unspecified: Secondary | ICD-10-CM

## 2021-06-09 DIAGNOSIS — J4540 Moderate persistent asthma, uncomplicated: Secondary | ICD-10-CM | POA: Diagnosis not present

## 2021-06-09 DIAGNOSIS — Z1159 Encounter for screening for other viral diseases: Secondary | ICD-10-CM

## 2021-06-09 DIAGNOSIS — J310 Chronic rhinitis: Secondary | ICD-10-CM

## 2021-06-09 DIAGNOSIS — H8111 Benign paroxysmal vertigo, right ear: Secondary | ICD-10-CM

## 2021-06-09 DIAGNOSIS — R519 Headache, unspecified: Secondary | ICD-10-CM

## 2021-06-09 DIAGNOSIS — F411 Generalized anxiety disorder: Secondary | ICD-10-CM

## 2021-06-09 DIAGNOSIS — Z0001 Encounter for general adult medical examination with abnormal findings: Secondary | ICD-10-CM

## 2021-06-09 DIAGNOSIS — I10 Essential (primary) hypertension: Secondary | ICD-10-CM | POA: Diagnosis not present

## 2021-06-09 DIAGNOSIS — G4733 Obstructive sleep apnea (adult) (pediatric): Secondary | ICD-10-CM

## 2021-06-09 LAB — COMPREHENSIVE METABOLIC PANEL
ALT: 19 U/L (ref 0–35)
AST: 21 U/L (ref 0–37)
Albumin: 4.4 g/dL (ref 3.5–5.2)
Alkaline Phosphatase: 46 U/L (ref 39–117)
BUN: 14 mg/dL (ref 6–23)
CO2: 26 mEq/L (ref 19–32)
Calcium: 9 mg/dL (ref 8.4–10.5)
Chloride: 103 mEq/L (ref 96–112)
Creatinine, Ser: 0.73 mg/dL (ref 0.40–1.20)
GFR: 98.28 mL/min (ref >60.00)
Glucose, Bld: 86 mg/dL (ref 70–99)
Potassium: 4.2 mEq/L (ref 3.5–5.1)
Sodium: 137 mEq/L (ref 135–145)
Total Bilirubin: 1.1 mg/dL (ref 0.2–1.2)
Total Protein: 6.6 g/dL (ref 6.0–8.3)

## 2021-06-09 LAB — CBC
HCT: 40.3 % (ref 36.0–46.0)
Hemoglobin: 13.5 g/dL (ref 12.0–15.0)
MCHC: 33.4 g/dL (ref 30.0–36.0)
MCV: 88.5 fl (ref 78.0–100.0)
Platelets: 202 10*3/uL (ref 150.0–400.0)
RBC: 4.55 Mil/uL (ref 3.87–5.11)
RDW: 13.5 % (ref 11.5–15.5)
WBC: 4.8 10*3/uL (ref 4.0–10.5)

## 2021-06-09 LAB — LIPID PANEL
Cholesterol: 203 mg/dL — ABNORMAL HIGH (ref 0–200)
HDL: 42.7 mg/dL (ref >39.00)
NonHDL: 160.37
Total CHOL/HDL Ratio: 5
Triglycerides: 237 mg/dL — ABNORMAL HIGH (ref 0.0–149.0)
VLDL: 47.4 mg/dL — ABNORMAL HIGH (ref 0.0–40.0)

## 2021-06-09 LAB — LDL CHOLESTEROL, DIRECT: Direct LDL: 135 mg/dL

## 2021-06-09 LAB — HEMOGLOBIN A1C: Hgb A1c MFr Bld: 5.6 % (ref 4.6–6.5)

## 2021-06-09 LAB — VITAMIN D 25 HYDROXY (VIT D DEFICIENCY, FRACTURES): VITD: 43.01 ng/mL (ref 30.00–100.00)

## 2021-06-09 NOTE — Assessment & Plan Note (Signed)
Tetanus up-to-date. ?Pap smear up-to-date. ?Colonoscopy due, she is aware and will schedule. ? ?Discussed the importance of a healthy diet and regular exercise in order for weight loss, and to reduce the risk of further co-morbidity. ? ?Exam today stable.  ?Labs pending. ?

## 2021-06-09 NOTE — Assessment & Plan Note (Signed)
Repeat lipid panel pending. Continue rosuvastatin 5 mg daily. 

## 2021-06-09 NOTE — Assessment & Plan Note (Signed)
Overall stable. ? ?Infrequent use of Maxalt.  ? ?Continue rizatriptan 10 mg as needed. ?

## 2021-06-09 NOTE — Assessment & Plan Note (Signed)
Following with pulmonology, office notes reviewed from November 2022. ? ?Continue Breo 100-25 mcg daily, albuterol PRN, Flonase, Xyzal 5 mg daily, Singulair 10 mg daily. ?

## 2021-06-09 NOTE — Assessment & Plan Note (Addendum)
Not using CPAP machine given recurrent sinusitis. ? ?Following with pulmonology. ?Office notes reviewed from November 2022. ?

## 2021-06-09 NOTE — Assessment & Plan Note (Signed)
Continued.  ?No improvement despite two rounds of antibiotics.  ? ?Will order CT sinus can. ?Will need refer to ENT within network.  ? ?Continue saline nasal sprays and Neti Pot rinses. ?

## 2021-06-09 NOTE — Assessment & Plan Note (Signed)
Controlled. ? ?Continue Nexium 40 mg BID. ?

## 2021-06-09 NOTE — Assessment & Plan Note (Signed)
Continued, likely induced by sinusitis. ? ?Continue to monitor.  ?

## 2021-06-09 NOTE — Assessment & Plan Note (Signed)
Chronic and ongoing. ? ?CT sinus scan pending. ? ?Continue levocetirizine 5 mg daily, Singulair 10 mg daily, saline nasal spray, Flonase nasal spray, sinus rinses. ?

## 2021-06-09 NOTE — Assessment & Plan Note (Signed)
Improved with daily fiber supplement. ? ?Continue to monitor. ?

## 2021-06-09 NOTE — Assessment & Plan Note (Signed)
Continue vitamin D 5000 IU daily. ? ?Repeat vitamin D level pending. ?

## 2021-06-09 NOTE — Assessment & Plan Note (Signed)
Controlled. ? ?Continue off medication. ?Continue to monitor.  ?

## 2021-06-09 NOTE — Assessment & Plan Note (Signed)
Controlled. ? ?Continue hydroxyzine 50 mg twice daily. ? ? ?

## 2021-06-09 NOTE — Assessment & Plan Note (Signed)
Reviewed CT chest from February 2023, due again for repeat imaging in 6-12 months. ? ?Following with pulmonology  ?

## 2021-06-09 NOTE — Progress Notes (Signed)
? ?Subjective:  ? ? Patient ID: Hannah Klein, female    DOB: 1975-12-05, 46 y.o.   MRN: [id number] ? ?HPI ? ?Hannah Klein is a very pleasant 46 y.o. female who presents today for complete physical and follow up of chronic conditions. ? ?She would also like to discuss ongoing bilateral sinus pressure, right worse than left. Evaluated by Lawerance Bach, NP 1 month ago, treated for Augmentin x 10 days. Evaluated by ENT soon after and prescribed Doxycycline 100 mg BID x 10 day. No improvement in sinus pressure. She is due to see ENT next week, but she cannot afford as he is not in his network. She is due for CT sinus. She's compliant to saline nasal sprays and her typical allergy regimen.  ? ?Immunizations: ?-Tetanus: 2019 ?-Influenza: Did not complete last season  ?-Covid-19: Has not completed  ? ?Diet: Fair diet.  ?Exercise: No regular exercise. ? ?Eye exam: Completed 2 years ago ?Dental exam: Completed 2 years ago ? ?Pap Smear: Completed in 2022 ?Mammogram: Completed in August 2022 ?Colonoscopy: Completed in 2011, due and is aware. She will schedule.  ? ? ?BP Readings from Last 3 Encounters:  ?06/09/21 126/74  ?04/28/21 126/88  ?01/13/21 118/82  ? ?Wt Readings from Last 3 Encounters:  ?06/09/21 229 lb (103.9 kg)  ?04/28/21 229 lb (103.9 kg)  ?01/13/21 234 lb 9.6 oz (106.4 kg)  ? ? ? ? ?Review of Systems  ?Constitutional:  Negative for unexpected weight change.  ?HENT:  Positive for congestion, rhinorrhea and sinus pressure.   ?Eyes:  Negative for visual disturbance.  ?Respiratory:  Negative for cough and shortness of breath.   ?Cardiovascular:  Negative for chest pain.  ?Gastrointestinal:  Negative for constipation and diarrhea.  ?Genitourinary:  Negative for difficulty urinating.  ?Musculoskeletal:  Negative for arthralgias and myalgias.  ?Skin:  Negative for rash.  ?Allergic/Immunologic: Positive for environmental allergies.  ?Neurological:  Positive for headaches. Negative for dizziness.   ?Psychiatric/Behavioral:  The patient is not nervous/anxious.   ? ?   ? ? ?Past Medical History:  ?Diagnosis Date  ? Arthritis   ? Asthma   ? Environmental allergies   ? Generalized anxiety disorder   ? GERD (gastroesophageal reflux disease)   ? Hx of colonic polyps   ? Hypertension   ? Migraines   ? MRSA (methicillin resistant Staphylococcus aureus)   ? Lt upper arm  approx 8 years ago  ? ? ?Social History  ? ?Socioeconomic History  ? Marital status: Single  ?  Spouse name: Not on file  ? Number of children: Not on file  ? Years of education: Not on file  ? Highest education level: Not on file  ?Occupational History  ? Not on file  ?Tobacco Use  ? Smoking status: Former  ?  Packs/day: 2.00  ?  Years: 18.00  ?  Pack years: 36.00  ?  Types: Cigarettes  ?  Start date: 81  ?  Quit date: 2009  ?  Years since quitting: 14.2  ? Smokeless tobacco: Never  ?Substance and Sexual Activity  ? Alcohol use: No  ?  Alcohol/week: 0.0 standard drinks  ? Drug use: No  ? Sexual activity: Not Currently  ?Other Topics Concern  ? Not on file  ?Social History Narrative  ? Single.  ? Moved from [place].  ? 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  ? ?Social Determinants of Health  ? ?Financial Resource Strain: Not on file  ?Food Insecurity: Not on file  ?Transportation  Needs: Not on file  ?Physical Activity: Not on file  ?Stress: Not on file  ?Social Connections: Not on file  ?Intimate Partner Violence: Not on file  ? ? ?Past Surgical History:  ?Procedure Laterality Date  ? CHOLECYSTECTOMY    ? DILATION AND CURETTAGE OF UTERUS    ? NECK SURGERY    ? Fusion to C6 and C7  ? WRIST ARTHROSCOPY WITH DEBRIDEMENT Left 02/27/2018  ? Procedure: WRIST ARTHROSCOPY WITH DEBRIDEMENT;  Surgeon: Leanora Cover, MD;  Location: [facility];  Service: Orthopedics;  Laterality: Left;  ? ? ?History reviewed. No pertinent family history. ? ?Allergies  ?Allergen Reactions  ? Advair Diskus [Fluticasone-Salmeterol] Other (See Comments)  ?  "dries my throat out  really bad'  ? Lipitor [Atorvastatin Calcium] Other (See Comments)  ?  Elevations in blood pressure?  ? ? ?Current Outpatient Medications on File Prior to Visit  ?Medication Sig Dispense Refill  ? albuterol (VENTOLIN HFA) 108 (90 Base) MCG/ACT inhaler INHALE 1 TO 2 PUFFS INTO THE LUNGS EVERY 6 HOURS AS NEEDED FOR WHEEZING OR SHORTNESS OF BREATH 8.5 g 0  ? bacitracin-polymyxin b (POLYSPORIN) ophthalmic ointment Place 1 application into both eyes 2 (two) times daily. apply to eye every 12 hours while awake 3.5 g 0  ? Cholecalciferol (VITAMIN D3 PO) Take 1,000 Units by mouth.    ? docusate sodium (COLACE) 100 MG capsule Take 100 mg by mouth daily.    ? esomeprazole (NEXIUM) 40 MG capsule TAKE 1 CAPSULE (40 MG TOTAL) BY MOUTH 2 (TWO) TIMES DAILY. FOR HEARTBURN. 180 capsule 2  ? fluticasone (FLONASE) 50 MCG/ACT nasal spray INSTILL 1 SPRAY INTO BOTH NOSTRILS DAILY 48 mL 3  ? fluticasone furoate-vilanterol (BREO ELLIPTA) 100-25 MCG/ACT AEPB Inhale 1 puff into the lungs daily. 60 each 6  ? hydrOXYzine (VISTARIL) 50 MG capsule TAKE 1 CAPSULE BY MOUTH ONCE OR TWICE DAILY AS NEEDED FOR ANXIETY 180 capsule 0  ? levocetirizine (XYZAL) 5 MG tablet TAKE 1 TABLET BY MOUTH EVERY DAY IN THE EVENING FOR ALLERGIES 90 tablet 0  ? Misc Natural Products (TUMERSAID PO) Take 500 mg by mouth daily.    ? montelukast (SINGULAIR) 10 MG tablet Take 1 tablet (10 mg total) by mouth at bedtime. For allergies. Office visit required for further refills. 30 tablet 0  ? Multiple Vitamin (MULTIVITAMIN) capsule Take 1 capsule by mouth daily.    ? rizatriptan (MAXALT) 10 MG tablet Take 1 tablet at migraine onset, may repeat in 2 hours if needed.  Do not exceed 2 tablets in 24 hours. 10 tablet 0  ? rosuvastatin (CRESTOR) 5 MG tablet TAKE 1 TABLET BY MOUTH DAILY FOR CHOLESTEROL NEED OFFICE VISIT FOR FURTHER REFILLS 30 tablet 0  ? vitamin B-12 (CYANOCOBALAMIN) 1000 MCG tablet Take 1,000 mcg by mouth daily.    ? vitamin C (ASCORBIC ACID) 500 MG tablet Take  500 mg by mouth daily.    ? vitamin E 100 UNIT capsule Take 400 Units by mouth daily.    ? ?No current facility-administered medications on file prior to visit.  ? ? ?BP 126/74   Pulse 72   Temp 98.7 ?F (37.1 ?C) (Oral)   Ht 5\' 9"  (1.753 m)   Wt 229 lb (103.9 kg)   SpO2 98%   BMI 33.82 kg/m?  ?Objective:  ? Physical Exam ?HENT:  ?   Right Ear: Tympanic membrane and ear canal normal.  ?   Left Ear: Tympanic membrane and ear canal normal.  ?  Nose:  ?   Right Sinus: Maxillary sinus tenderness and frontal sinus tenderness present.  ?   Left Sinus: Maxillary sinus tenderness and frontal sinus tenderness present.  ?Eyes:  ?   Conjunctiva/sclera: Conjunctivae normal.  ?   Pupils: Pupils are equal, round, and reactive to light.  ?Neck:  ?   Thyroid: No thyromegaly.  ?Cardiovascular:  ?   Rate and Rhythm: Normal rate and regular rhythm.  ?   Heart sounds: No murmur heard. ?Pulmonary:  ?   Effort: Pulmonary effort is normal.  ?   Breath sounds: Normal breath sounds. No rales.  ?Abdominal:  ?   General: Bowel sounds are normal.  ?   Palpations: Abdomen is soft.  ?   Tenderness: There is no abdominal tenderness.  ?Musculoskeletal:     ?   General: Normal range of motion.  ?   Cervical back: Neck supple.  ?Lymphadenopathy:  ?   Cervical: No cervical adenopathy.  ?Skin: ?   General: Skin is warm and dry.  ?   Findings: No rash.  ?Neurological:  ?   Mental Status: She is alert and oriented to person, place, and time.  ?   Cranial Nerves: No cranial nerve deficit.  ?   Deep Tendon Reflexes: Reflexes are normal and symmetric.  ? ? ? ? ? ?   ?Assessment & Plan:  ? ? ? ? ?This visit occurred during the SARS-CoV-2 public health emergency.  Safety protocols were in place, including screening questions prior to the visit, additional usage of staff PPE, and extensive cleaning of exam room while observing appropriate contact time as indicated for disinfecting solutions.  ?

## 2021-06-09 NOTE — Patient Instructions (Signed)
Stop by the lab prior to leaving today. I will notify you of your results once received.  ? ?You will be contacted regarding your CT sinus scan.  Please let us know if you have not been contacted within one week. ? ?Call GI for the colonoscopy. ? ?It was a pleasure to see you today! ? ?Preventive Care 24-47 Years Old, Female ?Preventive care refers to lifestyle choices and visits with your health care provider that can promote health and wellness. Preventive care visits are also called wellness exams. ?What can I expect for my preventive care visit? ?Counseling ?Your health care provider may ask you questions about your: ?Medical history, including: ?Past medical problems. ?Family medical history. ?Pregnancy history. ?Current health, including: ?Menstrual cycle. ?Method of birth control. ?Emotional well-being. ?Home life and relationship well-being. ?Sexual activity and sexual health. ?Lifestyle, including: ?Alcohol, nicotine or tobacco, and drug use. ?Access to firearms. ?Diet, exercise, and sleep habits. ?Work and work Statistician. ?Sunscreen use. ?Safety issues such as seatbelt and bike helmet use. ?Physical exam ?Your health care provider will check your: ?Height and weight. These may be used to calculate your BMI (body mass index). BMI is a measurement that tells if you are at a healthy weight. ?Waist circumference. This measures the distance around your waistline. This measurement also tells if you are at a healthy weight and may help predict your risk of certain diseases, such as type 2 diabetes and high blood pressure. ?Heart rate and blood pressure. ?Body temperature. ?Skin for abnormal spots. ?What immunizations do I need? ? ?Vaccines are usually given at various ages, according to a schedule. Your health care provider will recommend vaccines for you based on your age, medical history, and lifestyle or other factors, such as travel or where you work. ?What tests do I need? ?Screening ?Your health care  provider may recommend screening tests for certain conditions. This may include: ?Lipid and cholesterol levels. ?Diabetes screening. This is done by checking your blood sugar (glucose) after you have not eaten for a while (fasting). ?Pelvic exam and Pap test. ?Hepatitis B test. ?Hepatitis C test. ?HIV (human immunodeficiency virus) test. ?STI (sexually transmitted infection) testing, if you are at risk. ?Lung cancer screening. ?Colorectal cancer screening. ?Mammogram. Talk with your health care provider about when you should start having regular mammograms. This may depend on whether you have a family history of breast cancer. ?BRCA-related cancer screening. This may be done if you have a family history of breast, ovarian, tubal, or peritoneal cancers. ?Bone density scan. This is done to screen for osteoporosis. ?Talk with your health care provider about your test results, treatment options, and if necessary, the need for more tests. ?Follow these instructions at home: ?Eating and drinking ? ?Eat a diet that includes fresh fruits and vegetables, whole grains, lean protein, and low-fat dairy products. ?Take vitamin and mineral supplements as recommended by your health care provider. ?Do not drink alcohol if: ?Your health care provider tells you not to drink. ?You are pregnant, may be pregnant, or are planning to become pregnant. ?If you drink alcohol: ?Limit how much you have to 0-1 drink a day. ?Know how much alcohol is in your drink. In the [place] one drink equals one 12 oz bottle of beer (355 mL), one 5 oz glass of wine (148 mL), or one 1? oz glass of hard liquor (44 mL). ?Lifestyle ?Brush your teeth every morning and night with fluoride toothpaste. Floss one time each day. ?Exercise for at least 30 minutes 5  or more days each week. ?Do not use any products that contain nicotine or tobacco. These products include cigarettes, chewing tobacco, and vaping devices, such as e-cigarettes. If you need help quitting, ask  your health care provider. ?Do not use drugs. ?If you are sexually active, practice safe sex. Use a condom or other form of protection to prevent STIs. ?If you do not wish to become pregnant, use a form of birth control. If you plan to become pregnant, see your health care provider for a prepregnancy visit. ?Take aspirin only as told by your health care provider. Make sure that you understand how much to take and what form to take. Work with your health care provider to find out whether it is safe and beneficial for you to take aspirin daily. ?Find healthy ways to manage stress, such as: ?Meditation, yoga, or listening to music. ?Journaling. ?Talking to a trusted person. ?Spending time with friends and family. ?Minimize exposure to UV radiation to reduce your risk of skin cancer. ?Safety ?Always wear your seat belt while driving or riding in a vehicle. ?Do not drive: ?If you have been drinking alcohol. Do not ride with someone who has been drinking. ?When you are tired or distracted. ?While texting. ?If you have been using any mind-altering substances or drugs. ?Wear a helmet and other protective equipment during sports activities. ?If you have firearms in your house, make sure you follow all gun safety procedures. ?Seek help if you have been physically or sexually abused. ?What's next? ?Visit your health care provider once a year for an annual wellness visit. ?Ask your health care provider how often you should have your eyes and teeth checked. ?Stay up to date on all vaccines. ?This information is not intended to replace advice given to you by your health care provider. Make sure you discuss any questions you have with your health care provider. ?Document Revised: 08/10/2020 Document Reviewed: 08/10/2020 ?Elsevier Patient Education ? [place]. ? ? ?

## 2021-06-12 ENCOUNTER — Other Ambulatory Visit: Payer: Self-pay | Admitting: Primary Care

## 2021-06-12 DIAGNOSIS — E785 Hyperlipidemia, unspecified: Secondary | ICD-10-CM

## 2021-06-12 LAB — HIV ANTIBODY (ROUTINE TESTING W REFLEX): HIV 1&2 Ab, 4th Generation: NONREACTIVE

## 2021-06-12 LAB — HEPATITIS C ANTIBODY
Hepatitis C Ab: NONREACTIVE
SIGNAL TO CUT-OFF: <0.02 (ref <1.00)

## 2021-06-12 MED ORDER — ROSUVASTATIN CALCIUM 10 MG PO TABS: 10.0000 mg | ORAL_TABLET | Freq: Every day | ORAL | 3 refills | Status: DC

## 2021-06-15 ENCOUNTER — Ambulatory Visit
Admission: RE | Admit: 2021-06-15 | Discharge: 2021-06-15 | Disposition: A | Discharge status: Home / Self Care | Payer: Managed Care, Other (non HMO) | Source: Ambulatory Visit | Attending: Primary Care | Admitting: Primary Care

## 2021-06-15 DIAGNOSIS — J0111 Acute recurrent frontal sinusitis: Secondary | ICD-10-CM

## 2021-06-26 ENCOUNTER — Encounter: Payer: Self-pay | Admitting: Emergency Medicine

## 2021-06-27 NOTE — Progress Notes (Signed)
Dr. Byrum, please see mychart message sent by pt and advise. 

## 2021-07-04 ENCOUNTER — Other Ambulatory Visit: Payer: Self-pay | Admitting: Primary Care

## 2021-07-04 DIAGNOSIS — J4540 Moderate persistent asthma, uncomplicated: Secondary | ICD-10-CM

## 2021-07-28 ENCOUNTER — Other Ambulatory Visit: Payer: Self-pay | Admitting: Emergency Medicine

## 2021-07-28 DIAGNOSIS — J4540 Moderate persistent asthma, uncomplicated: Secondary | ICD-10-CM

## 2021-08-01 ENCOUNTER — Other Ambulatory Visit: Payer: Self-pay | Admitting: Primary Care

## 2021-08-01 DIAGNOSIS — K219 Gastro-esophageal reflux disease without esophagitis: Secondary | ICD-10-CM

## 2021-08-03 ENCOUNTER — Encounter: Payer: Self-pay | Admitting: Allergy & Immunology

## 2021-08-03 ENCOUNTER — Ambulatory Visit: Payer: Commercial Managed Care - HMO | Admitting: Allergy & Immunology

## 2021-08-03 VITALS — BP 122/78 | HR 72 | Temp 100.4°F | Resp 16 | Ht 67.0 in | Wt 227.1 lb

## 2021-08-03 DIAGNOSIS — J4540 Moderate persistent asthma, uncomplicated: Secondary | ICD-10-CM

## 2021-08-03 DIAGNOSIS — K9049 Malabsorption due to intolerance, not elsewhere classified: Secondary | ICD-10-CM | POA: Diagnosis not present

## 2021-08-03 DIAGNOSIS — K219 Gastro-esophageal reflux disease without esophagitis: Secondary | ICD-10-CM

## 2021-08-03 DIAGNOSIS — J3089 Other allergic rhinitis: Secondary | ICD-10-CM | POA: Diagnosis not present

## 2021-08-03 MED ORDER — BREZTRI AEROSPHERE 160-9-4.8 MCG/ACT IN AERO: 2.0000 | INHALATION_SPRAY | Freq: Two times a day (BID) | RESPIRATORY_TRACT | 5 refills | Status: DC

## 2021-08-03 MED ORDER — ALBUTEROL SULFATE HFA 108 (90 BASE) MCG/ACT IN AERS
INHALATION_SPRAY | RESPIRATORY_TRACT | 1 refills | Status: DC
Start: 2021-08-03 — End: 2021-10-31

## 2021-08-03 MED ORDER — FAMOTIDINE 40 MG PO TABS: 40.0000 mg | ORAL_TABLET | Freq: Two times a day (BID) | ORAL | 5 refills | Status: DC

## 2021-08-03 NOTE — Patient Instructions (Addendum)
1. Moderate persistent asthma, uncomplicated - Lung testing was in the mid 60% range and improved SLIGHTLY with the albuterol treatment. - Since Symbicort worked so well for you, we are going to change to a medication called Breztri (contains Symbicort and a third medication to help relax the lung muscles). - Sample and copay card provided today.  - We are going to get some labs to see if we can add a biologic medication to your regimen (injectable asthma medication). - Spacer sample and demonstration provided. - Daily controller medication(s): Breztri two puffs twice daily with spacer - Prior to physical activity: albuterol 2 puffs 10-15 minutes before physical activity. - Rescue medications: albuterol 4 puffs every 4-6 hours as needed - Asthma control goals:  * Full participation in all desired activities (may need albuterol before activity) * Albuterol use two time or less a week on average (not counting use with activity) * Cough interfering with sleep two time or less a month * Oral steroids no more than once a year * No hospitalizations  2. Chronic rhinitis - Testing today showed: dust mites and cockroach. - Copy of test results provided.  - Avoidance measures provided. - Stop taking: montelukast  - Continue with: Xyzal (levocetirizine) 5mg  tablet BUT INCREASE TO TWICE DAILY  - Start taking: Xhance (fluticasone) 1-2 sprays per nostril daily (use and see if there is any improvement in your symptoms) - You can use an extra dose of the antihistamine, if needed, for breakthrough symptoms.  - Consider nasal saline rinses 1-2 times daily to remove allergens from the nasal cavities as well as help with mucous clearance (this is especially helpful to do before the nasal sprays are given) - I do not think that we need to do allergy shots at this point in time.   3. Gastroesophageal reflux disease - Continue with Nexium twice daily. - Add on Pepcid (famotidine) 40mg  twice daily to see if  this has an additive effect on your reflux, which might be contributing to your worsening shortness of breath episodes.   4. Return in about 6 weeks (around 09/14/2021).    Please inform of any Emergency Department visits, hospitalizations, or changes in symptoms. Call 09/16/2021 before going to the ED for breathing or allergy symptoms since we might be able to fit you in for a sick visit. Feel free to contact us anytime with any questions, problems, or concerns.  It was a pleasure to meet you today!  Websites that have reliable patient information: 1. American Academy of Asthma, Allergy, and Immunology: www.aaaai.org 2. Food Allergy Research and Education (FARE): foodallergy.org 3. Mothers of Asthmatics: http://www.asthmacommunitynetwork.org 4. American College of Allergy, Asthma, and Immunology: www.acaai.org   COVID-19 Vaccine Information can be found at: [name] For questions related to vaccine distribution or appointments, please email [email or web address] or call [phone].   We realize that you might be concerned about having an allergic reaction to the COVID19 vaccines. To help with that concern, WE ARE OFFERING THE COVID19 VACCINES IN OUR OFFICE! Ask the front desk for dates!     "Like" [email or web address] on Facebook and Instagram for our latest updates!      A healthy democracy works best when [phone] participate! Make sure you are registered to vote! If you have moved or changed any of your contact information, you will need to get this updated before voting!  In some cases, you MAY be able to register to vote online: [name]     Airborne Adult Perc -  08/03/21 1032     Time Antigen Placed 0930    Allergen Manufacturer Waynette Buttery    Location Back    Number of Test 59    Panel 1 Select             Food Perc - 08/03/21 1033       Test Information   Time Antigen Placed  0930    Allergen Manufacturer Waynette Buttery    Location Back    Number of allergen test 10    Food Select             Intradermal - 08/03/21 0955     Time Antigen Placed 8756    Allergen Manufacturer Waynette Buttery    Location Arm    Number of Test 15    Intradermal Select    Control Negative    [place] Negative    [place] Negative    7 Grass Negative    Ragweed mix Negative    Weed mix Negative    Tree mix Negative    Mold 1 Negative    Mold 2 Negative    Mold 3 Negative    Mold 4 Negative    Cat Negative    Dog Negative    Cockroach 3+    Mite mix 2+             Control of Dust Mite Allergen    Dust mites play a major role in allergic asthma and rhinitis.  They occur in environments with high humidity wherever human skin is found.  Dust mites absorb humidity from the atmosphere (ie, they do not drink) and feed on organic matter (including shed human and animal skin).  Dust mites are a microscopic type of insect that you cannot see with the naked eye.  High levels of dust mites have been detected from mattresses, pillows, carpets, upholstered furniture, bed covers, clothes, soft toys and any woven material.  The principal allergen of the dust mite is found in its feces.  A gram of dust may contain 1,000 mites and 250,000 fecal particles.  Mite antigen is easily measured in the air during house cleaning activities.  Dust mites do not bite and do not cause harm to humans, other than by triggering allergies/asthma.    Ways to decrease your exposure to dust mites in your home:  Encase mattresses, box springs and pillows with a mite-impermeable barrier or cover   Wash sheets, blankets and drapes weekly in hot water (130 F) with detergent and dry them in a dryer on the hot setting.  Have the room cleaned frequently with a vacuum cleaner and a damp dust-mop.  For carpeting or rugs, vacuuming with a vacuum cleaner equipped with a high-efficiency particulate air (HEPA) filter.  The dust mite  allergic individual should not be in a room which is being cleaned and should wait 1 hour after cleaning before going into the room. Do not sleep on upholstered furniture (eg, couches).   If possible removing carpeting, upholstered furniture and drapery from the home is ideal.  Horizontal blinds should be eliminated in the rooms where the person spends the most time (bedroom, study, television room).  Washable vinyl, roller-type shades are optimal. Remove all non-washable stuffed toys from the bedroom.  Wash stuffed toys weekly like sheets and blankets above.   Reduce indoor humidity to less than 50%.  Inexpensive humidity monitors can be purchased at most hardware stores.  Do not use a humidifier as can make the problem worse  and are not recommended.    Control of Cockroach Allergen  Cockroach allergen has been identified as an important cause of acute attacks of asthma, especially in urban settings.  There are fifty-five species of cockroach that exist in the [place], however only three, the [place], [place] and Oriental species produce allergen that can affect patients with Asthma.  Allergens can be obtained from fecal particles, egg casings and secretions from cockroaches.    Remove food sources. Reduce access to water. Seal access and entry points. Spray runways with 0.5-1% Diazinon or Chlorpyrifos Blow boric acid power under stoves and refrigerator. Place bait stations (hydramethylnon) at feeding sites.   3

## 2021-08-03 NOTE — Progress Notes (Signed)
NEW PATIENT  Date of Service/Encounter:  08/04/21  Consult requested by: Doreene Nest, NP   Assessment:   Moderate persistent asthma, uncomplicated  Chronic rhinitis (dust mites and cockroach)  Gastroesophageal reflux disease  Moderate persistent asthma without complication  Normal EKG in 2019  Plan/Recommendations:   1. Moderate persistent asthma, uncomplicated - Lung testing was in the mid 60% range and improved SLIGHTLY with the albuterol treatment. - Since Symbicort worked so well for you, we are going to change to a medication called Breztri (contains Symbicort and a third medication to help relax the lung muscles). - Sample and copay card provided today.  - We are going to get some labs to see if we can add a biologic medication to your regimen (injectable asthma medication). - Spacer sample and demonstration provided. - Daily controller medication(s): Breztri two puffs twice daily with spacer - Prior to physical activity: albuterol 2 puffs 10-15 minutes before physical activity. - Rescue medications: albuterol 4 puffs every 4-6 hours as needed - Asthma control goals:  * Full participation in all desired activities (may need albuterol before activity) * Albuterol use two time or less a week on average (not counting use with activity) * Cough interfering with sleep two time or less a month * Oral steroids no more than once a year * No hospitalizations  2. Chronic rhinitis - Testing today showed: dust mites and cockroach. - Copy of test results provided.  - Avoidance measures provided. - Stop taking: montelukast  - Continue with: Xyzal (levocetirizine)  tablet BUT INCREASE TO TWICE DAILY  - Start taking: Xhance (fluticasone) 1-2 sprays per nostril daily (use and see if there is any improvement in your symptoms) - You can use an extra dose of the antihistamine, if needed, for breakthrough symptoms.  - Consider nasal saline rinses 1-2 times daily to remove  allergens from the nasal cavities as well as help with mucous clearance (this is especially helpful to do before the nasal sprays are given) - I do not think that we need to do allergy shots at this point in time.   3. Gastroesophageal reflux disease - Continue with Nexium twice daily. - Add on Pepcid (famotidine)  twice daily to see if this has an additive effect on your reflux, which might be contributing to your worsening shortness of breath episodes.   4. Return in about 6 weeks (around 09/14/2021).   This note in its entirety was forwarded to the Provider who requested this consultation.  Subjective:   Hannah Klein is a 46 y.o. female presenting today for evaluation of  Chief Complaint  Patient presents with   Allergy Testing    Allergy testing done in the past. Has came into the office for an updated allergy testing.    Asthma    Went to a pulmonologist. Has a hard time  when taking a deep breath. Used to get asthma flare ups but doesn't anymore.    Cough    After she eats she gets a cough w phlegm. She has tried to narrow it down to a specific food but it seems like it is everytime she eats.     Hannah Klein has a history of the following: Patient Active Problem List   Diagnosis Date Noted   Vitamin D deficiency 06/09/2021   Acute recurrent frontal sinusitis 04/28/2021   BPV (benign positional vertigo), right 04/28/2021   Right elbow pain 04/28/2021   Right tennis elbow 04/28/2021   Upper  airway cough syndrome 09/28/2020   Persistent cough for 3 weeks or longer 08/26/2020   Pulmonary nodule 04/29/2020   Dyspnea 04/04/2020   Chronic rhinitis 04/04/2020   OSA (obstructive sleep apnea) 04/04/2020   Essential hypertension 11/11/2017   Hyperlipidemia 11/29/2016   Encounter for annual general medical examination with abnormal findings in adult 11/23/2015   Constipation 11/23/2015   Generalized anxiety disorder 04/26/2015   Right-sided low back pain with  right-sided sciatica 04/26/2015   Asthma 04/26/2015   Frequent headaches 04/26/2015   Osteoarthritis 04/26/2015   GERD (gastroesophageal reflux disease) 04/26/2015    History obtained from: chart review and patient.  Hannah Klein was referred by Doreene Nest, NP. She moved here from [facility] around 7 years ago.  Hannah Klein is a 46 y.o. female presenting for an evaluation of allergies and asthma .   Asthma/Respiratory Symptom History: She is followed by Dr. Delton Coombes.  At his last visit in November 2022, she was continued on Symbicort since this seems to be working well.  Somewhere along the line she was changed to [facility], likely due to insurance coverage.  Symbicort was working back in the day. She has a history of swelling to the Advair powder back when she lived in  [place]. She was not using a spacer with the Symbicort.   She reports that she can get winded just by putting on her shoes. She can do fine when she is just doing every day things. She has some burning with deep breathes. This is a new thing.   She has been on prednisone in the past but she is not sure whether this was for breathing. She had a URI earlier this year. She has OSA and has a CPAP machine; she stopped using it after she got a sinus infection. The last time that she used it was February 2023. She cannot remember the last time that she had a sinus infections before February.   She had COVID in January 2023 which likely triggered the sinus infection.   Allergic Rhinitis Symptom History: She is currently on montelukast as well as levocetirizine for her allergies.  She has hydroxyzine as well which is mostly used for her anxiety.  She has been tested in the past when she lived in [place]. She was on shots but she could not afford them. She thinks that this was a year or two. She does not remember whether they helped at all. She was having some swelling under her eyes, but this improved when she stopped using her Flonase. She does not  like nose sprays at all. She is normally very dry anyway in her nasal passages. She does have a nasal spray that uses PRN.   She had a sinus CT that was completely normal in April 2023. This was done after two rounds of antibiotics. Antibiotics did seem to help, especially the second round of antibiotics.   Chest CT (February 2023): 1. New mild patchy ground-glass opacity at the periphery of the mid to lower lungs bilaterally, asymmetrically prominent on the left. Findings are nonspecific and most likely infectious or inflammatory with differential including atypical/viral infection. 2. Previously visualized small pulmonary nodules are stable and considered benign.  Food Allergy Symptom History: She has a phlegmy cough with ingestion of any food. This was only after eating ice cream. Now it is happening with "everything".  She never has hives or vomiting from a food.   GERD Symptom History: She currently is on Nexium for her GERD.  She is doing one tablet BID. She was on Pepcid years ago before the Nexium. She denies chest.   Otherwise, there is no history of other atopic diseases, including food allergies, drug allergies, stinging insect allergies, eczema, urticaria, or contact dermatitis. There is no significant infectious history. Vaccinations are up to date.    Past Medical History: Patient Active Problem List   Diagnosis Date Noted   Vitamin D deficiency 06/09/2021   Acute recurrent frontal sinusitis 04/28/2021   BPV (benign positional vertigo), right 04/28/2021   Right elbow pain 04/28/2021   Right tennis elbow 04/28/2021   Upper airway cough syndrome 09/28/2020   Persistent cough for 3 weeks or longer 08/26/2020   Pulmonary nodule 04/29/2020   Dyspnea 04/04/2020   Chronic rhinitis 04/04/2020   OSA (obstructive sleep apnea) 04/04/2020   Essential hypertension 11/11/2017   Hyperlipidemia 11/29/2016   Encounter for annual general medical examination with abnormal findings in adult  11/23/2015   Constipation 11/23/2015   Generalized anxiety disorder 04/26/2015   Right-sided low back pain with right-sided sciatica 04/26/2015   Asthma 04/26/2015   Frequent headaches 04/26/2015   Osteoarthritis 04/26/2015   GERD (gastroesophageal reflux disease) 04/26/2015    Medication List:  Allergies as of 08/03/2021       Reactions   Advair Diskus [fluticasone-salmeterol] Other (See Comments)   "dries my throat out really bad'   Flonase [fluticasone]    Nose bleeds   Lipitor [atorvastatin Calcium] Other (See Comments)   Elevations in blood pressure?        Medication List        Accurate as of August 03, 2021 11:59 PM. If you have any questions, ask your nurse or doctor.          albuterol 108 (90 Base) MCG/ACT inhaler Commonly known as: VENTOLIN HFA INHALE 1-2 PUFFS BY MOUTH EVERY 6 HOURS AS NEEDED FOR WHEEZE OR SHORTNESS OF BREATH   bacitracin-polymyxin b ophthalmic ointment Commonly known as: POLYSPORIN Place 1 application into both eyes 2 (two) times daily. apply to eye every 12 hours while awake   Breztri Aerosphere 160-9-4.8 MCG/ACT Aero Generic drug: Budeson-Glycopyrrol-Formoterol Inhale 2 puffs into the lungs in the morning and at bedtime. Started by: Alfonse Spruce, MD   docusate sodium 100 MG capsule Commonly known as: COLACE Take 100 mg by mouth daily.   esomeprazole 40 MG capsule Commonly known as: NEXIUM TAKE 1 CAPSULE (40 MG TOTAL) BY MOUTH 2 (TWO) TIMES DAILY. FOR HEARTBURN.   famotidine 40 MG tablet Commonly known as: Pepcid Take 1 tablet (40 mg total) by mouth 2 (two) times daily. Started by: Alfonse Spruce, MD   FIBER PO Take 1 tablet by mouth daily as needed.   fluticasone 50 MCG/ACT nasal spray Commonly known as: FLONASE INSTILL 1 SPRAY INTO BOTH NOSTRILS DAILY   fluticasone furoate-vilanterol 100-25 MCG/ACT Aepb Commonly known as: Breo Ellipta Inhale 1 puff into the lungs daily.   hydrOXYzine 50 MG  capsule Commonly known as: VISTARIL TAKE 1 CAPSULE BY MOUTH ONCE OR TWICE DAILY AS NEEDED FOR ANXIETY   levocetirizine 5 MG tablet Commonly known as: XYZAL TAKE 1 TABLET BY MOUTH EVERY DAY IN THE EVENING FOR ALLERGIES   montelukast 10 MG tablet Commonly known as: SINGULAIR Take 1 tablet (10 mg total) by mouth at bedtime. For allergies. Office visit required for further refills.   multivitamin capsule Take 1 capsule by mouth daily.   rizatriptan 10 MG tablet Commonly known as: Maxalt Take 1 tablet  at migraine onset, may repeat in 2 hours if needed.  Do not exceed 2 tablets in 24 hours.   rosuvastatin 10 MG tablet Commonly known as: Crestor Take 1 tablet (10 mg total) by mouth daily. for cholesterol.   TUMERSAID PO Take 500 mg by mouth daily.   vitamin B-12 1000 MCG tablet Commonly known as: CYANOCOBALAMIN Take 1,000 mcg by mouth daily.   vitamin C 500 MG tablet Commonly known as: ASCORBIC ACID Take 500 mg by mouth daily.   VITAMIN D3 PO Take 1,000 Units by mouth.   vitamin E 45 MG (100 UNITS) capsule Take 400 Units by mouth daily.        Birth History: non-contributory  Developmental History: non-contributory  Past Surgical History: Past Surgical History:  Procedure Laterality Date   CHOLECYSTECTOMY     DILATION AND CURETTAGE OF UTERUS     NECK SURGERY     Fusion to C6 and C7   TYMPANOSTOMY TUBE PLACEMENT     WRIST ARTHROSCOPY WITH DEBRIDEMENT Left 02/27/2018   Procedure: WRIST ARTHROSCOPY WITH DEBRIDEMENT;  Surgeon: Betha Loa, MD;  Location: [facility];  Service: Orthopedics;  Laterality: Left;     Family History: Family History  Problem Relation Age of Onset   Eczema Mother    Allergic rhinitis Brother    Asthma Brother    Asthma Brother    Allergic rhinitis Brother    Urticaria Other    Angioedema Other      Social History: Jaanai lives at home with her family.  She lives in a trailer.  It originates from the 1960s.   There is carpeting throughout the home.  She has electric heating and central cooling.  There are no animals inside or outside of the home.  There are no dust mite covers on the bedding.  There is no tobacco exposure.  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  She is not exposed to fumes, chemicals, or dust.  She did smoke from 1991 through 2009. She is around dogs at her workplace as well as cats. She does walk her brother's dogs.    Review of Systems  Constitutional: Negative.  Negative for fever, malaise/fatigue and weight loss.  HENT:  Positive for congestion. Negative for ear discharge and ear pain.   Eyes:  Negative for pain, discharge and redness.  Respiratory:  Positive for cough and shortness of breath. Negative for sputum production and wheezing.   Cardiovascular: Negative.  Negative for chest pain and palpitations.  Gastrointestinal:  Negative for abdominal pain, heartburn, nausea and vomiting.  Skin: Negative.  Negative for itching and rash.  Neurological:  Negative for dizziness and headaches.  Endo/Heme/Allergies:  Positive for environmental allergies. Does not bruise/bleed easily.       Objective:   Blood pressure 122/78, pulse 72, temperature (!) 100.4 F (38 C), resp. rate 16, height  (1.702 m), weight 227 lb 2 oz (103 kg), SpO2 95 %. Body mass index is 35.57 kg/m.     Physical Exam Vitals reviewed.  Constitutional:      Appearance: She is well-developed.     Comments: Very pleasant.   HENT:     Head: Normocephalic and atraumatic.     Right Ear: Tympanic membrane, ear canal and external ear normal. No drainage, swelling or tenderness. Tympanic membrane is not injected, scarred, erythematous, retracted or bulging.     Left Ear: Tympanic membrane, ear canal and external ear normal. No drainage, swelling or  tenderness. Tympanic membrane is not injected, scarred, erythematous, retracted or bulging.     Nose: No nasal deformity, septal  deviation, mucosal edema or rhinorrhea.     Right Turbinates: Enlarged, swollen and pale.     Left Turbinates: Enlarged, swollen and pale.     Right Sinus: No maxillary sinus tenderness or frontal sinus tenderness.     Left Sinus: No maxillary sinus tenderness or frontal sinus tenderness.     Comments: Turbinates erythematous.    Mouth/Throat:     Lips: Pink.     Mouth: Mucous membranes are moist. Mucous membranes are not pale and not dry.     Pharynx: Uvula midline.     Comments: Cobblestoning present in the posterior oropharynx. Eyes:     General: Lids are normal. Allergic shiner present.        Right eye: No discharge.        Left eye: No discharge.     Conjunctiva/sclera: Conjunctivae normal.     Right eye: Right conjunctiva is not injected. No chemosis.    Left eye: Left conjunctiva is not injected. No chemosis.    Pupils: Pupils are equal, round, and reactive to light.  Cardiovascular:     Rate and Rhythm: Normal rate and regular rhythm.     Heart sounds: Normal heart sounds.  Pulmonary:     Effort: Pulmonary effort is normal. No accessory muscle usage or respiratory distress.     Breath sounds: Normal breath sounds. No wheezing, rhonchi or rales.     Comments: Decreased air movement at the bases. Intermittently tachypneic. Chest:     Chest wall: No tenderness.  Abdominal:     Tenderness: There is no abdominal tenderness. There is no guarding or rebound.  Lymphadenopathy:     Head:     Right side of head: No submandibular, tonsillar or occipital adenopathy.     Left side of head: No submandibular, tonsillar or occipital adenopathy.     Cervical: No cervical adenopathy.  Skin:    Coloration: Skin is not pale.     Findings: No abrasion, erythema, petechiae or rash. Rash is not papular, urticarial or vesicular.  Neurological:     Mental Status: She is alert.  Psychiatric:        Behavior: Behavior is cooperative.      Diagnostic studies:    Spirometry: results  abnormal (FEV1: 1.99/64%, FVC: 2.46/63%, FEV1/FVC: 81%).    Spirometry consistent with possible restrictive disease. Albuterol four puffs via MDI treatment given in clinic with improvement in FEV1 and FVC, but not significant per ATS criteria.  Allergy Studies:    Routine  1. Control-Buffer 50% Glycerol Negative      2. Control-Histamine 1 mg/ml 2+      3. Albumin saline Negative      Grasses  4. Bahia Negative      5. French Southern Territories Negative      6. Johnson Negative      7. Kentucky Blue Negative      8. Meadow Fescue Negative      9. Perennial Rye Negative      10. Sweet Vernal Negative      11. Timothy Negative      Weeds  12. Cocklebur Negative      13. Burweed Marshelder Negative      14. Ragweed, short Negative      15. Ragweed, Giant Negative      16. Plantain,  English Negative      17. Lamb's  Quarters Negative      18. Sheep Sorrell Negative      19. Rough Pigweed Negative      20. Marsh Elder, Rough Negative      21. Mugwort, Common Negative      Trees  22. Ash mix Negative      23. Birch mix Negative      24. Beech American Negative      25. Box, Elder Negative      26. Cedar, red Negative      27. Cottonwood, Guinea-Bissau Negative      28. Elm mix Negative      29. Hickory Negative      30. Maple mix Negative      31. Oak, Guinea-Bissau mix Negative      32. Pecan Pollen Negative      33. Pine mix Negative      34. Sycamore Eastern Negative      35. Walnut, Black Pollen Negative      Major Molds Mix (seasonal) 1  36. Alternaria alternata Negative      37. Cladosporium Herbarum Negative      Major Molds Mix (perennial ) 2  38. Aspergillus mix Negative      39. Penicillium mix Negative      Minor Mold Mix (seasonal) 3  40. Bipolaris sorokiniana (Helminthosporium) Negative      41. Drechslera spicifera (Curvularia) Negative      42. Mucor plumbeus Negative      Minor Molds Mix (perennial ) 4  43. Fusarium moniliforme Negative      44. Aureobasidium pullulans  (pullulara) Negative      45. Rhizopus oryzae Negative      Other Molds  46. Botrytis cinera Negative      47. Epicoccum nigrum Negative      48. Phoma betae Negative      49. Candida Albicans Negative      50. Trichophyton mentagrophytes Negative      Inhalants  51. Mite, D Farinae  5,000 AU/ml Negative      52. Mite, D Pteronyssinus  5,000 AU/ml Negative      53. Cat Hair 10,000 BAU/ml Negative      54.  Dog Epithelia Negative      55. Mixed Feathers Negative      56. Horse Epithelia Negative      57. Cockroach, German Negative      58. Mouse Negative      59. Tobacco Leaf Negative        Food  1. Peanut Negative      2. Soybean food Negative      3. Wheat, whole Negative      4. Sesame Negative      5. Milk, cow Negative      6. Egg White, chicken Negative      7. Casein Negative      8. Shellfish mix Negative      9. Fish mix Negative      10. Cashew Negative         Intradermal - 08/03/21 0955     Time Antigen Placed 0981    Allergen Manufacturer Waynette Buttery    Location Arm    Number of Test 15    Intradermal Select    Control Negative    French Southern Territories Negative    Johnson Negative    7 Grass Negative    Ragweed mix Negative    Weed mix Negative    Tree  mix Negative    Mold 1 Negative    Mold 2 Negative    Mold 3 Negative    Mold 4 Negative    Cat Negative    Dog Negative    Cockroach 3+    Mite mix 2+             Allergy testing results were read and interpreted by myself, documented by clinical staff.         Malachi BondsJoel Broden Holt, MD [facility]

## 2021-08-04 ENCOUNTER — Encounter: Payer: Self-pay | Admitting: Allergy & Immunology

## 2021-08-04 DIAGNOSIS — J4540 Moderate persistent asthma, uncomplicated: Secondary | ICD-10-CM | POA: Insufficient documentation

## 2021-08-06 ENCOUNTER — Other Ambulatory Visit: Payer: Self-pay | Admitting: Primary Care

## 2021-08-06 DIAGNOSIS — F411 Generalized anxiety disorder: Secondary | ICD-10-CM

## 2021-08-07 ENCOUNTER — Encounter: Payer: Self-pay | Admitting: Allergy & Immunology

## 2021-08-07 ENCOUNTER — Other Ambulatory Visit: Payer: Self-pay | Admitting: Allergy & Immunology

## 2021-08-07 NOTE — Telephone Encounter (Signed)
fluticasone (FLOVENT HFA) 44 MCG/ACT inhaler Fluticasone Propionate, Inhal, (FLOVENT DISKUS) 50 MCG/ACT AEPB umeclidinium-vilanterol (ANORO ELLIPTA) 62.5-25 MCG/ACT AEPB   Please advise on alternative for Breztri inhaler.

## 2021-08-08 ENCOUNTER — Ambulatory Visit: Payer: Managed Care, Other (non HMO) | Admitting: Emergency Medicine

## 2021-08-09 ENCOUNTER — Other Ambulatory Visit: Payer: Self-pay | Admitting: Allergy & Immunology

## 2021-08-09 LAB — CBC WITH DIFFERENTIAL/PLATELET
Basophils Absolute: 0 10*3/uL (ref 0.0–0.2)
Basos: 0 %
EOS (ABSOLUTE): 0 10*3/uL (ref 0.0–0.4)
Eos: 1 %
Hematocrit: 42.9 % (ref 34.0–46.6)
Hemoglobin: 14.3 g/dL (ref 11.1–15.9)
Immature Grans (Abs): 0 10*3/uL (ref 0.0–0.1)
Immature Granulocytes: 0 %
Lymphocytes Absolute: 1.2 10*3/uL (ref 0.7–3.1)
Lymphs: 17 %
MCH: 29.2 pg (ref 26.6–33.0)
MCHC: 33.3 g/dL (ref 31.5–35.7)
MCV: 88 fL (ref 79–97)
Monocytes Absolute: 0.7 10*3/uL (ref 0.1–0.9)
Monocytes: 10 %
Neutrophils Absolute: 5.2 10*3/uL (ref 1.4–7.0)
Neutrophils: 72 %
Platelets: 227 10*3/uL (ref 150–450)
RBC: 4.89 x10E6/uL (ref 3.77–5.28)
RDW: 12.8 % (ref 11.7–15.4)
WBC: 7.2 10*3/uL (ref 3.4–10.8)

## 2021-08-09 LAB — ASPERGILLUS PRECIPITINS
A.Fumigatus #1 Abs: NEGATIVE
Aspergillus Flavus Antibodies: NEGATIVE
Aspergillus Niger Antibodies: NEGATIVE
Aspergillus glaucus IgG: NEGATIVE
Aspergillus nidulans IgG: NEGATIVE
Aspergillus terreus IgG: NEGATIVE

## 2021-08-09 LAB — ALPHA-1-ANTITRYPSIN: A-1 Antitrypsin: 116 mg/dL (ref 101–187)

## 2021-08-09 LAB — IGE: IgE (Immunoglobulin E), Serum: 20 IU/mL (ref 6–495)

## 2021-08-09 LAB — ANCA TITERS
Atypical pANCA: <1:20 {titer}
C-ANCA: <1:20 {titer}
P-ANCA: <1:20 {titer}

## 2021-08-09 NOTE — Telephone Encounter (Signed)
Please advise to change from trelegy to flovent per insurance

## 2021-08-09 NOTE — Telephone Encounter (Signed)
None of those alternatives are in the same class as Breztri. Insurance companies suck balls.   Can we send in Trelegy one puff once daily instead?   Malachi Bonds, MD [facility]

## 2021-08-09 NOTE — Telephone Encounter (Signed)
PA started for [facility]  Key: [id number] - PA Case ID: [id number]. Patient notified via [employer].

## 2021-08-11 NOTE — Telephone Encounter (Signed)
Her insurance company is completely insane. Flovent is in NO WAY equal to Trelegy. This is a stupid substitution. Maybe the overpaid Epimenio Sarin can learn some pharmacology and not recommend such dumb "alternatives" instead of denying coverage for medications that are needed to keep patients in good health.                                    Where are we with a Breztri approval? We gave her some samples yesterday.   Malachi Bonds, MD [facility]

## 2021-08-15 ENCOUNTER — Ambulatory Visit: Payer: Commercial Managed Care - HMO | Admitting: Allergy & Immunology

## 2021-08-21 ENCOUNTER — Ambulatory Visit
Admission: EM | Admit: 2021-08-21 | Discharge: 2021-08-21 | Disposition: A | Discharge status: Home / Self Care | Payer: Commercial Managed Care - HMO | Attending: Internal Medicine | Admitting: Internal Medicine

## 2021-08-21 DIAGNOSIS — J029 Acute pharyngitis, unspecified: Secondary | ICD-10-CM

## 2021-08-21 DIAGNOSIS — J069 Acute upper respiratory infection, unspecified: Secondary | ICD-10-CM | POA: Diagnosis present

## 2021-08-21 LAB — POCT RAPID STREP A (OFFICE): Rapid Strep A Screen: NEGATIVE

## 2021-08-21 MED ORDER — PREDNISONE 20 MG PO TABS: 40.0000 mg | ORAL_TABLET | Freq: Every day | ORAL | 0 refills | Status: AC

## 2021-08-21 MED ORDER — AMOXICILLIN-POT CLAVULANATE 875-125 MG PO TABS
1.0000 | ORAL_TABLET | Freq: Two times a day (BID) | ORAL | 0 refills | Status: DC
Start: 2021-08-21 — End: 2021-08-31

## 2021-08-23 ENCOUNTER — Encounter: Payer: Self-pay | Admitting: Allergy & Immunology

## 2021-08-23 LAB — CULTURE, GROUP A STREP (THRC)

## 2021-08-24 ENCOUNTER — Ambulatory Visit: Payer: Commercial Managed Care - HMO | Admitting: Allergy & Immunology

## 2021-08-29 ENCOUNTER — Encounter: Payer: Self-pay | Admitting: Allergy & Immunology

## 2021-08-31 ENCOUNTER — Ambulatory Visit (INDEPENDENT_AMBULATORY_CARE_PROVIDER_SITE_OTHER): Payer: Commercial Managed Care - HMO | Admitting: Family Medicine

## 2021-08-31 ENCOUNTER — Encounter: Payer: Self-pay | Admitting: Family Medicine

## 2021-08-31 ENCOUNTER — Ambulatory Visit
Admission: RE | Admit: 2021-08-31 | Discharge: 2021-08-31 | Disposition: A | Discharge status: Home / Self Care | Payer: Commercial Managed Care - HMO | Source: Ambulatory Visit | Attending: Family Medicine | Admitting: Family Medicine

## 2021-08-31 VITALS — BP 110/64 | HR 69 | Temp 98.8°F | Ht 69.0 in | Wt 224.6 lb

## 2021-08-31 DIAGNOSIS — M542 Cervicalgia: Secondary | ICD-10-CM

## 2021-08-31 DIAGNOSIS — J4540 Moderate persistent asthma, uncomplicated: Secondary | ICD-10-CM | POA: Diagnosis not present

## 2021-08-31 DIAGNOSIS — J301 Allergic rhinitis due to pollen: Secondary | ICD-10-CM | POA: Diagnosis not present

## 2021-08-31 NOTE — Progress Notes (Signed)
Patient ID: Hannah Klein, female    DOB: 1975/12/07, 46 y.o.   MRN: [id number]  This visit was conducted in person.  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 At that visit she reported 2 weeks of sore throat, nasal congestion and bilateral ear pain. Negative  rapid strep testing.  Throat culture negative given the duration of symptoms, she was treated with prednisone and Augmentin.    Today she reports persistent symptoms. She initially noted some improvement in stabbing pain in neck, but all symptoms a continuing and changed some.   Focal pain in left lower throat. Feels a knot in left lower throat with swallowing.  No trouble swallowing.  Has thick mucus in throat.  Feels like she needs to cough but nothing comes up.  Left jaw pain radiates to left ear.   Chest  sore with deep breathing.. burning x 1 week.   Of note she is followed for asthma by Dr. August 23, 2021, allergist she is using Breztri inhaler.  This a new med in the last week.  Using albuterol more frequently. She tried to contact  Dr. Dellis Anes but he is out of town.  She had a laryngoscopy with ENT several months ago.. for persistent asthma issue.  2/2023Chest CT unremarkable, stable nodules.  Relevant past  medical, surgical, family and social history reviewed and updated as indicated. Interim medical history since our last visit reviewed. Allergies and medications reviewed and updated. Outpatient Medications Prior to Visit  Medication Sig Dispense Refill   albuterol (VENTOLIN HFA) 108 (90 Base) MCG/ACT inhaler INHALE 1-2 PUFFS BY MOUTH EVERY 6 HOURS AS NEEDED FOR WHEEZE OR SHORTNESS OF BREATH 18 each 1   Budeson-Glycopyrrol-Formoterol (BREZTRI AEROSPHERE) 160-9-4.8 MCG/ACT AERO Inhale 2 puffs into the lungs in the morning and at bedtime. 1 each 5   Cholecalciferol (VITAMIN D3 PO) Take 1,000 Units by mouth.     esomeprazole (NEXIUM) 40 MG capsule TAKE 1 CAPSULE (40 MG TOTAL) BY MOUTH 2 (TWO) TIMES DAILY. FOR HEARTBURN. 180 capsule 2   famotidine (PEPCID) 40 MG tablet Take 1 tablet (40 mg total) by mouth 2 (two) times daily. 60 tablet 5   FIBER PO Take 1 tablet by mouth daily as needed.     hydrOXYzine (VISTARIL) 50 MG capsule TAKE 1 CAPSULE BY MOUTH ONCE OR TWICE DAILY AS NEEDED FOR ANXIETY 180 capsule 2   levocetirizine (XYZAL) 5 MG tablet Take 5 mg by mouth in the morning and at bedtime.     Multiple Vitamin (MULTIVITAMIN) capsule Take 1 capsule by mouth daily.  rosuvastatin (CRESTOR) 10 MG tablet Take 1 tablet (10 mg total) by mouth daily. for cholesterol. 90 tablet 3   vitamin B-12 (CYANOCOBALAMIN) 1000 MCG tablet Take 1,000 mcg by mouth daily.     vitamin C (ASCORBIC ACID) 500 MG tablet Take 500 mg by mouth daily.     vitamin E 100 UNIT capsule Take 400 Units by mouth daily.     amoxicillin-clavulanate (AUGMENTIN) 875-125 MG tablet Take 1 tablet by mouth every 12 (twelve) hours. 14 tablet 0   bacitracin-polymyxin b (POLYSPORIN) ophthalmic ointment Place 1 application into both eyes 2 (two) times daily. apply to eye every 12 hours while awake 3.5 g 0   fluticasone furoate-vilanterol (BREO ELLIPTA) 100-25 MCG/ACT AEPB Inhale 1 puff into the lungs daily. 60 each 6   levocetirizine (XYZAL) 5 MG  tablet TAKE 1 TABLET BY MOUTH EVERY DAY IN THE EVENING FOR ALLERGIES (Patient taking differently: Take 5 mg by mouth in the morning and at bedtime.) 90 tablet 3   montelukast (SINGULAIR) 10 MG tablet Take 1 tablet (10 mg total) by mouth at bedtime. For allergies. Office visit required for further refills. 30 tablet 0   No facility-administered medications prior to visit.     Per HPI unless specifically indicated in ROS section below Review of Systems  Constitutional:  Negative for fatigue and fever.  HENT:  Negative for congestion, postnasal drip, rhinorrhea, sinus pain and sore throat.   Eyes:  Negative for pain.  Respiratory:  Negative for cough and shortness of breath.   Cardiovascular:  Negative for chest pain, palpitations and leg swelling.  Gastrointestinal:  Negative for abdominal pain.  Genitourinary:  Negative for dysuria and vaginal bleeding.  Musculoskeletal:  Negative for back pain.  Neurological:  Negative for syncope, light-headedness and headaches.  Psychiatric/Behavioral:  Negative for dysphoric mood.    Objective:  BP 110/64   Pulse 69   Temp 98.8 F (37.1 C) (Oral)   Ht 5\' 9"  (1.753 m)   Wt 224 lb 9 oz (101.9 kg)   LMP  (LMP Unknown)   SpO2 95%   BMI 33.16 kg/m   Wt Readings from Last 3 Encounters:  08/31/21 224 lb 9 oz (101.9 kg)  08/03/21 227 lb 2 oz (103 kg)  06/09/21 229 lb (103.9 kg)      Physical Exam Constitutional:      General: She is not in acute distress.    Appearance: Normal appearance. She is well-developed. She is not ill-appearing or toxic-appearing.  HENT:     Head: Normocephalic.     Right Ear: Hearing, tympanic membrane, ear canal and external ear normal. Tympanic membrane is not erythematous, retracted or bulging.     Left Ear: Hearing, tympanic membrane, ear canal and external ear normal. Tympanic membrane is not erythematous, retracted or bulging.     Nose: No mucosal edema or rhinorrhea.     Right Sinus: No maxillary sinus  tenderness or frontal sinus tenderness.     Left Sinus: No maxillary sinus tenderness or frontal sinus tenderness.     Mouth/Throat:     Pharynx: Uvula midline.  Eyes:     General: Lids are normal. Lids are everted, no foreign bodies appreciated.     Conjunctiva/sclera: Conjunctivae normal.     Pupils: Pupils are equal, round, and reactive to light.  Neck:     Thyroid: No thyroid mass or thyromegaly.     Vascular: No carotid bruit.     Trachea: Trachea normal.  Comments: Possible mild swelling in left lower neck at area of focal pinpoint pain, possibly mild thyroid enlargement Cardiovascular:     Rate and Rhythm: Normal rate and regular rhythm.     Pulses: Normal pulses.     Heart sounds: Normal heart sounds, S1 normal and S2 normal. No murmur heard.    No friction rub. No gallop.  Pulmonary:     Effort: Pulmonary effort is normal. No tachypnea or respiratory distress.     Breath sounds: Normal breath sounds. No decreased breath sounds, wheezing, rhonchi or rales.  Abdominal:     General: Bowel sounds are normal.     Palpations: Abdomen is soft.     Tenderness: There is no abdominal tenderness.  Musculoskeletal:     Cervical back: Normal range of motion and neck supple.  Skin:    General: Skin is warm and dry.     Findings: No rash.  Neurological:     Mental Status: She is alert.  Psychiatric:        Mood and Affect: Mood is not anxious or depressed.        Speech: Speech normal.        Behavior: Behavior normal. Behavior is cooperative.        Thought Content: Thought content normal.        Judgment: Judgment normal.       Results for orders placed or performed during the hospital encounter of 08/21/21  Culture, group A strep   Specimen: Throat  Result Value Ref Range   Specimen Description THROAT    Special Requests NONE    Culture      NO GROUP A STREP (S.PYOGENES) ISOLATED Performed at [facility], [address] [place], [place] [address]    Report  Status 08/23/2021 FINAL   POCT rapid strep A  Result Value Ref Range   Rapid Strep A Screen Negative Negative     COVID 19 screen:  No recent travel or known exposure to COVID19 The patient denies respiratory symptoms of COVID 19 at this time. The importance of social distancing was discussed today.   Assessment and Plan Problem List Items Addressed This Visit     Allergic rhinitis due to pollen    Chronic with acute worsening  Of her symptoms do seem to be allergy related.  I encouraged her to restart nasal steroid spray but to use a lower dose and not as long to avoid any nasal irritation.  She will also restart nasal saline irrigation or spray.  She can use Mucinex to break up mucus as needed.      Moderate persistent asthma, uncomplicated    Chronic, moderate control  She feels like she has noted minimal improvement with new Breztri inhaler.  This potentially could be causing some of her throat side effects but the symptoms do not seem to be temporally associated with the start of this medication She will follow-up with her asthma allergy doctor as needed      Neck pain - Primary    Acute, persistent now for 3 to 4 weeks.  No clear sign of infection at this point.  Negative rapid strep and strep culture. Her focal pinpoint pain is more concerning for thyroid nodule, thyroid enlargement or neck mass.  We will begin evaluation with a thyroid ultrasound.  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  Of note she did  have a normal laryngoscopy per her report at that visit.       Relevant Orders   US THYROID       Kerby Nora, MD

## 2021-08-31 NOTE — Patient Instructions (Addendum)
Start nasal steroid spray  1-2 sprays per nostril daily for short-term given past SE.  Start nasals saline spray  1-2 daily.  Could try  mucinex as needed for   We will move forward with thyroid US.

## 2021-08-31 NOTE — Assessment & Plan Note (Signed)
Acute, persistent now for 3 to 4 weeks.  No clear sign of infection at this point.  Negative rapid strep and strep culture. Her focal pinpoint pain is more concerning for thyroid nodule, thyroid enlargement or neck mass.  We will begin evaluation with a thyroid ultrasound.  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  Of note she did have a normal laryngoscopy per her report at that visit.

## 2021-08-31 NOTE — Assessment & Plan Note (Signed)
Chronic, moderate control  She feels like she has noted minimal improvement with new Breztri inhaler.  This potentially could be causing some of her throat side effects but the symptoms do not seem to be temporally associated with the start of this medication She will follow-up with her asthma allergy doctor as needed

## 2021-08-31 NOTE — Assessment & Plan Note (Signed)
Chronic with acute worsening  Of her symptoms do seem to be allergy related.  I encouraged her to restart nasal steroid spray but to use a lower dose and not as long to avoid any nasal irritation.  She will also restart nasal saline irrigation or spray.  She can use Mucinex to break up mucus as needed.

## 2021-09-12 ENCOUNTER — Other Ambulatory Visit: Payer: Self-pay | Admitting: Primary Care

## 2021-09-12 ENCOUNTER — Ambulatory Visit
Admission: RE | Admit: 2021-09-12 | Discharge: 2021-09-12 | Disposition: A | Discharge status: Home / Self Care | Payer: Commercial Managed Care - HMO | Source: Ambulatory Visit | Attending: Family Medicine | Admitting: Family Medicine

## 2021-09-12 DIAGNOSIS — M542 Cervicalgia: Secondary | ICD-10-CM

## 2021-09-12 DIAGNOSIS — K219 Gastro-esophageal reflux disease without esophagitis: Secondary | ICD-10-CM

## 2021-09-12 MED ORDER — IOPAMIDOL (ISOVUE-370) INJECTION 76%
75.0000 mL | Freq: Once | INTRAVENOUS | Status: AC | PRN
  Administered 2021-09-12: 75 mL via INTRAVENOUS

## 2021-09-21 ENCOUNTER — Encounter: Payer: Self-pay | Admitting: Allergy & Immunology

## 2021-09-21 ENCOUNTER — Ambulatory Visit: Payer: Commercial Managed Care - HMO

## 2021-09-21 ENCOUNTER — Ambulatory Visit: Payer: Commercial Managed Care - HMO | Admitting: Allergy & Immunology

## 2021-09-21 VITALS — BP 122/84 | HR 70 | Temp 98.5°F | Resp 16 | Ht 68.0 in | Wt 222.8 lb

## 2021-09-21 DIAGNOSIS — J4550 Severe persistent asthma, uncomplicated: Secondary | ICD-10-CM | POA: Diagnosis not present

## 2021-09-21 DIAGNOSIS — J383 Other diseases of vocal cords: Secondary | ICD-10-CM

## 2021-09-21 DIAGNOSIS — K219 Gastro-esophageal reflux disease without esophagitis: Secondary | ICD-10-CM | POA: Diagnosis not present

## 2021-09-21 DIAGNOSIS — J4540 Moderate persistent asthma, uncomplicated: Secondary | ICD-10-CM

## 2021-09-21 DIAGNOSIS — J3089 Other allergic rhinitis: Secondary | ICD-10-CM | POA: Diagnosis not present

## 2021-09-21 MED ORDER — LEVOCETIRIZINE DIHYDROCHLORIDE 5 MG PO TABS: 5.0000 mg | ORAL_TABLET | Freq: Two times a day (BID) | ORAL | 5 refills | Status: DC

## 2021-09-21 MED ORDER — TEZEPELUMAB-EKKO 210 MG/1.91ML ~~LOC~~ SOSY
210.0000 mg | PREFILLED_SYRINGE | SUBCUTANEOUS | Status: DC
  Administered 2021-09-21 – 2022-01-10 (×5): 210 mg via SUBCUTANEOUS

## 2021-09-21 NOTE — Progress Notes (Signed)
Immunotherapy   Patient Details  Name: JANAZIA SCHREIER MRN: [id number] Date of Birth: 02-Jan-1976  09/21/2021  [name] started injections for  Tezspire  Frequency: Every four weeks Epi-Pen: Not needed Consent signed and patient instructions given. Patient waited in the back while she was doing her office visit with Dr. Dellis Anes. Patient had no issues during her wait time.   Ralene Muskrat 09/21/2021, 10:32 AM

## 2021-09-21 NOTE — Patient Instructions (Addendum)
1. Moderate persistent asthma, uncomplicated - Let's stay off of all inhalers for now.  - I think that you need to see an ENT for evaluation of vocal cord dysfunction.  - I am not convinced that this is asthma at all since the albuterol does not work for long and none of the controllers are helping at all.  - We are going to refer you to see Dr, Lisette Abu at [facility] and then you can ask whether they take your insurance.  - Daily controller medication(s): NOTHING + Tezspire  - Prior to physical activity: albuterol 2 puffs 10-15 minutes before physical activity. - Rescue medications: albuterol 4 puffs every 4-6 hours as needed - Asthma control goals:  * Full participation in all desired activities (may need albuterol before activity) * Albuterol use two time or less a week on average (not counting use with activity) * Cough interfering with sleep two time or less a month * Oral steroids no more than once a year * No hospitalizations  2. Chronic rhinitis - Previous testing today showed: dust mites and cockroach. - Continue with: Xyzal (levocetirizine) 5mg  tablet twice daily  - I like the idea of the Navage.   3. Gastroesophageal reflux disease - Continue with Nexium twice daily. - Stop the Pepcid since you did not feel that it helped.   4. Return in about 4 months (around 01/22/2022).    Please inform 01/24/2022 of any Emergency Department visits, hospitalizations, or changes in symptoms. Call us before going to the ED for breathing or allergy symptoms since we might be able to fit you in for a sick visit. Feel free to contact us anytime with any questions, problems, or concerns.  It was a pleasure to meet you today!  Websites that have reliable patient information: 1. American Academy of Asthma, Allergy, and Immunology: www.aaaai.org 2. Food Allergy Research and Education (FARE): foodallergy.org 3. Mothers of Asthmatics: http://www.asthmacommunitynetwork.org 4. American  College of Allergy, Asthma, and Immunology: www.acaai.org   COVID-19 Vaccine Information can be found at: [name] For questions related to vaccine distribution or appointments, please email [email or web address] or call [phone].   We realize that you might be concerned about having an allergic reaction to the COVID19 vaccines. To help with that concern, WE ARE OFFERING THE COVID19 VACCINES IN OUR OFFICE! Ask the front desk for dates!     "Like" [phone] on Facebook and Instagram for our latest updates!      A healthy democracy works best when [name] participate! Make sure you are registered to vote! If you have moved or changed any of your contact information, you will need to get this updated before voting!  In some cases, you MAY be able to register to vote online: [employer]

## 2021-09-21 NOTE — Progress Notes (Signed)
FOLLOW UP  Date of Service/Encounter:  09/21/21   Assessment:   Moderate persistent asthma, uncomplicated   Chronic rhinitis (dust mites and cockroach)   Gastroesophageal reflux disease    Normal EKG in 2019 and normal echocardiogram in 2022  OSA - previously on CPAP (but has not used it since January 2023   Pain in right side of the throat - negative neck CT and ultrasound  Plan/Recommendations:   1. Moderate persistent asthma, uncomplicated - Let's stay off of all inhalers for now.  - I think that you need to see an ENT for evaluation of vocal cord dysfunction.  - I am not convinced that this is asthma at all since the albuterol does not work for long and none of the controllers are helping at all.  - We are going to refer you to see Dr, Hannah Klein at [facility] and then you can ask whether they take your insurance.  - Daily controller medication(s): NOTHING + Tezspire  - Prior to physical activity: albuterol 2 puffs 10-15 minutes before physical activity. - Rescue medications: albuterol 4 puffs every 4-6 hours as needed - Asthma control goals:  * Full participation in all desired activities (may need albuterol before activity) * Albuterol use two time or less a week on average (not counting use with activity) * Cough interfering with sleep two time or less a month * Oral steroids no more than once a year * No hospitalizations  2. Chronic rhinitis - Previous testing today showed: dust mites and cockroach. - Continue with: Xyzal (levocetirizine) 5mg  tablet twice daily  - I like the idea of the Navage.   3. Gastroesophageal reflux disease - Continue with Nexium twice daily. - Stop the Pepcid since you did not feel that it helped.   4. Return in about 4 months (around 01/22/2022).   Subjective:   Hannah Klein is a 46 y.o. female presenting today for follow up of  Chief Complaint  Patient presents with   Asthma    F/U - Patient states she  doesn't have a"sore throat"  - her pain is left side parathyroid gland and radiates upwards into her left jaw and left ear - PCP did ultrasound of thyroid, CT Neck w/contrast f neck - ALL Negative  Patient states she stopped Breztri on 09/04/21 - Sxs - chest burning  - w/in a week Sxs resolved.    Hannah Klein has a history of the following: Patient Active Problem List   Diagnosis Date Noted   Neck pain 08/31/2021   Allergic rhinitis due to pollen 08/31/2021   Moderate persistent asthma, uncomplicated [id number]   Vitamin D deficiency 06/09/2021   Acute recurrent frontal sinusitis 04/28/2021   BPV (benign positional vertigo), right 04/28/2021   Right elbow pain 04/28/2021   Right tennis elbow 04/28/2021   Upper airway cough syndrome 09/28/2020   Persistent cough for 3 weeks or longer 08/26/2020   Pulmonary nodule 04/29/2020   Dyspnea 04/04/2020   Chronic rhinitis 04/04/2020   OSA (obstructive sleep apnea) 04/04/2020   Essential hypertension 11/11/2017   Hyperlipidemia 11/29/2016   Encounter for annual general medical examination with abnormal findings in adult 11/23/2015   Constipation 11/23/2015   Generalized anxiety disorder 04/26/2015   Right-sided low back pain with right-sided sciatica 04/26/2015   Asthma 04/26/2015   Frequent headaches 04/26/2015   Osteoarthritis 04/26/2015   GERD (gastroesophageal reflux disease) 04/26/2015    History obtained from: chart review and patient.  Hannah Klein is  a 46 y.o. female presenting for a follow up visit.  She was last seen as a new patient in June 2023.  At that time, her lung testing was in the 60% range and improved only slightly with the albuterol treatment.  She was doing very well with Symbicort, but I think she had problems with affording that.  We changed her to [place] instead.  We also obtain some labs to see if she will qualify for an injectable asthma medication.  She underwent allergy testing that was positive to dust  mites and cockroach.  We stopped her montelukast.  We continued Xyzal but increase to twice daily and added Xhance.  GERD was not under good control.  We continue with Nexium twice daily and added Pepcid twice daily as well.  In the interim, she has called multiple times due to coverage of medications (or lack thereof rather) and side effects to medications.  Most recently, she contacted the office in early July to let us know that she stopped using her breast tree.  She reported a lot of burning in her chest when she breathes as well as a spot on the left side of her jaw throat and ear that were hurting.  She apparently went to urgent care to get antibiotics that did not do any good.  Since the last visit, she has not done well.   Asthma/Respiratory Symptom History: She was having burning in her chest with the [facility]. This resolved within one week of stopping the [place]. She no longer has to have the deep yawning - this resolved when she stopped the [facility].   She does see Dr Hannah Klein. She has not been back to see him. Albuterol does help but only for 30 minutes. She did start her Tezspire in hopes that this helps.   She is not using her CPAP and has not restarted it since January 2023. She does want to start it. She did see an ENT in the last year, but it was out of network. She apparently does not have an ENT that takes her insurance. She has not made any appointments with other ENTs. She has never seen a vocal cord specialist.   She had an echocardiogram in April 2022 that was normal. This was ordered by Dr. Delton Klein.     Allergic Rhinitis Symptom History: She reports that she is still having some pain in her left side of the throat and extended into her ears. This was early July. She ended up having an ultrasound of her thyroid that was normal. She had a CT scan of her neck that was normal. She is still having the pain.  GERD Symptom History: The addition of the famotidine did not seem to do anything  at all.  She remains on the Nexium and Pepcid.  Otherwise, there have been no changes to her past medical history, surgical history, family history, or social history.    Review of Systems  Constitutional: Negative.  Negative for chills, fever, malaise/fatigue and weight loss.  HENT: Negative.  Negative for congestion, ear discharge and ear pain.   Eyes:  Negative for pain, discharge and redness.  Respiratory:  Negative for cough, sputum production, shortness of breath and wheezing.   Cardiovascular: Negative.  Negative for chest pain and palpitations.  Gastrointestinal:  Negative for abdominal pain, constipation, diarrhea, heartburn, nausea and vomiting.  Skin: Negative.  Negative for itching and rash.  Neurological:  Negative for dizziness and headaches.  Endo/Heme/Allergies:  Negative for environmental  allergies. Does not bruise/bleed easily.       Objective:   Blood pressure 122/84, pulse 70, temperature 98.5 F (36.9 C), resp. rate 16, height 5\' 8"  (1.727 m), weight 222 lb 12.8 oz (101.1 kg), SpO2 98 %. Body mass index is 33.88 kg/m.    Physical Exam Vitals reviewed.  Constitutional:      Appearance: She is well-developed.     Comments: Very pleasant.   HENT:     Head: Normocephalic and atraumatic.     Right Ear: Tympanic membrane, ear canal and external ear normal. No drainage, swelling or tenderness. Tympanic membrane is not injected, scarred, erythematous, retracted or bulging.     Left Ear: Tympanic membrane, ear canal and external ear normal. No drainage, swelling or tenderness. Tympanic membrane is not injected, scarred, erythematous, retracted or bulging.     Nose: No nasal deformity, septal deviation, mucosal edema or rhinorrhea.     Right Turbinates: Enlarged, swollen and pale.     Left Turbinates: Enlarged, swollen and pale.     Right Sinus: No maxillary sinus tenderness or frontal sinus tenderness.     Left Sinus: No maxillary sinus tenderness or frontal  sinus tenderness.     Comments: Turbinates erythematous.    Mouth/Throat:     Lips: Pink.     Mouth: Mucous membranes are moist. Mucous membranes are not pale and not dry.     Pharynx: Uvula midline.     Comments: Cobblestoning present in the posterior oropharynx. Eyes:     General: Lids are normal. Allergic shiner present.        Right eye: No discharge.        Left eye: No discharge.     Conjunctiva/sclera: Conjunctivae normal.     Right eye: Right conjunctiva is not injected. No chemosis.    Left eye: Left conjunctiva is not injected. No chemosis.    Pupils: Pupils are equal, round, and reactive to light.  Cardiovascular:     Rate and Rhythm: Normal rate and regular rhythm.     Heart sounds: Normal heart sounds.  Pulmonary:     Effort: Pulmonary effort is normal. No accessory muscle usage or respiratory distress.     Breath sounds: Normal breath sounds. No wheezing, rhonchi or rales.     Comments: Decreased air movement at the bases. Intermittently tachypneic. No crackles or wheezes noted.  Chest:     Chest wall: No tenderness.  Abdominal:     Tenderness: There is no abdominal tenderness. There is no guarding or rebound.  Lymphadenopathy:     Head:     Right side of head: No submandibular, tonsillar or occipital adenopathy.     Left side of head: No submandibular, tonsillar or occipital adenopathy.     Cervical: No cervical adenopathy.  Skin:    Coloration: Skin is not pale.     Findings: No abrasion, erythema, petechiae or rash. Rash is not papular, urticarial or vesicular.  Neurological:     Mental Status: She is alert.  Psychiatric:        Behavior: Behavior is cooperative.      Diagnostic studies: none       , MD  [facility]

## 2021-09-28 NOTE — Progress Notes (Signed)
Called Dr. Allyson Sabal Madden's to place referral (as it is required to do so over the phone).   Was told by their office that patient was seen today 09-28-2021 by Dr. Rubye Oaks and has an appointment with the vocal cord specialist 10-04-2021. Their office requested I fax over notes from last visit. Notes have been faxed to [phone]

## 2021-09-29 ENCOUNTER — Ambulatory Visit: Payer: Commercial Managed Care - HMO | Admitting: Primary Care

## 2021-10-18 ENCOUNTER — Ambulatory Visit (INDEPENDENT_AMBULATORY_CARE_PROVIDER_SITE_OTHER): Payer: Commercial Managed Care - HMO

## 2021-10-18 DIAGNOSIS — J4540 Moderate persistent asthma, uncomplicated: Secondary | ICD-10-CM | POA: Diagnosis not present

## 2021-10-18 MED ORDER — EPINEPHRINE 0.3 MG/0.3ML IJ SOAJ: 0.3000 mg | Freq: Once | INTRAMUSCULAR | 2 refills | Status: AC

## 2021-10-31 ENCOUNTER — Other Ambulatory Visit: Payer: Self-pay | Admitting: Allergy & Immunology

## 2021-11-15 ENCOUNTER — Ambulatory Visit (INDEPENDENT_AMBULATORY_CARE_PROVIDER_SITE_OTHER): Payer: Commercial Managed Care - HMO | Admitting: *Deleted

## 2021-11-15 ENCOUNTER — Ambulatory Visit: Payer: Commercial Managed Care - HMO

## 2021-11-15 DIAGNOSIS — J4540 Moderate persistent asthma, uncomplicated: Secondary | ICD-10-CM

## 2021-12-13 ENCOUNTER — Ambulatory Visit (INDEPENDENT_AMBULATORY_CARE_PROVIDER_SITE_OTHER): Payer: Commercial Managed Care - HMO | Admitting: *Deleted

## 2021-12-13 DIAGNOSIS — J4550 Severe persistent asthma, uncomplicated: Secondary | ICD-10-CM

## 2021-12-13 DIAGNOSIS — J4540 Moderate persistent asthma, uncomplicated: Secondary | ICD-10-CM

## 2022-01-03 ENCOUNTER — Encounter: Payer: Self-pay | Admitting: Internal Medicine

## 2022-01-10 ENCOUNTER — Ambulatory Visit (INDEPENDENT_AMBULATORY_CARE_PROVIDER_SITE_OTHER): Payer: Commercial Managed Care - HMO | Admitting: *Deleted

## 2022-01-10 DIAGNOSIS — J4540 Moderate persistent asthma, uncomplicated: Secondary | ICD-10-CM | POA: Diagnosis not present

## 2022-01-20 ENCOUNTER — Other Ambulatory Visit: Payer: Self-pay | Admitting: Allergy & Immunology

## 2022-01-24 ENCOUNTER — Other Ambulatory Visit: Payer: Self-pay | Admitting: Allergy & Immunology

## 2022-01-25 ENCOUNTER — Telehealth: Payer: Self-pay | Admitting: Allergy & Immunology

## 2022-01-25 ENCOUNTER — Encounter (HOSPITAL_BASED_OUTPATIENT_CLINIC_OR_DEPARTMENT_OTHER): Payer: Self-pay

## 2022-01-25 ENCOUNTER — Observation Stay (HOSPITAL_BASED_OUTPATIENT_CLINIC_OR_DEPARTMENT_OTHER)
Admission: EM | Admit: 2022-01-25 | Discharge: 2022-01-26 | Disposition: A | Discharge status: Home / Self Care | Payer: Commercial Managed Care - HMO | Attending: Family Medicine | Admitting: Family Medicine

## 2022-01-25 ENCOUNTER — Other Ambulatory Visit: Payer: Self-pay | Admitting: Allergy & Immunology

## 2022-01-25 ENCOUNTER — Other Ambulatory Visit: Payer: Self-pay

## 2022-01-25 ENCOUNTER — Ambulatory Visit (INDEPENDENT_AMBULATORY_CARE_PROVIDER_SITE_OTHER): Payer: Commercial Managed Care - HMO | Admitting: Allergy & Immunology

## 2022-01-25 ENCOUNTER — Encounter: Payer: Self-pay | Admitting: Allergy & Immunology

## 2022-01-25 ENCOUNTER — Emergency Department (HOSPITAL_BASED_OUTPATIENT_CLINIC_OR_DEPARTMENT_OTHER): Payer: Commercial Managed Care - HMO | Admitting: Radiology

## 2022-01-25 ENCOUNTER — Emergency Department (HOSPITAL_BASED_OUTPATIENT_CLINIC_OR_DEPARTMENT_OTHER): Payer: Commercial Managed Care - HMO

## 2022-01-25 ENCOUNTER — Encounter (HOSPITAL_COMMUNITY): Payer: Self-pay

## 2022-01-25 VITALS — BP 118/76 | HR 69 | Temp 98.9°F | Resp 20 | Ht 69.0 in | Wt 222.0 lb

## 2022-01-25 DIAGNOSIS — E669 Obesity, unspecified: Secondary | ICD-10-CM | POA: Diagnosis not present

## 2022-01-25 DIAGNOSIS — J45909 Unspecified asthma, uncomplicated: Secondary | ICD-10-CM | POA: Diagnosis not present

## 2022-01-25 DIAGNOSIS — J4540 Moderate persistent asthma, uncomplicated: Secondary | ICD-10-CM

## 2022-01-25 DIAGNOSIS — R0600 Dyspnea, unspecified: Secondary | ICD-10-CM

## 2022-01-25 DIAGNOSIS — K219 Gastro-esophageal reflux disease without esophagitis: Secondary | ICD-10-CM | POA: Insufficient documentation

## 2022-01-25 DIAGNOSIS — K3184 Gastroparesis: Secondary | ICD-10-CM | POA: Diagnosis not present

## 2022-01-25 DIAGNOSIS — R1310 Dysphagia, unspecified: Secondary | ICD-10-CM | POA: Insufficient documentation

## 2022-01-25 DIAGNOSIS — R7989 Other specified abnormal findings of blood chemistry: Principal | ICD-10-CM

## 2022-01-25 DIAGNOSIS — Z6831 Body mass index (BMI) 31.0-31.9, adult: Secondary | ICD-10-CM | POA: Diagnosis not present

## 2022-01-25 DIAGNOSIS — J984 Other disorders of lung: Secondary | ICD-10-CM

## 2022-01-25 DIAGNOSIS — Z87891 Personal history of nicotine dependence: Secondary | ICD-10-CM | POA: Insufficient documentation

## 2022-01-25 DIAGNOSIS — R059 Cough, unspecified: Secondary | ICD-10-CM | POA: Diagnosis present

## 2022-01-25 DIAGNOSIS — J189 Pneumonia, unspecified organism: Secondary | ICD-10-CM | POA: Diagnosis not present

## 2022-01-25 DIAGNOSIS — K449 Diaphragmatic hernia without obstruction or gangrene: Secondary | ICD-10-CM | POA: Insufficient documentation

## 2022-01-25 DIAGNOSIS — Z1152 Encounter for screening for COVID-19: Secondary | ICD-10-CM | POA: Insufficient documentation

## 2022-01-25 DIAGNOSIS — R0602 Shortness of breath: Secondary | ICD-10-CM

## 2022-01-25 DIAGNOSIS — R918 Other nonspecific abnormal finding of lung field: Secondary | ICD-10-CM

## 2022-01-25 LAB — COMPREHENSIVE METABOLIC PANEL
ALT: 18 U/L (ref 0–44)
AST: 32 U/L (ref 15–41)
Albumin: 4.6 g/dL (ref 3.5–5.0)
Alkaline Phosphatase: 68 U/L (ref 38–126)
Anion gap: 9 (ref 5–15)
BUN: 17 mg/dL (ref 6–20)
CO2: 26 mmol/L (ref 22–32)
Calcium: 9.7 mg/dL (ref 8.9–10.3)
Chloride: 100 mmol/L (ref 98–111)
Creatinine, Ser: 0.79 mg/dL (ref 0.44–1.00)
GFR, Estimated: >60 mL/min (ref >60)
Glucose, Bld: 88 mg/dL (ref 70–99)
Potassium: 3.9 mmol/L (ref 3.5–5.1)
Sodium: 135 mmol/L (ref 135–145)
Total Bilirubin: 0.8 mg/dL (ref 0.3–1.2)
Total Protein: 8.4 g/dL — ABNORMAL HIGH (ref 6.5–8.1)

## 2022-01-25 LAB — CBC
HCT: 40.3 % (ref 36.0–46.0)
Hemoglobin: 13.8 g/dL (ref 12.0–15.0)
MCH: 29 pg (ref 26.0–34.0)
MCHC: 34.2 g/dL (ref 30.0–36.0)
MCV: 84.7 fL (ref 80.0–100.0)
Platelets: 235 10*3/uL (ref 150–400)
RBC: 4.76 MIL/uL (ref 3.87–5.11)
RDW: 13.2 % (ref 11.5–15.5)
WBC: 8.7 10*3/uL (ref 4.0–10.5)
nRBC: 0 % (ref 0.0–0.2)

## 2022-01-25 LAB — TROPONIN I (HIGH SENSITIVITY)
Troponin I (High Sensitivity): 61 ng/L — ABNORMAL HIGH (ref <18)
Troponin I (High Sensitivity): 42 ng/L — ABNORMAL HIGH (ref <18)

## 2022-01-25 LAB — BRAIN NATRIURETIC PEPTIDE: B Natriuretic Peptide: 19.1 pg/mL (ref 0.0–100.0)

## 2022-01-25 LAB — D-DIMER, QUANTITATIVE: D-DIMER: 1.64 mg/L FEU — ABNORMAL HIGH (ref 0.00–0.49)

## 2022-01-25 LAB — HCG, SERUM, QUALITATIVE: Preg, Serum: NEGATIVE

## 2022-01-25 MED ORDER — FORMOTEROL FUMARATE 20 MCG/2ML IN NEBU: 20.0000 ug | INHALATION_SOLUTION | Freq: Two times a day (BID) | RESPIRATORY_TRACT | 1 refills | Status: DC

## 2022-01-25 MED ORDER — LEVOCETIRIZINE DIHYDROCHLORIDE 5 MG PO TABS: 5.0000 mg | ORAL_TABLET | Freq: Two times a day (BID) | ORAL | 1 refills | Status: DC

## 2022-01-25 MED ORDER — SODIUM CHLORIDE 0.9 % IV SOLN
2.0000 g | Freq: Once | INTRAVENOUS | Status: AC
  Administered 2022-01-25: 2 g via INTRAVENOUS
  Filled 2022-01-25: qty 20

## 2022-01-25 MED ORDER — SUCRALFATE 1 G PO TABS: 1.0000 g | ORAL_TABLET | Freq: Three times a day (TID) | ORAL | 5 refills | Status: DC

## 2022-01-25 MED ORDER — ONDANSETRON HCL 4 MG/2ML IJ SOLN: 4.0000 mg | Freq: Once | INTRAMUSCULAR | Status: DC

## 2022-01-25 MED ORDER — METOCLOPRAMIDE HCL 5 MG PO TABS
5.0000 mg | ORAL_TABLET | Freq: Four times a day (QID) | ORAL | 1 refills | Status: DC
Start: 2022-01-25 — End: 2022-03-02

## 2022-01-25 MED ORDER — SODIUM CHLORIDE 0.9 % IV SOLN
500.0000 mg | Freq: Once | INTRAVENOUS | Status: AC
  Administered 2022-01-25: 500 mg via INTRAVENOUS
  Filled 2022-01-25: qty 5

## 2022-01-25 MED ORDER — CLOBETASOL PROPIONATE 0.05 % EX OINT: 1.0000 | TOPICAL_OINTMENT | Freq: Two times a day (BID) | CUTANEOUS | 0 refills | Status: DC

## 2022-01-25 MED ORDER — IOHEXOL 350 MG/ML SOLN
100.0000 mL | Freq: Once | INTRAVENOUS | Status: AC | PRN
  Administered 2022-01-25: 75 mL via INTRAVENOUS

## 2022-01-25 NOTE — Telephone Encounter (Signed)
I called the patient to let her know about her elevated D-dimer.  I recommended that she go to the emergency room for a stat chest CT to rule out a pulmonary embolism.  I recommended that she go to the med center at [facility] because that ER tends to be a slightly lower wait time.  She is going to plan to that.  I did tell her that she might be admitted. She was thankful for the call.  Malachi Bonds, MD [facility]

## 2022-01-25 NOTE — Patient Instructions (Addendum)
1. Moderate persistent asthma, uncomplicated - Lung testing looks lower today. - I do not think the Hannah Klein is helping at all.  - We are going to get another chest CT to see where things are progressing.  - We are going to get some labs to look for autoimmune issues and rule out a pulmonary embolism.  - I am going to talk to Dr. Delton Coombes about next steps.  - Nebulizer machine and demonstration provided.  - Daily controller medication(s): Perforomist twice daily via nebulizer - Prior to physical activity: albuterol 2 puffs 10-15 minutes before physical activity. - Rescue medications: albuterol 4 puffs every 4-6 hours as needed - Asthma control goals:  * Full participation in all desired activities (may need albuterol before activity) * Albuterol use two time or less a week on average (not counting use with activity) * Cough interfering with sleep two time or less a month * Oral steroids no more than once a year * No hospitalizations  2. Chronic rhinitis - Previous testing today showed: dust mites and cockroach. - Continue with: Xyzal (levocetirizine) 5mg  tablet twice daily   3. Gastroesophageal reflux disease - Continue with Nexium twice daily. - Continue with famotidine 40mg  twice daily. - Add on Carafate three times daily (this coats the stomach to help with reflux symptoms). - Add on Reglan four times daily (pro-motility agent).   4. Return in about 3 months (around 04/26/2022).    Please inform of any Emergency Department visits, hospitalizations, or changes in symptoms. Call 04/28/2022 before going to the ED for breathing or allergy symptoms since we might be able to fit you in for a sick visit. Feel free to contact us anytime with any questions, problems, or concerns.  It was a pleasure to meet you today!  Websites that have reliable patient information: 1. American Academy of Asthma, Allergy, and Immunology: www.aaaai.org 2. Food Allergy Research and Education (FARE):  foodallergy.org 3. Mothers of Asthmatics: http://www.asthmacommunitynetwork.org 4. American College of Allergy, Asthma, and Immunology: www.acaai.org   COVID-19 Vaccine Information can be found at: [name] For questions related to vaccine distribution or appointments, please email [email or web address] or call [phone].   We realize that you might be concerned about having an allergic reaction to the COVID19 vaccines. To help with that concern, WE ARE OFFERING THE COVID19 VACCINES IN OUR OFFICE! Ask the front desk for dates!     "Like" [email or web address] on Facebook and Instagram for our latest updates!      A healthy democracy works best when [phone] participate! Make sure you are registered to vote! If you have moved or changed any of your contact information, you will need to get this updated before voting!  In some cases, you MAY be able to register to vote online: [name]

## 2022-01-25 NOTE — Telephone Encounter (Signed)
Spoke with patient, informed her that medication has been sent in and I apologized that we missed that one. Patient verbalized understanding.  Patient did inform me that one of the medication was having to be ordered and they informed her they may not get it. Patient will reach out to the office if pharmacy did not get it in.

## 2022-01-25 NOTE — ED Notes (Signed)
Updated pt about pending transfer. Pt ate crackers and feels better with her nausea. Declined need for zofran right now.

## 2022-01-25 NOTE — ED Provider Notes (Signed)
[facility] EMERGENCY DEPT Provider Note   CSN: [id number] Arrival date & time: 01/25/22  1736     History  Chief Complaint  Patient presents with   Shortness of Breath    Hannah Klein is a 46 y.o. female.  With PMH of asthma, GERD, HTN, GAD who presents with weeks of worsening dyspnea and fatigue associated with cough.  She was seen by her allergist and pulmonologist today who was concerned for her symptoms and sent a D-dimer which was elevated and told her to go to the ER to be evaluated for a blood clot in the lungs.  She notes over the past couple weeks she has had continued productive cough of clear sputum and points of coughing so hard that she will have nonbloody nonbilious emesis.  She has been using her inhalers and not having any severe wheezing or feeling like she is having an asthma exacerbation.  However she does feel like she is getting fatigued significantly with simple tasks and exertion.  No leg pain or swelling, no orthopnea or PND.  She is complain of substernal nonradiating chest pain and tightness and burning worse after deep breathing and repetitive coughs.  She has not completed any antibiotics or steroids recently.  She has had no fevers that she is aware of.  She checks her pulse ox at home and will find herself dropping to the low 80s intermittently which will recover when she is sitting down and resting.   Shortness of Breath      Home Medications Prior to Admission medications   Medication Sig Start Date End Date Taking? Authorizing Provider  albuterol (VENTOLIN HFA) 108 (90 Base) MCG/ACT inhaler INHALE 1-2 PUFFS BY MOUTH EVERY 6 HOURS AS NEEDED FOR WHEEZE OR SHORTNESS OF BREATH 10/31/21   Alfonse Spruce, MD  albuterol (VENTOLIN HFA) 108 (90 Base) MCG/ACT inhaler Inhale into the lungs.    [provider]  Cholecalciferol (VITAMIN D3 PO) Take 1,000 Units by mouth.    [provider]  clobetasol ointment (TEMOVATE)  0.05 % Apply 1 Application topically 2 (two) times daily. 01/25/22   Alfonse Spruce, MD  esomeprazole (NEXIUM) 40 MG capsule TAKE 1 CAPSULE (40 MG TOTAL) BY MOUTH 2 (TWO) TIMES DAILY. FOR HEARTBURN. 09/12/21   Doreene Nest, NP  famotidine (PEPCID) 40 MG tablet Take 1 tablet (40 mg total) by mouth 2 (two) times daily. 08/03/21   Alfonse Spruce, MD  FIBER PO Take 1 tablet by mouth daily as needed.    [provider]  formoterol (PERFOROMIST) 20 MCG/2ML nebulizer solution Take 2 mLs (20 mcg total) by nebulization 2 (two) times daily. 01/25/22 02/24/22  Alfonse Spruce, MD  hydrOXYzine (VISTARIL) 50 MG capsule TAKE 1 CAPSULE BY MOUTH ONCE OR TWICE DAILY AS NEEDED FOR ANXIETY 08/06/21   Doreene Nest, NP  levocetirizine (XYZAL) 5 MG tablet Take 1 tablet (5 mg total) by mouth in the morning and at bedtime. 01/25/22 04/25/22  Alfonse Spruce, MD  metoCLOPramide (REGLAN) 5 MG tablet Take 1 tablet (5 mg total) by mouth 4 (four) times daily. 01/25/22 02/24/22  Alfonse Spruce, MD  Multiple Vitamin (MULTIVITAMIN) capsule Take 1 capsule by mouth daily.    [provider]  rosuvastatin (CRESTOR) 10 MG tablet Take 1 tablet (10 mg total) by mouth daily. for cholesterol. 06/12/21   Doreene Nest, NP  sucralfate (CARAFATE) 1 g tablet Take 1 tablet (1 g total) by mouth in the morning,  at noon, and at bedtime. 01/25/22   Alfonse Spruce, MD  vitamin B-12 (CYANOCOBALAMIN) 1000 MCG tablet Take 1,000 mcg by mouth daily.    [provider]  vitamin C (ASCORBIC ACID) 500 MG tablet Take 500 mg by mouth daily.    [provider]  vitamin E 100 UNIT capsule Take 400 Units by mouth daily.    [provider]      Allergies    Advair diskus [fluticasone-salmeterol], Flonase [fluticasone], and Lipitor [atorvastatin calcium]    Review of Systems   Review of Systems  Respiratory:  Positive for shortness of breath.     Physical  Exam Updated Vital Signs BP (!) 145/87   Pulse 73   Temp (!) 97.5 F (36.4 C) (Oral)   Resp 18   Ht 5\' 9"  (1.753 m)   Wt 97.7 kg   LMP 01/18/2022   SpO2 98%   BMI 31.81 kg/m  Physical Exam Constitutional: Alert and oriented.  No acute distress nontoxic Eyes: Conjunctivae are normal. ENT      Head: Normocephalic and atraumatic.      Nose: No congestion.      Mouth/Throat: Mucous membranes are moist.      Neck: No stridor. Cardiovascular: S1, S2,  Normal and symmetric distal pulses are present in all extremities.Warm and well perfused. Respiratory: Normal respiratory effort. Breath sounds are normal.  O2 sat 98-100 on RA Gastrointestinal: Soft and nondistended Musculoskeletal: Normal range of motion in all extremities. Trace pitting edema of the distal lower extremities Neurologic: Normal speech and language. No gross focal neurologic deficits are appreciated. Skin: Skin is warm, dry and intact. No rash noted. Psychiatric: Mildly anxious ED Results / Procedures / Treatments   Labs (all labs ordered are listed, but only abnormal results are displayed) Labs Reviewed  COMPREHENSIVE METABOLIC PANEL - Abnormal; Notable for the following components:      Result Value   Total Protein 8.4 (*)    All other components within normal limits  TROPONIN I (HIGH SENSITIVITY) - Abnormal; Notable for the following components:   Troponin I (High Sensitivity) 61 (*)    All other components within normal limits  TROPONIN I (HIGH SENSITIVITY) - Abnormal; Notable for the following components:   Troponin I (High Sensitivity) 42 (*)    All other components within normal limits  CBC  HCG, SERUM, QUALITATIVE  BRAIN NATRIURETIC PEPTIDE    EKG EKG Interpretation  Date/Time:  Thursday January 25 2022 18:21:06 EST Ventricular Rate:  72 PR Interval:  172 QRS Duration: 86 QT Interval:  406 QTC Calculation: 444 R Axis:   21 Text Interpretation: Normal sinus rhythm Normal ECG When compared with  ECG of 20-Feb-2018 15:24, No significant change was found Confirmed by 22-Feb-2018 (Vivien Rossetti) on 01/25/2022 7:44:35 PM  Radiology CT Angio Chest PE W and/or Wo Contrast  Result Date: 01/25/2022 CLINICAL DATA:  Shortness of breath, chest pain, concern for pulmonary embolism. EXAM: CT ANGIOGRAPHY CHEST WITH CONTRAST TECHNIQUE: Multidetector CT imaging of the chest was performed using the standard protocol during bolus administration of intravenous contrast. Multiplanar CT image reconstructions and MIPs were obtained to evaluate the vascular anatomy. RADIATION DOSE REDUCTION: This exam was performed according to the departmental dose-optimization program which includes automated exposure control, adjustment of the mA and/or kV according to patient size and/or use of iterative reconstruction technique. CONTRAST:  40mL OMNIPAQUE IOHEXOL 350 MG/ML SOLN COMPARISON:  Chest CT dated 04/07/2021. FINDINGS: Cardiovascular: Satisfactory opacification of the pulmonary arteries to  the segmental level. No evidence of pulmonary embolism. The heart is mildly enlarged. No pericardial effusion. Mediastinum/Nodes: No enlarged mediastinal, hilar, or axillary lymph nodes. Thyroid gland, trachea, and esophagus demonstrate no significant findings. Lungs/Pleura: Moderate bilateral peripheral predominant ground-glass opacities are noted. There is no pleural effusion or pneumothorax. Upper Abdomen: There is a small sliding hiatal hernia. Musculoskeletal: No chest wall abnormality. No acute or significant osseous findings. Review of the MIP images confirms the above findings. IMPRESSION: 1. No evidence of pulmonary embolism. 2. Moderate bilateral peripheral predominant ground-glass opacities, likely representing pneumonia. Electronically Signed   By: Romona Curls M.D.   On: 01/25/2022 20:12    Procedures Procedures  Remain on constant cardiac monitoring which I personally reviewed normal sinus rhythm with normal  rates.  Medications Ordered in ED Medications  ondansetron (ZOFRAN) injection 4 mg (0 mg Intravenous Hold 01/25/22 2226)  iohexol (OMNIPAQUE) 350 MG/ML injection 100 mL (75 mLs Intravenous Contrast Given 01/25/22 1944)  cefTRIAXone (ROCEPHIN) 2 g in sodium chloride 0.9 % 100 mL IVPB (0 g Intravenous Stopped 01/25/22 2158)  azithromycin (ZITHROMAX) 500 mg in sodium chloride 0.9 % 250 mL IVPB (500 mg Intravenous New Bag/Given 01/25/22 2157)    ED Course/ Medical Decision Making/ A&P Clinical Course as of 01/25/22 2357  Thu Jan 25, 2022  2141 Discussed case with on-call hospitalist who is excepted patient for admission for further workup including continued antibiotics and echocardiogram. [VB]    Clinical Course User Index [VB] Mardene Sayer, MD                           Medical Decision Making  SHAREEN CAPWELL is a 46 y.o. female.  With PMH of asthma, GERD, HTN, GAD who presents with weeks of worsening dyspnea and fatigue associated with cough.   Based on patient's history and symptoms and elevated D-dimer, consider possible viral URI versus pneumonia versus PE among multiple other etiologies contributing to patient's chest pain and shortness of breath and intermittent hypoxia at home.  She has no wheezing on exam or increased work of breathing, no concern for asthma exacerbation at this time.  EKG obtained and reviewed by me it was normal sinus rhythm with no acute ST/T changes concerning for acute ischemia.  I have very low suspicion for atypical ACS.  Labs obtained and personally reviewed by me normal white blood cell count 8.7, no anemia hemoglobin 13.8.  She has normal creatinine 0.79 and no acute electrolyte abnormalities.  However troponin elevated 61.  CTA PE study performed which I personally reviewed which showed bilateral groundglass opacities concerning for bilateral pneumonia contributing to patient's symptoms.  Her elevated troponin could be demand ischemia in the  setting of multifocal pneumonia versus possible developing heart failure or strain or myocarditis in the setting of infection.  I do think she requires continued antibiotics and further workup including echocardiogram.  Starting patient on IV Rocephin and azithromycin and admitting for further workup and management.    Amount and/or Complexity of Data Reviewed Labs: ordered. Radiology: ordered.  Risk Prescription drug management. Decision regarding hospitalization.    Final Clinical Impression(s) / ED Diagnoses Final diagnoses:  Elevated troponin  Multifocal pneumonia  Dyspnea, unspecified type    Rx / DC Orders ED Discharge Orders     None         Mardene Sayer, MD 01/25/22 2357

## 2022-01-25 NOTE — Progress Notes (Signed)
Transferring facility: [facility] Requesting provider: Dr. Elpidio Anis (EDP at [facility]) Reason for transfer: admission for further evaluation and management of CAP.       46  year old female with medical history notable for moderate persistent asthma, who presented to [facility] ED for further evaluation of elevated D-dimer checked as an outpatient by her pulmonologist earlier today, after presenting to the latter complaining of persistent nonproductive cough over the last 7 to 10 days associate with some dyspnea on exertion, but in the absence of fever.  This been associated with generalized weakness as well as pleuritic substernal chest pain that is reportedly nonexertional, but is prompted/exacerbated with deep inspiration or cough.  The patient denies any known baseline supplemental oxygen demands.  She conveys that she has been intermittently checking her pulse oximetry at home over the last week, intermittently noting her O2 sat transiently decreased into the 80s, which is new for her.  No acute hypoxia noted at [facility] today.  CTA chest at [facility] today showed no evidence of acute PE, but did show evidence of bilateral opacities concerning for pneumonia, for which she was started on azithromycin and Rocephin.   Vital signs in the ED were notable for the following: Afebrile; no evidence of hypotension.  Labs were notable for initial troponin I 61, with repeat trending down to 42.  EKG reportedly showed no evidence of acute ischemic changes.  No reported recent echocardiogram on file.   Subsequently, I accepted this patient for transfer for inpatient admission to a med/tele bed at [facility] or [facility] (first available) for further work-up and management of suspected community-acquired pneumonia as well as mildly elevated troponin, now trending down.       Check [email or web address] for on-call coverage.   Nursing staff, Please call [facility] Admits & Consults System-Wide number on Amion as soon as patient's arrival, so  appropriate admitting provider can evaluate the pt.     Newton Pigg, DO Hospitalist

## 2022-01-25 NOTE — ED Triage Notes (Signed)
Pt presents with increased difficulty breathing, pain in chest when breathing. Had blood work done today with d-dimer 1.69 And was sent to ED to rule out PE. NAD noted at triage.

## 2022-01-25 NOTE — Progress Notes (Signed)
FOLLOW UP  Date of Service/Encounter:  01/25/22   Assessment:   Moderate persistent asthma, uncomplicated   Chronic rhinitis (dust mites and cockroach)   Gastroesophageal reflux disease and gastroparesis     Normal EKG in 2019 and normal echocardiogram in 2022   OSA - previously on CPAP (but has not used it since January 2023)  Previous smoker - quit in 2009    Hannah Klein continues to have a lot of problems despite our best efforts. She is having severe SOB. Hannah Klein has done nothing to address her coughing and SOB, so we are stopping that today. We are also going get a repeat chest CT and a D-dimer. We are going to get some labs to look for autoimmune issues that can be associated with her pulmonary issues. I think that is a multifactorial process. We are adding on Carafate to help with GERD control and I am adding on Reglan to help with motility. I did warn her of the side effects of the Reglan and she is going to let us know if anything happens. I hope this will at least help her until she can get in to see Dr. Marina Goodell. I am also adding on formoterol twice daily via nebulizer to see if this will help. I talked to her about why this is typically not used as a monotherapy and I recommended that she seek help if this is helping.   Plan/Recommendations:   1. Moderate persistent asthma, uncomplicated - Lung testing looks lower today. - I do not think the Hannah Klein is helping at all.  - We are going to get another chest CT to see where things are progressing.  - We are going to get some labs to look for autoimmune issues and rule out a pulmonary embolism.  - I am going to talk to Dr. Delton Coombes about next steps.  - Nebulizer machine and demonstration provided.  - Daily controller medication(s): Perforomist twice daily via nebulizer - Prior to physical activity: albuterol 2 puffs 10-15 minutes before physical activity. - Rescue medications: albuterol 4 puffs every 4-6 hours as needed -  Asthma control goals:  * Full participation in all desired activities (may need albuterol before activity) * Albuterol use two time or less a week on average (not counting use with activity) * Cough interfering with sleep two time or less a month * Oral steroids no more than once a year * No hospitalizations  2. Chronic rhinitis - Previous testing today showed: dust mites and cockroach. - Continue with: Xyzal (levocetirizine) 5mg  tablet twice daily   3. Gastroesophageal reflux disease - Continue with Nexium twice daily. - Continue with famotidine 40mg  twice daily. - Add on Carafate three times daily (this coats the stomach to help with reflux symptoms). - Add on Reglan four times daily (pro-motility agent).   4. Return in about 3 months (around 04/26/2022).    Subjective:   Hannah Klein is a 46 y.o. female presenting today for follow up of  Chief Complaint  Patient presents with   Asthma    PATIENT STATE SHE CAN'T BREATH    Hannah Klein has a history of the following: Patient Active Problem List   Diagnosis Date Noted   Perennial allergic rhinitis 09/21/2021   Neck pain 08/31/2021   Allergic rhinitis due to pollen 08/31/2021   Moderate persistent asthma, uncomplicated 08/04/2021   Vitamin D deficiency 06/09/2021   Acute recurrent frontal sinusitis 04/28/2021   BPV (benign positional vertigo), right 04/28/2021  Right elbow pain 04/28/2021   Right tennis elbow 04/28/2021   Upper airway cough syndrome 09/28/2020   Persistent cough for 3 weeks or longer 08/26/2020   Pulmonary nodule 04/29/2020   Dyspnea 04/04/2020   Chronic rhinitis 04/04/2020   OSA (obstructive sleep apnea) 04/04/2020   Essential hypertension 11/11/2017   Hyperlipidemia 11/29/2016   Encounter for annual general medical examination with abnormal findings in adult 11/23/2015   Constipation 11/23/2015   Generalized anxiety disorder 04/26/2015   Right-sided low back pain with right-sided  sciatica 04/26/2015   Asthma 04/26/2015   Frequent headaches 04/26/2015   Osteoarthritis 04/26/2015   GERD (gastroesophageal reflux disease) 04/26/2015    History obtained from: chart review and patient.  Hannah Klein is a 46 y.o. female presenting for a follow up visit.  She was last seen in July 2023.  At that time, we recommended stopping all of her inhalers because she was having adverse reactions to many of them.  Most recently, the Breztri was causing burning in her chest.  She was having deep yawning with the Breo.  Albuterol seemed to help for 30 minutes.  We talked about sending her to see a vocal cord specialist and we got that referral placed. She did start [place]sp[place] at the last visit and has been very compliant with that.  For her rhinitis, she had testing in the past that was positive to dust mite and cockroach.  We started Xyzal 5 mg twice daily and she wanted to give Navage a try.  We stopped the Pepcid since it did not seem to help.  We continue the Nexium twice daily.  She did go see Dr. Rubye OaksMadden on August 3.  The laryngoscopy demonstrated normal vocal cord movement.  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  Reassurance was recommended and voice rest.  Surgery is rarely indicated for this, per the ENT note.  Cough suppression therapy was discussed.  She was started on famotidine 40 mg in conjunction with her Nexium.  She followed up with Dr. Rubye OaksMadden on October 26.  She was referred to gastroenterology.  Since last visit, she has not done well. The lump in her throat improved, but she never got another visualization of it. She is seeing Dr. Rubye OaksMadden on a PRN basis at this point in time.   Asthma/Respiratory Symptom History: The [place]spire does not seem to be doing anything at all.  She has received 4 injections now.  She is now getting rashes near the injection sites. They are on the upper arms only. This is rather random.  It itches very badly.  She tells me that poison ivy is better  than the itching she is experiencing from the [place]. Lung testing is worse today. She is using albuterol 2-3 times per day with transient relief of symptoms. She is having a lot chest pain. She has reacted to a number of inhalers with various side effects. She is unsure whether prednisone helped at all. The pain and deep breathing is a new issue; she has had classic asthma for 10 to 15 years. The chest pain has been going on for a month or two.  She had COVID earlier in 2023, but her episodes of shortness of breath really date back to 2022.  None of the inhalers have been helpful for her at all.  Mornings are the worst. Pain seems to be worse at that time of the day. She got a pulse ox that drops to 82% with a high heart rate.  She only recently started checking her pulse ox.  She has not followed up with Dr. Delton Coombes since she started checking her pulse ox.  She does not have a follow-up appointment scheduled with him at all.  She has never had a D-dimer.  She has no history of pulmonary embolisms.  There is no history of autoimmunity in the family.  There is no history of lupus.  There is no history of rheumatoid arthritis or sarcoidosis.  She works as a Administrator.  She denies any new exposures.  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  Her brother owns a Actor, so he has been pretty flexible with her schedule.  She has not seen cardiology yet.  She is a previous smoker.  She quit in 2009.  Per review of her chest CTs, her pulmonary nodules have remained stable.  Her last chest CT was in February 2023.  IMPRESSION: 1. New mild patchy ground-glass opacity at the periphery of the mid to lower lungs bilaterally, asymmetrically prominent on the left. Findings are nonspecific and most likely infectious or inflammatory with differential including atypical/viral infection. 2. Previously visualized small pulmonary nodules  are stable and considered benign.  Allergic Rhinitis Symptom History: Her allergic rhinitis is not been a huge concern.  She is using levocetirizine twice daily which seems to be working.  She was going to start using Navage to help with mucociliary clearance, but she does not really tolerate it very much.  She has not needed antibiotics at all.  Her infectious history is largely unremarkable.  She does not remember the last time she got antibiotics.  She avoids cockroaches and dust mites as much as she can.  GERD Symptom History: She is on Nexium and famotidine. UGI demonstrated hiatal hernia as well as reflux. She is going to see Dr. Leontine Locket next month. She is on Nexium twice daily and famotidine. With the Nexium once daily, she was having severe reflux. She has a history of gastroparesis for years from unknown reasons.  She was just told to watch what she eats when she was diagnosed with gastroparesis.  This was when she went in [place].  She did not really have a reason given for gastroparesis.  There does not seem to be a family history of this and she has no history of diabetes.  She does have an appointment with GI next month. She has an appointment with Dr. Marina Goodell on December 26th.  She is on a cancellation list to get in sooner if needed.  She has never been to see GI.   Otherwise, there have been no changes to her past medical history, surgical history, family history, or social history.    Review of Systems  Constitutional:  Positive for malaise/fatigue. Negative for chills, fever and weight loss.  HENT:  Positive for congestion. Negative for ear discharge, ear pain, nosebleeds and sinus pain.   Eyes:  Negative for pain, discharge and redness.  Respiratory:  Positive for cough, sputum production and shortness of breath. Negative for wheezing.   Cardiovascular: Negative.  Negative for chest pain and palpitations.  Gastrointestinal:  Positive for heartburn and nausea. Negative for  abdominal pain, constipation, diarrhea and vomiting.  Skin:  Positive for itching. Negative for rash.  Neurological:  Negative for dizziness and headaches.  Endo/Heme/Allergies:  Positive for environmental allergies. Does not bruise/bleed easily.       Objective:  Blood pressure 118/76, pulse 69, temperature 98.9 F (37.2 C), temperature source Temporal, resp. rate 20, height 5\' 9"  (1.753 m), weight 222 lb (100.7 kg), SpO2 97 %. Body mass index is 32.78 kg/m.    Physical Exam Vitals reviewed.  Constitutional:      Appearance: She is well-developed.     Comments: Tearful. Frustrated, understandably so.   HENT:     Head: Normocephalic and atraumatic.     Right Ear: Tympanic membrane, ear canal and external ear normal. No drainage, swelling or tenderness. Tympanic membrane is not injected, scarred, erythematous, retracted or bulging.     Left Ear: Tympanic membrane, ear canal and external ear normal. No drainage, swelling or tenderness. Tympanic membrane is not injected, scarred, erythematous, retracted or bulging.     Nose: No nasal deformity, septal deviation, mucosal edema or rhinorrhea.     Right Turbinates: Enlarged and swollen.     Left Turbinates: Enlarged and swollen.     Right Sinus: No maxillary sinus tenderness or frontal sinus tenderness.     Left Sinus: No maxillary sinus tenderness or frontal sinus tenderness.     Comments: Turbinates erythematous. No nasal polyps. Turbinates are more erythematous today.     Mouth/Throat:     Lips: Pink.     Mouth: Mucous membranes are moist. Mucous membranes are not pale and not dry.     Pharynx: Uvula midline.     Comments: Cobblestoning present in the posterior oropharynx. Eyes:     General: Lids are normal. Allergic shiner present.        Right eye: No discharge.        Left eye: No discharge.     Conjunctiva/sclera: Conjunctivae normal.     Right eye: Right conjunctiva is not injected. No chemosis.    Left eye: Left  conjunctiva is not injected. No chemosis.    Pupils: Pupils are equal, round, and reactive to light.  Cardiovascular:     Rate and Rhythm: Normal rate and regular rhythm.     Heart sounds: Normal heart sounds.  Pulmonary:     Effort: Pulmonary effort is normal. No accessory muscle usage or respiratory distress.     Breath sounds: Normal breath sounds. No wheezing, rhonchi or rales.     Comments: Decreased air movement at the bases. Intermittently tachypneic. No crackles or wheezes noted. Midline anterior chest pain with inspiration. Difficult to take full breaths.  Chest:     Chest wall: No tenderness.  Abdominal:     Tenderness: There is no abdominal tenderness. There is no guarding or rebound.  Lymphadenopathy:     Head:     Right side of head: No submandibular, tonsillar or occipital adenopathy.     Left side of head: No submandibular, tonsillar or occipital adenopathy.     Cervical: No cervical adenopathy.  Skin:    Coloration: Skin is not pale.     Findings: No abrasion, erythema, petechiae or rash. Rash is not papular, urticarial or vesicular.  Neurological:     Mental Status: She is alert.  Psychiatric:        Behavior: Behavior is cooperative.      Diagnostic studies:    Spirometry: results abnormal (FEV1: 1.61/48%, FVC: 1.95/46%, FEV1/FVC: 83%).    Spirometry consistent with possible restrictive disease. Overall this is much worse compared to previous spirometry.  Allergy Studies: labs sent instead       , MD  [facility]

## 2022-01-25 NOTE — Telephone Encounter (Signed)
Patient went to pick up prescriptions from pharmacy. Patient states she still needs carafate to be sent over.   [facility] [address] [place] [address]  Best contact number: [phone]

## 2022-01-26 ENCOUNTER — Inpatient Hospital Stay (HOSPITAL_BASED_OUTPATIENT_CLINIC_OR_DEPARTMENT_OTHER): Payer: Commercial Managed Care - HMO

## 2022-01-26 ENCOUNTER — Other Ambulatory Visit (HOSPITAL_COMMUNITY): Payer: Self-pay

## 2022-01-26 DIAGNOSIS — R0609 Other forms of dyspnea: Secondary | ICD-10-CM | POA: Diagnosis not present

## 2022-01-26 DIAGNOSIS — R052 Subacute cough: Secondary | ICD-10-CM

## 2022-01-26 DIAGNOSIS — J984 Other disorders of lung: Secondary | ICD-10-CM

## 2022-01-26 DIAGNOSIS — R7989 Other specified abnormal findings of blood chemistry: Secondary | ICD-10-CM

## 2022-01-26 HISTORY — DX: Other disorders of lung: J98.4

## 2022-01-26 LAB — RESPIRATORY PANEL BY PCR

## 2022-01-26 LAB — ECHOCARDIOGRAM COMPLETE
AR max vel: 2.45 cm2
AV Area VTI: 2.68 cm2
AV Area mean vel: 2.48 cm2
AV Mean grad: 5 mmHg
AV Peak grad: 9.5 mmHg
Ao pk vel: 1.54 m/s
Area-P 1/2: 3.21 cm2
Height: 69 in
S' Lateral: 3 cm
Weight: 3446.4 oz

## 2022-01-26 LAB — SARS CORONAVIRUS 2 BY RT PCR: SARS Coronavirus 2 by RT PCR: NEGATIVE

## 2022-01-26 LAB — URINALYSIS, ROUTINE W REFLEX MICROSCOPIC
Bilirubin Urine: NEGATIVE
Glucose, UA: NEGATIVE mg/dL
Hgb urine dipstick: NEGATIVE
Ketones, ur: NEGATIVE mg/dL
Leukocytes,Ua: NEGATIVE
Nitrite: NEGATIVE
Protein, ur: NEGATIVE mg/dL
Specific Gravity, Urine: 1.009 (ref 1.005–1.030)
pH: 6 (ref 5.0–8.0)

## 2022-01-26 LAB — PROCALCITONIN: Procalcitonin: <0.1 ng/mL

## 2022-01-26 LAB — SEDIMENTATION RATE: Sed Rate: 17 mm/hr (ref 0–22)

## 2022-01-26 LAB — C-REACTIVE PROTEIN: CRP: 1.2 mg/dL — ABNORMAL HIGH (ref <1.0)

## 2022-01-26 LAB — STREP PNEUMONIAE URINARY ANTIGEN: Strep Pneumo Urinary Antigen: NEGATIVE

## 2022-01-26 LAB — HIV ANTIBODY (ROUTINE TESTING W REFLEX): HIV Screen 4th Generation wRfx: NONREACTIVE

## 2022-01-26 MED ORDER — PREDNISONE 10 MG PO TABS
ORAL_TABLET | ORAL | 0 refills | Status: AC
  Filled 2022-01-26: qty 72, 27d supply, fill #0

## 2022-01-26 MED ORDER — DICLOFENAC SODIUM 1 % EX GEL
2.0000 g | Freq: Four times a day (QID) | CUTANEOUS | Status: DC
  Administered 2022-01-26: 2 g via TOPICAL
  Filled 2022-01-26: qty 100

## 2022-01-26 MED ORDER — ONDANSETRON HCL 4 MG/2ML IJ SOLN: 4.0000 mg | Freq: Four times a day (QID) | INTRAMUSCULAR | Status: DC | PRN

## 2022-01-26 MED ORDER — PANTOPRAZOLE SODIUM 40 MG PO TBEC: 40.0000 mg | DELAYED_RELEASE_TABLET | Freq: Two times a day (BID) | ORAL | Status: DC

## 2022-01-26 MED ORDER — PREDNISONE 20 MG PO TABS
40.0000 mg | ORAL_TABLET | Freq: Once | ORAL | Status: AC
  Administered 2022-01-26: 40 mg via ORAL
  Filled 2022-01-26: qty 2

## 2022-01-26 MED ORDER — ESOMEPRAZOLE MAGNESIUM 20 MG PO CPDR
40.0000 mg | DELAYED_RELEASE_CAPSULE | Freq: Two times a day (BID) | ORAL | Status: DC
  Administered 2022-01-26: 40 mg via ORAL
  Filled 2022-01-26 (×2): qty 2

## 2022-01-26 MED ORDER — FAMOTIDINE 20 MG PO TABS
40.0000 mg | ORAL_TABLET | Freq: Two times a day (BID) | ORAL | Status: DC
  Administered 2022-01-26: 40 mg via ORAL
  Filled 2022-01-26: qty 2

## 2022-01-26 MED ORDER — SODIUM CHLORIDE 0.9 % IV SOLN
2.0000 g | INTRAVENOUS | Status: DC
  Administered 2022-01-26: 2 g via INTRAVENOUS
  Filled 2022-01-26: qty 20

## 2022-01-26 MED ORDER — CETIRIZINE HCL 10 MG PO TABS
10.0000 mg | ORAL_TABLET | Freq: Every day | ORAL | Status: DC
  Administered 2022-01-26: 10 mg via ORAL
  Filled 2022-01-26: qty 1

## 2022-01-26 MED ORDER — AMOXICILLIN 500 MG PO CAPS
1000.0000 mg | ORAL_CAPSULE | Freq: Three times a day (TID) | ORAL | 0 refills | Status: AC
  Filled 2022-01-26: qty 18, 3d supply, fill #0

## 2022-01-26 MED ORDER — AZITHROMYCIN 250 MG PO TABS
500.0000 mg | ORAL_TABLET | Freq: Every day | ORAL | Status: DC
  Administered 2022-01-26: 500 mg via ORAL
  Filled 2022-01-26: qty 2

## 2022-01-26 MED ORDER — METOCLOPRAMIDE HCL 5 MG PO TABS
5.0000 mg | ORAL_TABLET | Freq: Three times a day (TID) | ORAL | Status: DC
  Filled 2022-01-26: qty 1

## 2022-01-26 MED ORDER — ACETAMINOPHEN 650 MG RE SUPP: 650.0000 mg | Freq: Four times a day (QID) | RECTAL | Status: DC | PRN

## 2022-01-26 MED ORDER — ONDANSETRON HCL 4 MG PO TABS: 4.0000 mg | ORAL_TABLET | Freq: Four times a day (QID) | ORAL | Status: DC | PRN

## 2022-01-26 MED ORDER — NON FORMULARY: 40.0000 mg | Freq: Two times a day (BID) | Status: DC

## 2022-01-26 MED ORDER — ACETAMINOPHEN 325 MG PO TABS: 650.0000 mg | ORAL_TABLET | Freq: Four times a day (QID) | ORAL | Status: DC | PRN

## 2022-01-26 MED ORDER — AZITHROMYCIN 500 MG PO TABS
500.0000 mg | ORAL_TABLET | Freq: Every day | ORAL | 0 refills | Status: AC
  Filled 2022-01-26: qty 3, 3d supply, fill #0

## 2022-01-26 MED ORDER — SUCRALFATE 1 G PO TABS
1.0000 g | ORAL_TABLET | Freq: Three times a day (TID) | ORAL | Status: DC
  Administered 2022-01-26 (×2): 1 g via ORAL
  Filled 2022-01-26 (×2): qty 1

## 2022-01-26 NOTE — Telephone Encounter (Signed)
My apologies for missing that one.

## 2022-01-26 NOTE — Procedures (Signed)
Objective Swallowing Evaluation: Type of Study: FEES-Fiberoptic Endoscopic Evaluation of Swallow   Patient Details  Name: Hannah Klein MRN: [id number] Date of Birth: 14-Feb-1976  Today's Date: 01/26/2022 Time: SLP Start Time (ACUTE ONLY): 1250 -SLP Stop Time (ACUTE ONLY): 1308  SLP Time Calculation (min) (ACUTE ONLY): 18 min   Past Medical History:  Past Medical History:  Diagnosis Date   Arthritis    Asthma    Environmental allergies    Generalized anxiety disorder    GERD (gastroesophageal reflux disease)    Hx of colonic polyps    Hypertension    Migraines    MRSA (methicillin resistant Staphylococcus aureus)    Lt upper arm  approx 8 years ago   Past Surgical History:  Past Surgical History:  Procedure Laterality Date   CHOLECYSTECTOMY     DILATION AND CURETTAGE OF UTERUS     NECK SURGERY     Fusion to C6 and C7   TYMPANOSTOMY TUBE PLACEMENT     WRIST ARTHROSCOPY WITH DEBRIDEMENT Left 02/27/2018   Procedure: WRIST ARTHROSCOPY WITH DEBRIDEMENT;  Surgeon: Betha Loa, MD;  Location: [facility];  Service: Orthopedics;  Laterality: Left;   HPI: Hannah Klein is a 46 y.o. female who has had a cough and shortness of breath for 4 to 6 weeks and is sent to the ED for an elevated D-dimer. CT Chest 11/30: "Moderate bilateral peripheral predominant ground-glass opacities,  likely representing pneumonia." Pt with medical history of asthma, gastroesophageal reflux disease, hiatal hernia. ENT hx: vocal fold granuloma, MTD, laryngeal spasm, globus sensation. Laryngoscopy with Dr Rubye Oaks with [facility] and follow up w OP SLP   No data recorded   Recommendations for follow up therapy are one component of a multi-disciplinary discharge planning process, led by the attending physician.  Recommendations may be updated based on patient status, additional functional criteria and insurance authorization.  Assessment / Plan / Recommendation     01/26/2022    1:27 PM   Clinical Impressions  Clinical Impression Pt presents with grossly normal swallow function.  There was no penetration or aspiration of any consistencies trialed, including serial straw sips of thin liquid dairy (Ensure), despite subjective reports of increase in symptoms with dairy consumption. There was a trace coating of residue on pharyngeal mucosa after the swallow.  There was cobblestoning appearance to base of tongue.  Pulmonology confirms granulomatous appearance.  There was no vocal process granuloma observed today as was seen in ENT laryngoscopy in August.  Pt reports symptoms from this nodule have resolved. Recommend GI follow up to address esophageal dysphagia and continue management of reflux which may be contributing to her current symptoms.  She has OP appointment scheduled.  Provided educational handout to pt for esophageal precuations and swallowing.  Pt may benefit from OP SLP as indicated following further evaluation of esophageal swallow function and reflux management. Pt has no further acute ST needs.  SLP will sign off at this time.  Recommend continuing regular texture diet with thin liquid.         01/26/2022    1:27 PM  Treatment Recommendations  Treatment Recommendations No treatment recommended at this time        01/26/2022    1:33 PM  Prognosis  Prognosis for Safe Diet Advancement --       01/26/2022    1:27 PM  Diet Recommendations  SLP Diet Recommendations Regular solids;Thin liquid  Liquid Administration via Straw;Cup  Medication Administration Whole meds with liquid  Compensations Follow solids with liquid;Slow rate;Small sips/bites  Postural Changes Seated upright at 90 degrees;Remain semi-upright after after feeds/meals (Comment)         01/26/2022    1:27 PM  Other Recommendations  Recommended Consults --  Oral Care Recommendations Oral care BID  Follow Up Recommendations No SLP follow up  Functional Status Assessment Patient has not had a recent  decline in their functional status       01/26/2022    1:27 PM  Frequency and Duration   Speech Therapy Frequency (ACUTE ONLY) --         01/26/2022    1:26 PM  Oral Phase  Oral Phase Impaired  Oral - Puree WFL  Oral - Regular [facility]       01/26/2022    1:26 PM  Pharyngeal Phase  Pharyngeal Phase Impaired  Pharyngeal- Thin Straw WFL  Pharyngeal Material does not enter airway  Pharyngeal- Puree WFL  Pharyngeal Material does not enter airway  Pharyngeal- Regular [facility]  Pharyngeal Material does not enter airway        01/26/2022    1:27 PM  Cervical Esophageal Phase   Cervical Esophageal Phase [facility]     Kerrie Pleasure, MA, CCC-SLP Acute Rehabilitation Services Office: [phone] 01/26/2022, 1:34 PM

## 2022-01-26 NOTE — Evaluation (Signed)
Clinical/Bedside Swallow Evaluation Patient Details  Name: Hannah Klein MRN: [id number] Date of Birth: 28-Oct-1975  Today's Date: 01/26/2022 Time: SLP Start Time (ACUTE ONLY): 1220 SLP Stop Time (ACUTE ONLY): 1240 SLP Time Calculation (min) (ACUTE ONLY): 20 min  Past Medical History:  Past Medical History:  Diagnosis Date   Arthritis    Asthma    Environmental allergies    Generalized anxiety disorder    GERD (gastroesophageal reflux disease)    Hx of colonic polyps    Hypertension    Migraines    MRSA (methicillin resistant Staphylococcus aureus)    Lt upper arm  approx 8 years ago   Past Surgical History:  Past Surgical History:  Procedure Laterality Date   CHOLECYSTECTOMY     DILATION AND CURETTAGE OF UTERUS     NECK SURGERY     Fusion to C6 and C7   TYMPANOSTOMY TUBE PLACEMENT     WRIST ARTHROSCOPY WITH DEBRIDEMENT Left 02/27/2018   Procedure: WRIST ARTHROSCOPY WITH DEBRIDEMENT;  Surgeon: Betha Loa, MD;  Location: [facility];  Service: Orthopedics;  Laterality: Left;   HPI:  Hannah Klein is a 46 y.o. female who has had a cough and shortness of breath for 4 to 6 weeks and is sent to the ED for an elevated D-dimer. CT Chest 11/30: "Moderate bilateral peripheral predominant ground-glass opacities,  likely representing pneumonia." Pt with medical history of asthma, gastroesophageal reflux disease, hiatal hernia. ENT hx: vocal fold granuloma, MTD, laryngeal spasm, globus sensation. Laryngoscopy with Dr Rubye Oaks with [facility] and follow up w OP SLP    Assessment / Plan / Recommendation  Clinical Impression  Pt presents with clinical indicators of pharyngeal dysphagia.  There was frequent cough from slight to more prolonged delay.  Pt reports coughing episodes 5-10 minutes after eating.  She has hx of reflux and takes nexium and pepcid. Pt reports increased mucous production and nasal drainage  Pt has been seen by ENT with laryngscopy noting vocal process  granuloma. She had a barium esophagram and reports impaired esophageal contractions. She has been unable to follow up with GI, but has appointment scheduled.  She had follow up with OP SLP with recommendations for esophageal precautions and cough suppression.  Pt's cough does have perceptual quality of a reflux cough. Chest imaging is concerning for pneumonia.  Discussed preferences and possible courses of action with pt who is agreeable to FEES at this time.   Will proceed with instrumental assessment. SLP Visit Diagnosis: Dysphagia, unspecified (R13.10)    Aspiration Risk  Mild aspiration risk    Diet Recommendation  (pending FEES results)        Other  Recommendations      Recommendations for follow up therapy are one component of a multi-disciplinary discharge planning process, led by the attending physician.  Recommendations may be updated based on patient status, additional functional criteria and insurance authorization.  Follow up Recommendations  (pending FEES results)      Assistance Recommended at Discharge    Functional Status Assessment  (pending FEES results)  Frequency and Duration  (pending FEES results)          Prognosis Prognosis for Safe Diet Advancement:  (pending FEES results)      Swallow Study   General Date of Onset: 01/25/22 HPI: Hannah Klein is a 46 y.o. female who has had a cough and shortness of breath for 4 to 6 weeks and is sent to the ED for an elevated D-dimer.  CT Chest 11/30: "Moderate bilateral peripheral predominant ground-glass opacities,  likely representing pneumonia." Pt with medical history of asthma, gastroesophageal reflux disease, hiatal hernia. ENT hx: vocal fold granuloma, MTD, laryngeal spasm, globus sensation. Laryngoscopy with Dr Rubye Oaks with [facility] and follow up w OP SLP Type of Study: Bedside Swallow Evaluation Previous Swallow Assessment: none Diet Prior to this Study: Regular;Thin liquids Temperature Spikes Noted:  No Respiratory Status: Room air History of Recent Intubation: No Behavior/Cognition: Alert;Cooperative;Pleasant mood Oral Cavity Assessment: Within Functional Limits Oral Care Completed by SLP: No Oral Cavity - Dentition: Adequate natural dentition Vision: Functional for self-feeding Self-Feeding Abilities: Able to feed self Patient Positioning: Upright in bed Baseline Vocal Quality: Normal Volitional Cough: Strong Volitional Swallow: Able to elicit    Oral/Motor/Sensory Function Overall Oral Motor/Sensory Function: Within functional limits Facial ROM: Within Functional Limits Facial Symmetry: Within Functional Limits Lingual ROM: Within Functional Limits Lingual Symmetry: Within Functional Limits Lingual Strength: Within Functional Limits Velum: Within Functional Limits Mandible: Within Functional Limits   Ice Chips Ice chips: Not tested   Thin Liquid Thin Liquid: Impaired Pharyngeal  Phase Impairments: Throat Clearing - Immediate;Cough - Delayed    Nectar Thick Nectar Thick Liquid: Not tested   Honey Thick Honey Thick Liquid: Not tested   Puree Puree: Impaired Presentation: Spoon Pharyngeal Phase Impairments: Cough - Delayed   Solid     Solid: Impaired Presentation: Self Fed Pharyngeal Phase Impairments: Throat Clearing - Immediate;Cough - Delayed      Hannah Pleasure, MA, CCC-SLP Acute Rehabilitation Services Office: [phone] 01/26/2022,1:25 PM

## 2022-01-26 NOTE — Progress Notes (Signed)
Lower extremity venous duplex has been completed.   Preliminary results in CV Proc.   Hannah Klein 01/26/2022 11:32 AM

## 2022-01-26 NOTE — Consult Note (Signed)
NAME:  Hannah Klein, MRN:  [id number], DOB:  Apr 30, 1975, LOS: 1 ADMISSION DATE:  01/25/2022, CONSULTATION DATE: 01/26/2022 REFERRING MD: [facility], CHIEF COMPLAINT: 4 weeks of increasing shortness of breath just with exertion cough after eating.  History of Present Illness:  46 year old female with remote tobacco history, diagnosed with asthma in her teen years he has had 4 to 6 weeks of increasing shortness of breath, dyspnea on exertion coughing following meals and general malaise.  She is evaluated by an allergist noted to have an elevated D-dimer was sent to [facility] for CT scan to rule out pulmonary embolism.  No PE but groundglass opacities are noted with possible pneumonia.  Been treated with antimicrobial therapy she is to undergo 2D echo in the near future rule out cardiac issues due to her complaints of chest pain with coughing and with palpation.  She is followed by Dr. Delton Coombes of pulmonary and he will evaluate her today 01/26/2022.  Pertinent  Medical History   Past Medical History:  Diagnosis Date   Arthritis    Asthma    Environmental allergies    Generalized anxiety disorder    GERD (gastroesophageal reflux disease)    Hx of colonic polyps    Hypertension    Migraines    MRSA (methicillin resistant Staphylococcus aureus)    Lt upper arm  approx 8 years ago     Significant Hospital Events: Including procedures, antibiotic start and stop dates in addition to other pertinent events     Interim History / Subjective:  Currently in no acute distress  Objective   Blood pressure 123/69, pulse 70, temperature 98 F (36.7 C), temperature source Oral, resp. rate 16, height 5\' 9"  (1.753 m), weight 97.7 kg, last menstrual period 01/18/2022, SpO2 95 %.        Intake/Output Summary (Last 24 hours) at 01/26/2022 14/02/2021 Last data filed at 01/25/2022 2359 Gross per 24 hour  Intake 505.37 ml  Output --  Net 505.37 ml   Filed Weights   01/25/22 2300  Weight: 97.7 kg     Examination: General: Obese female in no acute distress HENT: No JVD or lymphadenopathy is appreciated Lungs: Clear to auscultation Cardiovascular: Heart sounds regular complains of midsternal chest pain that increases with coughing and external pressure Abdomen: Obese soft positive bowel sounds Extremities: Warm and dry Neuro: Grossly intact without focal defect GU: Voids  Resolved Hospital Problem list     Assessment & Plan:  Increasing dyspnea on exertion, coughing after eating nonresponsive to multiple interventions.  Per allergy and pulmonary.  Noted to have elevated D-dimer CT scan of the chest did not reveal pulmonary embolism but did show bilateral groundglass opacities consistent with pneumonia.  Asthma since childhood. Agree with antimicrobial therapy for now Agree with Nexium and Pepcid plus Reglan Not requiring oxygen Not in acute distress 2D echo to rule out cardiac involvement  Elevated D-dimer Current autoimmune evaluation  Recent finding of hiatal hernia esophageal dysmotility with coughing after every meal. Agree with Pepcid Nexium and Reglan GI consult   Best Practice (right click and "Reselect all SmartList Selections" daily)   Diet/type: Regular consistency (see orders) DVT prophylaxis: not indicated GI prophylaxis: PPI Lines: N/A Foley:  N/A Code Status:  full code Last date of multidisciplinary goals of care discussion [tbd]  Labs   CBC: Recent Labs  Lab 01/25/22 1828  WBC 8.7  HGB 13.8  HCT 40.3  MCV 84.7  PLT 235    Basic Metabolic Panel:  Recent Labs  Lab 01/25/22 1828  NA 135  K 3.9  CL 100  CO2 26  GLUCOSE 88  BUN 17  CREATININE 0.79  CALCIUM 9.7   GFR: Estimated Creatinine Clearance: 108.1 mL/min (by C-G formula based on SCr of 0.79 mg/dL). Recent Labs  Lab 01/25/22 1828  WBC 8.7    Liver Function Tests: Recent Labs  Lab 01/25/22 1828  AST 32  ALT 18  ALKPHOS 68  BILITOT 0.8  PROT 8.4*  ALBUMIN 4.6    No results for input(s): "LIPASE", "AMYLASE" in the last 168 hours. No results for input(s): "AMMONIA" in the last 168 hours.  ABG No results found for: "PHART", "PCO2ART", "PO2ART", "HCO3", "TCO2", "ACIDBASEDEF", "O2SAT"   Coagulation Profile: No results for input(s): "INR", "PROTIME" in the last 168 hours.  Cardiac Enzymes: No results for input(s): "CKTOTAL", "CKMB", "CKMBINDEX", "TROPONINI" in the last 168 hours.  HbA1C: Hgb A1c MFr Bld  Date/Time Value Ref Range Status  06/09/2021 09:48 AM 5.6 4.6 - 6.5 % Final    Comment:    Glycemic Control Guidelines for People with Diabetes:Non Diabetic:  <6%Goal of Therapy: <7%Additional Action Suggested:  >8%   03/17/2020 10:16 AM 5.5 4.6 - 6.5 % Final    Comment:    Glycemic Control Guidelines for People with Diabetes:Non Diabetic:  <6%Goal of Therapy: <7%Additional Action Suggested:  >8%     CBG: No results for input(s): "GLUCAP" in the last 168 hours.  Review of Systems:   10 point review of system taken, please see HPI for positives and negatives. Positive for increasing shortness of breath dyspnea on exertion for 4 weeks Denies fevers chills or sweats Denies purulent sputum Denies wheezing Not utilizing CPAP for her OSA secondary to recent upper respiratory infection Complains of severe dryness of mouth for which she is constantly rehydrating  Past Medical History:  She,  has a past medical history of Arthritis, Asthma, Environmental allergies, Generalized anxiety disorder, GERD (gastroesophageal reflux disease), colonic polyps, Hypertension, Migraines, and MRSA (methicillin resistant Staphylococcus aureus).   Surgical History:   Past Surgical History:  Procedure Laterality Date   CHOLECYSTECTOMY     DILATION AND CURETTAGE OF UTERUS     NECK SURGERY     Fusion to C6 and C7   TYMPANOSTOMY TUBE PLACEMENT     WRIST ARTHROSCOPY WITH DEBRIDEMENT Left 02/27/2018   Procedure: WRIST ARTHROSCOPY WITH DEBRIDEMENT;  Surgeon:  Betha Loa, MD;  Location: [facility];  Service: Orthopedics;  Laterality: Left;     Social History:   reports that she quit smoking about 14 years ago. Her smoking use included cigarettes. She started smoking about 30 years ago. She has a 36.00 pack-year smoking history. She has been exposed to tobacco smoke. She has never used smokeless tobacco. She reports that she does not drink alcohol and does not use drugs.   Family History:  Her family history includes Allergic rhinitis in her brother and brother; Angioedema in an other family member; Asthma in her brother and brother; Eczema in her mother; Urticaria in an other family member.   Allergies Allergies  Allergen Reactions   Advair Diskus [Fluticasone-Salmeterol] Other (See Comments)    "dries my throat out really bad'   Flonase [Fluticasone]     Nose bleeds   Lipitor [Atorvastatin Calcium] Other (See Comments)    Elevations in blood pressure?     Home Medications  Prior to Admission medications   Medication Sig Start Date End Date Taking? Authorizing Provider  albuterol (VENTOLIN HFA) 108 (90 Base) MCG/ACT inhaler INHALE 1-2 PUFFS BY MOUTH EVERY 6 HOURS AS NEEDED FOR WHEEZE OR SHORTNESS OF BREATH 10/31/21   Alfonse Spruce, MD  albuterol (VENTOLIN HFA) 108 (90 Base) MCG/ACT inhaler Inhale into the lungs.    [provider]  Cholecalciferol (VITAMIN D3 PO) Take 1,000 Units by mouth.    [provider]  clobetasol ointment (TEMOVATE) 0.05 % Apply 1 Application topically 2 (two) times daily. 01/25/22   Alfonse Spruce, MD  esomeprazole (NEXIUM) 40 MG capsule TAKE 1 CAPSULE (40 MG TOTAL) BY MOUTH 2 (TWO) TIMES DAILY. FOR HEARTBURN. 09/12/21   Doreene Nest, NP  famotidine (PEPCID) 40 MG tablet Take 1 tablet (40 mg total) by mouth 2 (two) times daily. 08/03/21   Alfonse Spruce, MD  FIBER PO Take 1 tablet by mouth daily as needed.    [provider]  formoterol (PERFOROMIST)  20 MCG/2ML nebulizer solution Take 2 mLs (20 mcg total) by nebulization 2 (two) times daily. 01/25/22 02/24/22  Alfonse Spruce, MD  hydrOXYzine (VISTARIL) 50 MG capsule TAKE 1 CAPSULE BY MOUTH ONCE OR TWICE DAILY AS NEEDED FOR ANXIETY 08/06/21   Doreene Nest, NP  levocetirizine (XYZAL) 5 MG tablet Take 1 tablet (5 mg total) by mouth in the morning and at bedtime. 01/25/22 04/25/22  Alfonse Spruce, MD  metoCLOPramide (REGLAN) 5 MG tablet Take 1 tablet (5 mg total) by mouth 4 (four) times daily. 01/25/22 02/24/22  Alfonse Spruce, MD  Multiple Vitamin (MULTIVITAMIN) capsule Take 1 capsule by mouth daily.    [provider]  rosuvastatin (CRESTOR) 10 MG tablet Take 1 tablet (10 mg total) by mouth daily. for cholesterol. 06/12/21   Doreene Nest, NP  sucralfate (CARAFATE) 1 g tablet Take 1 tablet (1 g total) by mouth in the morning, at noon, and at bedtime. 01/25/22   Alfonse Spruce, MD  vitamin B-12 (CYANOCOBALAMIN) 1000 MCG tablet Take 1,000 mcg by mouth daily.    [provider]  vitamin C (ASCORBIC ACID) 500 MG tablet Take 500 mg by mouth daily.    [provider]  vitamin E 100 UNIT capsule Take 400 Units by mouth daily.    [provider]     Critical care time: Elizebeth Brooking Kirsty Monjaraz ACNP Acute Care Nurse Practitioner [facility] Please consult Amion 01/26/2022, 8:24 AM

## 2022-01-26 NOTE — Progress Notes (Signed)
Nurse requested Mobility Specialist to perform oxygen saturation test with pt which includes removing pt from oxygen both at rest and while ambulating.  Below are the results from that testing.     Patient Saturations on Room Air at Rest = spO2 97%  Patient Saturations on Room Air while Ambulating = sp02 93% .  Rested and performed pursed lip breathing for 1 minute with sp02 at 100%.  Patient Saturations on 0 Liters of oxygen while Ambulating = sp02 93%  At end of testing pt left in room on 0  Liters of oxygen.  Reported results to nurse.

## 2022-01-26 NOTE — Progress Notes (Signed)
Mobility Specialist - Progress Note   01/26/22 1043  Mobility  Activity Ambulated with assistance in hallway  Level of Assistance Standby assist, set-up cues, supervision of patient - no hands on  Assistive Device None  Distance Ambulated (ft) 450 ft  Activity Response Tolerated well  Mobility Referral No  $Mobility charge 1 Mobility    Pre-mobility: 97%SpO2 During mobility: 93%, SpO2 Post-mobility:96% SpO2  Pt received in bed and agreeable to mobility. Pt c/o SOB,Chest pain and lightheadedness during ambulation.Pt was returned to bed with all needs met and RN notified.   Franki Monte  Mobility Specialist Please contact via Solicitor or Rehab office at [phone]

## 2022-01-26 NOTE — Discharge Summary (Addendum)
Physician Discharge Summary  Darral DashChristina M Beckles NWG:[id number] DOB: [birth year] DOA: 01/25/2022  PCP: Doreene Nestlark, Katherine K, NP  Admit date: 01/25/2022 Discharge date: 01/26/2022  Time spent: 40 minutes  Recommendations for Outpatient Follow-up:  Follow outpatient CBC/CMP  Follow response to steroids, repeat CT scan with pulmonology outpatient Consider outpatient speech GI follow up for esophageal dysphagia Follow pending anti CCP. Follow urine legionella.  Follow pending ACE, ANA ordered outpatient.  Discharge Diagnoses:  Principal Problem:   Cough Active Problems:   GERD (gastroesophageal reflux disease)   Pneumonitis   Discharge Condition: stable  Diet recommendation: heart healthy  Filed Weights   01/25/22 2300  Weight: 97.7 kg    History of present illness:   Darral DashChristina M Darsey is Dequan Kindred 46 y.o. female with medical history of asthma, gastroesophageal reflux disease, hiatal hernia who has had Kaushik Maul cough and shortness of breath for 4 to 6 weeks and is sent to the ED for an elevated D-dimer.  The patient states that her D-dimer was checked due to ongoing cough and shortness of breath.  [facility] [address]  CTA of the chest did not reveal Nyara Capell PE but did reveal groundglass infiltrates.  She was diagnosed with pneumonia and admitted for further treatment.   Further history, the patient states that she has been diagnosed with asthma her current cough and shortness of breath do not feel like an asthma exacerbation.  She states that she has not had any wheezing.    She was evaluated by an ENT doctor for her cough and states that she had Demarian Epps barium swallow that revealed Allien Melberg hiatal hernia.  She was referred to GI but the appointment is not until much later this month.  She does take Nexium twice daily and Pepcid twice daily was added.  Despite this she has had no improvement in her cough.   She has seen pulmonary in the past and has tried numerous different  inhalers but has not noticed any improvement in her cough or her shortness of breath.  At this point, she is frustrated because she is not improving.  She states that sometimes she has bouts of coughing so severe that she begins to vomit.  She also states that the cough is much worse after she eats.   She was admitted and found to have abnormal chest CT with bilateral ground glass opacities peripherally.  Seen by pulm, concerned for rheumatoid related to rheumatoid vs tezpire.  Discharged on steroid taper.  Follow outpatient.  See below for additional details  Hospital Course:  Assessment and Plan:  Shortness of Breath  Bilateral Peripheral Ground Glass Opacities Discussed with pulmonology, they're recommending steroids for possible pneumonitis related either to tezpire or rheumatoid Follow pending anti CCP, positive RF  Will discharge with abx to complete course for possible pneumonia Negative RVP, negative covid.  Negative urine strep.  Urine legionella is pending. Inflammatory markers not notably elevated Follow up with pulmonology for repeat imaging outpatient  Elevated Troponin  Demand due to above  Elevated D dimer Negative w/u for VTE  GERD  Hiatal Hernia She takes Nexium twice daily and Pepcid twice daily-I have ordered these   Dysphagia Follow with GI outpatient for esophageal dysphagia Consider outpatient SLP  Gastroparesis cont Reglan   Obesity Body mass index is 31.81 kg/m.      Procedures: LE US Summary:  BILATERAL:  - No evidence of deep vein thrombosis seen in the lower extremities,  bilaterally.  -  No evidence of popliteal cyst, bilaterally.    Echo IMPRESSIONS     1. Left ventricular ejection fraction, by estimation, is 60 to 65%. The  left ventricle has normal function. The left ventricle has no regional  wall motion abnormalities. Left ventricular diastolic parameters were  normal.   2. Right ventricular systolic function is normal. The  right ventricular  size is normal.   3. The mitral valve is normal in structure. Trivial mitral valve  regurgitation. No evidence of mitral stenosis.   4. The aortic valve is tricuspid. Aortic valve regurgitation is not  visualized. No aortic stenosis is present.   5. The inferior vena cava is normal in size with greater than 50%  respiratory variability, suggesting right atrial pressure of 3 mmHg.   Comparison(s): No significant change from prior study.   Consultations: pulmonology  Discharge Exam: Vitals:   01/26/22 0841 01/26/22 1217  BP: (!) 141/90 (!) 143/82  Pulse: 74 69  Resp: 18 (!) 23  Temp: 98.7 F (37.1 C) 97.8 F (36.6 C)  SpO2: 97% 96%   Ok with discharge today Notes SOB with exertion, cough - worse over past4-6 weeks  General: No acute distress. Cardiovascular: Heart sounds show Chrissie Dacquisto regular rate, and rhythm.  Lungs: Clear to auscultation bilaterally  Abdomen: Soft, nontender, nondistended Neurological: Alert and oriented 3. Moves all extremities 4 with equal strength. Cranial nerves II through XII grossly intact. Extremities: No clubbing or cyanosis. No edema Discharge Instructions   Discharge Instructions     Call MD for:  difficulty breathing, headache or visual disturbances   Complete by: As directed    Call MD for:  extreme fatigue   Complete by: As directed    Call MD for:  hives   Complete by: As directed    Call MD for:  persistant dizziness or light-headedness   Complete by: As directed    Call MD for:  persistant nausea and vomiting   Complete by: As directed    Call MD for:  redness, tenderness, or signs of infection (pain, swelling, redness, odor or green/yellow discharge around incision site)   Complete by: As directed    Call MD for:  severe uncontrolled pain   Complete by: As directed    Call MD for:  temperature >100.4   Complete by: As directed    Diet - low sodium heart healthy   Complete by: As directed    Discharge instructions    Complete by: As directed    You were seen for shortness of breath and cough.  You had negative workup for blood clots.  Your ultrasound of your heart was unremarkable.  We had the lung doctors see you.  They're concerned for Lauris Keepers pneumonitis potentially related to the tezpire or rheumatoid pneumonitis.    You have some labs pending at discharge.  Follow the anti ccp with your outpatient doctors which is currently pending.  You also have some autoimmune labs pending from Dr. Dellis Anes as well.  I'll send you with antibiotics to complete Kimberlee Shoun course for pneumonia.  We'll send you with Tayden Nichelson long steroid taper over the next several weeks.  Dr. Delton Coombes will follow Chereese Cilento repeat CT scan after this.  You should follow your esophageal dysmotility with gastroenterology outpatient.  Speech recommended considering outpatient speech evaluation.  Return for new, recurrent, or worsening symptoms.  Please ask your PCP to request records from this hospitalization so they know what was done and what the next steps will be.  Increase activity slowly   Complete by: As directed       Allergies as of 01/26/2022       Reactions   Advair Diskus [fluticasone-salmeterol] Other (See Comments)   "dries my throat out really bad'   Flonase [fluticasone]    Nose bleeds   Lipitor [atorvastatin Calcium] Other (See Comments)   Elevations in blood pressure?        Medication List     TAKE these medications    albuterol 108 (90 Base) MCG/ACT inhaler Commonly known as: VENTOLIN HFA Inhale into the lungs.   albuterol 108 (90 Base) MCG/ACT inhaler Commonly known as: VENTOLIN HFA INHALE 1-2 PUFFS BY MOUTH EVERY 6 HOURS AS NEEDED FOR WHEEZE OR SHORTNESS OF BREATH   amoxicillin 500 MG tablet Commonly known as: AMOXIL Take 2 tablets (1,000 mg total) by mouth every 8 (eight) hours for 3 days.   ascorbic acid 500 MG tablet Commonly known as: VITAMIN C Take 500 mg by mouth daily.   azithromycin 500 MG  tablet Commonly known as: Zithromax Take 1 tablet (500 mg total) by mouth daily for 3 days. Take 1 tablet daily for 3 days.   clobetasol ointment 0.05 % Commonly known as: TEMOVATE Apply 1 Application topically 2 (two) times daily.   cyanocobalamin 1000 MCG tablet Commonly known as: VITAMIN B12 Take 1,000 mcg by mouth daily.   esomeprazole 40 MG capsule Commonly known as: NEXIUM TAKE 1 CAPSULE (40 MG TOTAL) BY MOUTH 2 (TWO) TIMES DAILY. FOR HEARTBURN.   famotidine 40 MG tablet Commonly known as: PEPCID TAKE 1 TABLET BY MOUTH TWICE Neils Siracusa DAY   FIBER PO Take 1 tablet by mouth daily as needed.   formoterol 20 MCG/2ML nebulizer solution Commonly known as: PERFOROMIST Take 2 mLs (20 mcg total) by nebulization 2 (two) times daily.   hydrOXYzine 50 MG capsule Commonly known as: VISTARIL TAKE 1 CAPSULE BY MOUTH ONCE OR TWICE DAILY AS NEEDED FOR ANXIETY   levocetirizine 5 MG tablet Commonly known as: XYZAL Take 1 tablet (5 mg total) by mouth in the morning and at bedtime.   metoCLOPramide 5 MG tablet Commonly known as: REGLAN Take 1 tablet (5 mg total) by mouth 4 (four) times daily.   multivitamin capsule Take 1 capsule by mouth daily.   predniSONE 10 MG tablet Commonly known as: DELTASONE Take 3 tablets (30 mg total) by mouth daily for 21 days, THEN 2 tablets (20 mg total) daily for 3 days, THEN 1 tablet (10 mg total) daily for 3 days. Start taking on: January 26, 2022   rosuvastatin 10 MG tablet Commonly known as: Crestor Take 1 tablet (10 mg total) by mouth daily. for cholesterol.   sucralfate 1 g tablet Commonly known as: Carafate Take 1 tablet (1 g total) by mouth in the morning, at noon, and at bedtime.   VITAMIN D3 PO Take 1,000 Units by mouth.   vitamin E 45 MG (100 UNITS) capsule Take 400 Units by mouth daily.       Allergies  Allergen Reactions   Advair Diskus [Fluticasone-Salmeterol] Other (See Comments)    "dries my throat out really bad'   Flonase  [Fluticasone]     Nose bleeds   Lipitor [Atorvastatin Calcium] Other (See Comments)    Elevations in blood pressure?      The results of significant diagnostics from this hospitalization (including imaging, microbiology, ancillary and laboratory) are listed below for reference.    Significant Diagnostic Studies: ECHOCARDIOGRAM COMPLETE  Result Date:  01/26/2022    ECHOCARDIOGRAM REPORT   Patient Name:   RACHELANN ENLOE Date of Exam: 01/26/2022 Medical Rec #:  [id number]           Height:       69.0 in Accession #:    [id number]          Weight:       215.4 lb Date of Birth:  12-25-1975           BSA:          2.132 m Patient Age:    46 years            BP:           123/69 mmHg Patient Gender: F                   HR:           65 bpm. Exam Location:  Inpatient Procedure: 2D Echo, Cardiac Doppler and Color Doppler Indications:    Dyspnea R06.00  History:        Patient has prior history of Echocardiogram examinations, most                 recent 05/31/2020. Signs/Symptoms:Dyspnea; Risk                 Factors:Hypertension, Sleep Apnea and Dyslipidemia.  Sonographer:    Lucendia Herrlich Referring Phys: [id number] SAIMA RIZWAN IMPRESSIONS  1. Left ventricular ejection fraction, by estimation, is 60 to 65%. The left ventricle has normal function. The left ventricle has no regional wall motion abnormalities. Left ventricular diastolic parameters were normal.  2. Right ventricular systolic function is normal. The right ventricular size is normal.  3. The mitral valve is normal in structure. Trivial mitral valve regurgitation. No evidence of mitral stenosis.  4. The aortic valve is tricuspid. Aortic valve regurgitation is not visualized. No aortic stenosis is present.  5. The inferior vena cava is normal in size with greater than 50% respiratory variability, suggesting right atrial pressure of 3 mmHg. Comparison(s): No significant change from prior study. FINDINGS  Left Ventricle: Left ventricular ejection fraction,  by estimation, is 60 to 65%. The left ventricle has normal function. The left ventricle has no regional wall motion abnormalities. The left ventricular internal cavity size was normal in size. There is  no left ventricular hypertrophy. Left ventricular diastolic parameters were normal. Right Ventricle: The right ventricular size is normal. Right ventricular systolic function is normal. Left Atrium: Left atrial size was normal in size. Right Atrium: Right atrial size was normal in size. Pericardium: There is no evidence of pericardial effusion. Mitral Valve: The mitral valve is normal in structure. Trivial mitral valve regurgitation. No evidence of mitral valve stenosis. Tricuspid Valve: The tricuspid valve is normal in structure. Tricuspid valve regurgitation is trivial. No evidence of tricuspid stenosis. Aortic Valve: The aortic valve is tricuspid. Aortic valve regurgitation is not visualized. No aortic stenosis is present. Aortic valve mean gradient measures 5.0 mmHg. Aortic valve peak gradient measures 9.5 mmHg. Aortic valve area, by VTI measures 2.68 cm. Pulmonic Valve: The pulmonic valve was normal in structure. Pulmonic valve regurgitation is trivial. No evidence of pulmonic stenosis. Aorta: The aortic root is normal in size and structure. Venous: The inferior vena cava is normal in size with greater than 50% respiratory variability, suggesting right atrial pressure of 3 mmHg. IAS/Shunts: No atrial level shunt detected by color flow Doppler.  LEFT VENTRICLE PLAX 2D LVIDd:  4.70 cm   Diastology LVIDs:         3.00 cm   LV e' medial:    6.31 cm/s LV PW:         1.00 cm   LV E/e' medial:  11.6 LV IVS:        1.10 cm   LV e' lateral:   9.36 cm/s LVOT diam:     2.00 cm   LV E/e' lateral: 7.8 LV SV:         87 LV SV Index:   41 LVOT Area:     3.14 cm                           3D Volume EF:                          3D EF:        60 %                          LV EDV:       138 ml                          LV  ESV:       55 ml                          LV SV:        83 ml RIGHT VENTRICLE            IVC RV S prime:     9.90 cm/s  IVC diam: 1.80 cm TAPSE (M-mode): 1.9 cm LEFT ATRIUM             Index        RIGHT ATRIUM           Index LA diam:        3.90 cm 1.83 cm/m   RA Area:     17.30 cm LA Vol (A2C):   37.1 ml 17.38 ml/m  RA Volume:   42.50 ml  19.93 ml/m LA Vol (A4C):   36.4 ml 17.07 ml/m LA Biplane Vol: 41.1 ml 19.28 ml/m  AORTIC VALVE AV Area (Vmax):    2.45 cm AV Area (Vmean):   2.48 cm AV Area (VTI):     2.68 cm AV Vmax:           154.00 cm/s AV Vmean:          101.000 cm/s AV VTI:            0.325 m AV Peak Grad:      9.5 mmHg AV Mean Grad:      5.0 mmHg LVOT Vmax:         120.00 cm/s LVOT Vmean:        79.800 cm/s LVOT VTI:          0.277 m LVOT/AV VTI ratio: 0.85  AORTA Ao Root diam: 3.10 cm Ao Asc diam:  3.40 cm MITRAL VALVE MV Area (PHT): 3.21 cm    SHUNTS MV Decel Time: 236 msec    Systemic VTI:  0.28 m MV E velocity: 72.90 cm/s  Systemic Diam: 2.00 cm MV Vasco Chong velocity: 72.90 cm/s MV E/Trueman Worlds ratio:  1.00 Olga Millers MD Electronically signed by Olga Millers MD Signature Date/Time: 01/26/2022/12:39:27 PM  Final    VAS [name] LOWER EXTREMITY VENOUS (DVT)  Result Date: 01/26/2022  Lower Venous DVT Study Patient Name:  LYRIKA SOUDERS  Date of Exam:   01/26/2022 Medical Rec #: [id number]            Accession #:    [id number] Date of Birth: 20-Dec-1975            Patient Gender: F Patient Age:   46 years Exam Location:  [facility] Procedure:      VAS [name] LOWER EXTREMITY VENOUS (DVT) Referring Phys: Arnez Stoneking POWELL JR --------------------------------------------------------------------------------  Indications: Positive ddimer.  Comparison Study: no prior Performing Technologist: Argentina Ponder RVS  Examination Guidelines: Graycen Degan complete evaluation includes B-mode imaging, spectral Doppler, color Doppler, and power Doppler as needed of all accessible portions of each vessel. Bilateral testing is considered  an integral part of Mahrosh Donnell complete examination. Limited examinations for reoccurring indications may be performed as noted. The reflux portion of the exam is performed with the patient in reverse Trendelenburg.  +---------+---------------+---------+-----------+----------+--------------+ RIGHT    CompressibilityPhasicitySpontaneityPropertiesThrombus Aging +---------+---------------+---------+-----------+----------+--------------+ CFV      Full           Yes      Yes                                 +---------+---------------+---------+-----------+----------+--------------+ SFJ      Full                                                        +---------+---------------+---------+-----------+----------+--------------+ FV Prox  Full                                                        +---------+---------------+---------+-----------+----------+--------------+ FV Mid   Full                                                        +---------+---------------+---------+-----------+----------+--------------+ FV DistalFull           Yes      Yes                                 +---------+---------------+---------+-----------+----------+--------------+ PFV      Full                                                        +---------+---------------+---------+-----------+----------+--------------+ POP      Full           Yes      Yes                                 +---------+---------------+---------+-----------+----------+--------------+ PTV  Full                                                        +---------+---------------+---------+-----------+----------+--------------+ PERO     Full                                                        +---------+---------------+---------+-----------+----------+--------------+   +---------+---------------+---------+-----------+----------+--------------+ LEFT      CompressibilityPhasicitySpontaneityPropertiesThrombus Aging +---------+---------------+---------+-----------+----------+--------------+ CFV      Full           Yes      Yes                                 +---------+---------------+---------+-----------+----------+--------------+ SFJ      Full                                                        +---------+---------------+---------+-----------+----------+--------------+ FV Prox  Full                                                        +---------+---------------+---------+-----------+----------+--------------+ FV Mid   Full                                                        +---------+---------------+---------+-----------+----------+--------------+ FV DistalFull                                                        +---------+---------------+---------+-----------+----------+--------------+ PFV      Full                                                        +---------+---------------+---------+-----------+----------+--------------+ POP      Full           Yes      Yes                                 +---------+---------------+---------+-----------+----------+--------------+ PTV      Full                                                        +---------+---------------+---------+-----------+----------+--------------+  PERO     Full           Yes      Yes                                 +---------+---------------+---------+-----------+----------+--------------+     Summary: BILATERAL: - No evidence of deep vein thrombosis seen in the lower extremities, bilaterally. -No evidence of popliteal cyst, bilaterally.   *See table(s) above for measurements and observations.    Preliminary    CT Angio Chest PE W and/or Wo Contrast  Result Date: 01/25/2022 CLINICAL DATA:  Shortness of breath, chest pain, concern for pulmonary embolism. EXAM: CT ANGIOGRAPHY CHEST WITH CONTRAST TECHNIQUE:  Multidetector CT imaging of the chest was performed using the standard protocol during bolus administration of intravenous contrast. Multiplanar CT image reconstructions and MIPs were obtained to evaluate the vascular anatomy. RADIATION DOSE REDUCTION: This exam was performed according to the departmental dose-optimization program which includes automated exposure control, adjustment of the mA and/or kV according to patient size and/or use of iterative reconstruction technique. CONTRAST:  20mL OMNIPAQUE IOHEXOL 350 MG/ML SOLN COMPARISON:  Chest CT dated 04/07/2021. FINDINGS: Cardiovascular: Satisfactory opacification of the pulmonary arteries to the segmental level. No evidence of pulmonary embolism. The heart is mildly enlarged. No pericardial effusion. Mediastinum/Nodes: No enlarged mediastinal, hilar, or axillary lymph nodes. Thyroid gland, trachea, and esophagus demonstrate no significant findings. Lungs/Pleura: Moderate bilateral peripheral predominant ground-glass opacities are noted. There is no pleural effusion or pneumothorax. Upper Abdomen: There is Loren Sawaya small sliding hiatal hernia. Musculoskeletal: No chest wall abnormality. No acute or significant osseous findings. Review of the MIP images confirms the above findings. IMPRESSION: 1. No evidence of pulmonary embolism. 2. Moderate bilateral peripheral predominant ground-glass opacities, likely representing pneumonia. Electronically Signed   By: Romona Curls M.D.   On: 01/25/2022 20:12    Microbiology: Recent Results (from the past 240 hour(s))  Respiratory (~20 pathogens) panel by PCR     Status: None   Collection Time: 01/26/22  1:33 AM   Specimen: Nasopharyngeal Swab; Respiratory  Result Value Ref Range Status   Adenovirus NOT DETECTED NOT DETECTED Final   Coronavirus 229E NOT DETECTED NOT DETECTED Final    Comment: (NOTE) The Coronavirus on the Respiratory Panel, DOES NOT test for the novel  Coronavirus (2019 nCoV)    Coronavirus HKU1 NOT  DETECTED NOT DETECTED Final   Coronavirus NL63 NOT DETECTED NOT DETECTED Final   Coronavirus OC43 NOT DETECTED NOT DETECTED Final   Metapneumovirus NOT DETECTED NOT DETECTED Final   Rhinovirus / Enterovirus NOT DETECTED NOT DETECTED Final   Influenza [place] NOT DETECTED NOT DETECTED Final   Influenza B NOT DETECTED NOT DETECTED Final   Parainfluenza Virus 1 NOT DETECTED NOT DETECTED Final   Parainfluenza Virus 2 NOT DETECTED NOT DETECTED Final   Parainfluenza Virus 3 NOT DETECTED NOT DETECTED Final   Parainfluenza Virus 4 NOT DETECTED NOT DETECTED Final   Respiratory Syncytial Virus NOT DETECTED NOT DETECTED Final   Bordetella pertussis NOT DETECTED NOT DETECTED Final   Bordetella Parapertussis NOT DETECTED NOT DETECTED Final   Chlamydophila pneumoniae NOT DETECTED NOT DETECTED Final   Mycoplasma pneumoniae NOT DETECTED NOT DETECTED Final    Comment: Performed at [facility], [address] [place], [place] [address]  SARS Coronavirus 2 by RT PCR (hospital order, performed in [facility] hospital lab) *cepheid single result test* Anterior Nasal Swab  Status: None   Collection Time: 01/26/22  1:33 AM   Specimen: Anterior Nasal Swab  Result Value Ref Range Status   SARS Coronavirus 2 by RT PCR NEGATIVE NEGATIVE Final    Comment: (NOTE) SARS-CoV-2 target nucleic acids are NOT DETECTED.  The SARS-CoV-2 RNA is generally detectable in upper and lower respiratory specimens during the acute phase of infection. The lowest concentration of SARS-CoV-2 viral copies this assay can detect is 250 copies / mL. Willella Harding negative result does not preclude SARS-CoV-2 infection and should not be used as the sole basis for treatment or other patient management decisions.  Kendre Jacinto negative result may occur with improper specimen collection / handling, submission of specimen other than nasopharyngeal swab, presence of viral mutation(s) within the areas targeted by this assay, and inadequate number of viral  copies (<250 copies / mL). Dezirae Service negative result must be combined with clinical observations, patient history, and epidemiological information.  Fact Sheet for Patients:   RoadLapTop.co.za  Fact Sheet for Healthcare Providers: http://kim-miller.com/  This test is not yet approved or  cleared by the [place] FDA and has been authorized for detection and/or diagnosis of SARS-CoV-2 by FDA under an Emergency Use Authorization (EUA).  This EUA will remain in effect (meaning this test can be used) for the duration of the COVID-19 declaration under Section 564(b)(1) of the Act, 21 U.S.C. section 360bbb-3(b)(1), unless the authorization is terminated or revoked sooner.  Performed at [facility], [address], [place], [place] [id number]      Labs: Basic Metabolic Panel: Recent Labs  Lab 01/25/22 1828  NA 135  K 3.9  CL 100  CO2 26  GLUCOSE 88  BUN 17  CREATININE 0.79  CALCIUM 9.7   Liver Function Tests: Recent Labs  Lab 01/25/22 1828  AST 32  ALT 18  ALKPHOS 68  BILITOT 0.8  PROT 8.4*  ALBUMIN 4.6   No results for input(s): "LIPASE", "AMYLASE" in the last 168 hours. No results for input(s): "AMMONIA" in the last 168 hours. CBC: Recent Labs  Lab 01/25/22 1828  WBC 8.7  HGB 13.8  HCT 40.3  MCV 84.7  PLT 235   Cardiac Enzymes: No results for input(s): "CKTOTAL", "CKMB", "CKMBINDEX", "TROPONINI" in the last 168 hours. BNP: BNP (last 3 results) Recent Labs    01/25/22 1828  BNP 19.1    ProBNP (last 3 results) No results for input(s): "PROBNP" in the last 8760 hours.  CBG: No results for input(s): "GLUCAP" in the last 168 hours.     Signed:  Lacretia Nicks MD.  [facility] 01/26/2022, 3:05 PM

## 2022-01-26 NOTE — Progress Notes (Signed)
Echocardiogram 2D Echocardiogram has been performed.  Hannah Klein 01/26/2022, 12:22 PM

## 2022-01-26 NOTE — H&P (Signed)
History and Physical    Hannah Klein  GGE:[id number]  DOB: 1975/07/25  DOA: 01/25/2022 PCP: Doreene Nest, NP   Patient coming from: home  Chief Complaint: sent for elevated d dimer  HPI: Hannah Klein is a 46 y.o. female with medical history of asthma, gastroesophageal reflux disease, hiatal hernia who has had a cough and shortness of breath for 4 to 6 weeks and is sent to the ED for an elevated D-dimer.  The patient states that her D-dimer was checked due to ongoing cough and shortness of breath.  She was evaluated at the med center on [facility].  CTA of the chest did not reveal a PE but did reveal groundglass infiltrates.  She was diagnosed with pneumonia and admitted for further treatment.  Further history, the patient states that she has been diagnosed with asthma her current cough and shortness of breath do not feel like an asthma exacerbation.  She states that she has not had any wheezing.    She was evaluated by an ENT doctor for her cough and states that she had a barium swallow that revealed a hiatal hernia.  She was referred to GI but the appointment is not until much later this month.  She does take Nexium twice daily and Pepcid twice daily was added.  Despite this she has had no improvement in her cough.  She has seen pulmonary in the past and has tried numerous different inhalers but has not noticed any improvement in her cough or her shortness of breath.  At this point, she is frustrated because she is not improving.  She states that sometimes she has bouts of coughing so severe that she begins to vomit.  She also states that the cough is much worse after she eats.  ED Course: Given ceftriaxone and azithromycin  Review of Systems:  All other systems reviewed and apart from HPI, are negative.  Past Medical History:  Diagnosis Date   Arthritis    Asthma    Environmental allergies    Generalized anxiety disorder    GERD (gastroesophageal reflux  disease)    Hx of colonic polyps    Hypertension    Migraines    MRSA (methicillin resistant Staphylococcus aureus)    Lt upper arm  approx 8 years ago    Past Surgical History:  Procedure Laterality Date   CHOLECYSTECTOMY     DILATION AND CURETTAGE OF UTERUS     NECK SURGERY     Fusion to C6 and C7   TYMPANOSTOMY TUBE PLACEMENT     WRIST ARTHROSCOPY WITH DEBRIDEMENT Left 02/27/2018   Procedure: WRIST ARTHROSCOPY WITH DEBRIDEMENT;  Surgeon: Betha Loa, MD;  Location: [facility];  Service: Orthopedics;  Laterality: Left;    Social History:   reports that she quit smoking about 14 years ago. Her smoking use included cigarettes. She started smoking about 30 years ago. She has a 36.00 pack-year smoking history. She has been exposed to tobacco smoke. She has never used smokeless tobacco. She reports that she does not drink alcohol and does not use drugs.  Allergies  Allergen Reactions   Advair Diskus [Fluticasone-Salmeterol] Other (See Comments)    "dries my throat out really bad'   Flonase [Fluticasone]     Nose bleeds   Lipitor [Atorvastatin Calcium] Other (See Comments)    Elevations in blood pressure?    Family History  Problem Relation Age of Onset   Eczema Mother    Allergic rhinitis  Brother    Asthma Brother    Asthma Brother    Allergic rhinitis Brother    Urticaria Other    Angioedema Other      Prior to Admission medications   Medication Sig Start Date End Date Taking? Authorizing Provider  albuterol (VENTOLIN HFA) 108 (90 Base) MCG/ACT inhaler INHALE 1-2 PUFFS BY MOUTH EVERY 6 HOURS AS NEEDED FOR WHEEZE OR SHORTNESS OF BREATH 10/31/21   Alfonse SpruceGallagher, Joel Louis, MD  albuterol (VENTOLIN HFA) 108 (90 Base) MCG/ACT inhaler Inhale into the lungs.    [provider]  Cholecalciferol (VITAMIN D3 PO) Take 1,000 Units by mouth.    [provider]  clobetasol ointment (TEMOVATE) 0.05 % Apply 1 Application topically 2 (two) times daily.  01/25/22   Alfonse SpruceGallagher, Joel Louis, MD  esomeprazole (NEXIUM) 40 MG capsule TAKE 1 CAPSULE (40 MG TOTAL) BY MOUTH 2 (TWO) TIMES DAILY. FOR HEARTBURN. 09/12/21   Doreene Nestlark, Katherine K, NP  famotidine (PEPCID) 40 MG tablet Take 1 tablet (40 mg total) by mouth 2 (two) times daily. 08/03/21   Alfonse SpruceGallagher, Joel Louis, MD  FIBER PO Take 1 tablet by mouth daily as needed.    [provider]  formoterol (PERFOROMIST) 20 MCG/2ML nebulizer solution Take 2 mLs (20 mcg total) by nebulization 2 (two) times daily. 01/25/22 02/24/22  Alfonse SpruceGallagher, Joel Louis, MD  hydrOXYzine (VISTARIL) 50 MG capsule TAKE 1 CAPSULE BY MOUTH ONCE OR TWICE DAILY AS NEEDED FOR ANXIETY 08/06/21   Doreene Nestlark, Katherine K, NP  levocetirizine (XYZAL) 5 MG tablet Take 1 tablet (5 mg total) by mouth in the morning and at bedtime. 01/25/22 04/25/22  Alfonse SpruceGallagher, Joel Louis, MD  metoCLOPramide (REGLAN) 5 MG tablet Take 1 tablet (5 mg total) by mouth 4 (four) times daily. 01/25/22 02/24/22  Alfonse SpruceGallagher, Joel Louis, MD  Multiple Vitamin (MULTIVITAMIN) capsule Take 1 capsule by mouth daily.    [provider]  rosuvastatin (CRESTOR) 10 MG tablet Take 1 tablet (10 mg total) by mouth daily. for cholesterol. 06/12/21   Doreene Nestlark, Katherine K, NP  sucralfate (CARAFATE) 1 g tablet Take 1 tablet (1 g total) by mouth in the morning, at noon, and at bedtime. 01/25/22   Alfonse SpruceGallagher, Joel Louis, MD  vitamin B-12 (CYANOCOBALAMIN) 1000 MCG tablet Take 1,000 mcg by mouth daily.    [provider]  vitamin C (ASCORBIC ACID) 500 MG tablet Take 500 mg by mouth daily.    [provider]  vitamin E 100 UNIT capsule Take 400 Units by mouth daily.    [provider]    Physical Exam: Wt Readings from Last 3 Encounters:  01/25/22 97.7 kg  01/25/22 100.7 kg  09/21/21 101.1 kg   Vitals:   01/25/22 2100 01/25/22 2215 01/25/22 2300 01/26/22 0352  BP:  (!) 142/92 (!) 145/87 123/69  Pulse:  73 73 70  Resp:  20 18 16   Temp: 98 F (36.7 C)  (!) 97.5  F (36.4 C) 98 F (36.7 C)  TempSrc: Oral  Oral Oral  SpO2:  98% 100% 95%  Weight:   97.7 kg   Height:   5\' 9"  (1.753 m)       Constitutional:  Calm & comfortable Eyes: PERRLA, lids and conjunctivae normal ENT:  Mucous membranes are moist.  Pharynx clear of exudate   Normal dentition.  Respiratory:  Clear to auscultation bilaterally - has a dry cough. Normal respiratory effort.  Cardiovascular:  S1 & S2 heard, regular rate and rhythm No Murmurs Abdomen:  Non distended  No tenderness, No masses Bowel sounds normal Extremities:  No clubbing / cyanosis No pedal edema  Skin:  No rashes, lesions or ulcers Neurologic:  AAO x 3 CN 2-12 grossly intact Sensation intact Strength 5/5 in all 4 extremities Psychiatric:  Normal Mood and affect    Labs on Admission: I have personally reviewed following labs and imaging studies  CBC: Recent Labs  Lab 01/25/22 1828  WBC 8.7  HGB 13.8  HCT 40.3  MCV 84.7  PLT 235   Basic Metabolic Panel: Recent Labs  Lab 01/25/22 1828  NA 135  K 3.9  CL 100  CO2 26  GLUCOSE 88  BUN 17  CREATININE 0.79  CALCIUM 9.7   GFR: Estimated Creatinine Clearance: 108.1 mL/min (by C-G formula based on SCr of 0.79 mg/dL). Liver Function Tests: Recent Labs  Lab 01/25/22 1828  AST 32  ALT 18  ALKPHOS 68  BILITOT 0.8  PROT 8.4*  ALBUMIN 4.6   No results for input(s): "LIPASE", "AMYLASE" in the last 168 hours. No results for input(s): "AMMONIA" in the last 168 hours. Coagulation Profile: No results for input(s): "INR", "PROTIME" in the last 168 hours. Cardiac Enzymes: No results for input(s): "CKTOTAL", "CKMB", "CKMBINDEX", "TROPONINI" in the last 168 hours. BNP (last 3 results) No results for input(s): "PROBNP" in the last 8760 hours. HbA1C: No results for input(s): "HGBA1C" in the last 72 hours. CBG: No results for input(s): "GLUCAP" in the last 168 hours. Lipid Profile: No results for input(s): "CHOL", "HDL", "LDLCALC",  "TRIG", "CHOLHDL", "LDLDIRECT" in the last 72 hours. Thyroid Function Tests: No results for input(s): "TSH", "T4TOTAL", "FREET4", "T3FREE", "THYROIDAB" in the last 72 hours. Anemia Panel: No results for input(s): "VITAMINB12", "FOLATE", "FERRITIN", "TIBC", "IRON", "RETICCTPCT" in the last 72 hours. Urine analysis: No results found for: "COLORURINE", "APPEARANCEUR", "LABSPEC", "PHURINE", "GLUCOSEU", "HGBUR", "BILIRUBINUR", "KETONESUR", "PROTEINUR", "UROBILINOGEN", "NITRITE", "LEUKOCYTESUR" Sepsis Labs: (procalcitonin:4,lacticidven:4) ) Recent Results (from the past 240 hour(s))  Respiratory (~20 pathogens) panel by PCR     Status: None   Collection Time: 01/26/22  1:33 AM   Specimen: Nasopharyngeal Swab; Respiratory  Result Value Ref Range Status   Adenovirus NOT DETECTED NOT DETECTED Final   Coronavirus 229E NOT DETECTED NOT DETECTED Final    Comment: (NOTE) The Coronavirus on the Respiratory Panel, DOES NOT test for the novel  Coronavirus (2019 nCoV)    Coronavirus HKU1 NOT DETECTED NOT DETECTED Final   Coronavirus NL63 NOT DETECTED NOT DETECTED Final   Coronavirus OC43 NOT DETECTED NOT DETECTED Final   Metapneumovirus NOT DETECTED NOT DETECTED Final   Rhinovirus / Enterovirus NOT DETECTED NOT DETECTED Final   Influenza A NOT DETECTED NOT DETECTED Final   Influenza B NOT DETECTED NOT DETECTED Final   Parainfluenza Virus 1 NOT DETECTED NOT DETECTED Final   Parainfluenza Virus 2 NOT DETECTED NOT DETECTED Final   Parainfluenza Virus 3 NOT DETECTED NOT DETECTED Final   Parainfluenza Virus 4 NOT DETECTED NOT DETECTED Final   Respiratory Syncytial Virus NOT DETECTED NOT DETECTED Final   Bordetella pertussis NOT DETECTED NOT DETECTED Final   Bordetella Parapertussis NOT DETECTED NOT DETECTED Final   Chlamydophila pneumoniae NOT DETECTED NOT DETECTED Final   Mycoplasma pneumoniae NOT DETECTED NOT DETECTED Final    Comment: Performed at [facility], [address]  [place], [place] [address]  SARS Coronavirus 2 by RT PCR (hospital order, performed in [facility] hospital lab) *cepheid single result test* Anterior Nasal Swab     Status: None  Collection Time: 01/26/22  1:33 AM   Specimen: Anterior Nasal Swab  Result Value Ref Range Status   SARS Coronavirus 2 by RT PCR NEGATIVE NEGATIVE Final    Comment: (NOTE) SARS-CoV-2 target nucleic acids are NOT DETECTED.  The SARS-CoV-2 RNA is generally detectable in upper and lower respiratory specimens during the acute phase of infection. The lowest concentration of SARS-CoV-2 viral copies this assay can detect is 250 copies / mL. A negative result does not preclude SARS-CoV-2 infection and should not be used as the sole basis for treatment or other patient management decisions.  A negative result may occur with improper specimen collection / handling, submission of specimen other than nasopharyngeal swab, presence of viral mutation(s) within the areas targeted by this assay, and inadequate number of viral copies (<250 copies / mL). A negative result must be combined with clinical observations, patient history, and epidemiological information.  Fact Sheet for Patients:   RoadLapTop.co.za  Fact Sheet for Healthcare Providers: http://kim-miller.com/  This test is not yet approved or  cleared by the [place] FDA and has been authorized for detection and/or diagnosis of SARS-CoV-2 by FDA under an Emergency Use Authorization (EUA).  This EUA will remain in effect (meaning this test can be used) for the duration of the COVID-19 declaration under Section 564(b)(1) of the Act, 21 U.S.C. section 360bbb-3(b)(1), unless the authorization is terminated or revoked sooner.  Performed at [facility], [address], [place], [place] [address]      Radiological Exams on Admission: CT Angio Chest PE W and/or Wo Contrast  Result Date: 01/25/2022 CLINICAL DATA:   Shortness of breath, chest pain, concern for pulmonary embolism. EXAM: CT ANGIOGRAPHY CHEST WITH CONTRAST TECHNIQUE: Multidetector CT imaging of the chest was performed using the standard protocol during bolus administration of intravenous contrast. Multiplanar CT image reconstructions and MIPs were obtained to evaluate the vascular anatomy. RADIATION DOSE REDUCTION: This exam was performed according to the departmental dose-optimization program which includes automated exposure control, adjustment of the mA and/or kV according to patient size and/or use of iterative reconstruction technique. CONTRAST:  1mL OMNIPAQUE IOHEXOL 350 MG/ML SOLN COMPARISON:  Chest CT dated 04/07/2021. FINDINGS: Cardiovascular: Satisfactory opacification of the pulmonary arteries to the segmental level. No evidence of pulmonary embolism. The heart is mildly enlarged. No pericardial effusion. Mediastinum/Nodes: No enlarged mediastinal, hilar, or axillary lymph nodes. Thyroid gland, trachea, and esophagus demonstrate no significant findings. Lungs/Pleura: Moderate bilateral peripheral predominant ground-glass opacities are noted. There is no pleural effusion or pneumothorax. Upper Abdomen: There is a small sliding hiatal hernia. Musculoskeletal: No chest wall abnormality. No acute or significant osseous findings. Review of the MIP images confirms the above findings. IMPRESSION: 1. No evidence of pulmonary embolism. 2. Moderate bilateral peripheral predominant ground-glass opacities, likely representing pneumonia. Electronically Signed   By: Romona Curls M.D.   On: 01/25/2022 20:12    EKG: Independently reviewed. NSR  Assessment/Plan Principal Problem:   Cough- history of asthma - She states that her current cough and shortness of breath do not feel like they are asthma related -CTA of the chest reveals groundglass infiltrates - We will obtain a 2D echo to rule out underlying heart failure-she does have mildly elevated troponin  which could be suggestive of an underlying cardiac etiology for her symptoms - Resp panel is negative - The patient has also asked for a pulmonary evaluation while she is here in the hospital  Active Problems:   GERD (gastroesophageal reflux disease)/hiatal hernia -  She takes Nexium twice daily and Pepcid twice daily-I have ordered these  Gastroparesis - cont Reglan  Obesity Body mass index is 31.81 kg/m.     DVT prophylaxis: SCD  Code Status: Full code  Consults called: none  Admission status:  Level of care: Telemetry Medical  Calvert Cantor MD [facility]    01/26/2022, 4:31 AM

## 2022-01-27 ENCOUNTER — Other Ambulatory Visit (HOSPITAL_COMMUNITY): Payer: Self-pay

## 2022-01-27 LAB — ANTINUCLEAR ANTIBODIES, IFA: ANA Titer 1: NEGATIVE

## 2022-01-27 LAB — CYCLIC CITRUL PEPTIDE ANTIBODY, IGG/IGA: CCP Antibodies IgG/IgA: <1 units (ref 0–19)

## 2022-01-27 LAB — ANGIOTENSIN CONVERTING ENZYME: Angio Convert Enzyme: 24 U/L (ref 14–82)

## 2022-01-27 LAB — C-REACTIVE PROTEIN: CRP: 10 mg/L (ref 0–10)

## 2022-01-27 LAB — SEDIMENTATION RATE: Sed Rate: 26 mm/hr (ref 0–32)

## 2022-01-27 LAB — RHEUMATOID FACTOR: Rheumatoid fact SerPl-aCnc: 40.7 IU/mL — ABNORMAL HIGH (ref <14.0)

## 2022-01-29 ENCOUNTER — Telehealth: Payer: Self-pay | Admitting: Emergency Medicine

## 2022-01-29 ENCOUNTER — Telehealth: Payer: Self-pay

## 2022-01-29 DIAGNOSIS — J984 Other disorders of lung: Secondary | ICD-10-CM

## 2022-01-29 LAB — LEGIONELLA PNEUMOPHILA SEROGP 1 UR AG: L. pneumophila Serogp 1 Ur Ag: NEGATIVE

## 2022-01-29 NOTE — Telephone Encounter (Signed)
Transition Care Management Follow-up Telephone Call Date of discharge and from where: TCM [facility] 01-26-22 Dx: [facility]  How have you been since you were released from the hospital? About the same  Any questions or concerns? No  Items Reviewed: Did the pt receive and understand the discharge instructions provided? Yes  Medications obtained and verified? Yes  Other? No  Any new allergies since your discharge? No  Dietary orders reviewed? Yes Do you have support at home? Yes   Home Care and Equipment/Supplies: Were home health services ordered? no If so, what is the name of the agency? na  Has the agency set up a time to come to the patient's home? not applicable Were any new equipment or medical supplies ordered?  No What is the name of the medical supply agency? na Were you able to get the supplies/equipment? not applicable Do you have any questions related to the use of the equipment or supplies? No  Functional Questionnaire: (I = Independent and D = Dependent) ADLs: I  Bathing/Dressing- I  Meal Prep- I  Eating- I  Maintaining continence- I  Transferring/Ambulation- I  Managing Meds- I  Follow up appointments reviewed:  PCP Hospital f/u appt confirmed? No  Pt is following pulmonology - she does not feel that she needs to see PCP  Specialist Hospital f/u appt confirmed? Yes  Pt waiting to hear back from Dr Delton Coombes office on fu appt. Are transportation arrangements needed? No  If their condition worsens, is the pt aware to call PCP or go to the Emergency Dept.? Yes Was the patient provided with contact information for the PCP's office or ED? Yes Was to pt encouraged to call back with questions or concerns? Yes   Woodfin Ganja LPN [facility] Nurse Health Advisor Direct Dial [phone]

## 2022-01-30 NOTE — Telephone Encounter (Signed)
Routing to [employer]	[place] [employer]

## 2022-01-30 NOTE — Telephone Encounter (Signed)
Spoke with pt and ordered CT which needs to be done by 02/28/21. Pt scheduled for OV on 02/28/21. Nothing further needed at this time.

## 2022-01-31 ENCOUNTER — Other Ambulatory Visit: Payer: Commercial Managed Care - HMO

## 2022-02-07 ENCOUNTER — Ambulatory Visit: Payer: Commercial Managed Care - HMO

## 2022-02-20 ENCOUNTER — Ambulatory Visit (INDEPENDENT_AMBULATORY_CARE_PROVIDER_SITE_OTHER): Payer: Commercial Managed Care - HMO | Admitting: Internal Medicine

## 2022-02-20 ENCOUNTER — Encounter: Payer: Self-pay | Admitting: Internal Medicine

## 2022-02-20 VITALS — BP 132/68 | HR 81 | Ht 69.0 in | Wt 215.5 lb

## 2022-02-20 DIAGNOSIS — Z1211 Encounter for screening for malignant neoplasm of colon: Secondary | ICD-10-CM | POA: Diagnosis not present

## 2022-02-20 DIAGNOSIS — K219 Gastro-esophageal reflux disease without esophagitis: Secondary | ICD-10-CM

## 2022-02-20 DIAGNOSIS — Z800 Family history of malignant neoplasm of digestive organs: Secondary | ICD-10-CM | POA: Diagnosis not present

## 2022-02-20 NOTE — Progress Notes (Signed)
HISTORY OF PRESENT ILLNESS:  Hannah Klein is a 46 y.o. female, daughter of Hannah Klein, who is sent to GI regarding history of GERD and abnormal swallowing study.  Also, she is interested in follow-up screening colonoscopy due to a history of colon cancer in her grandfather.  The patient's current history is that of difficulty with breathing.  She has had similar problems over the years related to asthma.  However, she feels this is different.  She has had workups by a number of specialists including pulmonologist, allergist, ear nose and throat specialist, and speech pathologist.  She did have a swallowing study which revealed or suggested esophageal dysmotility, reflux, and a small hiatal hernia.  She tells me that she does have a history of classic reflux symptoms which are controlled with PPI.  Due to recent symptoms she is currently on Nexium 40 mg twice daily and famotidine 40 mg twice daily.  She is taking sucralfate twice daily.  She is not taking metoclopramide.  None of this has resulted in improvement in her breathing.  She did go to the emergency room January 25, 2022 with complaints of chest discomfort and difficulty breathing and elevated D-dimer.  CTA of the chest was negative for pulmonary embolism.  However, she was found to have groundglass abnormalities in the lungs suggesting pneumonia.  Possibly atypical.  She is now being followed by pulmonary regarding this issue.  Cardiac echo was unremarkable.  She has changed her diet in an effort to lose weight.  She is lost about 5 pounds.  Blood work reviewed.  CBC from November 30 was normal.  Hemoglobin 13.8.  Again, no complaints of reflux on medication.  No dysphagia.  No abdominal pain.  She tells me that she underwent upper endoscopy and colonoscopy in 2011 while living in [place].  She does not recall any particular abnormalities.  Grandfather had colon cancer.  She does have bowel habits that alternate at times.  She also  has occasional issues with hemorrhoids.  REVIEW OF SYSTEMS:  All non-GI ROS negative as otherwise stated in the HPI except for arthritis  Past Medical History:  Diagnosis Date   Arthritis    Asthma    Environmental allergies    Generalized anxiety disorder    GERD (gastroesophageal reflux disease)    Hx of colonic polyps    Hypertension    Migraines    MRSA (methicillin resistant Staphylococcus aureus)    Lt upper arm  approx 8 years ago    Past Surgical History:  Procedure Laterality Date   CHOLECYSTECTOMY     DILATION AND CURETTAGE OF UTERUS     NECK SURGERY     Fusion to C6 and C7   TYMPANOSTOMY TUBE PLACEMENT     WRIST ARTHROSCOPY WITH DEBRIDEMENT Left 02/27/2018   Procedure: WRIST ARTHROSCOPY WITH DEBRIDEMENT;  Surgeon: Leanora Cover, MD;  Location: [place];  Service: Orthopedics;  Laterality: Left;    Social History Hannah Klein  reports that she quit smoking about 14 years ago. Her smoking use included cigarettes. She started smoking about 31 years ago. She has a 36.00 pack-year smoking history. She has been exposed to tobacco smoke. She has never used smokeless tobacco. She reports that she does not drink alcohol and does not use drugs.  family history includes Allergic rhinitis in her brother and brother; Angioedema in an other family member; Asthma in her brother and brother; Eczema in her mother; Urticaria in an other family  member.  Allergies  Allergen Reactions   Advair Diskus [Fluticasone-Salmeterol] Other (See Comments)    "dries my throat out really bad'   Flonase [Fluticasone]     Nose bleeds   Lipitor [Atorvastatin Calcium] Other (See Comments)    Elevations in blood pressure?       PHYSICAL EXAMINATION: Vital signs: BP 132/68   Pulse 81   Ht _0  (1.753 m)   Wt 215 lb 8 oz (97.8 kg)   LMP 01/18/2022   BMI 31.82 kg/m   Constitutional: generally well-appearing, no acute distress Psychiatric: alert and oriented x 3,  cooperative Eyes: extraocular movements intact, anicteric, conjunctiva pink Mouth: oral pharynx moist, no lesions Neck: supple no lymphadenopathy Cardiovascular: heart regular rate and rhythm, no murmur Lungs: clear to auscultation bilaterally Abdomen: soft, nontender, nondistended, no obvious ascites, no peritoneal signs, normal bowel sounds, no organomegaly Rectal: Deferred until colonoscopy Extremities: no clubbing, sinus, or lower extremity edema bilaterally Skin: no lesions on visible extremities Neuro: No focal deficits.  Cranial nerves intact  ASSESSMENT:  1.  GERD.  No active symptoms on PPI 2.  Problems with difficulty breathing.  Not felt to be GI 3.  Recent swallowing study as described 4.  Family history of colon cancer in her grandfather.  Last colonoscopy 2011.  Due for follow-up 5.  Occasional problems with hemorrhoids.  To be evaluated   PLAN:  1.  Reflux precautions. 2.  Continue PPI.  Lowest dose to control reflux symptoms recommended. 3.  Schedule upper endoscopy.The nature of the procedure, as well as the risks, benefits, and alternatives were carefully and thoroughly reviewed with the patient. Ample time for discussion and questions allowed. The patient understood, was satisfied, and agreed to proceed. 4.  Schedule follow-up screening colonoscopy.The nature of the procedure, as well as the risks, benefits, and alternatives were carefully and thoroughly reviewed with the patient. Ample time for discussion and questions allowed. The patient understood, was satisfied, and agreed to proceed. 5.  I recommend that the patient complete her pulmonary workup of difficulty breathing and abnormal CT of the chest.  At that point, vascular contact the office to schedule her procedures in the [place].  She understands and agrees. Total time of 60 minutes was spent preparing to see the patient, reviewing a myriad of outside records, hospital records, laboratories, x-rays, and advanced  studies.  Also, obtaining comprehensive history, performing medically appropriate physical examination, counseling and educating the patient regarding the above listed issues, answering multiple questions, ordering endoscopic procedures, and documenting clinical information in the health record.

## 2022-02-20 NOTE — Patient Instructions (Signed)
_______________________________________________________  If you are age 47 or older, your body mass index should be between 23-30. Your Body mass index is 31.82 kg/m. If this is out of the aforementioned range listed, please consider follow up with your Primary Care Provider.  If you are age 53 or younger, your body mass index should be between 19-25. Your Body mass index is 31.82 kg/m. If this is out of the aformentioned range listed, please consider follow up with your Primary Care Provider.   ________________________________________________________  The [facility] providers would like to encourage you to use [facility] to communicate with providers for non-urgent requests or questions.  Due to long hold times on the telephone, sending your provider a message by [facility] may be a faster and more efficient way to get a response.  Please allow 48 business hours for a response.  Please remember that this is for non-urgent requests.  _______________________________________________________  It has been recommended to you by your physician that you have a(n) egd/colonoscopy completed. Per your request, we did not schedule the procedure(s) today. Please contact our office at [phone] should you decide to have the procedure completed. You will be scheduled for a pre-visit and procedure at that time.

## 2022-02-21 ENCOUNTER — Ambulatory Visit
Admission: RE | Admit: 2022-02-21 | Discharge: 2022-02-21 | Disposition: A | Discharge status: Home / Self Care | Payer: Commercial Managed Care - HMO | Source: Ambulatory Visit | Attending: Emergency Medicine | Admitting: Emergency Medicine

## 2022-02-21 DIAGNOSIS — J984 Other disorders of lung: Secondary | ICD-10-CM

## 2022-02-23 ENCOUNTER — Other Ambulatory Visit (HOSPITAL_COMMUNITY): Payer: Self-pay

## 2022-02-23 ENCOUNTER — Telehealth: Payer: Self-pay

## 2022-02-23 NOTE — Telephone Encounter (Signed)
Pharmacy Patient Advocate Encounter   Received notification from [facility] that prior authorization for Esomperazole 40mg  is required/requested.  Per Test Claim: Prior authorization is not needed for medication  (Esomperazole 40mg )   Spoke with pharmacy to process.

## 2022-02-23 NOTE — Telephone Encounter (Signed)
Called and verified with pharmacy the prescription is able to be processed and filled. Notified patient that per the pharmacy prescription will be ready for pickup after 2pm.

## 2022-02-26 HISTORY — PX: LUNG BIOPSY: SHX232

## 2022-02-28 ENCOUNTER — Ambulatory Visit: Payer: Commercial Managed Care - HMO | Admitting: Emergency Medicine

## 2022-02-28 ENCOUNTER — Encounter: Payer: Self-pay | Admitting: Emergency Medicine

## 2022-02-28 VITALS — BP 120/76 | HR 82 | Temp 98.0°F | Ht 69.0 in | Wt 217.0 lb

## 2022-02-28 DIAGNOSIS — J4540 Moderate persistent asthma, uncomplicated: Secondary | ICD-10-CM | POA: Diagnosis not present

## 2022-02-28 DIAGNOSIS — K219 Gastro-esophageal reflux disease without esophagitis: Secondary | ICD-10-CM

## 2022-02-28 DIAGNOSIS — J984 Other disorders of lung: Secondary | ICD-10-CM | POA: Diagnosis not present

## 2022-02-28 MED ORDER — MOMETASONE FURO-FORMOTEROL FUM 100-5 MCG/ACT IN AERO: 2.0000 | INHALATION_SPRAY | Freq: Two times a day (BID) | RESPIRATORY_TRACT | 5 refills | Status: DC

## 2022-02-28 NOTE — Patient Instructions (Addendum)
We will refer you to cardiothoracic surgery to discuss possible lung biopsy to evaluate pneumonitis and interstitial lung disease Do not believe you can be restarted on any biologic therapy for your allergic asthma We will start Dulera 2 puffs twice a day.  Rinse and gargle after using. Keep albuterol available to use 2 puffs if needed for shortness of breath, chest tightness, wheezing. We will refer you to see Rheumatology to evaluate your elevated rheumatoid factor in the setting of your pneumonitis Follow with Dr. Lamonte Sakai in 2 months or sooner if you have any problems.

## 2022-02-28 NOTE — Assessment & Plan Note (Signed)
She has a history of chronic cough and obstructive lung disease but her cough and dyspnea have been progressive, different from her previous baseline and associated with significant changes on CT chest consistent with a patchy scattered pneumonitis.  Cause unclear.  She was started on biologic therapy for her asthma in summer 2023, had 4 doses.  Biologic therapy can certainly be associated with pneumonitis although I do not see any reports of this with Tezspire.  She has severe GERD with hiatal hernia, could be having LPR, chronic occult aspiration.  Her swallowing evaluation while she was recently admitted was negative.  Finally she has a significantly elevated rheumatoid factor of unclear significance.  This will need to be worked up.  I think the most straightforward way to proceed since she did not respond to either clinically or radiographically to empiric prednisone is a VATS lung biopsy.  I will try to get this arranged.  We will refer you to cardiothoracic surgery to discuss possible lung biopsy to evaluate pneumonitis and interstitial lung disease Do not believe you can be restarted on any biologic therapy for your allergic asthma We will refer you to see Rheumatology to evaluate your elevated rheumatoid factor in the setting of your pneumonitis Follow with Dr. Lamonte Sakai in 2 months or sooner if you have any problems.

## 2022-02-28 NOTE — Assessment & Plan Note (Signed)
On maximal therapy. Continue same regimen

## 2022-02-28 NOTE — Progress Notes (Signed)
Subjective:    Patient ID: Hannah Klein, female    DOB: [birth year], 47 y.o.   MRN: [id number]  HPI  ROV 01/13/21 --47 year old woman who follows up today for history of allergic asthma, allergic rhinitis.  Former tobacco smoker.  She has mixed obstruction and restriction on pulmonary function testing.  She also has newly diagnosed obstructive sleep apnea.  It took a long time for [name] to get her CPAP because supplies were on backorder but she does now have this.  She reports that she is been wearing it well. She is dealing with dryness - she does use humidity add has turned it up. She isn't really sure whether it has made her feel better during the day yet.  Reports that she is having some chest aching, no wheeze or cough She is on Symbicort. Uses albuterol about once a day.   CPAP Compliance report today shows 80% usage, 50% for greater than 4 hours on an AutoSet between 10 and 20 cm water.   ROV 02/28/22 --follow-up visit 47 year old woman, former smoker, with a history of allergic asthma, allergic rhinitis.  Her IgE and a season of cannabinoids been reassuring.  She is followed for this as well as obstructive sleep apnea for which she is on CPAP.  Pulmonary function testing with mixed obstruction and restriction.  Since last time we had to change her Symbicort to [facility] for insurance reasons.  She was started on Tezspire in mid 2023.  I saw her when she was admitted to the hospital with bilateral groundglass pulmonary infiltrates suggestive of a pneumonitis.  Her COVID-19, flu panel, full viral panel was all negative.  She had a positive rheumatoid factor, CRP. Her ACE level is negative.  Treated her with prednisone to complete a 3-week course, current dose is 0.  Small hiatal hernia and GERD but no overt aspiration symptoms.  She is not currently on bronchodilator therapy.  Symbicort no longer covered.  She does not tolerate powdered inhalers. She finished prednisone end of December.  She is  having persistent exertional SOB, persistent paroxysms of cough - really no change from the hospitalization, possibly a bit worse.   Chest 02/21/2022 reviewed by me, shows persistent widespread patchy areas of groundglass attenuation and peribronchovascular thickening, base predominant,  little change to question progressed compared with her CT 01/25/2022   Review of Systems As per HPI     Objective:   Physical Exam Vitals:   02/28/22 1545  BP: 120/76  Pulse: 82  Temp: 98 F (36.7 C)  TempSrc: Oral  SpO2: 97%  Weight: 217 lb (98.4 kg)  Height: 5\' 9"  (1.753 m)    Gen: Pleasant, overwt woman, in no distress,  normal affect  ENT: No lesions,  mouth clear,  oropharynx clear, less nasal congestion, strong voice  Neck: No JVD, no stridor  Lungs: No use of accessory muscles, no crackles or wheezing on normal respiration, no wheeze on forced expiration  Cardiovascular: RRR, heart sounds normal, no murmur or gallops, no peripheral edema  Musculoskeletal: No deformities, no cyanosis or clubbing  Neuro: alert, awake, non focal  Skin: Warm, no lesions or rash      Assessment & Plan:  Pneumonitis She has a history of chronic cough and obstructive lung disease but her cough and dyspnea have been progressive, different from her previous baseline and associated with significant changes on CT chest consistent with a patchy scattered pneumonitis.  Cause unclear.  She was started on biologic therapy for  her asthma in summer 2023, had 4 doses.  Biologic therapy can certainly be associated with pneumonitis although I do not see any reports of this with Tezspire.  She has severe GERD with hiatal hernia, could be having LPR, chronic occult aspiration.  Her swallowing evaluation while she was recently admitted was negative.  Finally she has a significantly elevated rheumatoid factor of unclear significance.  This will need to be worked up.  I think the most straightforward way to proceed since she  did not respond to either clinically or radiographically to empiric prednisone is a VATS lung biopsy.  I will try to get this arranged.  We will refer you to cardiothoracic surgery to discuss possible lung biopsy to evaluate pneumonitis and interstitial lung disease Do not believe you can be restarted on any biologic therapy for your allergic asthma We will refer you to see Rheumatology to evaluate your elevated rheumatoid factor in the setting of your pneumonitis Follow with Dr. Lamonte Sakai in 2 months or sooner if you have any problems.   GERD (gastroesophageal reflux disease) On maximal therapy. Continue same regimen  Moderate persistent asthma, uncomplicated We will start Dulera 2 puffs twice a day.  Rinse and gargle after using. Keep albuterol available to use 2 puffs if needed for shortness of breath, chest tightness, wheezing.  Time spent 41 minutes  Baltazar Apo, MD, PhD 02/28/2022, 4:18 PM [facility] [phone] or if no answer before 7:00PM call [phone] For any issues after 7:00PM please call eLink [phone]

## 2022-02-28 NOTE — Assessment & Plan Note (Signed)
We will start Dulera 2 puffs twice a day.  Rinse and gargle after using. Keep albuterol available to use 2 puffs if needed for shortness of breath, chest tightness, wheezing.

## 2022-03-01 ENCOUNTER — Other Ambulatory Visit: Payer: Commercial Managed Care - HMO

## 2022-03-02 ENCOUNTER — Encounter (HOSPITAL_COMMUNITY): Payer: Self-pay | Admitting: *Deleted

## 2022-03-02 ENCOUNTER — Other Ambulatory Visit: Payer: Self-pay | Admitting: Thoracic Surgery (Cardiothoracic Vascular Surgery)

## 2022-03-02 ENCOUNTER — Other Ambulatory Visit: Payer: Self-pay | Admitting: *Deleted

## 2022-03-02 ENCOUNTER — Institutional Professional Consult (permissible substitution): Payer: Commercial Managed Care - HMO | Admitting: Thoracic Surgery (Cardiothoracic Vascular Surgery)

## 2022-03-02 VITALS — BP 165/98 | HR 80 | Resp 20 | Ht 69.0 in | Wt 217.0 lb

## 2022-03-02 DIAGNOSIS — J849 Interstitial pulmonary disease, unspecified: Secondary | ICD-10-CM

## 2022-03-02 DIAGNOSIS — J984 Other disorders of lung: Secondary | ICD-10-CM | POA: Diagnosis not present

## 2022-03-02 NOTE — Progress Notes (Signed)
[address]       [place] [address]             [phone]                    Hannah Klein Fellows Medical Record #[id number] Date of Birth: [birth year]  Referring: Collene Gobble, MD Primary Care: Pleas Koch, NP Primary Cardiologist: None  Chief Complaint:    Chief Complaint  Patient presents with   Consult    Chest CT 12/27, spirometry 11/30    History of Present Illness:    Hannah Klein 47 y.o. female presents for surgical evaluation and interstitial lung process.  She states that over the past 4 to 5 months she has had progressive shortness of breath, and can now barely walk short distances without having to stop.  She does have a long history of asthma, but her symptoms have progressed significantly over the last year.  She also has a history of gastroesophageal reflux, and a small hiatal hernia.      Zubrod Score: At the time of surgery this patient's most appropriate activity status/level should be described as: []     0    Normal activity, no symptoms []     1    Restricted in physical strenuous activity but ambulatory, able to do out light work [x]     2    Ambulatory and capable of self care, unable to do work activities, up and about               >50 % of waking hours                              []     3    Only limited self care, in bed greater than 50% of waking hours []     4    Completely disabled, no self care, confined to bed or chair []     5    Moribund   Past Medical History:  Diagnosis Date   Arthritis    Asthma    Environmental allergies    Generalized anxiety disorder    GERD (gastroesophageal reflux disease)    Hx of colonic polyps    Hypertension    Migraines    MRSA (methicillin resistant Staphylococcus aureus)    Lt upper arm  approx 8 years ago    Past Surgical History:  Procedure Laterality Date   CHOLECYSTECTOMY     DILATION AND CURETTAGE OF UTERUS     NECK SURGERY     Fusion to C6  and C7   TYMPANOSTOMY TUBE PLACEMENT     WRIST ARTHROSCOPY WITH DEBRIDEMENT Left 02/27/2018   Procedure: WRIST ARTHROSCOPY WITH DEBRIDEMENT;  Surgeon: Leanora Cover, MD;  Location: [facility];  Service: Orthopedics;  Laterality: Left;    Family History  Problem Relation Age of Onset   Eczema Mother    Allergic rhinitis Brother    Asthma Brother    Asthma Brother    Allergic rhinitis Brother    Urticaria Other    Angioedema Other      Social History   Tobacco Use  Smoking Status Former   Packs/day: 2.00   Years: 18.00   Total pack years: 36.00   Types: Cigarettes   Start date: 25   Quit date: 2009   Years since quitting: 15.0   Passive  exposure: Current  Smokeless Tobacco Never    Social History   Substance and Sexual Activity  Alcohol Use No   Alcohol/week: 0.0 standard drinks of alcohol     Allergies  Allergen Reactions   Advair Diskus [Fluticasone-Salmeterol] Other (See Comments)    "dries my throat out really bad'   Flonase [Fluticasone]     Nose bleeds   Lipitor [Atorvastatin Calcium] Other (See Comments)    Elevations in blood pressure?    Current Outpatient Medications  Medication Sig Dispense Refill   albuterol (VENTOLIN HFA) 108 (90 Base) MCG/ACT inhaler Inhale into the lungs.     albuterol (VENTOLIN HFA) 108 (90 Base) MCG/ACT inhaler INHALE 1-2 PUFFS BY MOUTH EVERY 6 HOURS AS NEEDED FOR WHEEZE OR SHORTNESS OF BREATH 8.5 each 1   Cholecalciferol (VITAMIN D3 PO) Take 1,000 Units by mouth.     clobetasol ointment (TEMOVATE) 0.05 % Apply 1 Application topically 2 (two) times daily. 30 g 0   esomeprazole (NEXIUM) 40 MG capsule TAKE 1 CAPSULE (40 MG TOTAL) BY MOUTH 2 (TWO) TIMES DAILY. FOR HEARTBURN. 180 capsule 2   famotidine (PEPCID) 40 MG tablet TAKE 1 TABLET BY MOUTH TWICE A DAY 60 tablet 5   FIBER PO Take 1 tablet by mouth daily as needed.     hydrOXYzine (VISTARIL) 50 MG capsule TAKE 1 CAPSULE BY MOUTH ONCE OR TWICE DAILY AS NEEDED  FOR ANXIETY 180 capsule 2   levocetirizine (XYZAL) 5 MG tablet Take 1 tablet (5 mg total) by mouth in the morning and at bedtime. 180 tablet 1   mometasone-formoterol (DULERA) 100-5 MCG/ACT AERO Inhale 2 puffs into the lungs 2 (two) times daily. 13 g 5   Multiple Vitamin (MULTIVITAMIN) capsule Take 1 capsule by mouth daily.     rosuvastatin (CRESTOR) 10 MG tablet Take 1 tablet (10 mg total) by mouth daily. for cholesterol. 90 tablet 3   sucralfate (CARAFATE) 1 g tablet Take 1 tablet (1 g total) by mouth in the morning, at noon, and at bedtime. (Patient taking differently: Take 1 g by mouth 2 (two) times daily.) 90 tablet 5   vitamin B-12 (CYANOCOBALAMIN) 1000 MCG tablet Take 1,000 mcg by mouth daily.     vitamin C (ASCORBIC ACID) 500 MG tablet Take 500 mg by mouth daily.     vitamin E 100 UNIT capsule Take 400 Units by mouth daily.     formoterol (PERFOROMIST) 20 MCG/2ML nebulizer solution Take 2 mLs (20 mcg total) by nebulization 2 (two) times daily. 120 mL 1   No current facility-administered medications for this visit.    Review of Systems  Constitutional:  Positive for malaise/fatigue. Negative for fever.  Respiratory:  Positive for cough and shortness of breath.   Cardiovascular:  Negative for chest pain.     PHYSICAL EXAMINATION: BP (!) 165/98 (BP Location: Right Arm, Patient Position: Sitting)   Pulse 80   Resp 20   Ht 5\' 9"  (1.753 m)   Wt 217 lb (98.4 kg)   LMP 01/18/2022 (Within Days)   SpO2 96% Comment: RA  BMI 32.05 kg/m  Physical Exam Constitutional:      General: She is not in acute distress.    Appearance: Normal appearance. She is not ill-appearing.  Cardiovascular:     Rate and Rhythm: Normal rate.  Pulmonary:     Effort: Pulmonary effort is normal. No respiratory distress.     Comments: Dyspneic with short distance ambulation Abdominal:     General: Abdomen is flat.  Musculoskeletal:        General: Normal range of motion.     Cervical back: Normal range  of motion.  Skin:    General: Skin is warm and dry.  Neurological:     General: No focal deficit present.     Mental Status: She is alert and oriented to person, place, and time.     Diagnostic Studies & Laboratory data:     Recent Radiology Findings:   CT Chest Wo Contrast  Result Date: 02/22/2022 CLINICAL DATA:  47 year old female former smoker (quit in 2009) with pneumonitis. EXAM: CT CHEST WITHOUT CONTRAST TECHNIQUE: Multidetector CT imaging of the chest was performed following the standard protocol without IV contrast. RADIATION DOSE REDUCTION: This exam was performed according to the departmental dose-optimization program which includes automated exposure control, adjustment of the mA and/or kV according to patient size and/or use of iterative reconstruction technique. COMPARISON:  Chest CT 01/25/2022. FINDINGS: Cardiovascular: Heart size is mildly enlarged. There is no significant pericardial fluid, thickening or pericardial calcification. No atherosclerotic calcifications are noted in the thoracic aorta or the coronary arteries. Mediastinum/Nodes: No pathologically enlarged mediastinal or hilar lymph nodes. Several densely calcified left hilar and mediastinal lymph nodes are incidentally noted. Esophagus is unremarkable in appearance. No axillary lymphadenopathy. Lungs/Pleura: High-resolution imaging was not performed. With these limitations in mind, there are widespread but patchy areas of ground-glass attenuation, septal thickening, thickening of the peribronchovascular interstitium, subpleural reticulation, and developing mild cylindrical traction bronchiectasis are noted throughout the lungs bilaterally. Findings have a definitive craniocaudal gradient. Findings are mildly progressive compared to the most recent prior study, but have clearly progressed compared to more remote prior examination 04/07/2021. No frank honeycombing. No acute consolidative airspace disease. No pleural effusions.  No definite suspicious appearing pulmonary nodules or masses are noted. Upper Abdomen: Numerous calcified granulomas are noted throughout the spleen. Status post cholecystectomy. Musculoskeletal: There are no aggressive appearing lytic or blastic lesions noted in the visualized portions of the skeleton. IMPRESSION: 1. The appearance of the lungs is compatible with progressive interstitial lung disease, with a spectrum of findings considered probable usual interstitial pneumonia (UIP) per current ATS guidelines. Repeat high-resolution chest CT is suggested in 12 months to assess for temporal changes in the appearance of the lung parenchyma. 2. Mild cardiomegaly. 3. Old granulomatous disease, as above. Electronically Signed   By: Vinnie Langton M.D.   On: 02/22/2022 09:30       I have independently reviewed the above radiology studies  and reviewed the findings with the patient.   Recent Lab Findings: Lab Results  Component Value Date   WBC 8.7 01/25/2022   HGB 13.8 01/25/2022   HCT 40.3 01/25/2022   PLT 235 01/25/2022   GLUCOSE 88 01/25/2022   CHOL 203 (H) 06/09/2021   TRIG 237.0 (H) 06/09/2021   HDL 42.70 06/09/2021   LDLDIRECT 135.0 06/09/2021   LDLCALC 94 03/17/2020   ALT 18 01/25/2022   AST 32 01/25/2022   NA 135 01/25/2022   K 3.9 01/25/2022   CL 100 01/25/2022   CREATININE 0.79 01/25/2022   BUN 17 01/25/2022   CO2 26 01/25/2022   TSH 0.89 03/17/2020   HGBA1C 5.6 06/09/2021     PFTs: Pending    IMPRESSION: 1. The appearance of the lungs is compatible with progressive interstitial lung disease, with a spectrum of findings considered probable usual interstitial pneumonia (UIP) per current ATS guidelines. Repeat high-resolution chest CT is suggested in 12 months to assess for temporal  changes in the appearance of the lung parenchyma. 2. Mild cardiomegaly. 3. Old granulomatous disease, as above.  Assessment / Plan:   47 year old female with an interstitial lung  disease pattern, and significant dyspnea with exertion.  I personally reviewed her cross-sectional imaging, and explained that the only way to obtain a definitive diagnosis would be through surgical biopsy.  She is agreeable to proceed with a right video-assisted thoracoscopy with wedge resection.  We discussed the risks and benefits and possibility this procedure eliciting worsening her symptoms.  She is agreeable to proceed.  In regards to her hiatal hernia, I do not think that her radiographic findings are consistent with chronic aspiration.  In either case the biopsy will help US obtain a definitive diagnosis.  She will need pulmonary function tests as well as a stress test prior to surgery.      I  spent 40 minutes with  the patient face to face in counseling and coordination of care.    Hannah Klein 03/02/2022 12:40 PM

## 2022-03-02 NOTE — H&P (View-Only) (Signed)
[address]       [place] [address]             [phone]                    [name] Fellows Medical Record #[id number] Date of Birth: [birth year]  Referring: Collene Gobble, MD Primary Care: Pleas Koch, NP Primary Cardiologist: None  Chief Complaint:    Chief Complaint  Patient presents with   Consult    Chest CT 12/27, spirometry 11/30    History of Present Illness:    Hannah Klein 47 y.o. female presents for surgical evaluation and interstitial lung process.  She states that over the past 4 to 5 months she has had progressive shortness of breath, and can now barely walk short distances without having to stop.  She does have a long history of asthma, but her symptoms have progressed significantly over the last year.  She also has a history of gastroesophageal reflux, and a small hiatal hernia.      Zubrod Score: At the time of surgery this patient's most appropriate activity status/level should be described as: []     0    Normal activity, no symptoms []     1    Restricted in physical strenuous activity but ambulatory, able to do out light work [x]     2    Ambulatory and capable of self care, unable to do work activities, up and about               >50 % of waking hours                              []     3    Only limited self care, in bed greater than 50% of waking hours []     4    Completely disabled, no self care, confined to bed or chair []     5    Moribund   Past Medical History:  Diagnosis Date   Arthritis    Asthma    Environmental allergies    Generalized anxiety disorder    GERD (gastroesophageal reflux disease)    Hx of colonic polyps    Hypertension    Migraines    MRSA (methicillin resistant Staphylococcus aureus)    Lt upper arm  approx 8 years ago    Past Surgical History:  Procedure Laterality Date   CHOLECYSTECTOMY     DILATION AND CURETTAGE OF UTERUS     NECK SURGERY     Fusion to C6  and C7   TYMPANOSTOMY TUBE PLACEMENT     WRIST ARTHROSCOPY WITH DEBRIDEMENT Left 02/27/2018   Procedure: WRIST ARTHROSCOPY WITH DEBRIDEMENT;  Surgeon: Leanora Cover, MD;  Location: [facility];  Service: Orthopedics;  Laterality: Left;    Family History  Problem Relation Age of Onset   Eczema Mother    Allergic rhinitis Brother    Asthma Brother    Asthma Brother    Allergic rhinitis Brother    Urticaria Other    Angioedema Other      Social History   Tobacco Use  Smoking Status Former   Packs/day: 2.00   Years: 18.00   Total pack years: 36.00   Types: Cigarettes   Start date: 25   Quit date: 2009   Years since quitting: 15.0   Passive  exposure: Current  Smokeless Tobacco Never    Social History   Substance and Sexual Activity  Alcohol Use No   Alcohol/week: 0.0 standard drinks of alcohol     Allergies  Allergen Reactions   Advair Diskus [Fluticasone-Salmeterol] Other (See Comments)    "dries my throat out really bad'   Flonase [Fluticasone]     Nose bleeds   Lipitor [Atorvastatin Calcium] Other (See Comments)    Elevations in blood pressure?    Current Outpatient Medications  Medication Sig Dispense Refill   albuterol (VENTOLIN HFA) 108 (90 Base) MCG/ACT inhaler Inhale into the lungs.     albuterol (VENTOLIN HFA) 108 (90 Base) MCG/ACT inhaler INHALE 1-2 PUFFS BY MOUTH EVERY 6 HOURS AS NEEDED FOR WHEEZE OR SHORTNESS OF BREATH 8.5 each 1   Cholecalciferol (VITAMIN D3 PO) Take 1,000 Units by mouth.     clobetasol ointment (TEMOVATE) 0.05 % Apply 1 Application topically 2 (two) times daily. 30 g 0   esomeprazole (NEXIUM) 40 MG capsule TAKE 1 CAPSULE (40 MG TOTAL) BY MOUTH 2 (TWO) TIMES DAILY. FOR HEARTBURN. 180 capsule 2   famotidine (PEPCID) 40 MG tablet TAKE 1 TABLET BY MOUTH TWICE A DAY 60 tablet 5   FIBER PO Take 1 tablet by mouth daily as needed.     hydrOXYzine (VISTARIL) 50 MG capsule TAKE 1 CAPSULE BY MOUTH ONCE OR TWICE DAILY AS NEEDED  FOR ANXIETY 180 capsule 2   levocetirizine (XYZAL) 5 MG tablet Take 1 tablet (5 mg total) by mouth in the morning and at bedtime. 180 tablet 1   mometasone-formoterol (DULERA) 100-5 MCG/ACT AERO Inhale 2 puffs into the lungs 2 (two) times daily. 13 g 5   Multiple Vitamin (MULTIVITAMIN) capsule Take 1 capsule by mouth daily.     rosuvastatin (CRESTOR) 10 MG tablet Take 1 tablet (10 mg total) by mouth daily. for cholesterol. 90 tablet 3   sucralfate (CARAFATE) 1 g tablet Take 1 tablet (1 g total) by mouth in the morning, at noon, and at bedtime. (Patient taking differently: Take 1 g by mouth 2 (two) times daily.) 90 tablet 5   vitamin B-12 (CYANOCOBALAMIN) 1000 MCG tablet Take 1,000 mcg by mouth daily.     vitamin C (ASCORBIC ACID) 500 MG tablet Take 500 mg by mouth daily.     vitamin E 100 UNIT capsule Take 400 Units by mouth daily.     formoterol (PERFOROMIST) 20 MCG/2ML nebulizer solution Take 2 mLs (20 mcg total) by nebulization 2 (two) times daily. 120 mL 1   No current facility-administered medications for this visit.    Review of Systems  Constitutional:  Positive for malaise/fatigue. Negative for fever.  Respiratory:  Positive for cough and shortness of breath.   Cardiovascular:  Negative for chest pain.     PHYSICAL EXAMINATION: BP (!) 165/98 (BP Location: Right Arm, Patient Position: Sitting)   Pulse 80   Resp 20   Ht 5' 9" (1.753 m)   Wt 217 lb (98.4 kg)   LMP 01/18/2022 (Within Days)   SpO2 96% Comment: RA  BMI 32.05 kg/m  Physical Exam Constitutional:      General: She is not in acute distress.    Appearance: Normal appearance. She is not ill-appearing.  Cardiovascular:     Rate and Rhythm: Normal rate.  Pulmonary:     Effort: Pulmonary effort is normal. No respiratory distress.     Comments: Dyspneic with short distance ambulation Abdominal:     General: Abdomen is flat.    Musculoskeletal:        General: Normal range of motion.     Cervical back: Normal range  of motion.  Skin:    General: Skin is warm and dry.  Neurological:     General: No focal deficit present.     Mental Status: She is alert and oriented to person, place, and time.     Diagnostic Studies & Laboratory data:     Recent Radiology Findings:   CT Chest Wo Contrast  Result Date: 02/22/2022 CLINICAL DATA:  47 year old female former smoker (quit in 2009) with pneumonitis. EXAM: CT CHEST WITHOUT CONTRAST TECHNIQUE: Multidetector CT imaging of the chest was performed following the standard protocol without IV contrast. RADIATION DOSE REDUCTION: This exam was performed according to the departmental dose-optimization program which includes automated exposure control, adjustment of the mA and/or kV according to patient size and/or use of iterative reconstruction technique. COMPARISON:  Chest CT 01/25/2022. FINDINGS: Cardiovascular: Heart size is mildly enlarged. There is no significant pericardial fluid, thickening or pericardial calcification. No atherosclerotic calcifications are noted in the thoracic aorta or the coronary arteries. Mediastinum/Nodes: No pathologically enlarged mediastinal or hilar lymph nodes. Several densely calcified left hilar and mediastinal lymph nodes are incidentally noted. Esophagus is unremarkable in appearance. No axillary lymphadenopathy. Lungs/Pleura: High-resolution imaging was not performed. With these limitations in mind, there are widespread but patchy areas of ground-glass attenuation, septal thickening, thickening of the peribronchovascular interstitium, subpleural reticulation, and developing mild cylindrical traction bronchiectasis are noted throughout the lungs bilaterally. Findings have a definitive craniocaudal gradient. Findings are mildly progressive compared to the most recent prior study, but have clearly progressed compared to more remote prior examination 04/07/2021. No frank honeycombing. No acute consolidative airspace disease. No pleural effusions.  No definite suspicious appearing pulmonary nodules or masses are noted. Upper Abdomen: Numerous calcified granulomas are noted throughout the spleen. Status post cholecystectomy. Musculoskeletal: There are no aggressive appearing lytic or blastic lesions noted in the visualized portions of the skeleton. IMPRESSION: 1. The appearance of the lungs is compatible with progressive interstitial lung disease, with a spectrum of findings considered probable usual interstitial pneumonia (UIP) per current ATS guidelines. Repeat high-resolution chest CT is suggested in 12 months to assess for temporal changes in the appearance of the lung parenchyma. 2. Mild cardiomegaly. 3. Old granulomatous disease, as above. Electronically Signed   By: Vinnie Langton M.D.   On: 02/22/2022 09:30       I have independently reviewed the above radiology studies  and reviewed the findings with the patient.   Recent Lab Findings: Lab Results  Component Value Date   WBC 8.7 01/25/2022   HGB 13.8 01/25/2022   HCT 40.3 01/25/2022   PLT 235 01/25/2022   GLUCOSE 88 01/25/2022   CHOL 203 (H) 06/09/2021   TRIG 237.0 (H) 06/09/2021   HDL 42.70 06/09/2021   LDLDIRECT 135.0 06/09/2021   LDLCALC 94 03/17/2020   ALT 18 01/25/2022   AST 32 01/25/2022   NA 135 01/25/2022   K 3.9 01/25/2022   CL 100 01/25/2022   CREATININE 0.79 01/25/2022   BUN 17 01/25/2022   CO2 26 01/25/2022   TSH 0.89 03/17/2020   HGBA1C 5.6 06/09/2021     PFTs: Pending    IMPRESSION: 1. The appearance of the lungs is compatible with progressive interstitial lung disease, with a spectrum of findings considered probable usual interstitial pneumonia (UIP) per current ATS guidelines. Repeat high-resolution chest CT is suggested in 12 months to assess for temporal  changes in the appearance of the lung parenchyma. 2. Mild cardiomegaly. 3. Old granulomatous disease, as above.  Assessment / Plan:   46-year-old female with an interstitial lung  disease pattern, and significant dyspnea with exertion.  I personally reviewed her cross-sectional imaging, and explained that the only way to obtain a definitive diagnosis would be through surgical biopsy.  She is agreeable to proceed with a right video-assisted thoracoscopy with wedge resection.  We discussed the risks and benefits and possibility this procedure eliciting worsening her symptoms.  She is agreeable to proceed.  In regards to her hiatal hernia, I do not think that her radiographic findings are consistent with chronic aspiration.  In either case the biopsy will help us obtain a definitive diagnosis.  She will need pulmonary function tests as well as a stress test prior to surgery.      I  spent 40 minutes with  the patient face to face in counseling and coordination of care.    Avanelle Pixley O Evaleen Sant 03/02/2022 12:40 PM        

## 2022-03-05 ENCOUNTER — Ambulatory Visit (HOSPITAL_BASED_OUTPATIENT_CLINIC_OR_DEPARTMENT_OTHER): Payer: Commercial Managed Care - HMO

## 2022-03-05 ENCOUNTER — Encounter: Payer: Self-pay | Admitting: *Deleted

## 2022-03-05 ENCOUNTER — Encounter: Payer: Self-pay | Admitting: Emergency Medicine

## 2022-03-05 ENCOUNTER — Other Ambulatory Visit: Payer: Self-pay | Admitting: *Deleted

## 2022-03-05 DIAGNOSIS — Z01810 Encounter for preprocedural cardiovascular examination: Secondary | ICD-10-CM | POA: Insufficient documentation

## 2022-03-05 DIAGNOSIS — J984 Other disorders of lung: Secondary | ICD-10-CM | POA: Insufficient documentation

## 2022-03-05 LAB — MYOCARDIAL PERFUSION IMAGING
LV dias vol: 77 mL (ref 46–106)
LV sys vol: 26 mL
Nuc Stress EF: 67 %
Peak HR: 93 {beats}/min
Rest HR: 81 {beats}/min
Rest Nuclear Isotope Dose: 10.4 mCi
SDS: 0
SRS: 0
SSS: 0
ST Depression (mm): 0 mm
Stress Nuclear Isotope Dose: 30.5 mCi
TID: 1.08

## 2022-03-05 MED ORDER — TECHNETIUM TC 99M TETROFOSMIN IV KIT
10.4000 | PACK | Freq: Once | INTRAVENOUS | Status: AC | PRN
  Administered 2022-03-05: 10.4 via INTRAVENOUS

## 2022-03-05 MED ORDER — TECHNETIUM TC 99M TETROFOSMIN IV KIT
30.5000 | PACK | Freq: Once | INTRAVENOUS | Status: AC | PRN
  Administered 2022-03-05: 30.5 via INTRAVENOUS

## 2022-03-05 MED ORDER — REGADENOSON 0.4 MG/5ML IV SOLN
0.4000 mg | Freq: Once | INTRAVENOUS | Status: AC
  Administered 2022-03-05: 0.4 mg via INTRAVENOUS

## 2022-03-06 NOTE — Pre-Procedure Instructions (Signed)
Surgical Instructions    Your procedure is scheduled on Thursday, January 11th.  Report to [facility] Main Entrance "A" at 11:30 A.M., then check in with the Admitting office.  Call this number if you have problems the morning of surgery:  [phone]  If you have any questions prior to your surgery date call [phone]: Open Monday-Friday 8am-4pm If you experience any cold or flu symptoms such as cough, fever, chills, shortness of breath, etc. between now and your scheduled surgery, please notify us at the above number.     Remember:  Do not eat or drink after midnight the night before your surgery    Take these medicines the morning of surgery with A SIP OF WATER  famotidine (PEPCID)  hydrOXYzine (VISTARIL)  levocetirizine (XYZAL)  mometasone-formoterol (DULERA)  rosuvastatin (CRESTOR)  sucralfate (CARAFATE)    If needed: albuterol (VENTOLIN HFA)- if needed, bring with you on day of surgery esomeprazole (NEXIUM)  formoterol (PERFOROMIST)     As of today, STOP taking any Aspirin (unless otherwise instructed by your surgeon) Aleve, Naproxen, Ibuprofen, Motrin, Advil, Goody's, BC's, all herbal medications, fish oil, and all vitamins. This includes diclofenac Sodium (VOLTAREN ARTHRITIS PAIN).                     Do NOT Smoke (Tobacco/Vaping) for 24 hours prior to your procedure.  If you use a CPAP at night, you may bring your mask/headgear for your overnight stay.   Contacts, glasses, piercing's, hearing aid's, dentures or partials may not be worn into surgery, please bring cases for these belongings.    For patients admitted to the hospital, discharge time will be determined by your treatment team.   Patients discharged the day of surgery will not be allowed to drive home, and someone needs to stay with them for 24 hours.  SURGICAL WAITING ROOM VISITATION Patients having surgery or a procedure may have no more than 2 support people in the waiting area - these visitors  may rotate.   Children under the age of 31 must have an adult with them who is not the patient. If the patient needs to stay at the hospital during part of their recovery, the visitor guidelines for inpatient rooms apply. Pre-op nurse will coordinate an appropriate time for 1 support person to accompany patient in pre-op.  This support person may not rotate.   Please refer to the [facility] website for the visitor guidelines for Inpatients (after your surgery is over and you are in a regular room).    Special instructions:   [facility]- Preparing For Surgery  Before surgery, you can play an important role. Because skin is not sterile, your skin needs to be as free of germs as possible. You can reduce the number of germs on your skin by washing with CHG (chlorahexidine gluconate) Soap before surgery.  CHG is an antiseptic cleaner which kills germs and bonds with the skin to continue killing germs even after washing.    Oral Hygiene is also important to reduce your risk of infection.  Remember - BRUSH YOUR TEETH THE MORNING OF SURGERY WITH YOUR REGULAR TOOTHPASTE  Please do not use if you have an allergy to CHG or antibacterial soaps. If your skin becomes reddened/irritated stop using the CHG.  Do not shave (including legs and underarms) for at least 48 hours prior to first CHG shower. It is OK to shave your face.  Please follow these instructions carefully.   Shower the [employer]  SURGERY and the MORNING OF SURGERY  If you chose to wash your hair, wash your hair first as usual with your normal shampoo.  After you shampoo, rinse your hair and body thoroughly to remove the shampoo.  Use CHG Soap as you would any other liquid soap. You can apply CHG directly to the skin and wash gently with a scrungie or a clean washcloth.   Apply the CHG Soap to your body ONLY FROM THE NECK DOWN.  Do not use on open wounds or open sores. Avoid contact with your eyes, ears, mouth and genitals (private  parts). Wash Face and genitals (private parts)  with your normal soap.   Wash thoroughly, paying special attention to the area where your surgery will be performed.  Thoroughly rinse your body with warm water from the neck down.  DO NOT shower/wash with your normal soap after using and rinsing off the CHG Soap.  Pat yourself dry with a CLEAN TOWEL.  Wear CLEAN PAJAMAS to bed the night before surgery  Place CLEAN SHEETS on your bed the night before your surgery  DO NOT SLEEP WITH PETS.   Day of Surgery: Take a shower with CHG soap. Do not wear jewelry or makeup Do not wear lotions, powders, perfume, or deodorant. Do not shave 48 hours prior to surgery.  Do not bring valuables to the hospital. [facility] is not responsible for any belongings or valuables. Do not wear nail polish, gel polish, artificial nails, or any other type of covering on natural nails (fingers and toes) If you have artificial nails or gel coating that need to be removed by a nail salon, please have this removed prior to surgery. Artificial nails or gel coating may interfere with anesthesia's ability to adequately monitor your vital signs. Wear Clean/Comfortable clothing the morning of surgery Remember to brush your teeth WITH YOUR REGULAR TOOTHPASTE.   Please read over the following fact sheets that you were given.    If you received a COVID test during your pre-op visit  it is requested that you wear a mask when out in public, stay away from anyone that may not be feeling well and notify your surgeon if you develop symptoms. If you have been in contact with anyone that has tested positive in the last 10 days please notify you surgeon.

## 2022-03-07 ENCOUNTER — Other Ambulatory Visit: Payer: Self-pay

## 2022-03-07 ENCOUNTER — Encounter (HOSPITAL_COMMUNITY): Payer: Self-pay

## 2022-03-07 ENCOUNTER — Encounter (HOSPITAL_COMMUNITY)
Admission: RE | Admit: 2022-03-07 | Discharge: 2022-03-07 | Disposition: A | Discharge status: Home / Self Care | Payer: Commercial Managed Care - HMO | Source: Ambulatory Visit | Attending: Thoracic Surgery (Cardiothoracic Vascular Surgery) | Admitting: Thoracic Surgery (Cardiothoracic Vascular Surgery)

## 2022-03-07 ENCOUNTER — Ambulatory Visit (HOSPITAL_COMMUNITY)
Admission: RE | Admit: 2022-03-07 | Discharge: 2022-03-07 | Disposition: A | Discharge status: Home / Self Care | Payer: Commercial Managed Care - HMO | Source: Ambulatory Visit | Attending: Thoracic Surgery (Cardiothoracic Vascular Surgery) | Admitting: Thoracic Surgery (Cardiothoracic Vascular Surgery)

## 2022-03-07 VITALS — BP 136/86 | HR 70 | Temp 97.9°F | Resp 17 | Ht 69.0 in | Wt 218.0 lb

## 2022-03-07 DIAGNOSIS — I10 Essential (primary) hypertension: Secondary | ICD-10-CM

## 2022-03-07 DIAGNOSIS — Z01818 Encounter for other preprocedural examination: Secondary | ICD-10-CM

## 2022-03-07 DIAGNOSIS — J984 Other disorders of lung: Secondary | ICD-10-CM

## 2022-03-07 DIAGNOSIS — Z87891 Personal history of nicotine dependence: Secondary | ICD-10-CM | POA: Insufficient documentation

## 2022-03-07 DIAGNOSIS — Z1152 Encounter for screening for COVID-19: Secondary | ICD-10-CM | POA: Insufficient documentation

## 2022-03-07 DIAGNOSIS — J849 Interstitial pulmonary disease, unspecified: Secondary | ICD-10-CM | POA: Insufficient documentation

## 2022-03-07 HISTORY — DX: Personal history of other diseases of the digestive system: Z87.19

## 2022-03-07 HISTORY — DX: Other dysphagia: R13.19

## 2022-03-07 HISTORY — DX: Sleep apnea, unspecified: G47.30

## 2022-03-07 LAB — CBC
HCT: 40.7 % (ref 36.0–46.0)
Hemoglobin: 13.6 g/dL (ref 12.0–15.0)
MCH: 29.4 pg (ref 26.0–34.0)
MCHC: 33.4 g/dL (ref 30.0–36.0)
MCV: 87.9 fL (ref 80.0–100.0)
Platelets: 244 10*3/uL (ref 150–400)
RBC: 4.63 MIL/uL (ref 3.87–5.11)
RDW: 13 % (ref 11.5–15.5)
WBC: 9.4 10*3/uL (ref 4.0–10.5)
nRBC: 0 % (ref 0.0–0.2)

## 2022-03-07 LAB — PULMONARY FUNCTION TEST
DL/VA % pred: 71 %
DL/VA: 3 ml/min/mmHg/L
DLCO cor % pred: 27 %
DLCO cor: 6.98 ml/min/mmHg
DLCO unc % pred: 28 %
DLCO unc: 7.03 ml/min/mmHg
FEF 25-75 Post: 1.97 L/sec
FEF 25-75 Pre: 1.82 L/sec
FEF2575-%Change-Post: 8 %
FEF2575-%Pred-Post: 61 %
FEF2575-%Pred-Pre: 57 %
FEV1-%Change-Post: 4 %
FEV1-%Pred-Post: 47 %
FEV1-%Pred-Pre: 45 %
FEV1-Post: 1.6 L
FEV1-Pre: 1.53 L
FEV1FVC-%Change-Post: 5 %
FEV1FVC-%Pred-Pre: 105 %
FEV6-%Change-Post: 0 %
FEV6-%Pred-Post: 42 %
FEV6-%Pred-Pre: 42 %
FEV6-Post: 1.74 L
FEV6-Pre: 1.75 L
FEV6FVC-%Pred-Post: 102 %
FEV6FVC-%Pred-Pre: 102 %
FVC-%Change-Post: -1 %
FVC-%Pred-Post: 41 %
FVC-%Pred-Pre: 42 %
FVC-Post: 1.77 L
FVC-Pre: 1.79 L
Post FEV1/FVC ratio: 91 %
Post FEV6/FVC ratio: 100 %
Pre FEV1/FVC ratio: 86 %
Pre FEV6/FVC Ratio: 100 %

## 2022-03-07 LAB — COMPREHENSIVE METABOLIC PANEL
ALT: 21 U/L (ref 0–44)
AST: 30 U/L (ref 15–41)
Albumin: 3.8 g/dL (ref 3.5–5.0)
Alkaline Phosphatase: 54 U/L (ref 38–126)
Anion gap: 8 (ref 5–15)
BUN: 8 mg/dL (ref 6–20)
CO2: 25 mmol/L (ref 22–32)
Calcium: 9.1 mg/dL (ref 8.9–10.3)
Chloride: 103 mmol/L (ref 98–111)
Creatinine, Ser: 0.82 mg/dL (ref 0.44–1.00)
GFR, Estimated: >60 mL/min (ref >60)
Glucose, Bld: 90 mg/dL (ref 70–99)
Potassium: 4.2 mmol/L (ref 3.5–5.1)
Sodium: 136 mmol/L (ref 135–145)
Total Bilirubin: 1.1 mg/dL (ref 0.3–1.2)
Total Protein: 7.4 g/dL (ref 6.5–8.1)

## 2022-03-07 LAB — SURGICAL PCR SCREEN
MRSA, PCR: NEGATIVE
Staphylococcus aureus: NEGATIVE

## 2022-03-07 LAB — SARS CORONAVIRUS 2 BY RT PCR: SARS Coronavirus 2 by RT PCR: NEGATIVE

## 2022-03-07 LAB — URINALYSIS, ROUTINE W REFLEX MICROSCOPIC
Bilirubin Urine: NEGATIVE
Glucose, UA: NEGATIVE mg/dL
Hgb urine dipstick: NEGATIVE
Ketones, ur: NEGATIVE mg/dL
Leukocytes,Ua: NEGATIVE
Nitrite: NEGATIVE
Protein, ur: NEGATIVE mg/dL
Specific Gravity, Urine: 1.023 (ref 1.005–1.030)
pH: 5 (ref 5.0–8.0)

## 2022-03-07 LAB — PROTIME-INR
INR: 1.1 (ref 0.8–1.2)
Prothrombin Time: 13.8 seconds (ref 11.4–15.2)

## 2022-03-07 LAB — TYPE AND SCREEN
ABO/RH(D): A POS
Antibody Screen: NEGATIVE

## 2022-03-07 LAB — APTT: aPTT: 31 seconds (ref 24–36)

## 2022-03-07 MED ORDER — ALBUTEROL SULFATE (2.5 MG/3ML) 0.083% IN NEBU
2.5000 mg | INHALATION_SOLUTION | Freq: Once | RESPIRATORY_TRACT | Status: AC
  Administered 2022-03-07: 2.5 mg via RESPIRATORY_TRACT

## 2022-03-07 NOTE — Progress Notes (Signed)
PCP - Alma Friendly, NP Cardiologist - denies  PPM/ICD - denies   Chest x-ray - 03/07/22 EKG - 03/07/22 Stress Test - 03/05/22 ECHO - 01/26/22 Cardiac Cath - denies  Sleep Study - sleep study 07/12/20, OSA+ CPAP - denies  DM- denies    ASA/Blood Thinner Instructions: n/a   ERAS Protcol - no, NPO   COVID TEST- 03/07/22   Anesthesia review: no  Patient denies shortness of breath, fever, cough and chest pain at PAT appointment   All instructions explained to the patient, with a verbal understanding of the material. Patient agrees to go over the instructions while at home for a better understanding. Patient also instructed to wear a mask in public after being tested for COVID-19. The opportunity to ask questions was provided.

## 2022-03-08 ENCOUNTER — Other Ambulatory Visit: Payer: Self-pay

## 2022-03-08 ENCOUNTER — Inpatient Hospital Stay (HOSPITAL_COMMUNITY): Payer: Commercial Managed Care - HMO | Admitting: Certified Registered Nurse Anesthetist

## 2022-03-08 ENCOUNTER — Encounter (HOSPITAL_COMMUNITY): Payer: Self-pay | Admitting: Thoracic Surgery (Cardiothoracic Vascular Surgery)

## 2022-03-08 ENCOUNTER — Inpatient Hospital Stay (HOSPITAL_COMMUNITY): Payer: Commercial Managed Care - HMO

## 2022-03-08 ENCOUNTER — Encounter (HOSPITAL_COMMUNITY)
Admission: RE | Disposition: A | Discharge status: Home / Self Care | Payer: Self-pay | Source: Ambulatory Visit | Attending: Thoracic Surgery (Cardiothoracic Vascular Surgery)

## 2022-03-08 ENCOUNTER — Inpatient Hospital Stay (HOSPITAL_COMMUNITY)
Admission: RE | Admit: 2022-03-08 | Discharge: 2022-03-10 | DRG: 164 | Disposition: A | Discharge status: Home / Self Care | Payer: Commercial Managed Care - HMO | Source: Ambulatory Visit | Attending: Thoracic Surgery (Cardiothoracic Vascular Surgery) | Admitting: Thoracic Surgery (Cardiothoracic Vascular Surgery)

## 2022-03-08 DIAGNOSIS — Z9889 Other specified postprocedural states: Secondary | ICD-10-CM

## 2022-03-08 DIAGNOSIS — D62 Acute posthemorrhagic anemia: Secondary | ICD-10-CM | POA: Diagnosis not present

## 2022-03-08 DIAGNOSIS — Z9049 Acquired absence of other specified parts of digestive tract: Secondary | ICD-10-CM

## 2022-03-08 DIAGNOSIS — Z7951 Long term (current) use of inhaled steroids: Secondary | ICD-10-CM | POA: Diagnosis not present

## 2022-03-08 DIAGNOSIS — J4540 Moderate persistent asthma, uncomplicated: Secondary | ICD-10-CM | POA: Diagnosis present

## 2022-03-08 DIAGNOSIS — J939 Pneumothorax, unspecified: Secondary | ICD-10-CM | POA: Diagnosis not present

## 2022-03-08 DIAGNOSIS — E785 Hyperlipidemia, unspecified: Secondary | ICD-10-CM | POA: Diagnosis present

## 2022-03-08 DIAGNOSIS — Z79899 Other long term (current) drug therapy: Secondary | ICD-10-CM

## 2022-03-08 DIAGNOSIS — K219 Gastro-esophageal reflux disease without esophagitis: Secondary | ICD-10-CM | POA: Diagnosis present

## 2022-03-08 DIAGNOSIS — G4733 Obstructive sleep apnea (adult) (pediatric): Secondary | ICD-10-CM | POA: Diagnosis present

## 2022-03-08 DIAGNOSIS — D72828 Other elevated white blood cell count: Secondary | ICD-10-CM | POA: Diagnosis present

## 2022-03-08 DIAGNOSIS — J849 Interstitial pulmonary disease, unspecified: Secondary | ICD-10-CM

## 2022-03-08 DIAGNOSIS — Z1152 Encounter for screening for COVID-19: Secondary | ICD-10-CM

## 2022-03-08 DIAGNOSIS — F411 Generalized anxiety disorder: Secondary | ICD-10-CM | POA: Diagnosis present

## 2022-03-08 DIAGNOSIS — K449 Diaphragmatic hernia without obstruction or gangrene: Secondary | ICD-10-CM | POA: Diagnosis present

## 2022-03-08 DIAGNOSIS — Z825 Family history of asthma and other chronic lower respiratory diseases: Secondary | ICD-10-CM | POA: Diagnosis not present

## 2022-03-08 DIAGNOSIS — E559 Vitamin D deficiency, unspecified: Secondary | ICD-10-CM | POA: Diagnosis present

## 2022-03-08 DIAGNOSIS — Z01818 Encounter for other preprocedural examination: Secondary | ICD-10-CM

## 2022-03-08 DIAGNOSIS — I10 Essential (primary) hypertension: Secondary | ICD-10-CM | POA: Diagnosis present

## 2022-03-08 DIAGNOSIS — Z87891 Personal history of nicotine dependence: Secondary | ICD-10-CM | POA: Diagnosis not present

## 2022-03-08 HISTORY — DX: Other specified postprocedural states: Z98.890

## 2022-03-08 HISTORY — PX: INTERCOSTAL NERVE BLOCK: SHX5021

## 2022-03-08 LAB — ABO/RH: ABO/RH(D): A POS

## 2022-03-08 LAB — POCT PREGNANCY, URINE: Preg Test, Ur: NEGATIVE

## 2022-03-08 SURGERY — WEDGE RESECTION, LUNG, ROBOT-ASSISTED, THORACOSCOPIC
Anesthesia: General | Site: Chest | Laterality: Right

## 2022-03-08 MED ORDER — LUNG SURGERY BOOK
Freq: Once | Status: DC
  Filled 2022-03-08: qty 1

## 2022-03-08 MED ORDER — SENNOSIDES-DOCUSATE SODIUM 8.6-50 MG PO TABS
1.0000 | ORAL_TABLET | Freq: Every day | ORAL | Status: DC
  Administered 2022-03-08: 1 via ORAL
  Filled 2022-03-08 (×2): qty 1

## 2022-03-08 MED ORDER — MIDAZOLAM HCL 2 MG/2ML IJ SOLN
INTRAMUSCULAR | Status: AC
  Filled 2022-03-08: qty 2

## 2022-03-08 MED ORDER — EPHEDRINE SULFATE-NACL 50-0.9 MG/10ML-% IV SOSY
PREFILLED_SYRINGE | INTRAVENOUS | Status: DC | PRN
  Administered 2022-03-08: 5 mg via INTRAVENOUS

## 2022-03-08 MED ORDER — LACTATED RINGERS IV SOLN: INTRAVENOUS | Status: DC

## 2022-03-08 MED ORDER — LIDOCAINE 2% (20 MG/ML) 5 ML SYRINGE
INTRAMUSCULAR | Status: DC | PRN
  Administered 2022-03-08: 60 mg via INTRAVENOUS

## 2022-03-08 MED ORDER — HYDROXYZINE HCL 25 MG PO TABS
50.0000 mg | ORAL_TABLET | Freq: Two times a day (BID) | ORAL | Status: DC
  Administered 2022-03-09 – 2022-03-10 (×3): 50 mg via ORAL
  Filled 2022-03-08 (×3): qty 2

## 2022-03-08 MED ORDER — KETOROLAC TROMETHAMINE 15 MG/ML IJ SOLN
15.0000 mg | Freq: Four times a day (QID) | INTRAMUSCULAR | Status: DC
  Administered 2022-03-08 – 2022-03-10 (×6): 15 mg via INTRAVENOUS
  Filled 2022-03-08 (×7): qty 1

## 2022-03-08 MED ORDER — ROSUVASTATIN CALCIUM 5 MG PO TABS
10.0000 mg | ORAL_TABLET | Freq: Every day | ORAL | Status: DC
  Administered 2022-03-09 – 2022-03-10 (×2): 10 mg via ORAL
  Filled 2022-03-08 (×2): qty 2

## 2022-03-08 MED ORDER — ACETAMINOPHEN 160 MG/5ML PO SOLN: 1000.0000 mg | Freq: Four times a day (QID) | ORAL | Status: DC

## 2022-03-08 MED ORDER — IPRATROPIUM-ALBUTEROL 0.5-2.5 (3) MG/3ML IN SOLN
3.0000 mL | Freq: Four times a day (QID) | RESPIRATORY_TRACT | Status: DC
  Administered 2022-03-08 – 2022-03-10 (×5): 3 mL via RESPIRATORY_TRACT
  Filled 2022-03-08 (×6): qty 3

## 2022-03-08 MED ORDER — LACTATED RINGERS IV SOLN: INTRAVENOUS | Status: DC | PRN

## 2022-03-08 MED ORDER — TRAMADOL HCL 50 MG PO TABS
50.0000 mg | ORAL_TABLET | Freq: Four times a day (QID) | ORAL | Status: DC | PRN
  Administered 2022-03-08: 100 mg via ORAL
  Administered 2022-03-10: 50 mg via ORAL
  Filled 2022-03-08: qty 2
  Filled 2022-03-08: qty 1
  Filled 2022-03-08: qty 2

## 2022-03-08 MED ORDER — ACETAMINOPHEN 500 MG PO TABS
1000.0000 mg | ORAL_TABLET | Freq: Once | ORAL | Status: AC
  Administered 2022-03-08: 1000 mg via ORAL
  Filled 2022-03-08: qty 2

## 2022-03-08 MED ORDER — PHENYLEPHRINE HCL-NACL 20-0.9 MG/250ML-% IV SOLN
INTRAVENOUS | Status: DC | PRN
  Administered 2022-03-08: 60 ug/min via INTRAVENOUS

## 2022-03-08 MED ORDER — AMISULPRIDE (ANTIEMETIC) 5 MG/2ML IV SOLN: 10.0000 mg | Freq: Once | INTRAVENOUS | Status: DC | PRN

## 2022-03-08 MED ORDER — ONDANSETRON HCL 4 MG/2ML IJ SOLN
INTRAMUSCULAR | Status: DC | PRN
  Administered 2022-03-08: 4 mg via INTRAVENOUS

## 2022-03-08 MED ORDER — FENTANYL CITRATE (PF) 100 MCG/2ML IJ SOLN
INTRAMUSCULAR | Status: AC
  Filled 2022-03-08: qty 2

## 2022-03-08 MED ORDER — PANTOPRAZOLE SODIUM 40 MG PO TBEC
40.0000 mg | DELAYED_RELEASE_TABLET | Freq: Every day | ORAL | Status: DC
  Administered 2022-03-09 – 2022-03-10 (×2): 40 mg via ORAL
  Filled 2022-03-08 (×2): qty 1

## 2022-03-08 MED ORDER — CEFAZOLIN SODIUM-DEXTROSE 2-4 GM/100ML-% IV SOLN
INTRAVENOUS | Status: AC
  Filled 2022-03-08: qty 100

## 2022-03-08 MED ORDER — LORATADINE 10 MG PO TABS
10.0000 mg | ORAL_TABLET | Freq: Every day | ORAL | Status: DC
  Administered 2022-03-09 – 2022-03-10 (×2): 10 mg via ORAL
  Filled 2022-03-08 (×2): qty 1

## 2022-03-08 MED ORDER — EPHEDRINE 5 MG/ML INJ
INTRAVENOUS | Status: AC
  Filled 2022-03-08: qty 5

## 2022-03-08 MED ORDER — ENOXAPARIN SODIUM 40 MG/0.4ML IJ SOSY
40.0000 mg | PREFILLED_SYRINGE | Freq: Every day | INTRAMUSCULAR | Status: DC
  Filled 2022-03-08: qty 0.4

## 2022-03-08 MED ORDER — 0.9 % SODIUM CHLORIDE (POUR BTL) OPTIME
TOPICAL | Status: DC | PRN
  Administered 2022-03-08: 2000 mL

## 2022-03-08 MED ORDER — FENTANYL CITRATE (PF) 250 MCG/5ML IJ SOLN
INTRAMUSCULAR | Status: AC
  Filled 2022-03-08: qty 5

## 2022-03-08 MED ORDER — MORPHINE SULFATE (PF) 2 MG/ML IV SOLN: 2.0000 mg | INTRAVENOUS | Status: DC | PRN

## 2022-03-08 MED ORDER — ROCURONIUM BROMIDE 10 MG/ML (PF) SYRINGE
PREFILLED_SYRINGE | INTRAVENOUS | Status: AC
  Filled 2022-03-08: qty 10

## 2022-03-08 MED ORDER — PROPOFOL 10 MG/ML IV BOLUS
INTRAVENOUS | Status: DC | PRN
  Administered 2022-03-08: 200 mg via INTRAVENOUS

## 2022-03-08 MED ORDER — CEFAZOLIN SODIUM-DEXTROSE 2-4 GM/100ML-% IV SOLN
2.0000 g | Freq: Three times a day (TID) | INTRAVENOUS | Status: AC
  Administered 2022-03-08 – 2022-03-09 (×2): 2 g via INTRAVENOUS
  Filled 2022-03-08 (×2): qty 100

## 2022-03-08 MED ORDER — BUPIVACAINE HCL (PF) 0.5 % IJ SOLN
INTRAMUSCULAR | Status: AC
  Filled 2022-03-08: qty 30

## 2022-03-08 MED ORDER — FENTANYL CITRATE (PF) 250 MCG/5ML IJ SOLN
INTRAMUSCULAR | Status: DC | PRN
  Administered 2022-03-08: 50 ug via INTRAVENOUS
  Administered 2022-03-08: 100 ug via INTRAVENOUS

## 2022-03-08 MED ORDER — MIDAZOLAM HCL 2 MG/2ML IJ SOLN
INTRAMUSCULAR | Status: DC | PRN
  Administered 2022-03-08: 2 mg via INTRAVENOUS

## 2022-03-08 MED ORDER — BISACODYL 5 MG PO TBEC
10.0000 mg | DELAYED_RELEASE_TABLET | Freq: Every day | ORAL | Status: DC
  Filled 2022-03-08 (×2): qty 2

## 2022-03-08 MED ORDER — ORAL CARE MOUTH RINSE: 15.0000 mL | Freq: Once | OROMUCOSAL | Status: AC

## 2022-03-08 MED ORDER — CEFAZOLIN SODIUM-DEXTROSE 2-4 GM/100ML-% IV SOLN
2.0000 g | INTRAVENOUS | Status: AC
  Administered 2022-03-08: 2 g via INTRAVENOUS

## 2022-03-08 MED ORDER — DEXAMETHASONE SODIUM PHOSPHATE 10 MG/ML IJ SOLN
INTRAMUSCULAR | Status: AC
  Filled 2022-03-08: qty 1

## 2022-03-08 MED ORDER — LIDOCAINE 2% (20 MG/ML) 5 ML SYRINGE
INTRAMUSCULAR | Status: AC
  Filled 2022-03-08: qty 5

## 2022-03-08 MED ORDER — CHLORHEXIDINE GLUCONATE 0.12 % MT SOLN: 15.0000 mL | Freq: Once | OROMUCOSAL | Status: AC

## 2022-03-08 MED ORDER — FENTANYL CITRATE (PF) 100 MCG/2ML IJ SOLN
25.0000 ug | INTRAMUSCULAR | Status: DC | PRN
  Administered 2022-03-08 (×4): 25 ug via INTRAVENOUS

## 2022-03-08 MED ORDER — DEXAMETHASONE SODIUM PHOSPHATE 10 MG/ML IJ SOLN
INTRAMUSCULAR | Status: DC | PRN
  Administered 2022-03-08: 10 mg via INTRAVENOUS

## 2022-03-08 MED ORDER — ROCURONIUM BROMIDE 10 MG/ML (PF) SYRINGE
PREFILLED_SYRINGE | INTRAVENOUS | Status: DC | PRN
  Administered 2022-03-08: 20 mg via INTRAVENOUS
  Administered 2022-03-08: 60 mg via INTRAVENOUS

## 2022-03-08 MED ORDER — OXYCODONE HCL 5 MG/5ML PO SOLN: 5.0000 mg | Freq: Once | ORAL | Status: DC | PRN

## 2022-03-08 MED ORDER — LEVOCETIRIZINE DIHYDROCHLORIDE 5 MG PO TABS: 5.0000 mg | ORAL_TABLET | Freq: Every evening | ORAL | Status: DC

## 2022-03-08 MED ORDER — PHENYLEPHRINE 80 MCG/ML (10ML) SYRINGE FOR IV PUSH (FOR BLOOD PRESSURE SUPPORT)
PREFILLED_SYRINGE | INTRAVENOUS | Status: DC | PRN
  Administered 2022-03-08: 80 ug via INTRAVENOUS

## 2022-03-08 MED ORDER — DEXMEDETOMIDINE HCL IN NACL 80 MCG/20ML IV SOLN
INTRAVENOUS | Status: AC
  Filled 2022-03-08: qty 20

## 2022-03-08 MED ORDER — KETOROLAC TROMETHAMINE 30 MG/ML IJ SOLN
INTRAMUSCULAR | Status: AC
  Filled 2022-03-08: qty 1

## 2022-03-08 MED ORDER — SODIUM CHLORIDE FLUSH 0.9 % IV SOLN
INTRAVENOUS | Status: DC | PRN
  Administered 2022-03-08: 100 mL

## 2022-03-08 MED ORDER — SUGAMMADEX SODIUM 200 MG/2ML IV SOLN
INTRAVENOUS | Status: DC | PRN
  Administered 2022-03-08: 200 mg via INTRAVENOUS

## 2022-03-08 MED ORDER — ONDANSETRON HCL 4 MG/2ML IJ SOLN: 4.0000 mg | Freq: Four times a day (QID) | INTRAMUSCULAR | Status: DC | PRN

## 2022-03-08 MED ORDER — BUPIVACAINE LIPOSOME 1.3 % IJ SUSP
INTRAMUSCULAR | Status: AC
  Filled 2022-03-08: qty 20

## 2022-03-08 MED ORDER — ACETAMINOPHEN 500 MG PO TABS
1000.0000 mg | ORAL_TABLET | Freq: Four times a day (QID) | ORAL | Status: DC
  Administered 2022-03-08 – 2022-03-10 (×8): 1000 mg via ORAL
  Filled 2022-03-08 (×8): qty 2

## 2022-03-08 MED ORDER — OXYCODONE HCL 5 MG PO TABS: 5.0000 mg | ORAL_TABLET | Freq: Once | ORAL | Status: DC | PRN

## 2022-03-08 MED ORDER — KETOROLAC TROMETHAMINE 30 MG/ML IJ SOLN
30.0000 mg | Freq: Once | INTRAMUSCULAR | Status: AC
  Administered 2022-03-08: 30 mg via INTRAVENOUS

## 2022-03-08 MED ORDER — CHLORHEXIDINE GLUCONATE 0.12 % MT SOLN
OROMUCOSAL | Status: AC
  Administered 2022-03-08: 15 mL via OROMUCOSAL
  Filled 2022-03-08: qty 15

## 2022-03-08 MED ORDER — PROPOFOL 10 MG/ML IV BOLUS
INTRAVENOUS | Status: AC
  Filled 2022-03-08: qty 20

## 2022-03-08 MED ORDER — PROMETHAZINE HCL 25 MG/ML IJ SOLN: 6.2500 mg | INTRAMUSCULAR | Status: DC | PRN

## 2022-03-08 MED ORDER — ONDANSETRON HCL 4 MG/2ML IJ SOLN
INTRAMUSCULAR | Status: AC
  Filled 2022-03-08: qty 2

## 2022-03-08 SURGICAL SUPPLY — 76 items
CANISTER SUCT 3000ML PPV (MISCELLANEOUS) ×2 IMPLANT
CANNULA REDUC XI 12-8 STAPL (CANNULA) ×2
CANNULA REDUCER 12-8 DVNC XI (CANNULA) ×2 IMPLANT
CATH THORACIC 28FR (CATHETERS) IMPLANT
CHLORAPREP W/TINT 26 (MISCELLANEOUS) ×1 IMPLANT
CNTNR URN SCR LID CUP LEK RST (MISCELLANEOUS) ×5 IMPLANT
CONT SPEC 4OZ STRL OR WHT (MISCELLANEOUS) ×5
DEFOGGER SCOPE WARMER CLEARIFY (MISCELLANEOUS) ×1 IMPLANT
DERMABOND ADVANCED .7 DNX12 (GAUZE/BANDAGES/DRESSINGS) ×1 IMPLANT
DRAPE ARM DVNC X/XI (DISPOSABLE) ×4 IMPLANT
DRAPE COLUMN DVNC XI (DISPOSABLE) ×1 IMPLANT
DRAPE CV SPLIT W-CLR ANES SCRN (DRAPES) ×1 IMPLANT
DRAPE DA VINCI XI ARM (DISPOSABLE) ×4
DRAPE DA VINCI XI COLUMN (DISPOSABLE) ×1
DRAPE HALF SHEET 40X57 (DRAPES) ×1 IMPLANT
DRAPE ORTHO SPLIT 77X108 STRL (DRAPES) ×1
DRAPE SURG ORHT 6 SPLT 77X108 (DRAPES) ×1 IMPLANT
ELECT REM PT RETURN 9FT ADLT (ELECTROSURGICAL) ×1
ELECTRODE REM PT RTRN 9FT ADLT (ELECTROSURGICAL) ×1 IMPLANT
GAUZE KITTNER 4X5 RF (MISCELLANEOUS) ×1 IMPLANT
GAUZE SPONGE 4X4 12PLY STRL (GAUZE/BANDAGES/DRESSINGS) ×1 IMPLANT
GLOVE BIO SURGEON STRL SZ7.5 (GLOVE) ×2 IMPLANT
GOWN STRL REUS W/ TWL LRG LVL3 (GOWN DISPOSABLE) ×2 IMPLANT
GOWN STRL REUS W/ TWL XL LVL3 (GOWN DISPOSABLE) ×2 IMPLANT
GOWN STRL REUS W/TWL 2XL LVL3 (GOWN DISPOSABLE) ×1 IMPLANT
GOWN STRL REUS W/TWL LRG LVL3 (GOWN DISPOSABLE) ×2
GOWN STRL REUS W/TWL XL LVL3 (GOWN DISPOSABLE) ×2
HEMOSTAT SURGICEL 2X14 (HEMOSTASIS) ×3 IMPLANT
KIT BASIN OR (CUSTOM PROCEDURE TRAY) ×1 IMPLANT
KIT TURNOVER KIT B (KITS) ×1 IMPLANT
NDL 22X1.5 STRL (OR ONLY) (MISCELLANEOUS) ×1 IMPLANT
NDL HYPO 25GX1X1/2 BEV (NEEDLE) ×1 IMPLANT
NEEDLE 22X1.5 STRL (OR ONLY) (MISCELLANEOUS) ×1 IMPLANT
NEEDLE HYPO 25GX1X1/2 BEV (NEEDLE) ×1 IMPLANT
NS IRRIG 1000ML POUR BTL (IV SOLUTION) ×3 IMPLANT
PACK CHEST (CUSTOM PROCEDURE TRAY) ×1 IMPLANT
PAD ARMBOARD 7.5X6 YLW CONV (MISCELLANEOUS) ×5 IMPLANT
PORT ACCESS TROCAR AIRSEAL 12 (TROCAR) ×1 IMPLANT
RELOAD STAPLE 45 3.5 BLU DVNC (STAPLE) IMPLANT
RELOAD STAPLE 45 4.6 BLK DVNC (STAPLE) IMPLANT
RELOAD STAPLER 3.5X45 BLU DVNC (STAPLE) ×5 IMPLANT
RELOAD STAPLER 45 4.6 BLK DVNC (STAPLE) ×3 IMPLANT
SEAL CANN UNIV 5-8 DVNC XI (MISCELLANEOUS) ×2 IMPLANT
SEAL XI 5MM-8MM UNIVERSAL (MISCELLANEOUS) ×2
SET TRI-LUMEN FLTR TB AIRSEAL (TUBING) ×1 IMPLANT
SOLUTION ELECTROLUBE (MISCELLANEOUS) IMPLANT
STAPLER 45 DA VINCI SURE FORM (STAPLE) ×1
STAPLER 45 SUREFORM DVNC (STAPLE) IMPLANT
STAPLER CANNULA SEAL DVNC XI (STAPLE) ×2 IMPLANT
STAPLER CANNULA SEAL XI (STAPLE) ×2
STAPLER RELOAD 3.5X45 BLU DVNC (STAPLE) ×5
STAPLER RELOAD 3.5X45 BLUE (STAPLE) ×5
STAPLER RELOAD 45 4.6 BLK (STAPLE) ×3
STAPLER RELOAD 45 4.6 BLK DVNC (STAPLE) ×3
STOPCOCK 4 WAY LG BORE MALE ST (IV SETS) ×1 IMPLANT
SUT PDS AB 1 CTX 36 (SUTURE) IMPLANT
SUT PROLENE 4 0 RB 1 (SUTURE)
SUT PROLENE 4-0 RB1 .5 CRCL 36 (SUTURE) IMPLANT
SUT SILK  1 MH (SUTURE) ×1
SUT SILK 1 MH (SUTURE) ×1 IMPLANT
SUT VIC AB 2-0 CT1 27 (SUTURE) ×1
SUT VIC AB 2-0 CT1 TAPERPNT 27 (SUTURE) ×1 IMPLANT
SUT VIC AB 3-0 SH 27 (SUTURE) ×2
SUT VIC AB 3-0 SH 27X BRD (SUTURE) ×3 IMPLANT
SUT VICRYL 0 TIES 12 18 (SUTURE) ×1 IMPLANT
SUT VICRYL 0 UR6 27IN ABS (SUTURE) ×2 IMPLANT
SYR 10ML LL (SYRINGE) ×1 IMPLANT
SYR 20ML LL LF (SYRINGE) ×1 IMPLANT
SYR 50ML LL SCALE MARK (SYRINGE) ×1 IMPLANT
SYSTEM SAHARA CHEST DRAIN ATS (WOUND CARE) ×1 IMPLANT
TAPE CLOTH 4X10 WHT NS (GAUZE/BANDAGES/DRESSINGS) ×1 IMPLANT
TIP APPLICATOR SPRAY EXTEND 16 (VASCULAR PRODUCTS) IMPLANT
TOWEL GREEN STERILE (TOWEL DISPOSABLE) ×1 IMPLANT
TRAY FOLEY MTR SLVR 16FR STAT (SET/KITS/TRAYS/PACK) ×1 IMPLANT
TUBING EXTENTION W/L.L. (IV SETS) ×1 IMPLANT
WATER STERILE IRR 1000ML POUR (IV SOLUTION) ×1 IMPLANT

## 2022-03-08 NOTE — Op Note (Signed)
[address]       [place] [address]             [phone]        03/08/2022  Patient:  Hannah Klein Pre-Op Dx: Interstitial Lung Disease   Post-op Dx:  same Procedure: - Bronchoscopy - Robotic assisted right video thoracoscopy - Wedge resection of the right upper, lower, and middle lobes - Intercostal nerve block  Surgeon and Role:      * Aneira Cavitt, Lucile Crater, MD - Primary Assistant: B. Stehler, PA-C  Anesthesia  general EBL:  44ml Blood Administration: none Specimen:  right upper, middle, and lower lobes  Drains: 27 F argyle chest tube in chest Counts: correct   Indications: 47 year old female with an interstitial lung disease pattern, and significant dyspnea with exertion.  I personally reviewed her cross-sectional imaging, and explained that the only way to obtain a definitive diagnosis would be through surgical biopsy.  She is agreeable to proceed with a right video-assisted thoracoscopy with wedge resection.  We discussed the risks and benefits and possibility this procedure eliciting worsening her symptoms.  She is agreeable to proceed.   In regards to her hiatal hernia, I do not think that her radiographic findings are consistent with chronic aspiration.  In either case the biopsy will help US obtain a definitive diagnosis.  Findings: Fibrotic parenchyma  Operative Technique: After the risks, benefits and alternatives were thoroughly discussed, the patient was brought to the operative theatre.  Anesthesia was induced, and the  patient was then placed in a left decubitus position and was prepped and draped in normal sterile fashion.  An appropriate surgical pause was performed, and pre-operative antibiotics were dosed accordingly.  We began by placing our 3 robotic ports in the the 7th intercostal space targeting the hilum of the lung.  The robot was then docked and all instruments were passed under direct visualization.  Wedge  resections of all 3 lobes were performed.  An intercostal nerve block was performed under direct visualization.  A mechanical pleurodesis was then performed.  A 27F chest with then placed, and we watch the remaining lobes re-expand.  The skin and soft tissue were closed with absorbable suture.    The patient tolerated the procedure without any immediate complications, and was transferred to the PACU in stable condition.  Zhanae Proffit Bary Leriche

## 2022-03-08 NOTE — Anesthesia Postprocedure Evaluation (Signed)
Anesthesia Post Note  Patient: TYLISA ALCIVAR  Procedure(s) Performed: RIGHT XI ROBOTIC ASSISTED THORACOSCOPY-WEDGE RESECTION (Right: Chest) INTERCOSTAL NERVE BLOCK (Right: Chest)     Patient location during evaluation: PACU Anesthesia Type: General Level of consciousness: awake Pain management: pain level controlled Vital Signs Assessment: post-procedure vital signs reviewed and stable Respiratory status: spontaneous breathing, nonlabored ventilation and respiratory function stable Cardiovascular status: blood pressure returned to baseline and stable Postop Assessment: no apparent nausea or vomiting Anesthetic complications: no   No notable events documented.  Last Vitals:  Vitals:   03/08/22 1545 03/08/22 1600  BP: (!) 154/95 (!) 153/90  Pulse: 60 61  Resp: 14 17  Temp:  36.7 C  SpO2: 100% 100%    Last Pain:  Vitals:   03/08/22 1530  TempSrc:   PainSc: 9                  Darbi Chandran P Chizara Mena

## 2022-03-08 NOTE — Hospital Course (Addendum)
History of Present Illness:    Hannah Klein 47 y.o. female presents for surgical evaluation and interstitial lung process.  She states that over the past 4 to 5 months she has had progressive shortness of breath, and can now barely walk short distances without having to stop.  She does have a long history of asthma, but her symptoms have progressed significantly over the last year.   She also has a history of gastroesophageal reflux, and a small hiatal hernia.  Hospital Course:  [name] arrived at [facility] and was brought to the operating room on 03/08/22. She underwent robotic assisted wedge resection of the right upper lobe, right middle lobe and right lower lobe. She tolerated the procedure well and was transferred to the PACU in stable condition.  The patient's chest tube was free from air leak.  She had no evidence of pneumothorax on her CXR.  Her chest output was low.  Her chest tube was removed on 03/09/2021.  Follow up CXR showed development of pneumothorax that was small about 10-15% and subcutaneous emphysema along her chest and into her right neck.  She was observed clinically with repeat CXR showing the findings to be quite stable.  She maintained good oxygen saturations on room air.  Incisions were healing well without evidence of infection.  She was tolerating diet and routine activities..  Renal function remained within normal limits.  She had a very minor expected acute blood loss anemia which was stable.  Overall, at the time of discharge the patient was felt to be quite stable.

## 2022-03-08 NOTE — Anesthesia Preprocedure Evaluation (Addendum)
Anesthesia Evaluation  Patient identified by MRN, date of birth, ID band Patient awake    Reviewed: Allergy & Precautions, NPO status , Patient's Chart, lab work & pertinent test results  Airway Mallampati: II  TM Distance: >3 FB Neck ROM: Full    Dental no notable dental hx.    Pulmonary asthma , sleep apnea , former smoker   Pulmonary exam normal        Cardiovascular hypertension, Normal cardiovascular exam     Neuro/Psych  Headaches PSYCHIATRIC DISORDERS Anxiety        GI/Hepatic Neg liver ROS, hiatal hernia,GERD  Medicated and Controlled,,  Endo/Other  negative endocrine ROS    Renal/GU negative Renal ROS     Musculoskeletal  (+) Arthritis ,    Abdominal  (+) + obese  Peds  Hematology negative hematology ROS (+)   Anesthesia Other Findings ILD  Reproductive/Obstetrics Hcg negative                             Anesthesia Physical Anesthesia Plan  ASA: 2  Anesthesia Plan: General   Post-op Pain Management:    Induction: Intravenous  PONV Risk Score and Plan: 3 and Ondansetron, Dexamethasone, Midazolam and Treatment may vary due to age or medical condition  Airway Management Planned: Double Lumen EBT  Additional Equipment: ClearSight  Intra-op Plan:   Post-operative Plan: Extubation in OR  Informed Consent: I have reviewed the patients History and Physical, chart, labs and discussed the procedure including the risks, benefits and alternatives for the proposed anesthesia with the patient or authorized representative who has indicated his/her understanding and acceptance.     Dental advisory given  Plan Discussed with: CRNA  Anesthesia Plan Comments:        Anesthesia Quick Evaluation

## 2022-03-08 NOTE — Plan of Care (Signed)
  Problem: Education: Goal: Knowledge of disease or condition will improve Outcome: Progressing   Problem: Education: Goal: Knowledge of General Education information will improve Description: Including pain rating scale, medication(s)/side effects and non-pharmacologic comfort measures Outcome: Progressing   Problem: Clinical Measurements: Goal: Respiratory complications will improve Outcome: Progressing Goal: Cardiovascular complication will be avoided Outcome: Progressing   Problem: Pain Managment: Goal: General experience of comfort will improve Outcome: Progressing

## 2022-03-08 NOTE — Brief Op Note (Signed)
03/08/2022  12:33 PM  PATIENT:  [name]  47 y.o. female  PRE-OPERATIVE DIAGNOSIS:  Interstitial Lung Disease  POST-OPERATIVE DIAGNOSIS:  Interstitial Lung Disease  PROCEDURE:  RIGHT XI ROBOTIC ASSISTED THORACOSCOPY-WEDGE RESECTION (Right) INTERCOSTAL NERVE BLOCK (Right)  SURGEON:  Surgeon(s) and Role:  Lightfoot, Lucile Crater, MD - Primary  PHYSICIAN ASSISTANT: Wynelle Beckmann PA-C  ASSISTANTS: none   ANESTHESIA:   local and general  EBL:  Minimal  BLOOD ADMINISTERED:none  DRAINS:  Right pleural chest tube    LOCAL MEDICATIONS USED:  OTHER Exparel  SPECIMEN:  Source of Specimen:  RUL, RML,RLL  DISPOSITION OF SPECIMEN:  PATHOLOGY  COUNTS CORRECT:  YES  DICTATION: .Dragon Dictation  PLAN OF CARE: Admit to inpatient   PATIENT DISPOSITION:  PACU - hemodynamically stable.   Delay start of Pharmacological VTE agent (>24hrs) due to surgical blood loss or risk of bleeding: no

## 2022-03-08 NOTE — Transfer of Care (Signed)
Immediate Anesthesia Transfer of Care Note  [name]  Procedure(s) Performed: RIGHT XI ROBOTIC ASSISTED THORACOSCOPY-WEDGE RESECTION (Right: Chest) INTERCOSTAL NERVE BLOCK (Right: Chest)  Patient Location: PACU  Anesthesia Type:General  Level of Consciousness: awake, alert , and oriented  Airway & Oxygen Therapy: Patient Spontanous Breathing  Post-op Assessment: Report given to RN and Post -op Vital signs reviewed and stable  Post vital signs: Reviewed and stable  Last Vitals:  Vitals Value Taken Time  BP 117/107 03/08/22 1500  Temp    Pulse 71 03/08/22 1504  Resp 20 03/08/22 1504  SpO2 99 % 03/08/22 1504  Vitals shown include unvalidated device data.  Last Pain:  Vitals:   03/08/22 1024  TempSrc:   PainSc: 4          Complications: No notable events documented.

## 2022-03-08 NOTE — Progress Notes (Signed)
   03/08/22 1100  Spiritual Encounters  Type of Visit Initial  Care provided to: Patient  Conversation partners present during encounter Nurse  Referral source Nurse (RN/NT/LPN)  Reason for visit Advance directives   Chaplain paged to Short Stay to assist a pt finalize an Advance Directive, which was successfully completed and notarized. Hubert Azure, [place]  Pager [phone]

## 2022-03-08 NOTE — Discharge Instructions (Signed)
Robot-Assisted Thoracic Surgery, Care After The following information offers guidance on how to care for yourself after your procedure. Your health care provider may also give you more specific instructions. If you have problems or questions, contact your health care provider. What can I expect after the procedure? After the procedure, it is common to have: Some pain and aches in the area of your surgical incisions. Pain when breathing in (inhaling) and coughing. Tiredness (fatigue). Trouble sleeping. Constipation. Follow these instructions at home: Medicines Take over-the-counter and prescription medicines only as told by your health care provider. If you were prescribed an antibiotic medicine, take it as told by your health care provider. Do not stop taking the antibiotic even if you start to feel better. Talk with your health care provider about safe and effective ways to manage pain after your procedure. Pain management should fit your specific health needs. Take pain medicine before pain becomes severe. Relieving and controlling your pain will make breathing easier for you. Ask your health care provider if the medicine prescribed to you requires you to avoid driving or using machinery. Eating and drinking Follow instructions from your health care provider about eating or drinking restrictions. These will vary depending on what procedure you had. Your health care provider may recommend: A liquid diet or soft diet for the first few days. Meals that are smaller and more frequent. A diet of fruits, vegetables, whole grains, and low-fat proteins. Limiting foods that are high in fat and processed sugar, including fried or sweet foods. Incision care Follow instructions from your health care provider about how to take care of your incisions. Make sure you: Wash your hands with soap and water for at least 20 seconds before and after you change your bandage (dressing). If soap and water are not  available, use hand sanitizer. Change your dressing as told by your health care provider. Leave stitches (sutures), skin glue, or adhesive strips in place. These skin closures may need to stay in place for 2 weeks or longer. If adhesive strip edges start to loosen and curl up, you may trim the loose edges. Do not remove adhesive strips completely unless your health care provider tells you to do that. Check your incision area every day for signs of infection. Check for: Redness, swelling, or more pain. Fluid or blood. Warmth. Pus or a bad smell. Activity Return to your normal activities as told by your health care provider. Ask your health care provider what activities are safe for you. Ask your health care provider when it is safe for you to drive. Do not lift anything that is heavier than 10 lb (4.5 kg), or the limit that you are told, until your health care provider says that it is safe. Rest as told by your health care provider. Avoid sitting for a long time without moving. Get up to take short walks every 1-2 hours. This is important to improve blood flow and breathing. Ask for help if you feel weak or unsteady. Do exercises as told by your health care provider. Pneumonia prevention  Do deep breathing exercises and cough regularly as directed. This helps clear mucus and opens your lungs. Doing this helps prevent lung infection (pneumonia). If you were given an incentive spirometer, use it as told. An incentive spirometer is a tool that measures how well you are filling your lungs with each breath. Coughing may hurt less if you try to support your chest. This is called splinting. Try one of these when you   cough: Hold a pillow against your chest. Place the palms of both hands on top of your incision area. Do not use any products that contain nicotine or tobacco. These products include cigarettes, chewing tobacco, and vaping devices, such as e-cigarettes. If you need help quitting, ask your  health care provider. Avoid secondhand smoke. General instructions If you have a drainage tube: Follow instructions from your health care provider about how to take care of it. Do not travel by airplane after your tube is removed until your health care provider tells you it is safe. You may need to take these actions to prevent or treat constipation: Drink enough fluid to keep your urine pale yellow. Take over-the-counter or prescription medicines. Eat foods that are high in fiber, such as beans, whole grains, and fresh fruits and vegetables. Limit foods that are high in fat and processed sugars, such as fried or sweet foods. Keep all follow-up visits. This is important. Contact a health care provider if: You have redness, swelling, or more pain around an incision. You have fluid or blood coming from an incision. An incision feels warm to the touch. You have pus or a bad smell coming from an incision. You have a fever. You cannot eat or drink without vomiting. Your pain medicine is not controlling your pain. Get help right away if: You have chest pain. Your heart is beating quickly. You have trouble breathing. You have trouble speaking. You are confused. You feel weak or dizzy, or you faint. These symptoms may represent a serious problem that is an emergency. Do not wait to see if the symptoms will go away. Get medical help right away. Call your local emergency services (911 in the [place] Do not drive yourself to the hospital. Summary Talk with your health care provider about safe and effective ways to manage pain after your procedure. Pain management should fit your specific health needs. Return to your normal activities as told by your health care provider. Ask your health care provider what activities are safe for you. Do deep breathing exercises and cough regularly as directed. This helps to clear mucus and prevent pneumonia. If it hurts to cough, ease pain by holding a pillow  against your chest or by placing the palms of both hands over your incisions. This information is not intended to replace advice given to you by your health care provider. Make sure you discuss any questions you have with your health care provider. Document Revised: 11/06/2019 Document Reviewed: 11/06/2019 Elsevier Patient Education  2023 Elsevier Inc[employer]   

## 2022-03-08 NOTE — Anesthesia Procedure Notes (Signed)
Procedure Name: Intubation Date/Time: 03/08/2022 1:43 PM  Performed by: Murvin Natal, MDPre-anesthesia Checklist: Patient identified, Emergency Drugs available, Suction available and Patient being monitored Patient Re-evaluated:Patient Re-evaluated prior to induction Oxygen Delivery Method: Circle System Utilized Preoxygenation: Pre-oxygenation with 100% oxygen Induction Type: IV induction Ventilation: Mask ventilation without difficulty and Oral airway inserted - appropriate to patient size Laryngoscope Size: Mac and 3 Grade View: Grade I Tube type: Oral Tube size: 7.0 mm Number of attempts: 1 Airway Equipment and Method: Stylet and Oral airway Placement Confirmation: ETT inserted through vocal cords under direct vision, positive ETCO2 and breath sounds checked- equal and bilateral Secured at: 21 cm Tube secured with: Tape Dental Injury: Teeth and Oropharynx as per pre-operative assessment  Comments: Attempt x2 CRNA and MDA to place 28F DLT, unable to pass through cords. Single lumen OETT placed per Dr. Abran Duke request instead of attempting with smaller size DLT.

## 2022-03-08 NOTE — Interval H&P Note (Signed)
History and Physical Interval Note:  03/08/2022 12:40 PM  Hannah Klein  has presented today for surgery, with the diagnosis of ILD.  The various methods of treatment have been discussed with the patient and family. After consideration of risks, benefits and other options for treatment, the patient has consented to  Procedure(s): RIGHT XI ROBOTIC ASSISTED THORACOSCOPY-WEDGE RESECTION (Right) as a surgical intervention.  The patient's history has been reviewed, patient examined, no change in status, stable for surgery.  I have reviewed the patient's chart and labs.  Questions were answered to the patient's satisfaction.     Donyell Ding Bary Leriche

## 2022-03-09 ENCOUNTER — Encounter (HOSPITAL_COMMUNITY): Payer: Self-pay | Admitting: Thoracic Surgery (Cardiothoracic Vascular Surgery)

## 2022-03-09 ENCOUNTER — Inpatient Hospital Stay (HOSPITAL_COMMUNITY): Payer: Commercial Managed Care - HMO

## 2022-03-09 LAB — CBC
HCT: 37.5 % (ref 36.0–46.0)
Hemoglobin: 12.7 g/dL (ref 12.0–15.0)
MCH: 29.5 pg (ref 26.0–34.0)
MCHC: 33.9 g/dL (ref 30.0–36.0)
MCV: 87.2 fL (ref 80.0–100.0)
Platelets: 239 10*3/uL (ref 150–400)
RBC: 4.3 MIL/uL (ref 3.87–5.11)
RDW: 12.9 % (ref 11.5–15.5)
WBC: 12.1 10*3/uL — ABNORMAL HIGH (ref 4.0–10.5)
nRBC: 0 % (ref 0.0–0.2)

## 2022-03-09 LAB — BASIC METABOLIC PANEL
Anion gap: 10 (ref 5–15)
BUN: 9 mg/dL (ref 6–20)
CO2: 23 mmol/L (ref 22–32)
Calcium: 8.9 mg/dL (ref 8.9–10.3)
Chloride: 102 mmol/L (ref 98–111)
Creatinine, Ser: 0.84 mg/dL (ref 0.44–1.00)
GFR, Estimated: >60 mL/min (ref >60)
Glucose, Bld: 126 mg/dL — ABNORMAL HIGH (ref 70–99)
Potassium: 4 mmol/L (ref 3.5–5.1)
Sodium: 135 mmol/L (ref 135–145)

## 2022-03-09 NOTE — Progress Notes (Addendum)
RN called to room pt chest tube site with minimal leak. Site dressing changed replaced with guaze and tape. Pt complained of tenderness in right sided neck and sore throat. RN assessed. Subcutaneous air noted. Pt show no distress, A&O, resting in bed, room air sats 97%. CVTS paged. Awaiting callback.   1155 Call returned by Lyn Henri, PA advise of pt complains. Awaiting orders.  1158 CXR being done at bedside

## 2022-03-09 NOTE — Plan of Care (Signed)
  Problem: Education: Goal: Knowledge of disease or condition will improve 03/09/2022 1611 by Eulis Manly, RN Outcome: Progressing 03/09/2022 1610 by Eulis Manly, RN Outcome: Progressing Goal: Knowledge of the prescribed therapeutic regimen will improve 03/09/2022 1611 by Eulis Manly, RN Outcome: Progressing 03/09/2022 1610 by Eulis Manly, RN Outcome: Progressing   Problem: Activity: Goal: Risk for activity intolerance will decrease Outcome: Progressing   Problem: Cardiac: Goal: Will achieve and/or maintain hemodynamic stability 03/09/2022 1611 by Eulis Manly, RN Outcome: Progressing 03/09/2022 1610 by Eulis Manly, RN Outcome: Progressing   Problem: Clinical Measurements: Goal: Postoperative complications will be avoided or minimized Outcome: Progressing   Problem: Pain Management: Goal: Pain level will decrease 03/09/2022 1611 by Eulis Manly, RN Outcome: Progressing 03/09/2022 1610 by Silvestre Mesi D, RN Outcome: Progressing   Problem: Skin Integrity: Goal: Wound healing without signs and symptoms infection will improve Outcome: Progressing   Problem: Education: Goal: Knowledge of General Education information will improve Description: Including pain rating scale, medication(s)/side effects and non-pharmacologic comfort measures 03/09/2022 1611 by Eulis Manly, RN Outcome: Progressing 03/09/2022 1610 by Eulis Manly, RN Outcome: Progressing   Problem: Health Behavior/Discharge Planning: Goal: Ability to manage health-related needs will improve 03/09/2022 1611 by Eulis Manly, RN Outcome: Progressing 03/09/2022 1610 by Eulis Manly, RN Outcome: Progressing   Problem: Clinical Measurements: Goal: Diagnostic test results will improve Outcome: Progressing   Problem: Activity: Goal: Risk for activity intolerance will decrease 03/09/2022 1611 by Eulis Manly, RN Outcome: Progressing 03/09/2022 1610 by Eulis Manly, RN Outcome: Progressing   Problem: Nutrition: Goal: Adequate nutrition will be maintained 03/09/2022 1611 by Eulis Manly, RN Outcome: Progressing 03/09/2022 1610 by Silvestre Mesi D, RN Outcome: Progressing   Problem: Coping: Goal: Level of anxiety will decrease 03/09/2022 1611 by Eulis Manly, RN Outcome: Progressing 03/09/2022 1610 by Eulis Manly, RN Outcome: Progressing

## 2022-03-09 NOTE — Progress Notes (Addendum)
1 Day Post-Op Procedure(s) (LRB): RIGHT XI ROBOTIC ASSISTED THORACOSCOPY-WEDGE RESECTION (Right) INTERCOSTAL NERVE BLOCK (Right) Subjective: Hannah Klein, 47 yo female, states she feels great. Pt states slight pain on R shoulder (to be expected from CT, diaphragmatic irritation). Pt states "difficulty breathing" when walking to bathroom.  Objective: Vital signs in last 24 hours: Temp:  [97.4 F (36.3 C)-98.5 F (36.9 C)] 97.9 F (36.6 C) (01/12 0710) Pulse Rate:  [59-77] 63 (01/12 0710) Cardiac Rhythm: Normal sinus rhythm (01/12 0715) Resp:  [14-25] 16 (01/12 0710) BP: (117-167)/(77-107) 143/86 (01/12 0710) SpO2:  [85 %-100 %] 94 % (01/12 0710) Weight:  [93.9 kg] 93.9 kg (01/11 0947)  Hemodynamic parameters for last 24 hours:    Intake/Output from previous day: 01/11 0701 - 01/12 0700 In: 2060 [P.O.:360; I.V.:1500; IV Piggyback:200] Out: 2410 [Urine:2200; Blood:20; Chest Tube:190] Intake/Output this shift: Total I/O In: 240 [P.O.:240] Out: 10 [Chest Tube:10]  General appearance: alert, cooperative, and appears stated age Heart: regular rate and rhythm Lungs: wheezes posterior - bilateral Abdomen: normoactive Extremities: no edema or pain Wound: clean and dry  Lab Results: Recent Labs    03/07/22 0834 03/09/22 0025  WBC 9.4 12.1*  HGB 13.6 12.7  HCT 40.7 37.5  PLT 244 239   BMET:  Recent Labs    03/07/22 0834 03/09/22 0025  NA 136 135  K 4.2 4.0  CL 103 102  CO2 25 23  GLUCOSE 90 126*  BUN 8 9  CREATININE 0.82 0.84  CALCIUM 9.1 8.9    PT/INR:  Recent Labs    03/07/22 0834  LABPROT 13.8  INR 1.1   ABG No results found for: "PHART", "HCO3", "TCO2", "ACIDBASEDEF", "O2SAT" CBG (last 3)  No results for input(s): "GLUCAP" in the last 72 hours.  Assessment/Plan: S/P Procedure(s) (LRB): RIGHT XI ROBOTIC ASSISTED THORACOSCOPY-WEDGE RESECTION (Right) INTERCOSTAL NERVE BLOCK (Right)  CV - Normal sinus Rhythm on monitor, pt has been hypertensive  for the past 24 hrs - will monitor (no PMH of HTN) Pulm - spO2 has been stable for the last 10 hrs on room air (dropped to 88% at 1635 and received 2L [place]); CT output - 200cc; CXR = normal for the patient Renal: BMP normal; UO good BG is stable Heme: normal Hgb, plts    LOS: 1 day    Hannah Klein 03/09/2022  Patient doing well.  Pain is well controlled.  She has not yet ambulated yet.  She does have some shoulder discomfort which responds well to pain medication.  She is not sure if this is due to her Bursitis or surgery.  She denies N/V.  Her CXR is free from pneumothorax.  She does have some air space disease/atelectasis bilaterally.  CT is in place with 200 cc output since surgery, there is no air leak present... will likely d/c today, discuss with Dr. Kipp Brood  She has no documented history of HTN.  However her blood pressure has been elevated post operatively.  She will need to follow up with PCP for repeat BP checks in the future and if needed be started on antihypertensive therapy.    If CT is removed today, will repeat CXR and if remains stable, for possible d/c home later today   I agree with above note by PA- Student Hannah Klein  Hannah Baiza, PA-C 03/09/22 8:09 AM

## 2022-03-09 NOTE — TOC Transition Note (Signed)
Transition of Care [facility]) - CM/SW Discharge Note   Patient Details  Name: Hannah Klein MRN: [id number] Date of Birth: [birth year]  [facility]) CM/SW Contact:  Zenon Mayo, RN Phone Number: 03/09/2022, 11:38 AM   Clinical Narrative:     Patient is for possible dc today, chest tube has been dc'd.  She states she does have transport home.          Patient Goals and CMS Choice      Discharge Placement                         Discharge Plan and Services Additional resources added to the After Visit Summary for                                       Social Determinants of Health (SDOH) Interventions SDOH Screenings   Food Insecurity: No Food Insecurity (01/26/2022)  Housing: Low Risk  (01/26/2022)  Transportation Needs: No Transportation Needs (01/26/2022)  Utilities: Not At Risk (01/26/2022)  Depression (PHQ2-9): Low Risk  (06/09/2021)  Tobacco Use: Medium Risk (03/09/2022)     Readmission Risk Interventions     No data to display

## 2022-03-09 NOTE — Plan of Care (Signed)
  Problem: Education: Goal: Knowledge of disease or condition will improve Outcome: Progressing Goal: Knowledge of the prescribed therapeutic regimen will improve Outcome: Progressing   Problem: Activity: Goal: Risk for activity intolerance will decrease Outcome: Progressing   Problem: Cardiac: Goal: Will achieve and/or maintain hemodynamic stability Outcome: Progressing   Problem: Clinical Measurements: Goal: Postoperative complications will be avoided or minimized Outcome: Progressing

## 2022-03-09 NOTE — TOC Progression Note (Addendum)
Transition of Care [facility]) - Progression Note    Patient Details  Name: Hannah Klein MRN: [id number] Date of Birth: 1976-02-19  Transition of Care [facility]) CM/SW Contact  Zenon Mayo, RN Phone Number: 03/09/2022, 8:49 AM  Clinical Narrative:    From home alone, s/p robotic assisted wedge resection,  bp  elevated, still with chest tube, for poss removal today, if so and cxr remains stable may poss dc today per MD note. TOC following.   Chest has been dc'd, patient states she does have transport home at dc.          Expected Discharge Plan and Services                                               Social Determinants of Health (SDOH) Interventions SDOH Screenings   Food Insecurity: No Food Insecurity (01/26/2022)  Housing: Low Risk  (01/26/2022)  Transportation Needs: No Transportation Needs (01/26/2022)  Utilities: Not At Risk (01/26/2022)  Depression (PHQ2-9): Low Risk  (06/09/2021)  Tobacco Use: Medium Risk (03/09/2022)    Readmission Risk Interventions     No data to display

## 2022-03-09 NOTE — Progress Notes (Signed)
Mobility Specialist Progress Note    03/09/22 0940  Mobility  Activity Ambulated independently in hallway  Level of Assistance Standby assist, set-up cues, supervision of patient - no hands on  Assistive Device None  Distance Ambulated (ft) 200 ft  Activity Response Tolerated fair  Mobility Referral Yes  $Mobility charge 1 Mobility   Pre-Mobility: 73 HR, 94% SpO2 Post-Mobility: 77 HR, 88% SpO2  Pt received in bed and agreeable. Pt SOB w/ exertion. Pt stated this was how she normally felt before the surgery. Returned to bed with call bell in reach.    Hildred Alamin Mobility Specialist  Please Psychologist, sport and exercise or Rehab Office at [phone]

## 2022-03-09 NOTE — Discharge Summary (Signed)
Physician Discharge Summary  Patient ID: Hannah Klein MRN: [id number] DOB/AGE: 1975/09/07 47 y.o.  Admit date: 03/08/2022 Discharge date: 03/12/2022  Admission Diagnoses:  Patient Active Problem List   Diagnosis Date Noted   ILD (interstitial lung disease) ([place]) 03/08/2022   Pneumonitis 01/26/2022   Perennial allergic rhinitis 09/21/2021   Neck pain 08/31/2021   Allergic rhinitis due to pollen 08/31/2021   Moderate persistent asthma, uncomplicated 62/13/0865   Vitamin D deficiency 06/09/2021   Acute recurrent frontal sinusitis 04/28/2021   BPV (benign positional vertigo), right 04/28/2021   Right elbow pain 04/28/2021   Right tennis elbow 04/28/2021   Upper airway cough syndrome 09/28/2020   Cough 08/26/2020   Pulmonary nodule 04/29/2020   Dyspnea 04/04/2020   Chronic rhinitis 04/04/2020   OSA (obstructive sleep apnea) 04/04/2020   Essential hypertension 11/11/2017   Hyperlipidemia 11/29/2016   Encounter for annual general medical examination with abnormal findings in adult 11/23/2015   Constipation 11/23/2015   Generalized anxiety disorder 04/26/2015   Right-sided low back pain with right-sided sciatica 04/26/2015   Asthma 04/26/2015   Frequent headaches 04/26/2015   Osteoarthritis 04/26/2015   GERD (gastroesophageal reflux disease) 04/26/2015   Discharge Diagnoses:   Patient Active Problem List   Diagnosis Date Noted   ILD (interstitial lung disease) ([place]) 03/08/2022   S/P robot-assisted surgical procedure 03/08/2022   Pneumonitis 01/26/2022   Perennial allergic rhinitis 09/21/2021   Neck pain 08/31/2021   Allergic rhinitis due to pollen 08/31/2021   Moderate persistent asthma, uncomplicated 78/46/9629   Vitamin D deficiency 06/09/2021   Acute recurrent frontal sinusitis 04/28/2021   BPV (benign positional vertigo), right 04/28/2021   Right elbow pain 04/28/2021   Right tennis elbow 04/28/2021   Upper airway cough syndrome 09/28/2020   Cough 08/26/2020    Pulmonary nodule 04/29/2020   Dyspnea 04/04/2020   Chronic rhinitis 04/04/2020   OSA (obstructive sleep apnea) 04/04/2020   Essential hypertension 11/11/2017   Hyperlipidemia 11/29/2016   Encounter for annual general medical examination with abnormal findings in adult 11/23/2015   Constipation 11/23/2015   Generalized anxiety disorder 04/26/2015   Right-sided low back pain with right-sided sciatica 04/26/2015   Asthma 04/26/2015   Frequent headaches 04/26/2015   Osteoarthritis 04/26/2015   GERD (gastroesophageal reflux disease) 04/26/2015   Discharged Condition: good  History of Present Illness:    Hannah Klein 47 y.o. female presents for surgical evaluation and interstitial lung process.  She states that over the past 4 to 5 months she has had progressive shortness of breath, and can now barely walk short distances without having to stop.  She does have a long history of asthma, but her symptoms have progressed significantly over the last year.   She also has a history of gastroesophageal reflux, and a small hiatal hernia.  Hospital Course:  Hannah Klein arrived at [facility] and was brought to the operating room on 03/08/22. She underwent robotic assisted wedge resection of the right upper lobe, right middle lobe and right lower lobe. She tolerated the procedure well and was transferred to the PACU in stable condition.  The patient's chest tube was free from air leak.  She had no evidence of pneumothorax on her CXR.  Her chest output was low.  Her chest tube was removed on 03/09/2021.  Follow up CXR showed development of pneumothorax that was small about 10-15% and subcutaneous emphysema along her chest and into her right neck.  She was observed clinically with repeat CXR showing the  findings to be quite stable.  She maintained good oxygen saturations on room air.  Incisions were healing well without evidence of infection.  She was tolerating diet and routine activities..   Renal function remained within normal limits.  She had a very minor expected acute blood loss anemia which was stable.  Overall, at the time of discharge the patient was felt to be quite stable.  Consults: None  Significant Diagnostic Studies: CT Scan  FINDINGS: Cardiovascular: Heart size is mildly enlarged. There is no significant pericardial fluid, thickening or pericardial calcification. No atherosclerotic calcifications are noted in the thoracic aorta or the coronary arteries.   Mediastinum/Nodes: No pathologically enlarged mediastinal or hilar lymph nodes. Several densely calcified left hilar and mediastinal lymph nodes are incidentally noted. Esophagus is unremarkable in appearance. No axillary lymphadenopathy.   Lungs/Pleura: High-resolution imaging was not performed. With these limitations in mind, there are widespread but patchy areas of ground-glass attenuation, septal thickening, thickening of the peribronchovascular interstitium, subpleural reticulation, and developing mild cylindrical traction bronchiectasis are noted throughout the lungs bilaterally. Findings have a definitive craniocaudal gradient. Findings are mildly progressive compared to the most recent prior study, but have clearly progressed compared to more remote prior examination 04/07/2021. No frank honeycombing. No acute consolidative airspace disease. No pleural effusions. No definite suspicious appearing pulmonary nodules or masses are noted.   Upper Abdomen: Numerous calcified granulomas are noted throughout the spleen. Status post cholecystectomy.   Musculoskeletal: There are no aggressive appearing lytic or blastic lesions noted in the visualized portions of the skeleton.   IMPRESSION: 1. The appearance of the lungs is compatible with progressive interstitial lung disease, with a spectrum of findings considered probable usual interstitial pneumonia (UIP) per current ATS guidelines. Repeat  high-resolution chest CT is suggested in 12 months to assess for temporal changes in the appearance of the lung parenchyma. 2. Mild cardiomegaly. 3. Old granulomatous disease, as above.     Electronically Signed   By: Vinnie Langton M.D.   On: 02/22/2022 09:30   Treatments: surgery:     PATHOLOGY:  Discharge Exam: Blood pressure (!) 144/83, pulse 75, temperature 98.4 F (36.9 C), temperature source Oral, resp. rate (!) 27, height 5\' 9"  (1.753 m), weight 93.9 kg, last menstrual period 01/18/2022, SpO2 96 %.   General appearance: alert, cooperative, and no distress Heart: regular rate and rhythm Lungs: clear to auscultation bilaterally Abdomen: Benign Extremities: Warm well-perfused Wound: Incisions healing well Disposition: Discharge disposition: 01-Home or Self Care       Discharge Instructions     Discharge patient   Complete by: As directed    Discharge disposition: 01-Home or Self Care   Discharge patient date: 03/10/2022      Allergies as of 03/10/2022       Reactions   Advair Diskus [fluticasone-salmeterol] Other (See Comments)   "dries my throat out really bad'   Flonase [fluticasone]    Nose bleeds   Lipitor [atorvastatin Calcium] Other (See Comments)   Elevations in blood pressure?        Medication List     TAKE these medications    acetaminophen 500 MG tablet Commonly known as: TYLENOL Take 2 tablets (1,000 mg total) by mouth every 6 (six) hours as needed.   albuterol 108 (90 Base) MCG/ACT inhaler Commonly known as: VENTOLIN HFA INHALE 1-2 PUFFS BY MOUTH EVERY 6 HOURS AS NEEDED FOR WHEEZE OR SHORTNESS OF BREATH   ascorbic acid 500 MG tablet Commonly known as: VITAMIN C Take 500  mg by mouth daily.   clobetasol ointment 0.05 % Commonly known as: TEMOVATE Apply 1 Application topically 2 (two) times daily.   cyanocobalamin 1000 MCG tablet Commonly known as: VITAMIN B12 Take 1,000 mcg by mouth daily.   esomeprazole 40 MG  capsule Commonly known as: NEXIUM TAKE 1 CAPSULE (40 MG TOTAL) BY MOUTH 2 (TWO) TIMES DAILY. FOR HEARTBURN.   famotidine 40 MG tablet Commonly known as: PEPCID TAKE 1 TABLET BY MOUTH TWICE A DAY   formoterol 20 MCG/2ML nebulizer solution Commonly known as: PERFOROMIST Take 2 mLs (20 mcg total) by nebulization 2 (two) times daily. What changed:  when to take this reasons to take this   hydrOXYzine 50 MG capsule Commonly known as: VISTARIL TAKE 1 CAPSULE BY MOUTH ONCE OR TWICE DAILY AS NEEDED FOR ANXIETY What changed: See the new instructions.   levocetirizine 5 MG tablet Commonly known as: XYZAL Take 1 tablet (5 mg total) by mouth in the morning and at bedtime.   mometasone-formoterol 100-5 MCG/ACT Aero Commonly known as: DULERA Inhale 2 puffs into the lungs 2 (two) times daily.   multivitamin capsule Take 1 capsule by mouth daily.   rosuvastatin 10 MG tablet Commonly known as: Crestor Take 1 tablet (10 mg total) by mouth daily. for cholesterol.   sucralfate 1 g tablet Commonly known as: Carafate Take 1 tablet (1 g total) by mouth in the morning, at noon, and at bedtime. What changed: when to take this   traMADol 50 MG tablet Commonly known as: ULTRAM Take 1 tablet (50 mg total) by mouth every 6 (six) hours as needed (mild pain).   Vitamin D3 125 MCG (5000 UT) Tabs Take 5,000 Units by mouth daily.   vitamin E 180 MG (400 UNITS) capsule Take 400 Units by mouth daily.   Voltaren Arthritis Pain 1 % Gel Generic drug: diclofenac Sodium Apply 2 g topically daily as needed (Chest pain).        Follow-up Information     Corliss Skains, MD Follow up on 03/23/2022.   Specialty: Cardiothoracic Surgery Why: Follow up appointment is at 12:50PM Contact information: [address] [place] [address] [phone]                 Signed: Rowe Clack, PA-C  03/12/2022, 9:00 AM

## 2022-03-10 ENCOUNTER — Inpatient Hospital Stay (HOSPITAL_COMMUNITY): Payer: Commercial Managed Care - HMO

## 2022-03-10 LAB — COMPREHENSIVE METABOLIC PANEL
ALT: 15 U/L (ref 0–44)
AST: 25 U/L (ref 15–41)
Albumin: 3.2 g/dL — ABNORMAL LOW (ref 3.5–5.0)
Alkaline Phosphatase: 42 U/L (ref 38–126)
Anion gap: 10 (ref 5–15)
BUN: 14 mg/dL (ref 6–20)
CO2: 24 mmol/L (ref 22–32)
Calcium: 8.3 mg/dL — ABNORMAL LOW (ref 8.9–10.3)
Chloride: 103 mmol/L (ref 98–111)
Creatinine, Ser: 0.77 mg/dL (ref 0.44–1.00)
GFR, Estimated: >60 mL/min (ref >60)
Glucose, Bld: 109 mg/dL — ABNORMAL HIGH (ref 70–99)
Potassium: 3.8 mmol/L (ref 3.5–5.1)
Sodium: 137 mmol/L (ref 135–145)
Total Bilirubin: 0.7 mg/dL (ref 0.3–1.2)
Total Protein: 6.4 g/dL — ABNORMAL LOW (ref 6.5–8.1)

## 2022-03-10 LAB — CBC
HCT: 36.3 % (ref 36.0–46.0)
Hemoglobin: 11.9 g/dL — ABNORMAL LOW (ref 12.0–15.0)
MCH: 29.1 pg (ref 26.0–34.0)
MCHC: 32.8 g/dL (ref 30.0–36.0)
MCV: 88.8 fL (ref 80.0–100.0)
Platelets: 207 10*3/uL (ref 150–400)
RBC: 4.09 MIL/uL (ref 3.87–5.11)
RDW: 13.2 % (ref 11.5–15.5)
WBC: 9.4 10*3/uL (ref 4.0–10.5)
nRBC: 0 % (ref 0.0–0.2)

## 2022-03-10 MED ORDER — TRAMADOL HCL 50 MG PO TABS: 50.0000 mg | ORAL_TABLET | Freq: Four times a day (QID) | ORAL | 0 refills | Status: DC | PRN

## 2022-03-10 MED ORDER — ACETAMINOPHEN 500 MG PO TABS: 1000.0000 mg | ORAL_TABLET | Freq: Four times a day (QID) | ORAL | 0 refills | Status: AC | PRN

## 2022-03-10 NOTE — Progress Notes (Signed)
[address]       [place] [address]             [phone]     2 Days Post-Op Procedure(s) (LRB): RIGHT XI ROBOTIC ASSISTED THORACOSCOPY-WEDGE RESECTION (Right) INTERCOSTAL NERVE BLOCK (Right) Subjective: Only concern is subcu air.  It is not significantly increased per patient perspective since yesterday.  Minor discomfort.  Not affecting breathing.  Objective: Vital signs in last 24 hours: Temp:  [98.1 F (36.7 C)-98.9 F (37.2 C)] 98.2 F (36.8 C) (01/13 0737) Pulse Rate:  [63-75] 75 (01/13 0737) Cardiac Rhythm: Normal sinus rhythm (01/13 0800) Resp:  [16-25] 25 (01/13 0737) BP: (114-152)/(72-80) 133/80 (01/13 0737) SpO2:  [92 %-98 %] 96 % (01/13 0737)  Hemodynamic parameters for last 24 hours:    Intake/Output from previous day: 01/12 0701 - 01/13 0700 In: 478 [P.O.:478] Out: 11 [Urine:1; Chest Tube:10] Intake/Output this shift: No intake/output data recorded.  General appearance: alert, cooperative, and no distress Heart: regular rate and rhythm Lungs: clear to auscultation bilaterally Abdomen: Benign Extremities: Warm well-perfused Wound: Incisions healing well  Lab Results: Recent Labs    03/09/22 0025 03/10/22 0011  WBC 12.1* 9.4  HGB 12.7 11.9*  HCT 37.5 36.3  PLT 239 207   BMET:  Recent Labs    03/09/22 0025 03/10/22 0011  NA 135 137  K 4.0 3.8  CL 102 103  CO2 23 24  GLUCOSE 126* 109*  BUN 9 14  CREATININE 0.84 0.77  CALCIUM 8.9 8.3*    PT/INR: No results for input(s): "LABPROT", "INR" in the last 72 hours. ABG No results found for: "PHART", "HCO3", "TCO2", "ACIDBASEDEF", "O2SAT" CBG (last 3)  No results for input(s): "GLUCAP" in the last 72 hours.  Meds Scheduled Meds:  acetaminophen  1,000 mg Oral Q6H   Or   acetaminophen (TYLENOL) oral liquid 160 mg/5 mL  1,000 mg Oral Q6H   bisacodyl  10 mg Oral Daily   enoxaparin (LOVENOX) injection  40 mg Subcutaneous Daily   hydrOXYzine  50 mg Oral BID    ipratropium-albuterol  3 mL Nebulization Q6H   ketorolac  15 mg Intravenous Q6H   loratadine  10 mg Oral Daily   lung surgery book   Does not apply Once   pantoprazole  40 mg Oral Daily   rosuvastatin  10 mg Oral Daily   senna-docusate  1 tablet Oral QHS   Continuous Infusions: PRN Meds:.morphine injection, ondansetron (ZOFRAN) IV, traMADol  Xrays DG Chest 2 View  Result Date: 03/10/2022 CLINICAL DATA:  Follow up pneumothorax EXAM: CHEST - 2 VIEW COMPARISON:  03/09/2022 FINDINGS: Extensive subcutaneous emphysema overlies the right hemithorax and neck. Alveolar and interstitial opacities consistent with pulmonary edema and possible bibasilar pneumonia. No pneumothorax is seen currently. No definite pleural effusions. Are unremarkable. IMPRESSION: Subcutaneous emphysema. Interstitial and alveolar opacities consistent with edema and possible pneumonia. No pneumothorax identified Electronically Signed   By: Sammie Bench M.D.   On: 03/10/2022 08:46   DG Chest 1V REPEAT Same Day  Result Date: 03/09/2022 CLINICAL DATA:  Chest tube removal EXAM: CHEST - 1 VIEW SAME DAY COMPARISON:  Same day chest radiograph FINDINGS: Interval removal of right basilar chest tube with increased small right pneumothorax (estimated 10-15%). New left apical pneumothorax (estimated 10%). Stable heart size. Progressive patchy bilateral airspace opacities. Subcutaneous emphysema of the right chest wall and right neck. IMPRESSION: 1. Interval removal of right basilar chest tube with increased small right pneumothorax.  2. New left apical pneumothorax (estimated 10%). These results will be called to the ordering clinician or representative by the Radiologist Assistant, and communication documented in the PACS or [employer]. Electronically Signed   By: Davina Poke D.O.   On: 03/09/2022 12:14   DG Chest Port 1 View  Result Date: 03/09/2022 CLINICAL DATA:  Pneumothorax. EXAM: PORTABLE CHEST 1 VIEW COMPARISON:  Chest  XR, 03/08/2022. FINDINGS: Support lines: RIGHT, large bore and basilar-directed thoracostomy tube, unchanged in positioning however with distal sentinel eye (side port) projecting at the rib interface. Cardiomediastinal silhouette is within normal limits given technique and degree of inflation. Unchanged pulmonary findings with relative hypoinflation and LEFT basilar/retrocardiac opacity. No discrete pneumothorax or large volume pleural effusion. Subcutaneous emphysema. ACDF hardware, incompletely imaged. No interval osseous abnormality. IMPRESSION: 1. Large bore RIGHT thoracostomy tube with distal sentinel eye/side port at the rib interface. This is confirmed by overlying subcutaneous emphysema. 2. No discrete pneumothorax or large volume pleural effusion. Electronically Signed   By: Michaelle Birks M.D.   On: 03/09/2022 07:52   DG Chest Port 1 View  Result Date: 03/08/2022 CLINICAL DATA:  [id number] Pneumothorax [id number] EXAM: PORTABLE CHEST - 1 VIEW COMPARISON:  03/07/2022 FINDINGS: Cardiac silhouette is prominent. There is pulmonary interstitial prominence with vascular congestion. No focal consolidation. No pneumothorax or pleural effusion identified. Right medial basilar chest tube in place. IMPRESSION: Findings suggest CHF. Electronically Signed   By: Sammie Bench M.D.   On: 03/08/2022 15:55    Assessment/Plan: S/P Procedure(s) (LRB): RIGHT XI ROBOTIC ASSISTED THORACOSCOPY-WEDGE RESECTION (Right) INTERCOSTAL NERVE BLOCK (Right) POD #2  1 afebrile, SBP 114-152 range, sinus rhythm 2 oxygen saturations are good on room air 3 chest tube minimal drainage 4 x-ray-subcutaneous air looks pretty similar to yesterday's film. 5 normal renal function 6 low albumin 3.2, push nutrition as able LFTs normal 7 leukocytosis resolved 8 Very minor anemia, expected acute blood loss 9 stable for discharge, did discuss things to look for that would require return to the hospital     LOS: 2 days    John Giovanni  [facility] Pager [phone] 03/10/2022

## 2022-03-12 ENCOUNTER — Telehealth: Payer: Self-pay | Admitting: Allergy & Immunology

## 2022-03-12 NOTE — Telephone Encounter (Signed)
Called [facility] - spoke to [employer]. - DOB verified - stated denial for CT Chest Scan w/o contrast was due to not receiving any office notes, lab results regarding need for CT Scan.  Faxed documentation to [phone] Attn: Reconsideration Dept. Ref/Case # [id number].

## 2022-03-12 NOTE — Telephone Encounter (Signed)
Representative Hilda Blades C) for Hannah Klein states request for CT has been denied. You can call them back at [phone]. Use Ref # [id number]

## 2022-03-22 NOTE — Telephone Encounter (Signed)
Called [phone],  for status of CT Chest scan - spoke to Hannah Klein. - DOB verified - after review Hannah Klein advised patient has already had CT Chest Scan - 02/21/22 - disregard peer to peer review.

## 2022-03-22 NOTE — Progress Notes (Signed)
[address]       ,[place] [address]             [place] Record #[id number] Date of Birth: Nov 01, 1975  Referring: Pleas Koch, NP Primary Care: Pleas Koch, NP Primary Cardiologist:None  Reason for visit:   follow-up  History of Present Illness:     47yo female present for her 1st follow-up after undergoing wedge resection.  She remains quite short of breath.  She states that her oxygen levels dropped into the low 80s at night.  Physical Exam: BP 138/86 (BP Location: Left Arm, Patient Position: Sitting)   Pulse 82   Resp 20   Ht 5\' 9"  (1.753 m)   Wt 213 lb (96.6 kg)   LMP 03/01/2022 (Within Days)   SpO2 92% Comment: RA  BMI 31.45 kg/m   Alert NAD Incision clean.  Abdomen, ND No peripheral edema   Diagnostic Studies & Laboratory data:  Path: Pending    Assessment / Plan:   47yo female s/p wedge resection for diagnosis of ILD.  Pathology is still pending.  I am concerned about her worsening dyspnea.  Will reach out to her pulmonologist to see if she needs supplemental oxygen.   [name] 03/23/2022 2:43 PM

## 2022-03-23 ENCOUNTER — Other Ambulatory Visit: Payer: Self-pay | Admitting: Primary Care

## 2022-03-23 ENCOUNTER — Ambulatory Visit (INDEPENDENT_AMBULATORY_CARE_PROVIDER_SITE_OTHER): Payer: Self-pay | Admitting: Thoracic Surgery (Cardiothoracic Vascular Surgery)

## 2022-03-23 ENCOUNTER — Other Ambulatory Visit: Payer: Self-pay | Admitting: Allergy & Immunology

## 2022-03-23 VITALS — BP 138/86 | HR 82 | Resp 20 | Ht 69.0 in | Wt 213.0 lb

## 2022-03-23 DIAGNOSIS — Z9889 Other specified postprocedural states: Secondary | ICD-10-CM

## 2022-03-23 DIAGNOSIS — K219 Gastro-esophageal reflux disease without esophagitis: Secondary | ICD-10-CM

## 2022-03-23 DIAGNOSIS — J849 Interstitial pulmonary disease, unspecified: Secondary | ICD-10-CM

## 2022-03-23 LAB — SURGICAL PATHOLOGY

## 2022-03-23 NOTE — Telephone Encounter (Signed)
Received refill request for Nexium from pharmacy. She should have another three months on hand. Is she out?  Also needs CPE scheduled for April

## 2022-03-28 ENCOUNTER — Encounter: Payer: Self-pay | Admitting: Emergency Medicine

## 2022-03-28 NOTE — Telephone Encounter (Signed)
I agree with having her do a formal walking oximetry.  This may already be ordered, I think I sent a staff message earlier in the week to try to get this arranged.  If it has not been ordered then okay with getting it set up.  It sounds like she might also need an overnight oximetry on room air.

## 2022-03-28 NOTE — Telephone Encounter (Signed)
Dr. Lamonte Sakai, please see mychart messages and advise. Please advise if you think we should get pt to come in to the office for an appt and walk test.

## 2022-03-29 ENCOUNTER — Encounter: Payer: Self-pay | Admitting: Primary Care

## 2022-03-29 ENCOUNTER — Other Ambulatory Visit: Payer: Self-pay | Admitting: Primary Care

## 2022-03-29 ENCOUNTER — Ambulatory Visit (INDEPENDENT_AMBULATORY_CARE_PROVIDER_SITE_OTHER): Payer: Commercial Managed Care - HMO | Admitting: Primary Care

## 2022-03-29 VITALS — BP 130/80 | HR 86 | Temp 97.9°F | Ht 69.0 in | Wt 212.6 lb

## 2022-03-29 DIAGNOSIS — R058 Other specified cough: Secondary | ICD-10-CM | POA: Diagnosis not present

## 2022-03-29 DIAGNOSIS — J849 Interstitial pulmonary disease, unspecified: Secondary | ICD-10-CM | POA: Diagnosis not present

## 2022-03-29 DIAGNOSIS — K219 Gastro-esophageal reflux disease without esophagitis: Secondary | ICD-10-CM

## 2022-03-29 DIAGNOSIS — R0602 Shortness of breath: Secondary | ICD-10-CM | POA: Diagnosis not present

## 2022-03-29 DIAGNOSIS — J4540 Moderate persistent asthma, uncomplicated: Secondary | ICD-10-CM

## 2022-03-29 MED ORDER — AZELASTINE HCL 0.1 % NA SOLN: 2.0000 | Freq: Two times a day (BID) | NASAL | 1 refills | Status: DC

## 2022-03-29 MED ORDER — BUDESONIDE 0.25 MG/2ML IN SUSP: 0.2500 mg | Freq: Every day | RESPIRATORY_TRACT | 1 refills | Status: DC

## 2022-03-29 NOTE — Patient Instructions (Addendum)
Recommendations: - Start Budesonide (Pulmicort) nebulizer twice daily (new) - Use Fomoterol (Performist) nebulizer twice daily ONLY if you can fill the new nebulizer  - Start Astelin nasal spray twice daily (RX sent) - Start DELSYM cough syrup twice daily (over the counter) - Use incentive spirometer 4 TIMES day   Orders: - Check overnight oximetry test   Follow-up: - February with Dr. Lamonte Sakai

## 2022-03-29 NOTE — Telephone Encounter (Signed)
Please call patient: Received refill request for Nexium.  I sent a 76-month supply of Nexium in July 2023 so she should have another 28-month supply on hand at the pharmacy.  She will also need to be scheduled for her physical/follow-up in April.

## 2022-03-29 NOTE — Telephone Encounter (Signed)
Called and spoke with pharmacy they stated patients insurance will no longer cover Nexium and it did not give the pharmacy an option to fax over a PA request.    Sent patient a MyChart message updating her.

## 2022-03-29 NOTE — Telephone Encounter (Signed)
Please start a PA for Nexium for this patient. Thanks

## 2022-03-29 NOTE — Progress Notes (Signed)
@Patient  ID: Hannah Klein, female    DOB: May 05, 1975, 47 y.o.   MRN: [id number]  Chief Complaint  Patient presents with   Follow-up    Patient is trying to see if she might qualify for oxygen.    Referring provider: Pleas Koch, NP  HPI: 47 year old female,   Previous [facility] pulmonary encounter: ROV 01/13/21 --47 year old woman who follows up today for history of allergic asthma, allergic rhinitis.  Former tobacco smoker.  She has mixed obstruction and restriction on pulmonary function testing.  She also has newly diagnosed obstructive sleep apnea.  It took a long time for [name] to get her CPAP because supplies were on backorder but she does now have this.  She reports that she is been wearing it well. She is dealing with dryness - she does use humidity add has turned it up. She isn't really sure whether it has made her feel better during the day yet.  Reports that she is having some chest aching, no wheeze or cough She is on Symbicort. Uses albuterol about once a day.   CPAP Compliance report today shows 80% usage, 50% for greater than 4 hours on an AutoSet between 10 and 20 cm water.   ROV 02/28/22 --follow-up visit 47 year old woman, former smoker, with a history of allergic asthma, allergic rhinitis.  Her IgE and a season of cannabinoids been reassuring.  She is followed for this as well as obstructive sleep apnea for which she is on CPAP.  Pulmonary function testing with mixed obstruction and restriction.  Since last time we had to change her Symbicort to [facility] for insurance reasons.  She was started on Tezspire in mid 2023.  I saw her when she was admitted to the hospital with bilateral groundglass pulmonary infiltrates suggestive of a pneumonitis.  Her COVID-19, flu panel, full viral panel was all negative.  She had a positive rheumatoid factor, CRP. Her ACE level is negative.  Treated her with prednisone to complete a 3-week course, current dose is 0.  Small hiatal hernia and GERD  but no overt aspiration symptoms.  She is not currently on bronchodilator therapy.  Symbicort no longer covered.  She does not tolerate powdered inhalers. She finished prednisone end of December.  She is having persistent exertional SOB, persistent paroxysms of cough - really no change from the hospitalization, possibly a bit worse.   Chest 02/21/2022 reviewed by me, shows persistent widespread patchy areas of groundglass attenuation and peribronchovascular thickening, base predominant,  little change to question progressed compared with her CT 01/25/2022   03/29/2022- Interim hx  Patient presents today for acute OV d/t shortness of breath and hypoxia.   S/p wedge resection for diagnosis of ILD with Dr. Kipp Brood on 03/08/22. 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 Since procedure she remains quite short of breath, noticing oxygen level dropping into low 80s at home. CXR on 03/09/22 showed subcutaneous emphysema. Interstitial and alveolar opacities consistent with edema and possible pneumonia. No pneumothorax identified. She comes to the office today for walk test to see if qualifies for supplemental oxygen at home. Patient walked 3 laps in office today, lowest O2 dropped 91-92% RA. No inhalers have helps her breathing. She is not on Symbicort ort Dulera. She could not tolerate Breztri. She is not using perforomist regularly d/t cost. She has a persistent dry cough along with post nasal drip.     Allergies  Allergen Reactions   Advair Diskus [Fluticasone-Salmeterol] Other (See Comments)    "  dries my throat out really bad'   Flonase [Fluticasone]     Nose bleeds   Lipitor [Atorvastatin Calcium] Other (See Comments)    Elevations in blood pressure?    Immunization History  Administered Date(s) Administered   Influenza-Unspecified 05/26/2017   Tdap 01/09/2018    Past Medical History:  Diagnosis Date   Arthritis    Asthma    Environmental  allergies    Esophageal dysphagia    Generalized anxiety disorder    GERD (gastroesophageal reflux disease)    History of hiatal hernia    Hx of colonic polyps    Hypertension    Migraines    MRSA (methicillin resistant Staphylococcus aureus)    Lt upper arm  approx 8 years ago   Sleep apnea     Tobacco History: Social History   Tobacco Use  Smoking Status Former   Packs/day: 2.00   Years: 18.00   Total pack years: 36.00   Types: Cigarettes   Start date: 35   Quit date: 2009   Years since quitting: 15.0   Passive exposure: Current  Smokeless Tobacco Never   Counseling given: Not Answered   Outpatient Medications Prior to Visit  Medication Sig Dispense Refill   acetaminophen (TYLENOL) 500 MG tablet Take 2 tablets (1,000 mg total) by mouth every 6 (six) hours as needed. 30 tablet 0   albuterol (VENTOLIN HFA) 108 (90 Base) MCG/ACT inhaler INHALE 1-2 PUFFS BY MOUTH EVERY 6 HOURS AS NEEDED FOR WHEEZE OR SHORTNESS OF BREATH 8.5 each 1   Cholecalciferol (VITAMIN D3) 125 MCG (5000 UT) TABS Take 5,000 Units by mouth daily.     clobetasol ointment (TEMOVATE) 1.61 % Apply 1 Application topically 2 (two) times daily. 30 g 0   diclofenac Sodium (VOLTAREN ARTHRITIS PAIN) 1 % GEL Apply 2 g topically daily as needed (Chest pain).     esomeprazole (NEXIUM) 40 MG capsule TAKE 1 CAPSULE (40 MG TOTAL) BY MOUTH 2 (TWO) TIMES DAILY. FOR HEARTBURN. 180 capsule 2   famotidine (PEPCID) 40 MG tablet TAKE 1 TABLET BY MOUTH TWICE A DAY 60 tablet 5   hydrOXYzine (VISTARIL) 50 MG capsule TAKE 1 CAPSULE BY MOUTH ONCE OR TWICE DAILY AS NEEDED FOR ANXIETY (Patient taking differently: Take 50 mg by mouth 2 (two) times daily.) 180 capsule 2   levocetirizine (XYZAL) 5 MG tablet Take 1 tablet (5 mg total) by mouth in the morning and at bedtime. 180 tablet 1   Multiple Vitamin (MULTIVITAMIN) capsule Take 1 capsule by mouth daily.     rosuvastatin (CRESTOR) 10 MG tablet Take 1 tablet (10 mg total) by mouth  daily. for cholesterol. 90 tablet 3   sucralfate (CARAFATE) 1 g tablet Take 1 tablet (1 g total) by mouth in the morning, at noon, and at bedtime. (Patient taking differently: Take 1 g by mouth 2 (two) times daily.) 90 tablet 5   traMADol (ULTRAM) 50 MG tablet Take 1 tablet (50 mg total) by mouth every 6 (six) hours as needed (mild pain). 28 tablet 0   vitamin B-12 (CYANOCOBALAMIN) 1000 MCG tablet Take 1,000 mcg by mouth daily.     vitamin C (ASCORBIC ACID) 500 MG tablet Take 500 mg by mouth daily.     vitamin E 180 MG (400 UNITS) capsule Take 400 Units by mouth daily.     mometasone-formoterol (DULERA) 100-5 MCG/ACT AERO Inhale 2 puffs into the lungs 2 (two) times daily. 13 g 5   formoterol (PERFOROMIST) 20 MCG/2ML nebulizer solution Take  2 mLs (20 mcg total) by nebulization 2 (two) times daily. (Patient taking differently: Take 20 mcg by nebulization daily as needed (Shortness of breath).) 120 mL 1   No facility-administered medications prior to visit.   Review of Systems  Review of Systems  Constitutional: Negative.   HENT: Negative.    Respiratory:  Positive for cough and shortness of breath. Negative for chest tightness and wheezing.     Physical Exam  BP 130/80 (BP Location: Left Arm, Patient Position: Sitting, Cuff Size: Normal)   Pulse 86   Temp 97.9 F (36.6 C) (Oral)   Ht 5\' 9"  (1.753 m)   Wt 212 lb 9.6 oz (96.4 kg)   LMP 03/01/2022 (Within Days)   SpO2 95% Comment: RA  BMI 31.40 kg/m  Physical Exam Constitutional:      Appearance: Normal appearance.  HENT:     Head: Normocephalic and atraumatic.     Mouth/Throat:     Mouth: Mucous membranes are moist.     Pharynx: Oropharynx is clear.  Cardiovascular:     Rate and Rhythm: Normal rate and regular rhythm.  Pulmonary:     Effort: Pulmonary effort is normal.     Breath sounds: No wheezing or rhonchi.  Musculoskeletal:        General: Normal range of motion.  Skin:    General: Skin is warm and dry.  Neurological:      General: No focal deficit present.     Mental Status: She is alert and oriented to person, place, and time. Mental status is at baseline.  Psychiatric:        Mood and Affect: Mood normal.        Behavior: Behavior normal.        Thought Content: Thought content normal.        Judgment: Judgment normal.      Lab Results:  CBC    Component Value Date/Time   WBC 9.4 03/10/2022 0011   RBC 4.09 03/10/2022 0011   HGB 11.9 (L) 03/10/2022 0011   HGB 14.3 08/03/2021 1027   HCT 36.3 03/10/2022 0011   HCT 42.9 08/03/2021 1027   PLT 207 03/10/2022 0011   PLT 227 08/03/2021 1027   MCV 88.8 03/10/2022 0011   MCV 88 08/03/2021 1027   MCH 29.1 03/10/2022 0011   MCHC 32.8 03/10/2022 0011   RDW 13.2 03/10/2022 0011   RDW 12.8 08/03/2021 1027   LYMPHSABS 1.2 08/03/2021 1027   MONOABS 0.5 04/04/2020 1117   EOSABS 0.0 08/03/2021 1027   BASOSABS 0.0 08/03/2021 1027    BMET    Component Value Date/Time   NA 137 03/10/2022 0011   K 3.8 03/10/2022 0011   CL 103 03/10/2022 0011   CO2 24 03/10/2022 0011   GLUCOSE 109 (H) 03/10/2022 0011   BUN 14 03/10/2022 0011   CREATININE 0.77 03/10/2022 0011   CALCIUM 8.3 (L) 03/10/2022 0011   GFRNONAA >60 03/10/2022 0011   GFRAA >60 02/20/2018 1511    BNP    Component Value Date/Time   BNP 19.1 01/25/2022 1828    ProBNP No results found for: "PROBNP"  Imaging: DG Chest 2 View  Result Date: 03/10/2022 CLINICAL DATA:  Follow up pneumothorax EXAM: CHEST - 2 VIEW COMPARISON:  03/09/2022 FINDINGS: Extensive subcutaneous emphysema overlies the right hemithorax and neck. Alveolar and interstitial opacities consistent with pulmonary edema and possible bibasilar pneumonia. No pneumothorax is seen currently. No definite pleural effusions. Are unremarkable. IMPRESSION: Subcutaneous emphysema. Interstitial and alveolar  opacities consistent with edema and possible pneumonia. No pneumothorax identified Electronically Signed   By: Layla Maw M.D.    On: 03/10/2022 08:46   DG Chest 1V REPEAT Same Day  Result Date: 03/09/2022 CLINICAL DATA:  Chest tube removal EXAM: CHEST - 1 VIEW SAME DAY COMPARISON:  Same day chest radiograph FINDINGS: Interval removal of right basilar chest tube with increased small right pneumothorax (estimated 10-15%). New left apical pneumothorax (estimated 10%). Stable heart size. Progressive patchy bilateral airspace opacities. Subcutaneous emphysema of the right chest wall and right neck. IMPRESSION: 1. Interval removal of right basilar chest tube with increased small right pneumothorax. 2. New left apical pneumothorax (estimated 10%). These results will be called to the ordering clinician or representative by the Radiologist Assistant, and communication documented in the PACS or [employer]. Electronically Signed   By: Duanne Guess D.O.   On: 03/09/2022 12:14   DG Chest Port 1 View  Result Date: 03/09/2022 CLINICAL DATA:  Pneumothorax. EXAM: PORTABLE CHEST 1 VIEW COMPARISON:  Chest XR, 03/08/2022. FINDINGS: Support lines: RIGHT, large bore and basilar-directed thoracostomy tube, unchanged in positioning however with distal sentinel eye (side port) projecting at the rib interface. Cardiomediastinal silhouette is within normal limits given technique and degree of inflation. Unchanged pulmonary findings with relative hypoinflation and LEFT basilar/retrocardiac opacity. No discrete pneumothorax or large volume pleural effusion. Subcutaneous emphysema. ACDF hardware, incompletely imaged. No interval osseous abnormality. IMPRESSION: 1. Large bore RIGHT thoracostomy tube with distal sentinel eye/side port at the rib interface. This is confirmed by overlying subcutaneous emphysema. 2. No discrete pneumothorax or large volume pleural effusion. Electronically Signed   By: Roanna Banning M.D.   On: 03/09/2022 07:52   DG Chest Port 1 View  Result Date: 03/08/2022 CLINICAL DATA:  [id number] Pneumothorax [id number] EXAM: PORTABLE CHEST -  1 VIEW COMPARISON:  03/07/2022 FINDINGS: Cardiac silhouette is prominent. There is pulmonary interstitial prominence with vascular congestion. No focal consolidation. No pneumothorax or pleural effusion identified. Right medial basilar chest tube in place. IMPRESSION: Findings suggest CHF. Electronically Signed   By: Layla Maw M.D.   On: 03/08/2022 15:55   DG Chest 2 View  Result Date: 03/07/2022 CLINICAL DATA:  Preop evaluation for lung biopsy. EXAM: CHEST - 2 VIEW COMPARISON:  08/26/2020 FINDINGS: Lungs are hypoinflated demonstrate patchy hazy bilateral airspace opacification. No definite effusion. Borderline cardiomegaly. Remainder of the exam is unchanged. IMPRESSION: 1. Hypoinflation with patchy hazy bilateral airspace opacification. Findings may be due to an infectious or inflammatory process. 2. Borderline cardiomegaly. Electronically Signed   By: Elberta Fortis M.D.   On: 03/07/2022 16:38   Myocardial Perfusion Imaging  Result Date: 03/05/2022   The study is normal. The study is low risk.   No ST deviation was noted.   LV perfusion is normal. There is no evidence of ischemia. There is no evidence of infarction.   Left ventricular function is normal. Nuclear stress EF: 67 %. The left ventricular ejection fraction is hyperdynamic (>65%). End diastolic cavity size is normal. End systolic cavity size is normal.   Prior study not available for comparison. Fixed anteroseptal perfusion defect with normal wall motion, consistent with artifact Low risk study     Assessment & Plan:   Dyspnea - S/p wedge resection on 03/08/22 for diagnosis of ILD with Dr. Cliffton Asters. Since procedure she remains quite short of breath, noticing oxygen level dropping into low 80s at home. Patient was walked today in office and did not desaturate.  Patient walked 3  laps, lowest O2 dropped to 91 to 92%.  We will check an overnight oximetry test.   Asthma - Patient has severe obstructive airway disease on past pulmonary  function testing. She has been tried on Symbicort, [employer] and [employer]. Currently on Performist but not using daily d.t cost. Adding nebulized budesonide 0.25mg /2ML twice daily to LABA regimen.   Upper airway cough syndrome - Patient has dry cough with associated PND - Recommend patient start Astelin nasal spray twice daily along with Delsym cough syrup twice daily    Glenford Bayley, NP 03/30/2022

## 2022-03-30 MED ORDER — BUDESONIDE 0.25 MG/2ML IN SUSP: 0.2500 mg | Freq: Two times a day (BID) | RESPIRATORY_TRACT | 5 refills | Status: DC

## 2022-03-30 NOTE — Assessment & Plan Note (Signed)
-   S/p wedge resection on 03/08/22 for diagnosis of ILD with Dr. Kipp Brood. Since procedure she remains quite short of breath, noticing oxygen level dropping into low 80s at home. Patient was walked today in office and did not desaturate.  Patient walked 3 laps, lowest O2 dropped to 91 to 92%.  We will check an overnight oximetry test.

## 2022-03-30 NOTE — Assessment & Plan Note (Addendum)
-   Patient has severe obstructive airway disease on past pulmonary function testing. She has been tried on Symbicort, Dent[employer] and [employer]. Currently on Performist but not using daily d.t cost. Adding nebulized budesonide 0.25mg /2ML twice daily to LABA regimen.

## 2022-03-30 NOTE — Assessment & Plan Note (Signed)
-   Patient has dry cough with associated PND - Recommend patient start Astelin nasal spray twice daily along with Delsym cough syrup twice daily

## 2022-04-02 ENCOUNTER — Encounter: Payer: Self-pay | Admitting: Emergency Medicine

## 2022-04-03 ENCOUNTER — Telehealth: Payer: Self-pay | Admitting: Emergency Medicine

## 2022-04-03 ENCOUNTER — Other Ambulatory Visit: Payer: Self-pay | Admitting: Emergency Medicine

## 2022-04-03 DIAGNOSIS — J984 Other disorders of lung: Secondary | ICD-10-CM

## 2022-04-03 NOTE — Progress Notes (Signed)
Will work on setting up ILD clinic referral

## 2022-04-04 MED ORDER — PREDNISONE 10 MG PO TABS: ORAL_TABLET | ORAL | 0 refills | Status: AC

## 2022-04-04 NOTE — Telephone Encounter (Signed)
Please start her on Prednisone 30mg  daily for 3 weeks, then taper: 20mg  x 5 days, 10mg  x 5 days, then off.

## 2022-04-04 NOTE — Telephone Encounter (Signed)
I see where there is a HRCT order in pt's chart from Dr. Ernst Bowler but I do not see where there has been one placed from our office.  Routing to [facility] for assistance with this.

## 2022-04-04 NOTE — Telephone Encounter (Signed)
New message sent by pt: [name] "Hannah Klein"  P [facility] (supporting Collene Gobble, MD)18 hours ago (3:58 PM)    Thank you for your response.  Im anxious to get in to the clinic and see what can be done to help me.   I'm willing to try prednisone again.  I'm not sure if it helped or not.  Been thru alot since I took that.     Thank you Hannah Klein      Dr. Lamonte Sakai, please advise on this.

## 2022-04-05 IMAGING — DX DG ELBOW COMPLETE 3+V*R*
5 series · 5 of 5 positions shown · non-contrast
Comparison: None.

CLINICAL DATA: Right elbow pain, progressively worsening the last
week. Patient reports pain stems from the bicipital groove to the
lateral humeral condyle.

EXAM:
RIGHT ELBOW - COMPLETE 3+ VIEW

[elbow ap]
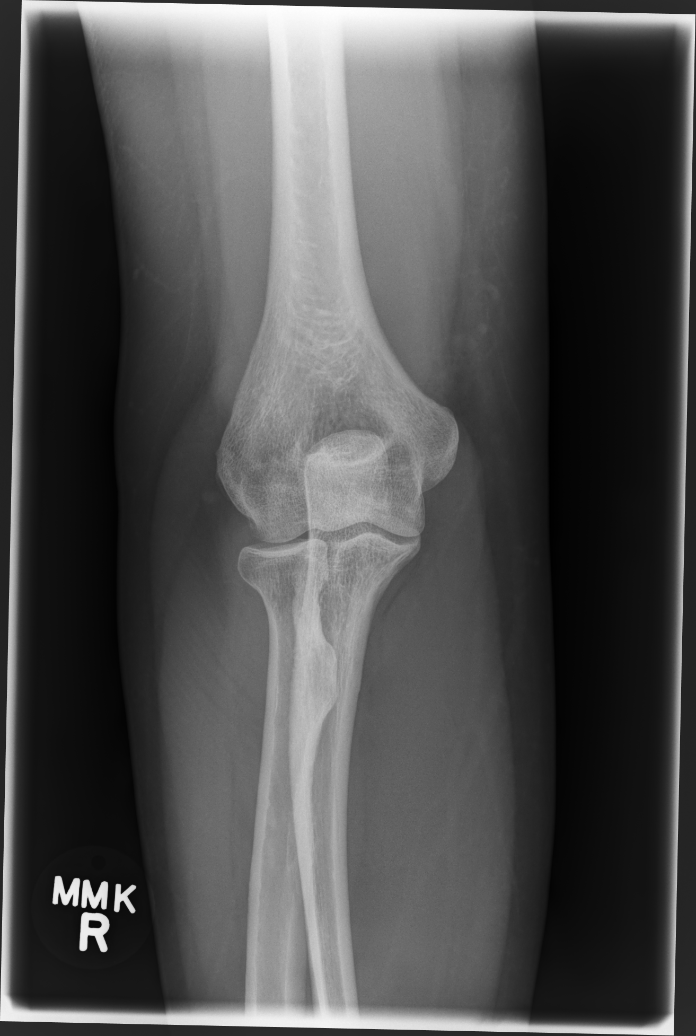

[elbow lmo (1 of 2)]
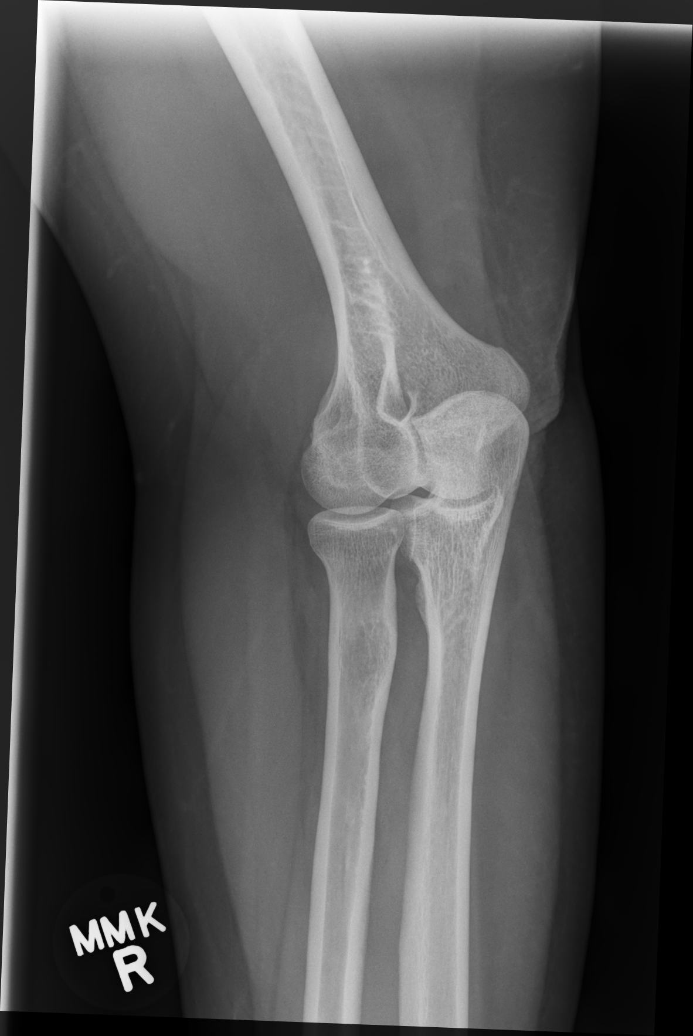

[elbow lmo (2 of 2)]
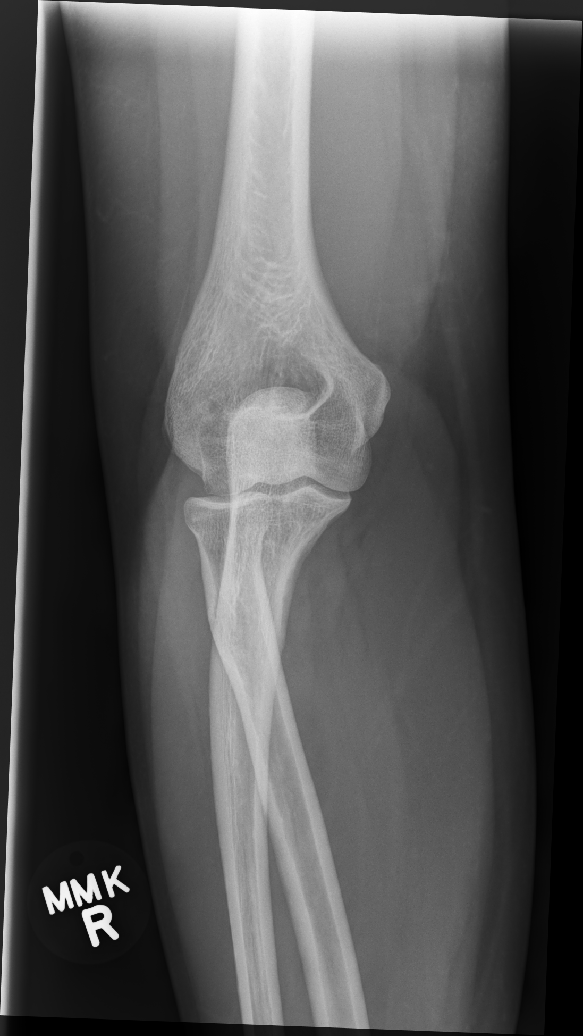

[elbow lat (1 of 2)]
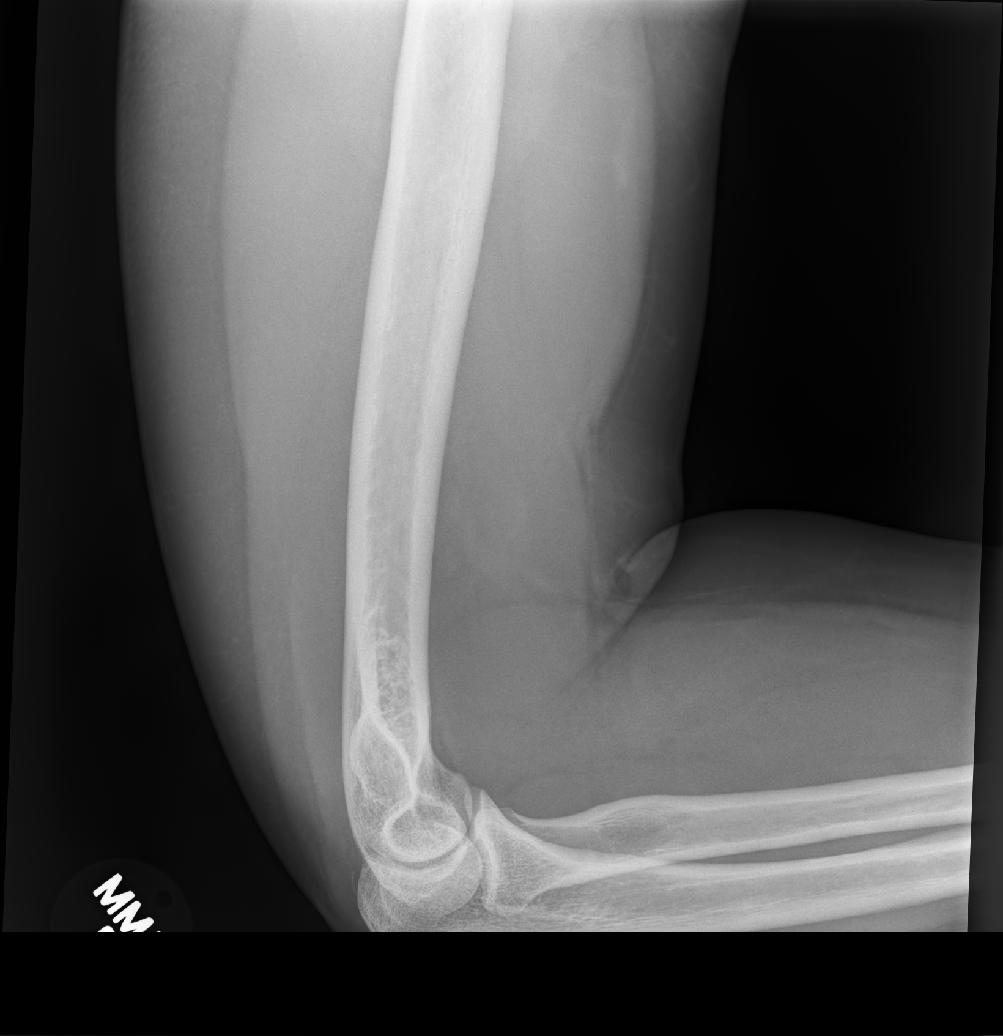

[elbow lat (2 of 2)]
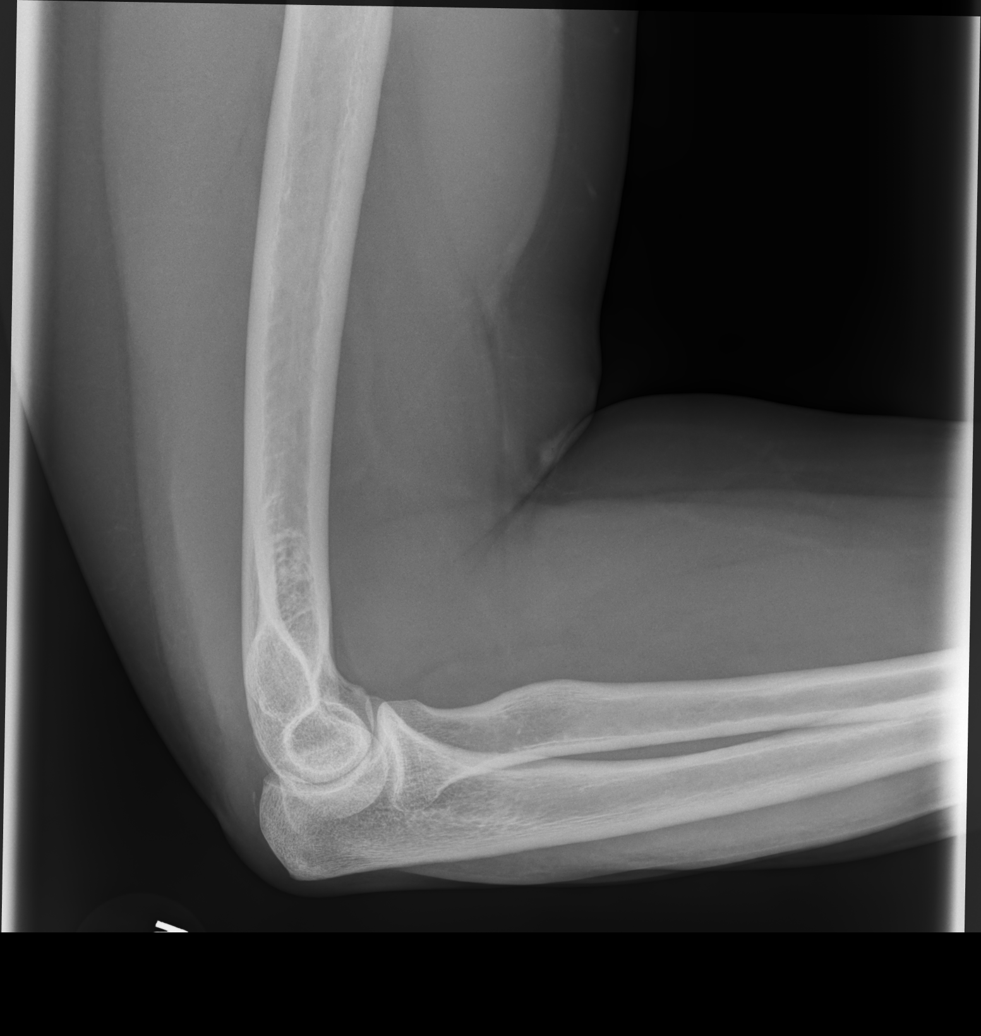

[5 of 5 positions shown; findings below may reference images not displayed]

FINDINGS: There is no evidence of fracture, dislocation, or joint effusion.
Normal alignment. Normal joint spaces. There is no evidence of
arthropathy or other focal bone abnormality. Soft tissues are
unremarkable.
IMPRESSION: Negative radiographs of the right elbow.

## 2022-04-06 NOTE — Telephone Encounter (Signed)
It looks like Dr. Lamonte Sakai got a routine CT chest in December 2023.  I do not think we need the one I ordered at all.  I think she had an elevated D-dimer so we were trying to order this as an outpatient, but she ended up going to the emergency room and got an appropriate screening for a pulmonary embolism.  Salvatore Marvel, MD [facility]

## 2022-04-09 ENCOUNTER — Other Ambulatory Visit: Payer: Self-pay | Admitting: Primary Care

## 2022-04-09 DIAGNOSIS — K219 Gastro-esophageal reflux disease without esophagitis: Secondary | ICD-10-CM

## 2022-04-10 ENCOUNTER — Other Ambulatory Visit: Payer: Self-pay | Admitting: Allergy & Immunology

## 2022-04-10 NOTE — Telephone Encounter (Signed)
Patient called in returning a call she received.

## 2022-04-10 NOTE — Telephone Encounter (Signed)
Please call patient:  Received notification from the pharmacy that her insurance will not cover Nexium 40 mg twice daily, only once daily.  Does she need it twice daily? If so, then we can try another medication.  Also needs CPE/follow up scheduled with me for late April for continued refills.

## 2022-04-10 NOTE — Telephone Encounter (Signed)
Unable to reach patient. Left voicemail to return call to our office.   

## 2022-04-11 NOTE — Telephone Encounter (Signed)
See refill request for further documentation

## 2022-04-11 NOTE — Telephone Encounter (Signed)
Spoke with patient advised of Clearence Cheek message. She request we submit a PA for this medication for covering twice daily. Will notify PA team/ start PA Scheduled follow up for late April

## 2022-04-13 MED ORDER — ESOMEPRAZOLE MAGNESIUM 40 MG PO CPDR: 40.0000 mg | DELAYED_RELEASE_CAPSULE | Freq: Two times a day (BID) | ORAL | 0 refills | Status: DC

## 2022-04-13 NOTE — Telephone Encounter (Signed)
Noted  

## 2022-04-16 ENCOUNTER — Other Ambulatory Visit: Payer: Self-pay | Admitting: Thoracic Surgery (Cardiothoracic Vascular Surgery)

## 2022-04-16 DIAGNOSIS — J849 Interstitial pulmonary disease, unspecified: Secondary | ICD-10-CM

## 2022-04-18 ENCOUNTER — Ambulatory Visit (INDEPENDENT_AMBULATORY_CARE_PROVIDER_SITE_OTHER): Payer: Commercial Managed Care - HMO | Admitting: Pulmonary Disease

## 2022-04-18 ENCOUNTER — Encounter: Payer: Self-pay | Admitting: Pulmonary Disease

## 2022-04-18 ENCOUNTER — Other Ambulatory Visit: Payer: Self-pay | Admitting: Pulmonary Disease

## 2022-04-18 VITALS — BP 134/80 | HR 82 | Temp 97.6°F | Ht 69.0 in | Wt 215.0 lb

## 2022-04-18 DIAGNOSIS — K219 Gastro-esophageal reflux disease without esophagitis: Secondary | ICD-10-CM | POA: Diagnosis not present

## 2022-04-18 DIAGNOSIS — J849 Interstitial pulmonary disease, unspecified: Secondary | ICD-10-CM

## 2022-04-18 DIAGNOSIS — G4733 Obstructive sleep apnea (adult) (pediatric): Secondary | ICD-10-CM

## 2022-04-18 DIAGNOSIS — J4540 Moderate persistent asthma, uncomplicated: Secondary | ICD-10-CM | POA: Diagnosis not present

## 2022-04-18 LAB — CBC WITH DIFFERENTIAL/PLATELET
Basophils Absolute: 0.1 10*3/uL (ref 0.0–0.1)
Basophils Relative: 0.5 % (ref 0.0–3.0)
Eosinophils Absolute: 0 10*3/uL (ref 0.0–0.7)
Eosinophils Relative: 0.2 % (ref 0.0–5.0)
HCT: 42.9 % (ref 36.0–46.0)
Hemoglobin: 14.2 g/dL (ref 12.0–15.0)
Lymphocytes Relative: 8.5 % — ABNORMAL LOW (ref 12.0–46.0)
Lymphs Abs: 1.3 10*3/uL (ref 0.7–4.0)
MCHC: 33 g/dL (ref 30.0–36.0)
MCV: 87.9 fl (ref 78.0–100.0)
Monocytes Absolute: 0.7 10*3/uL (ref 0.1–1.0)
Monocytes Relative: 4.8 % (ref 3.0–12.0)
Neutro Abs: 12.9 10*3/uL — ABNORMAL HIGH (ref 1.4–7.7)
Neutrophils Relative %: 86 % — ABNORMAL HIGH (ref 43.0–77.0)
Platelets: 240 10*3/uL (ref 150.0–400.0)
RBC: 4.88 Mil/uL (ref 3.87–5.11)
RDW: 14.1 % (ref 11.5–15.5)
WBC: 15 10*3/uL — ABNORMAL HIGH (ref 4.0–10.5)

## 2022-04-18 LAB — COMPREHENSIVE METABOLIC PANEL
ALT: 28 U/L (ref 0–35)
AST: 29 U/L (ref 0–37)
Albumin: 4.7 g/dL (ref 3.5–5.2)
Alkaline Phosphatase: 55 U/L (ref 39–117)
BUN: 19 mg/dL (ref 6–23)
CO2: 28 mEq/L (ref 19–32)
Calcium: 10.2 mg/dL (ref 8.4–10.5)
Chloride: 101 mEq/L (ref 96–112)
Creatinine, Ser: 0.85 mg/dL (ref 0.40–1.20)
GFR: 81.39 mL/min (ref >60.00)
Glucose, Bld: 94 mg/dL (ref 70–99)
Potassium: 4.1 mEq/L (ref 3.5–5.1)
Sodium: 140 mEq/L (ref 135–145)
Total Bilirubin: 0.9 mg/dL (ref 0.2–1.2)
Total Protein: 8 g/dL (ref 6.0–8.3)

## 2022-04-18 LAB — CK: Total CK: 104 U/L (ref 7–177)

## 2022-04-18 MED ORDER — PREDNISONE 10 MG PO TABS: ORAL_TABLET | ORAL | 1 refills | Status: DC

## 2022-04-18 MED ORDER — MOMETASONE FURO-FORMOTEROL FUM 200-5 MCG/ACT IN AERO: 2.0000 | INHALATION_SPRAY | Freq: Two times a day (BID) | RESPIRATORY_TRACT | 5 refills | Status: DC

## 2022-04-18 MED ORDER — SULFAMETHOXAZOLE-TRIMETHOPRIM 800-160 MG PO TABS: 1.0000 | ORAL_TABLET | ORAL | 5 refills | Status: DC

## 2022-04-18 NOTE — Patient Instructions (Signed)
We will check some labs today for further evaluation.  Check CBC, CMP Schedule you for a high-resolution CT at next available Continue prednisone at 30 mg/day without taper.  Will send in a new prescription Start Bactrim double strength 3 times a week Will also send in a prescription for [facility]  We will discuss her case at our conference to review the biopsy and repeat test I will follow back with you in 1 month for reevaluation and have a plan going forward.

## 2022-04-18 NOTE — Progress Notes (Signed)
Hannah Klein    [id number]    Aug 13, 1975  Primary Care Physician:Clark, Leticia Penna, NP  Referring Physician: Collene Gobble, MD [address] [place],  [place] [address]  Chief complaint: Consult for interstitial lung disease  HPI: 47 y.o. who  has a past medical history of Arthritis, Asthma, Environmental allergies, Esophageal dysphagia, Generalized anxiety disorder, GERD (gastroesophageal reflux disease), History of hiatal hernia, colonic polyps, Hypertension, Migraines, MRSA (methicillin resistant Staphylococcus aureus), and Sleep apnea.   Sent to the ILD clinic by Dr. Lamonte Sakai for evaluation of interstitial lung disease.  She has history of severe persistent asthma for many years and is followed by Dr. Ernst Bowler.  She had been on tezpire for 4 months in 2023.  Last dose of tezpire is in 2024.  In spite of the biologic she had significant dyspnea and noted to have a D-dimer.  She had a CTA which showed groundglass opacities concerning for pneumonitis/pneumonia and was admitted on 01/25/2022.  Treated with antibiotics, prednisone taper with minimal improvement.  She subsequently had surgical lung biopsy Dr. Kipp Brood in early January 2024 with pathology showing nonspecific pneumonitis/pulmonary fibrosis.  Workup in the past shows borderline elevated rheumatoid factor and she has rheumatology evaluation pending for later this year.  Currently on 30 mg of prednisone which is started on March 29, 2022 with some improvement in symptoms  She is currently on performist and Pulmicort nebulizers.  History also notable for hiatal hernia, esophageal dysphagia and she follows with Dr. Henrene Pastor.  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  No feather pillows or comforters ILD questionnaire 04/18/2022-negative Smoking history: 36-pack-year  smoker.  Quit in 2009 Travel history: Originally from [place].  No significant recent travel Relevant family history: Brother does have asthma  Outpatient Encounter Medications as of 04/18/2022  Medication Sig   acetaminophen (TYLENOL) 500 MG tablet Take 2 tablets (1,000 mg total) by mouth every 6 (six) hours as needed.   albuterol (VENTOLIN HFA) 108 (90 Base) MCG/ACT inhaler INHALE 1-2 PUFFS BY MOUTH EVERY 6 HOURS AS NEEDED FOR WHEEZE OR SHORTNESS OF BREATH   azelastine (ASTELIN) 0.1 % nasal spray Place 2 sprays into both nostrils 2 (two) times daily. Use in each nostril as directed   budesonide (PULMICORT) 0.25 MG/2ML nebulizer solution Take 2 mLs (0.25 mg total) by nebulization in the morning and at bedtime.   Cholecalciferol (VITAMIN D3) 125 MCG (5000 UT) TABS Take 5,000 Units by mouth daily.   clobetasol ointment (TEMOVATE) [id number] % Apply 1 Application topically 2 (two) times daily.   diclofenac Sodium (VOLTAREN ARTHRITIS PAIN) 1 % GEL Apply 2 g topically daily as needed (Chest pain).   esomeprazole (NEXIUM) 40 MG capsule Take 1 capsule (40 mg total) by mouth 2 (two) times daily. For heartburn.   famotidine (PEPCID) 40 MG tablet TAKE 1 TABLET BY MOUTH TWICE A DAY   hydrOXYzine (VISTARIL) 50 MG capsule TAKE 1 CAPSULE BY MOUTH ONCE OR TWICE DAILY AS NEEDED FOR ANXIETY (Patient taking differently: Take 50 mg by mouth 2 (two) times daily.)   levocetirizine (XYZAL) 5 MG tablet Take 1 tablet (5 mg total) by mouth in the morning and at bedtime.   Multiple Vitamin (MULTIVITAMIN) capsule Take 1 capsule by mouth daily.   predniSONE (DELTASONE) 10 MG tablet Take 3 tablets (30 mg total)  by mouth daily with breakfast for 21 days, THEN 2 tablets (20 mg total) daily with breakfast for 5 days, THEN 1 tablet (10 mg total) daily with breakfast for 5 days.   rosuvastatin (CRESTOR) 10 MG tablet Take 1 tablet (10 mg total) by mouth daily. for cholesterol.   sucralfate (CARAFATE) 1 g tablet Take 1 tablet (1 g  total) by mouth in the morning, at noon, and at bedtime. (Patient taking differently: Take 1 g by mouth 2 (two) times daily.)   vitamin B-12 (CYANOCOBALAMIN) 1000 MCG tablet Take 1,000 mcg by mouth daily.   vitamin C (ASCORBIC ACID) 500 MG tablet Take 500 mg by mouth daily.   vitamin E 180 MG (400 UNITS) capsule Take 400 Units by mouth daily.   formoterol (PERFOROMIST) 20 MCG/2ML nebulizer solution Take 2 mLs (20 mcg total) by nebulization 2 (two) times daily. (Patient taking differently: Take 20 mcg by nebulization daily as needed (Shortness of breath).)   [DISCONTINUED] traMADol (ULTRAM) 50 MG tablet Take 1 tablet (50 mg total) by mouth every 6 (six) hours as needed (mild pain).   No facility-administered encounter medications on file as of 04/18/2022.    Allergies as of 04/18/2022 - Review Complete 04/18/2022  Allergen Reaction Noted   Advair diskus [fluticasone-salmeterol] Other (See Comments) 04/26/2015   Flonase [fluticasone]  08/03/2021   Lipitor [atorvastatin calcium] Other (See Comments) 01/09/2018    Past Medical History:  Diagnosis Date   Arthritis    Asthma    Environmental allergies    Esophageal dysphagia    Generalized anxiety disorder    GERD (gastroesophageal reflux disease)    History of hiatal hernia    Hx of colonic polyps    Hypertension    Migraines    MRSA (methicillin resistant Staphylococcus aureus)    Lt upper arm  approx 8 years ago   Sleep apnea     Past Surgical History:  Procedure Laterality Date   CHOLECYSTECTOMY     DILATION AND CURETTAGE OF UTERUS     INTERCOSTAL NERVE BLOCK Right 03/08/2022   Procedure: INTERCOSTAL NERVE BLOCK;  Surgeon: [name], MD;  Location: [facility];  Service: Thoracic;  Laterality: Right;   NECK SURGERY     Fusion to C6 and C7   TYMPANOSTOMY TUBE PLACEMENT     WRIST ARTHROSCOPY WITH DEBRIDEMENT Left 02/27/2018   Procedure: WRIST ARTHROSCOPY WITH DEBRIDEMENT;  Surgeon: Leanora Cover, MD;  Location: [facility];  Service: Orthopedics;  Laterality: Left;    Family History  Problem Relation Age of Onset   Eczema Mother    Allergic rhinitis Brother    Asthma Brother    Asthma Brother    Allergic rhinitis Brother    Urticaria Other    Angioedema Other     Social History   Socioeconomic History   Marital status: Single    Spouse name: Not on file   Number of children: 0   Years of education: Not on file   Highest education level: Not on file  Occupational History   Not on file  Tobacco Use   Smoking status: Former    Packs/day: 2.00    Years: 18.00    Total pack years: 36.00    Types: Cigarettes    Start date: 21    Quit date: 2009    Years since quitting: 15.1    Passive exposure: Current   Smokeless tobacco: Never  Vaping Use   Vaping Use: Never used  Substance  and Sexual Activity   Alcohol use: No    Alcohol/week: 0.0 standard drinks of alcohol   Drug use: No   Sexual activity: Not Currently  Other Topics Concern   Not on file  Social History Narrative   Single.   Moved from [place].   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   Social Determinants of Health   Financial Resource Strain: Not on file  Food Insecurity: No Food Insecurity (01/26/2022)   Hunger Vital Sign    Worried About Running Out of Food in the Last Year: Never true    Ran Out of Food in the Last Year: Never true  Transportation Needs: No Transportation Needs (01/26/2022)   PRAPARE - Hydrologist (Medical): No    Lack of Transportation (Non-Medical): No  Physical Activity: Not on file  Stress: Not on file  Social Connections: Not on file  Intimate Partner Violence: Not At Risk (01/26/2022)   Humiliation, Afraid, Rape, and Kick questionnaire    Fear of Current or Ex-Partner: No    Emotionally Abused: No    Physically Abused: No    Sexually Abused: No    Review of systems: Review of Systems  Constitutional: Negative for fever and chills.  HENT: Negative.    Eyes: Negative for blurred vision.  Respiratory: as per HPI  Cardiovascular: Negative for chest pain and palpitations.  Gastrointestinal: Negative for vomiting, diarrhea, blood per rectum. Genitourinary: Negative for dysuria, urgency, frequency and hematuria.  Musculoskeletal: Negative for myalgias, back pain and joint pain.  Skin: Negative for itching and rash.  Neurological: Negative for dizziness, tremors, focal weakness, seizures and loss of consciousness.  Endo/Heme/Allergies: Negative for environmental allergies.  Psychiatric/Behavioral: Negative for depression, suicidal ideas and hallucinations.  All other systems reviewed and are negative.  Physical Exam: Blood pressure 134/80, pulse 82, temperature 97.6 F (36.4 C), temperature source Oral, height 5' 9"$  (1.753 m), weight 215 lb (97.5 kg), SpO2 98 %. Gen:      No acute distress HEENT:  EOMI, sclera anicteric Neck:     No masses; no thyromegaly Lungs:    Clear to auscultation bilaterally; normal respiratory effort CV:         Regular rate and rhythm; no murmurs Abd:      + bowel sounds; soft, non-tender; no palpable masses, no distension Ext:    No edema; adequate peripheral perfusion Skin:      Warm and dry; no rash Neuro: alert and oriented x 3 Psych: normal mood and affect  Data Reviewed: Imaging: High resolution CT 04/11/2020-clear lungs with 4 mm lower lobe nodule Super D CT 04/07/2021-patchy groundglass opacities in the mid to lower lungs.  Stable lung nodules CTA 01/25/2022-no PE, moderate bilateral peripheral predominant groundglass opacities CT chest 02/22/2022-progressive interstitial lung disease with patchy groundglass, subpleural reticulation, traction bronchiectasis bilaterally with basal gradient.  Probable UIP pattern I have reviewed the images personally.  PFTs: 03/07/2022 FVC 1.77 [41%], FEV1 1.60 [47%], F/F91, DLCO 7.03 [28%] Severe restrictive physiology.  Severe diffusion  impairment.  Labs: Rheumatoid factor 01/25/2022- 41 ANA 01/25/2022-negative CCP 01/26/2022 less than 1 ANCA 08/03/2021-negative  Surgical lung biopsy 03/08/2022 No unclassifiable interstitial, chronic fibroinflammatory process.  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  Although her rheumatoid factor is elevated it is borderline and she does not have significant symptoms of arthritis.  There is no exposure history that I can elicit  I suppose this could be pneumonitis  from tezspire but she did have early changes of interstitial lung disease even before starting this medication on a CT scan in February 2023.  And CT scan shows more chronic changes of traction bronchiectasis  Will get a comprehensive panel to evaluate connective tissue disease.  Repeat high-res CT to reevaluate the lungs Continue prednisone at 30 mg/day without taper for now until we have more data to proceed Start Bactrim double strength 3 times a week for pneumocystis prophylaxis  I will review her imaging, labs and biopsy at multidisciplinary conference  Severe persistent asthma Off tezspire since November 2024 Restart Dulera.  She prefers the inhaler over to performist and Pulmicort nebulizer  OSA Noncompliant with CPAP  Plan/Recommendations: Recheck labs High-res CT Continue prednisone at 30 mg/day without taper Bactrim for prophylaxis Start Dulera Discussion at multidisciplinary conference  Marshell Garfinkel MD [facility] 04/18/2022, 9:43 AM  CC: Collene Gobble, MD

## 2022-04-19 ENCOUNTER — Ambulatory Visit
Admission: RE | Admit: 2022-04-19 | Discharge: 2022-04-19 | Disposition: A | Discharge status: Home / Self Care | Payer: Commercial Managed Care - HMO | Source: Ambulatory Visit | Attending: Thoracic Surgery (Cardiothoracic Vascular Surgery) | Admitting: Thoracic Surgery (Cardiothoracic Vascular Surgery)

## 2022-04-19 DIAGNOSIS — J849 Interstitial pulmonary disease, unspecified: Secondary | ICD-10-CM

## 2022-04-20 ENCOUNTER — Ambulatory Visit (INDEPENDENT_AMBULATORY_CARE_PROVIDER_SITE_OTHER): Payer: Self-pay | Admitting: Thoracic Surgery (Cardiothoracic Vascular Surgery)

## 2022-04-20 VITALS — BP 143/84 | HR 74 | Resp 20 | Ht 69.0 in | Wt 214.0 lb

## 2022-04-20 DIAGNOSIS — J849 Interstitial pulmonary disease, unspecified: Secondary | ICD-10-CM

## 2022-04-20 LAB — ANTI-DNA ANTIBODY, DOUBLE-STRANDED: ds DNA Ab: 2 IU/mL

## 2022-04-20 LAB — CYCLIC CITRUL PEPTIDE ANTIBODY, IGG: Cyclic Citrullin Peptide Ab: <16 UNITS

## 2022-04-20 LAB — SJOGREN'S SYNDROME ANTIBODS(SSA + SSB)
SSA (Ro) (ENA) Antibody, IgG: >8 AI — AB
SSB (La) (ENA) Antibody, IgG: <1 AI

## 2022-04-20 LAB — RHEUMATOID FACTOR: Rheumatoid fact SerPl-aCnc: 25 IU/mL — ABNORMAL HIGH (ref <14)

## 2022-04-20 LAB — ALDOLASE: Aldolase: 7.5 U/L (ref <=8.1)

## 2022-04-20 LAB — ANTI-NUCLEAR AB-TITER (ANA TITER): ANA Titer 1: 1:1280 {titer} — ABNORMAL HIGH

## 2022-04-20 LAB — ANA: Anti Nuclear Antibody (ANA): POSITIVE — AB

## 2022-04-20 LAB — ANTI-SCLERODERMA ANTIBODY: Scleroderma (Scl-70) (ENA) Antibody, IgG: <1 AI

## 2022-04-20 NOTE — Progress Notes (Signed)
      [address]       ,[place] [address]             [phone]        Hannah Klein Hannah Klein Medical Record [id number] Date of Birth: [birth year]  Referring: Hannah Gobble, MD Primary Care: Hannah Koch, NP Primary Cardiologist:None  Reason for visit:   follow-up  History of Present Illness:     47 year old female presents for 1 month follow-up appointment.  She has been started on steroids with improvement in her respiratory status.  Physical Exam: BP (!) 143/84 (BP Location: Left Arm, Patient Position: Sitting, Cuff Size: Large)   Pulse 74   Resp 20   Ht 5' 9"$  (1.753 m)   Wt 214 lb (97.1 kg)   SpO2 93% Comment: RA  BMI 31.60 kg/m   Alert NAD Abdomen, ND No peripheral edema   Diagnostic Studies & Laboratory data: CXR: Clear   Assessment / Plan:   47 year old female status post robotic assisted thoracoscopy with wedge resection for interstitial lung disease.  She has been placed in contact with the interstitial lung disease clinic.  She will continue to follow-up with them.   Hannah Klein 04/20/2022 3:55 PM

## 2022-04-21 ENCOUNTER — Other Ambulatory Visit: Payer: Self-pay | Admitting: Primary Care

## 2022-04-24 ENCOUNTER — Ambulatory Visit
Admission: RE | Admit: 2022-04-24 | Discharge: 2022-04-24 | Disposition: A | Discharge status: Home / Self Care | Payer: Commercial Managed Care - HMO | Source: Ambulatory Visit | Attending: Pulmonary Disease | Admitting: Pulmonary Disease

## 2022-04-24 ENCOUNTER — Ambulatory Visit: Payer: Commercial Managed Care - HMO | Admitting: Emergency Medicine

## 2022-04-24 DIAGNOSIS — J849 Interstitial pulmonary disease, unspecified: Secondary | ICD-10-CM

## 2022-04-24 LAB — HYPERSENSITIVITY PNEUMONITIS
A. Pullulans Abs: NEGATIVE
A.Fumigatus #1 Abs: NEGATIVE
Micropolyspora faeni, IgG: NEGATIVE
Pigeon Serum Abs: NEGATIVE
Thermoact. Saccharii: NEGATIVE
Thermoactinomyces vulgaris, IgG: NEGATIVE

## 2022-04-24 LAB — RNP ANTIBODIES: ENA RNP Ab: 1.2 AI — ABNORMAL HIGH (ref 0.0–0.9)

## 2022-04-26 ENCOUNTER — Ambulatory Visit: Payer: Commercial Managed Care - HMO | Admitting: Allergy & Immunology

## 2022-04-28 ENCOUNTER — Other Ambulatory Visit: Payer: Self-pay | Admitting: Primary Care

## 2022-04-28 DIAGNOSIS — F411 Generalized anxiety disorder: Secondary | ICD-10-CM

## 2022-05-06 ENCOUNTER — Other Ambulatory Visit: Payer: Self-pay | Admitting: Allergy & Immunology

## 2022-05-08 ENCOUNTER — Ambulatory Visit (INDEPENDENT_AMBULATORY_CARE_PROVIDER_SITE_OTHER): Payer: Commercial Managed Care - HMO | Admitting: Pulmonary Disease

## 2022-05-08 DIAGNOSIS — J849 Interstitial pulmonary disease, unspecified: Secondary | ICD-10-CM | POA: Diagnosis not present

## 2022-05-10 LAB — MYOMARKER 3 PLUS PROFILE (RDL)
Anti-EJ Ab (RDL): NEGATIVE
Anti-Jo-1 Ab (RDL): <20 Units (ref <20)
Anti-Ku Ab (RDL): NEGATIVE
Anti-MDA-5 Ab (CADM-140)(RDL): <20 Units (ref <20)
Anti-Mi-2 Ab (RDL): NEGATIVE
Anti-NXP-2 (P140) Ab (RDL): <20 Units (ref <20)
Anti-OJ Ab (RDL): NEGATIVE
Anti-PL-12 Ab (RDL: NEGATIVE
Anti-PL-7 Ab (RDL): NEGATIVE
Anti-PM/Scl-100 Ab (RDL): <20 Units (ref <20)
Anti-SAE1 Ab, IgG (RDL): <20 Units (ref <20)
Anti-SRP Ab (RDL): NEGATIVE
Anti-SS-A 52kD Ab, IgG (RDL): >200 Units — ABNORMAL HIGH (ref <20)
Anti-TIF-1gamma Ab (RDL): <20 Units (ref <20)
Anti-U1 RNP Ab (RDL): <20 Units (ref <20)
Anti-U2 RNP Ab (RDL): NEGATIVE
Anti-U3 RNP (Fibrillarin)(RDL): NEGATIVE

## 2022-05-11 NOTE — Progress Notes (Signed)
   Interstitial Lung Disease Multidisciplinary Conference   Hannah Klein    MRN [id number]    DOB 1975-06-20  Primary Care 65, Leticia Penna, NP  Referring Physician: Dr. Marshell Garfinkel MD  Time of Conference: 7.30am- 8.30am Date of conference: Dr. Marshell Garfinkel Location of Conference: -  Virtual  Participating Pulmonary: Dr. Brand Males, MD,  Dr Marshell Garfinkel, MD Pathology: Dr Jaquita Folds, MD ,  Radiology: Dr Salvatore Marvel MD Others:   Brief History:  47 y.o. who  has a past medical history of Arthritis, Asthma, Environmental allergies, Esophageal dysphagia, Generalized anxiety disorder, GERD (gastroesophageal reflux disease), History of hiatal hernia, colonic polyps, Hypertension, Migraines, MRSA (methicillin resistant Staphylococcus aureus), and Sleep apnea.   Evaluation for ILD that was discovered in Nov 2023 while on [place] and question penumonitis from the therapy. Underwent a surgical lung biopsy in Jan 2024. Rheumatoid factor is borderline elevated. PFTs with restriction and diffusion impairment. Repeat CT and labs are pending.  Please review serial imaging dating back to 2022 and biopsy.   PFT    Latest Ref Rng & Units 03/07/2022    9:39 AM 04/29/2020    8:56 AM  PFT Results  FVC-Pre L 1.79  3.60   FVC-Predicted Pre % 42  87   FVC-Post L 1.77  3.71   FVC-Predicted Post % 41  89   Pre FEV1/FVC % % 86  77   Post FEV1/FCV % % 91  86   FEV1-Pre L 1.53  2.77   FEV1-Predicted Pre % 45  83   FEV1-Post L 1.60  3.19   DLCO uncorrected ml/min/mmHg 7.03  21.60   DLCO UNC% % 28  88   DLCO corrected ml/min/mmHg 6.98  21.53   DLCO COR %Predicted % 27  87   DLVA Predicted % 71  101       MDD discussion of CT scan    HRCT: 04/25/2022 CT chest 04/07/2021 HRCT: 04/11/2020  Progressive interstitial lung disease with patchy groundglass, subpleural reticulation, traction bronchiectasis bilaterally with basal gradient.  Felt to be an alternate  pattern consistent with chronic fibrotic phase of NSIP. Subtle changes were seen back in the CT in February 2023 and appears to be progressing rapidly on latest scan in April 25, 2022  - What is the final conclusion per 2018 ATS/Fleischner Criteria -alternate pattern    Pathology discussion of biopsy: Homogeneous areas of interstitial infiltrate with even lymphocytic distribution and infiltrate, scattered fibrosis with architectural distortion.  There is uniform involvement of all lobes.  There is no hyalin fibrosis.  Scattered fibroblastic foci noted Mixed fibrotic and inflammatory NSIP pattern.  With fibroblastic foci it was felt to be aggressive in nature with poor prognosis.  In the upper lobe there is 1 focal area of granulomas which is not felt to be significant.   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  Time Spent in preparation and discussion:  > 30 min  SIGNATURE   Marshell Garfinkel MD [facility] 05/11/2022, 6:34 PM  ...................................................................................................................Marland Kitchen

## 2022-05-16 ENCOUNTER — Other Ambulatory Visit: Payer: Commercial Managed Care - HMO

## 2022-05-17 ENCOUNTER — Ambulatory Visit (INDEPENDENT_AMBULATORY_CARE_PROVIDER_SITE_OTHER): Payer: Commercial Managed Care - HMO | Admitting: Pulmonary Disease

## 2022-05-17 ENCOUNTER — Encounter: Payer: Self-pay | Admitting: Pulmonary Disease

## 2022-05-17 ENCOUNTER — Other Ambulatory Visit (INDEPENDENT_AMBULATORY_CARE_PROVIDER_SITE_OTHER): Payer: Commercial Managed Care - HMO

## 2022-05-17 VITALS — BP 140/84 | HR 70 | Temp 97.6°F | Ht 68.0 in | Wt 220.0 lb

## 2022-05-17 DIAGNOSIS — Z5181 Encounter for therapeutic drug level monitoring: Secondary | ICD-10-CM

## 2022-05-17 DIAGNOSIS — J849 Interstitial pulmonary disease, unspecified: Secondary | ICD-10-CM | POA: Diagnosis not present

## 2022-05-17 DIAGNOSIS — J4540 Moderate persistent asthma, uncomplicated: Secondary | ICD-10-CM

## 2022-05-17 LAB — COMPREHENSIVE METABOLIC PANEL
ALT: 19 U/L (ref 0–35)
AST: 25 U/L (ref 0–37)
Albumin: 4.5 g/dL (ref 3.5–5.2)
Alkaline Phosphatase: 69 U/L (ref 39–117)
BUN: 15 mg/dL (ref 6–23)
CO2: 31 mEq/L (ref 19–32)
Calcium: 10 mg/dL (ref 8.4–10.5)
Chloride: 99 mEq/L (ref 96–112)
Creatinine, Ser: 0.89 mg/dL (ref 0.40–1.20)
GFR: 76.97 mL/min (ref >60.00)
Glucose, Bld: 86 mg/dL (ref 70–99)
Potassium: 3.7 mEq/L (ref 3.5–5.1)
Sodium: 140 mEq/L (ref 135–145)
Total Bilirubin: 1.3 mg/dL — ABNORMAL HIGH (ref 0.2–1.2)
Total Protein: 7.7 g/dL (ref 6.0–8.3)

## 2022-05-17 LAB — CBC WITH DIFFERENTIAL/PLATELET
Basophils Absolute: 0.1 10*3/uL (ref 0.0–0.1)
Basophils Relative: 0.5 % (ref 0.0–3.0)
Eosinophils Absolute: 0.1 10*3/uL (ref 0.0–0.7)
Eosinophils Relative: 0.4 % (ref 0.0–5.0)
HCT: 43.6 % (ref 36.0–46.0)
Hemoglobin: 14.6 g/dL (ref 12.0–15.0)
Lymphocytes Relative: 10.5 % — ABNORMAL LOW (ref 12.0–46.0)
Lymphs Abs: 1.4 10*3/uL (ref 0.7–4.0)
MCHC: 33.4 g/dL (ref 30.0–36.0)
MCV: 88.5 fl (ref 78.0–100.0)
Monocytes Absolute: 0.7 10*3/uL (ref 0.1–1.0)
Monocytes Relative: 5.3 % (ref 3.0–12.0)
Neutro Abs: 11.3 10*3/uL — ABNORMAL HIGH (ref 1.4–7.7)
Neutrophils Relative %: 83.3 % — ABNORMAL HIGH (ref 43.0–77.0)
Platelets: 177 10*3/uL (ref 150.0–400.0)
RBC: 4.92 Mil/uL (ref 3.87–5.11)
RDW: 15.1 % (ref 11.5–15.5)
WBC: 13.6 10*3/uL — ABNORMAL HIGH (ref 4.0–10.5)

## 2022-05-17 MED ORDER — MYCOPHENOLATE MOFETIL 500 MG PO TABS: 500.0000 mg | ORAL_TABLET | Freq: Two times a day (BID) | ORAL | 2 refills | Status: DC

## 2022-05-17 NOTE — Addendum Note (Signed)
Addended byMarshell Garfinkel on: 05/17/2022 02:56 PM   Modules accepted: Orders

## 2022-05-17 NOTE — Patient Instructions (Addendum)
As per our discussion we will start treatment with CellCept 500 mg twice daily Check additional labs today including CBC, QuantiFERON and hepatitis panel, CMP, HIV I will also place an order for Rituxan infusions  Follow-up in 2 to 4 weeks

## 2022-05-17 NOTE — Progress Notes (Signed)
Hannah Klein    [id number]    April 04, 1975  Primary Care Physician:Clark, Leticia Penna, NP  Referring Physician: Pleas Koch, NP [place],  [place] [address]  Chief complaint: Consult for interstitial lung disease  HPI: 47 y.o. who  has a past medical history of Arthritis, Asthma, Environmental allergies, Esophageal dysphagia, Generalized anxiety disorder, GERD (gastroesophageal reflux disease), History of hiatal hernia, colonic polyps, Hypertension, Migraines, MRSA (methicillin resistant Staphylococcus aureus), and Sleep apnea.   Sent to the ILD clinic by Dr. Lamonte Sakai for evaluation of interstitial lung disease.  She has history of severe persistent asthma for many years and is followed by Dr. Ernst Bowler.  She had been on tezpire for 4 months in 2023.  Last dose of tezpire is in 2024.  In spite of the biologic she had significant dyspnea and noted to have a D-dimer.  She had a CTA which showed groundglass opacities concerning for pneumonitis/pneumonia and was admitted on 01/25/2022.  Treated with antibiotics, prednisone taper with minimal improvement.  She subsequently had surgical lung biopsy Dr. Kipp Brood in early January 2024 with pathology showing nonspecific pneumonitis/pulmonary fibrosis.  Workup in the past shows borderline elevated rheumatoid factor and she has rheumatology evaluation pending for later this year.  Currently on 30 mg of prednisone which is started on March 29, 2022 with some improvement in symptoms  She is currently on performist and Pulmicort nebulizers.  History also notable for hiatal hernia, esophageal dysphagia and she follows with Dr. Henrene Pastor.  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  No feather pillows or comforters ILD questionnaire 04/18/2022-negative Smoking history: 36-pack-year smoker.   Quit in 2009 Travel history: Originally from [place].  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  Outpatient Encounter Medications as of 05/17/2022  Medication Sig   acetaminophen (TYLENOL) 500 MG tablet Take 2 tablets (1,000 mg total) by mouth every 6 (six) hours as needed.   albuterol (VENTOLIN HFA) 108 (90 Base) MCG/ACT inhaler INHALE 1-2 PUFFS BY MOUTH EVERY 6 HOURS AS NEEDED FOR WHEEZE OR SHORTNESS OF BREATH   Azelastine HCl 137 MCG/SPRAY SOLN PLACE 2 SPRAYS INTO BOTH NOSTRILS 2 (TWO) TIMES DAILY. USE IN EACH NOSTRIL AS DIRECTED   budesonide (PULMICORT) 0.25 MG/2ML nebulizer solution Take 2 mLs (0.25 mg total) by nebulization in the morning and at bedtime.   Cholecalciferol (VITAMIN D3) 125 MCG (5000 UT) TABS Take 5,000 Units by mouth daily.   clobetasol ointment (TEMOVATE) [id number] % Apply 1 Application topically 2 (two) times daily.   diclofenac Sodium (VOLTAREN ARTHRITIS PAIN) 1 % GEL Apply 2 g topically daily as needed (Chest pain).   esomeprazole (NEXIUM) 40 MG capsule Take 1 capsule (40 mg total) by mouth 2 (two) times daily. For heartburn.   famotidine (PEPCID) 40 MG tablet TAKE 1 TABLET BY MOUTH TWICE A DAY   hydrOXYzine (VISTARIL) 50 MG capsule TAKE 1 CAPSULE BY MOUTH ONCE OR TWICE DAILY AS NEEDED FOR ANXIETY   mometasone-formoterol (DULERA) 200-5 MCG/ACT AERO Inhale 2 puffs into the lungs every 12 (twelve) hours.   Multiple Vitamin (MULTIVITAMIN) capsule Take 1  capsule by mouth daily.   predniSONE (DELTASONE) 10 MG tablet Take 30mg  daily   rosuvastatin (CRESTOR) 10 MG tablet Take 1 tablet (10 mg total) by mouth daily. for cholesterol.   sucralfate (CARAFATE) 1 g tablet Take 1 tablet (1 g total) by mouth in the  morning, at noon, and at bedtime. (Patient taking differently: Take 1 g by mouth 2 (two) times daily.)   sulfamethoxazole-trimethoprim (BACTRIM DS) 800-160 MG tablet Take 1 tablet by mouth 3 (three) times a week.   vitamin B-12 (CYANOCOBALAMIN) 1000 MCG tablet Take 1,000 mcg by mouth daily.   vitamin C (ASCORBIC ACID) 500 MG tablet Take 500 mg by mouth daily.   vitamin E 180 MG (400 UNITS) capsule Take 400 Units by mouth daily.   formoterol (PERFOROMIST) 20 MCG/2ML nebulizer solution Take 2 mLs (20 mcg total) by nebulization 2 (two) times daily. (Patient taking differently: Take 20 mcg by nebulization daily as needed (Shortness of breath).)   levocetirizine (XYZAL) 5 MG tablet Take 1 tablet (5 mg total) by mouth in the morning and at bedtime.   No facility-administered encounter medications on file as of 05/17/2022.   Physical Exam: Blood pressure (!) 140/84, pulse 70, temperature 97.6 F (36.4 C), temperature source Oral, height 5\' 8"  (1.727 m), weight 220 lb (99.8 kg), SpO2 98 %. Gen:      No acute distress HEENT:  EOMI, sclera anicteric Neck:     No masses; no thyromegaly Lungs:    Clear to auscultation bilaterally; normal respiratory effort CV:         Regular rate and rhythm; no murmurs Abd:      + bowel sounds; soft, non-tender; no palpable masses, no distension Ext:    No edema; adequate peripheral perfusion Skin:      Warm and dry; no rash Neuro: alert and oriented x 3 Psych: normal mood and affect   Data Reviewed: Imaging: High resolution CT 04/11/2020-clear lungs with 4 mm lower lobe nodule Super D CT 04/07/2021-patchy groundglass opacities in the mid to lower lungs.  Stable lung nodules CTA 01/25/2022-no PE, moderate bilateral peripheral predominant groundglass opacities CT chest 02/22/2022-progressive interstitial lung disease with patchy groundglass, subpleural reticulation, traction bronchiectasis bilaterally with basal gradient.  Probable UIP pattern High resolution CT  04/25/2022-increasingly organized return of groundglass opacities with subpleural reticular densities, traction bronchiectasis.  Indeterminate for UIP I have reviewed the images personally.  PFTs: 03/07/2022 FVC 1.77 [41%], FEV1 1.60 [47%], F/F91, DLCO 7.03 [28%] Severe restrictive physiology.  Severe diffusion impairment.  Labs: Rheumatoid factor 01/25/2022- 41 ANA 01/25/2022-negative CCP 01/26/2022 less than 1 ANCA 08/03/2021-negative  CTD serologies 04/18/2022 ANA 1: 1280 ENA RNP 1.2, SSA greater than 200  Surgical lung biopsy 03/08/2022 No unclassifiable interstitial, chronic fibroinflammatory process.  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  Suspect she has Sjogren syndrome as SSA is markedly elevated along with ANA and she has symptoms of dry mouth and dry eyes.  We have referred her to rheumatology  Due to rapid progression on CT scan and pathology findings with fibroblastic foci we felt that the phenotype is aggressive and recommended aggressive immunotherapy and addition of antifibrotic therapy.  Check baseline labs, QuantiFERON gold test, hepatitis panel, HIV test and start CellCept today Start paperwork for Rituxan injections.  There are recent reports that Rituxan plus CellCept shows improvement in connective tissue disease related  ILD (European Respiratory Journal 2023; DOI: 10.1183/13993003.02071-2022).   Once stabilized on this medical insurance she would need addition of antifibrotic therapy Continue prednisone at 30 mg/day for now She is not able to tolerate Bactrim as it is causing flushing.  Will hold for now.  Consider atovaquone prophylaxis on return visit  Severe persistent asthma Off tezspire since November 2024 Continue Dulera.  She prefers the inhaler over to performist and Pulmicort  nebulizer  OSA Noncompliant with CPAP  Plan/Recommendations: Check labs Start CellCept Order Rituxan Continue prednisone  Marshell Garfinkel MD [facility] 05/17/2022, 8:52 AM  CC: Pleas Koch, NP

## 2022-05-18 LAB — HEPATITIS PANEL(REFL)
HEPATITIS C ANTIBODY REFILL$(REFL): NONREACTIVE
Hep B Core Total Ab: NONREACTIVE
Hep B S Ab: NONREACTIVE
Hepatitis A AB,Total: NONREACTIVE
Hepatitis B Surface Ag: NONREACTIVE

## 2022-05-18 LAB — REFLEX TIQ

## 2022-05-19 LAB — QUANTIFERON-TB GOLD PLUS
Mitogen-NIL: 0.18 IU/mL
NIL: 0.04 IU/mL
QuantiFERON-TB Gold Plus: UNDETERMINED — AB
TB1-NIL: <0 IU/mL
TB2-NIL: <0 IU/mL

## 2022-05-19 LAB — HIV ANTIBODY (ROUTINE TESTING W REFLEX): HIV 1&2 Ab, 4th Generation: NONREACTIVE

## 2022-05-20 ENCOUNTER — Other Ambulatory Visit: Payer: Self-pay | Admitting: Primary Care

## 2022-05-20 DIAGNOSIS — E785 Hyperlipidemia, unspecified: Secondary | ICD-10-CM

## 2022-05-25 ENCOUNTER — Encounter: Payer: Self-pay | Admitting: Pulmonary Disease

## 2022-05-25 NOTE — Telephone Encounter (Signed)
Dr. Vaughan Browner please review and advise on the following My Chart message:   Hannah Klein "[name]"  P [facility] (supporting Marshell Garfinkel, MD)3 hours ago (7:44 AM)    I have been having random acky in my right lung since biopsy.  Usually Tylenol helps it. We discussed this at my appointment.  It has gotten worse. Now i am having more of a constant pain, sometimes achy, sometimes sharp and I am getting concerned.  Tylenol is not helping this.  I'm not sure what to do.     Thank you  Hannah Klein    Thank you

## 2022-05-31 ENCOUNTER — Ambulatory Visit (INDEPENDENT_AMBULATORY_CARE_PROVIDER_SITE_OTHER): Payer: Commercial Managed Care - HMO | Admitting: Primary Care

## 2022-05-31 ENCOUNTER — Ambulatory Visit (INDEPENDENT_AMBULATORY_CARE_PROVIDER_SITE_OTHER): Payer: Commercial Managed Care - HMO

## 2022-05-31 ENCOUNTER — Other Ambulatory Visit: Payer: Commercial Managed Care - HMO

## 2022-05-31 ENCOUNTER — Encounter: Payer: Self-pay | Admitting: *Deleted

## 2022-05-31 ENCOUNTER — Other Ambulatory Visit: Payer: Self-pay | Admitting: *Deleted

## 2022-05-31 VITALS — BP 140/84 | HR 85 | Temp 99.0°F | Ht 68.0 in | Wt 221.0 lb

## 2022-05-31 DIAGNOSIS — J849 Interstitial pulmonary disease, unspecified: Secondary | ICD-10-CM | POA: Diagnosis not present

## 2022-05-31 DIAGNOSIS — E785 Hyperlipidemia, unspecified: Secondary | ICD-10-CM | POA: Diagnosis not present

## 2022-05-31 DIAGNOSIS — M3502 Sicca syndrome with lung involvement: Secondary | ICD-10-CM

## 2022-05-31 DIAGNOSIS — R52 Pain, unspecified: Secondary | ICD-10-CM

## 2022-05-31 DIAGNOSIS — I10 Essential (primary) hypertension: Secondary | ICD-10-CM

## 2022-05-31 DIAGNOSIS — Z0000 Encounter for general adult medical examination without abnormal findings: Secondary | ICD-10-CM | POA: Diagnosis not present

## 2022-05-31 DIAGNOSIS — Z1231 Encounter for screening mammogram for malignant neoplasm of breast: Secondary | ICD-10-CM | POA: Diagnosis not present

## 2022-05-31 DIAGNOSIS — J4550 Severe persistent asthma, uncomplicated: Secondary | ICD-10-CM

## 2022-05-31 DIAGNOSIS — E559 Vitamin D deficiency, unspecified: Secondary | ICD-10-CM

## 2022-05-31 DIAGNOSIS — J301 Allergic rhinitis due to pollen: Secondary | ICD-10-CM

## 2022-05-31 DIAGNOSIS — G4733 Obstructive sleep apnea (adult) (pediatric): Secondary | ICD-10-CM

## 2022-05-31 DIAGNOSIS — R911 Solitary pulmonary nodule: Secondary | ICD-10-CM

## 2022-05-31 DIAGNOSIS — K219 Gastro-esophageal reflux disease without esophagitis: Secondary | ICD-10-CM

## 2022-05-31 DIAGNOSIS — F411 Generalized anxiety disorder: Secondary | ICD-10-CM

## 2022-05-31 LAB — VITAMIN D 25 HYDROXY (VIT D DEFICIENCY, FRACTURES): VITD: 42.22 ng/mL (ref 30.00–100.00)

## 2022-05-31 LAB — LIPID PANEL
Cholesterol: 247 mg/dL — ABNORMAL HIGH (ref 0–200)
HDL: 66.8 mg/dL (ref >39.00)
NonHDL: 180.56
Total CHOL/HDL Ratio: 4
Triglycerides: 252 mg/dL — ABNORMAL HIGH (ref 0.0–149.0)
VLDL: 50.4 mg/dL — ABNORMAL HIGH (ref 0.0–40.0)

## 2022-05-31 LAB — HEMOGLOBIN A1C: Hgb A1c MFr Bld: 5.5 % (ref 4.6–6.5)

## 2022-05-31 LAB — LDL CHOLESTEROL, DIRECT: Direct LDL: 140 mg/dL

## 2022-05-31 LAB — VITAMIN B12: Vitamin B-12: 592 pg/mL (ref 211–911)

## 2022-05-31 NOTE — Assessment & Plan Note (Signed)
Following with pulmonology, new referral placed to [facility] pulmonology per patient request.  She has failed multiple treatment regimens.  Continue budesonide 0.25 mg per 2 mL twice daily, Dulera 200-5 mcg 2 puffs twice daily. Reviewed pulmonology notes from February and March 2024.

## 2022-05-31 NOTE — Assessment & Plan Note (Signed)
Reviewed CT chest from February 2024. Following with pulmonology.

## 2022-05-31 NOTE — Patient Instructions (Signed)
Stop by the lab prior to leaving today. I will notify you of your results once received.   Call [facility] to schedule your mammogram.   You will either be contacted via phone regarding your referral to rheumatology and pulmonology through [facility], or you may receive a letter on your MyChart portal from our referral team with instructions for scheduling an appointment. Please let us know if you have not been contacted by anyone within two weeks.  It was a pleasure to see you today!

## 2022-05-31 NOTE — Assessment & Plan Note (Signed)
Noncompliant to CPAP.  Discussed importance to CPAP compliance.

## 2022-05-31 NOTE — Assessment & Plan Note (Signed)
Repeat lipid panel pending. 

## 2022-05-31 NOTE — Assessment & Plan Note (Addendum)
Following with pulmonology, office notes reviewed from February and March 2024.  Reviewed biopsy report from 2024. Continue prednisone 30 mg daily and CellCept 500 mg twice daily. Referral placed to [facility] pulmonology for ongoing management per patient request.

## 2022-05-31 NOTE — Assessment & Plan Note (Signed)
Immunizations UTD. Pap smear UTD. Mammogram due, orders placed. Colonoscopy due, she is aware and is coordinating with GI and pulmonology.  Discussed the importance of a healthy diet and regular exercise in order for weight loss, and to reduce the risk of further co-morbidity.  Exam stable. Labs pending.  Follow up in 1 year for repeat physical.

## 2022-05-31 NOTE — Progress Notes (Signed)
Subjective:    Patient ID: Hannah Klein, female    DOB: 1975/12/21, 47 y.o.   MRN: [id number]  HPI  Hannah Klein is a very pleasant 47 y.o. female who presents today for complete physical and follow up of chronic conditions.  Immunizations: -Tetanus: Completed in 2019  Diet: Orange.  Exercise: No regular exercise.  Eye exam: Completes annually  Dental exam: Completes semi-annually    Pap Smear: 2022 Mammogram: August 2022 Colonoscopy: Completed in 2011, due. Following with GI. Colonoscopy planned.   BP Readings from Last 3 Encounters:  05/31/22 (!) 140/84  05/17/22 (!) 140/84  04/20/22 (!) 143/84        Review of Systems  Constitutional:  Negative for unexpected weight change.  HENT:  Negative for rhinorrhea.   Respiratory:  Positive for cough and shortness of breath.   Cardiovascular:  Positive for chest pain.  Gastrointestinal:  Negative for constipation and diarrhea.  Genitourinary:  Negative for difficulty urinating.  Musculoskeletal:  Negative for arthralgias and myalgias.  Skin:  Positive for rash.  Allergic/Immunologic: Negative for environmental allergies.  Neurological:  Negative for dizziness and headaches.  Psychiatric/Behavioral:  The patient is nervous/anxious.          Past Medical History:  Diagnosis Date   Arthritis    Asthma    Environmental allergies    Esophageal dysphagia    Generalized anxiety disorder    GERD (gastroesophageal reflux disease)    History of hiatal hernia    Hx of colonic polyps    Hypertension    Migraines    MRSA (methicillin resistant Staphylococcus aureus)    Lt upper arm  approx 8 years ago   Sleep apnea     Social History   Socioeconomic History   Marital status: Single    Spouse name: Not on file   Number of children: 0   Years of education: Not on file   Highest education level: Associate degree: academic program  Occupational History   Not on file  Tobacco Use   Smoking  status: Former    Packs/day: 2.00    Years: 18.00    Additional pack years: 0.00    Total pack years: 36.00    Types: Cigarettes    Start date: 36    Quit date: 2009    Years since quitting: 15.2    Passive exposure: Current   Smokeless tobacco: Never  Vaping Use   Vaping Use: Never used  Substance and Sexual Activity   Alcohol use: No    Alcohol/week: 0.0 standard drinks of alcohol   Drug use: No   Sexual activity: Not Currently  Other Topics Concern   Not on file  Social History Narrative   Single.   Moved from [place].   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   Social Determinants of Health   Financial Resource Strain: Patient Declined (05/30/2022)   Overall Financial Resource Strain (CARDIA)    Difficulty of Paying Living Expenses: Patient declined  Food Insecurity: No Food Insecurity (05/30/2022)   Hunger Vital Sign    Worried About Running Out of Food in the Last Year: Never true    Ran Out of Food in the Last Year: Never true  Transportation Needs: No Transportation Needs (05/30/2022)   PRAPARE - Hydrologist (Medical): No    Lack of Transportation (Non-Medical): No  Physical Activity: Unknown (05/30/2022)   Exercise Vital Sign    Days of Exercise per Week:  0 days    Minutes of Exercise per Session: Not on file  Stress: No Stress Concern Present (05/30/2022)   [place]    Feeling of Stress : Only a little  Social Connections: Unknown (05/30/2022)   Social Connection and Isolation Panel [NHANES]    Frequency of Communication with Friends and Family: More than three times a week    Frequency of Social Gatherings with Friends and Family: Twice a week    Attends Religious Services: Patient declined    Marine scientist or Organizations: Not on file    Attends Archivist Meetings: Not on file    Marital Status: Never married  Intimate Partner Violence: Not At Risk  (01/26/2022)   Humiliation, Afraid, Rape, and Kick questionnaire    Fear of Current or Ex-Partner: No    Emotionally Abused: No    Physically Abused: No    Sexually Abused: No    Past Surgical History:  Procedure Laterality Date   CHOLECYSTECTOMY     DILATION AND CURETTAGE OF UTERUS     INTERCOSTAL NERVE BLOCK Right 03/08/2022   Procedure: INTERCOSTAL NERVE BLOCK;  Surgeon: [name], MD;  Location: [facility];  Service: Thoracic;  Laterality: Right;   NECK SURGERY     Fusion to C6 and C7   TYMPANOSTOMY TUBE PLACEMENT     WRIST ARTHROSCOPY WITH DEBRIDEMENT Left 02/27/2018   Procedure: WRIST ARTHROSCOPY WITH DEBRIDEMENT;  Surgeon: Leanora Cover, MD;  Location: [facility];  Service: Orthopedics;  Laterality: Left;    Family History  Problem Relation Age of Onset   Eczema Mother    Allergic rhinitis Brother    Asthma Brother    Asthma Brother    Allergic rhinitis Brother    Urticaria Other    Angioedema Other     Allergies  Allergen Reactions   Advair Diskus [Fluticasone-Salmeterol] Other (See Comments)    "dries my throat out really bad'   Flonase [Fluticasone]     Nose bleeds   Sulfamethoxazole-Trimethoprim    Lipitor [Atorvastatin Calcium] Other (See Comments)    Elevations in blood pressure?    Current Outpatient Medications on File Prior to Visit  Medication Sig Dispense Refill   acetaminophen (TYLENOL) 500 MG tablet Take 2 tablets (1,000 mg total) by mouth every 6 (six) hours as needed. 30 tablet 0   albuterol (VENTOLIN HFA) 108 (90 Base) MCG/ACT inhaler INHALE 1-2 PUFFS BY MOUTH EVERY 6 HOURS AS NEEDED FOR WHEEZE OR SHORTNESS OF BREATH 18 g 1   Cholecalciferol (VITAMIN D3) 125 MCG (5000 UT) TABS Take 5,000 Units by mouth daily.     clobetasol ointment (TEMOVATE) [id number] % Apply 1 Application topically 2 (two) times daily. 30 g 0   diclofenac Sodium (VOLTAREN ARTHRITIS PAIN) 1 % GEL Apply 2 g topically daily as needed (Chest pain).     esomeprazole  (NEXIUM) 40 MG capsule Take 1 capsule (40 mg total) by mouth 2 (two) times daily. For heartburn. 180 capsule 0   famotidine (PEPCID) 40 MG tablet TAKE 1 TABLET BY MOUTH TWICE A DAY 60 tablet 5   hydrOXYzine (VISTARIL) 50 MG capsule TAKE 1 CAPSULE BY MOUTH ONCE OR TWICE DAILY AS NEEDED FOR ANXIETY 180 capsule 0   mometasone-formoterol (DULERA) 200-5 MCG/ACT AERO Inhale 2 puffs into the lungs every 12 (twelve) hours. 13 g 5   Multiple Vitamin (MULTIVITAMIN) capsule Take 1 capsule by mouth daily.  mycophenolate (CELLCEPT) 500 MG tablet Take 1 tablet (500 mg total) by mouth 2 (two) times daily. 60 tablet 2   predniSONE (DELTASONE) 10 MG tablet Take 30mg  daily 90 tablet 1   rosuvastatin (CRESTOR) 10 MG tablet TAKE 1 TABLET BY MOUTH DAILY. FOR CHOLESTEROL 90 tablet 0   sucralfate (CARAFATE) 1 g tablet Take 1 tablet (1 g total) by mouth in the morning, at noon, and at bedtime. (Patient taking differently: Take 1 g by mouth 2 (two) times daily.) 90 tablet 5   vitamin B-12 (CYANOCOBALAMIN) 1000 MCG tablet Take 1,000 mcg by mouth daily.     vitamin C (ASCORBIC ACID) 500 MG tablet Take 500 mg by mouth daily.     vitamin E 180 MG (400 UNITS) capsule Take 400 Units by mouth daily.     Azelastine HCl 137 MCG/SPRAY SOLN PLACE 2 SPRAYS INTO BOTH NOSTRILS 2 (TWO) TIMES DAILY. USE IN EACH NOSTRIL AS DIRECTED (Patient not taking: Reported on 05/31/2022) 90 mL 1   budesonide (PULMICORT) 0.25 MG/2ML nebulizer solution Take 2 mLs (0.25 mg total) by nebulization in the morning and at bedtime. (Patient not taking: Reported on 05/31/2022) 120 mL 5   formoterol (PERFOROMIST) 20 MCG/2ML nebulizer solution Take 2 mLs (20 mcg total) by nebulization 2 (two) times daily. (Patient taking differently: Take 20 mcg by nebulization daily as needed (Shortness of breath).) 120 mL 1   levocetirizine (XYZAL) 5 MG tablet Take 1 tablet (5 mg total) by mouth in the morning and at bedtime. 180 tablet 1   sulfamethoxazole-trimethoprim (BACTRIM  DS) 800-160 MG tablet Take 1 tablet by mouth 3 (three) times a week. (Patient not taking: Reported on 05/31/2022) 12 tablet 5   No current facility-administered medications on file prior to visit.    BP (!) 140/84   Pulse 85   Temp 99 F (37.2 C) (Temporal)   Ht 5\' 8"  (1.727 m)   Wt 221 lb (100.2 kg)   SpO2 97%   BMI 33.60 kg/m  Objective:   Physical Exam HENT:     Right Ear: Tympanic membrane and ear canal normal.     Left Ear: Tympanic membrane and ear canal normal.     Nose: Nose normal.  Eyes:     Conjunctiva/sclera: Conjunctivae normal.     Pupils: Pupils are equal, round, and reactive to light.  Neck:     Thyroid: No thyromegaly.  Cardiovascular:     Rate and Rhythm: Normal rate and regular rhythm.     Heart sounds: No murmur heard. Pulmonary:     Effort: Pulmonary effort is normal.     Breath sounds: Normal breath sounds. No rales.     Comments: Mild exertional dyspnea with mild exertion.  Abdominal:     General: Bowel sounds are normal.     Palpations: Abdomen is soft.     Tenderness: There is no abdominal tenderness.  Musculoskeletal:        General: Normal range of motion.     Cervical back: Neck supple.  Lymphadenopathy:     Cervical: No cervical adenopathy.  Skin:    General: Skin is warm and dry.     Findings: No rash.  Neurological:     Mental Status: She is alert and oriented to person, place, and time.     Cranial Nerves: No cranial nerve deficit.     Deep Tendon Reflexes: Reflexes are normal and symmetric.  Psychiatric:        Mood and Affect: Mood normal.  Assessment & Plan:  Hyperlipidemia, unspecified hyperlipidemia type Assessment & Plan: Repeat lipid panel pending.  Orders: -     Lipid panel -     Hemoglobin A1c  Vitamin D deficiency Assessment & Plan: Continue vitamin D3 5000 IUs daily. Repeat vitamin level pending.  Orders: -     VITAMIN D 25 Hydroxy (Vit-D Deficiency, Fractures) -     Vitamin B12  Screening  mammogram for breast cancer -     3D Screening Mammogram, Left and Right; Future  ILD (interstitial lung disease) Assessment & Plan: Following with pulmonology, office notes reviewed from February and March 2024.  Reviewed biopsy report from 2024. Continue prednisone 30 mg daily and CellCept 500 mg twice daily. Referral placed to [facility] pulmonology for ongoing management per patient request.  Orders: -     Ambulatory referral to Pulmonology  Sjogren's syndrome with lung involvement -     Ambulatory referral to Rheumatology  Essential hypertension Assessment & Plan: Above goal today, also on prior office visits with other providers. She does monitor her blood pressure at home and reports lower readings.  She will continue to monitor and notify if blood pressure readings are consistently at or above 140/90.   Allergic rhinitis due to pollen, unspecified seasonality Assessment & Plan: Following with allergist and pulmonologist.  Continue Xyzal 5 mg twice daily, hydroxyzine 50 mg twice daily as needed.   Severe persistent asthma without complication Assessment & Plan: Following with pulmonology, new referral placed to [facility] pulmonology per patient request.  She has failed multiple treatment regimens.  Continue budesonide 0.25 mg per 2 mL twice daily, Dulera 200-5 mcg 2 puffs twice daily. Reviewed pulmonology notes from February and March 2024.   OSA (obstructive sleep apnea) Assessment & Plan: Noncompliant to CPAP.  Discussed importance to CPAP compliance.   Pulmonary nodule Assessment & Plan: Reviewed CT chest from February 2024. Following with pulmonology.   Gastroesophageal reflux disease, unspecified whether esophagitis present Assessment & Plan: Following with GI.  Continue Nexium 40 mg twice daily.   Generalized anxiety disorder Assessment & Plan: Controlled thus far.  Continue hydroxyzine 50 mg twice daily as needed. Consider SSRI if warranted.  She will  update.   Preventative health care Assessment & Plan: Immunizations UTD. Pap smear UTD. Mammogram due, orders placed. Colonoscopy due, she is aware and is coordinating with GI and pulmonology.  Discussed the importance of a healthy diet and regular exercise in order for weight loss, and to reduce the risk of further co-morbidity.  Exam stable. Labs pending.  Follow up in 1 year for repeat physical.          Pleas Koch, NP

## 2022-05-31 NOTE — Assessment & Plan Note (Signed)
Controlled thus far.  Continue hydroxyzine 50 mg twice daily as needed. Consider SSRI if warranted.  She will update.

## 2022-05-31 NOTE — Assessment & Plan Note (Signed)
Above goal today, also on prior office visits with other providers. She does monitor her blood pressure at home and reports lower readings.  She will continue to monitor and notify if blood pressure readings are consistently at or above 140/90.

## 2022-05-31 NOTE — Assessment & Plan Note (Signed)
Continue vitamin D3 5000 IUs daily. Repeat vitamin level pending.

## 2022-05-31 NOTE — Telephone Encounter (Signed)
Can we order an x ray for her. Recommend taking tylenol alternating with OTC ibuprofen. We can review in detail at time of visit next week

## 2022-05-31 NOTE — Assessment & Plan Note (Signed)
Following with GI.  Continue Nexium 40 mg twice daily.

## 2022-05-31 NOTE — Assessment & Plan Note (Signed)
Following with allergist and pulmonologist.  Continue Xyzal 5 mg twice daily, hydroxyzine 50 mg twice daily as needed.

## 2022-06-04 LAB — LIPOPROTEIN A (LPA): Lipoprotein (a): 11 nmol/L (ref <75)

## 2022-06-07 NOTE — Telephone Encounter (Signed)
Can you email it to my [place] Email please?   Secure email

## 2022-06-07 NOTE — Telephone Encounter (Signed)
I was not aware at the time what form they were talking about. I am attaching the link for a form to be printed and filled out by Jae Dire. This has to be be faxed with my referral information.   [email or web address]

## 2022-06-07 NOTE — Telephone Encounter (Signed)
Please have her fill out the form and then let me know when its complete and I will have you send it back me.   Thanks!

## 2022-06-08 ENCOUNTER — Ambulatory Visit (INDEPENDENT_AMBULATORY_CARE_PROVIDER_SITE_OTHER): Payer: Commercial Managed Care - HMO | Admitting: Pulmonary Disease

## 2022-06-08 ENCOUNTER — Telehealth: Payer: Self-pay | Admitting: Pharmacy Technician

## 2022-06-08 ENCOUNTER — Encounter: Payer: Self-pay | Admitting: Pulmonary Disease

## 2022-06-08 VITALS — BP 134/72 | HR 68 | Ht 68.0 in | Wt 225.4 lb

## 2022-06-08 DIAGNOSIS — G4733 Obstructive sleep apnea (adult) (pediatric): Secondary | ICD-10-CM | POA: Diagnosis not present

## 2022-06-08 DIAGNOSIS — Z5181 Encounter for therapeutic drug level monitoring: Secondary | ICD-10-CM

## 2022-06-08 DIAGNOSIS — J849 Interstitial pulmonary disease, unspecified: Secondary | ICD-10-CM

## 2022-06-08 LAB — CBC WITH DIFFERENTIAL/PLATELET
Basophils Absolute: 0.1 10*3/uL (ref 0.0–0.1)
Basophils Relative: 0.6 % (ref 0.0–3.0)
Eosinophils Absolute: 0.1 10*3/uL (ref 0.0–0.7)
Eosinophils Relative: 0.7 % (ref 0.0–5.0)
HCT: 41.5 % (ref 36.0–46.0)
Hemoglobin: 13.9 g/dL (ref 12.0–15.0)
Lymphocytes Relative: 21.7 % (ref 12.0–46.0)
Lymphs Abs: 2.4 10*3/uL (ref 0.7–4.0)
MCHC: 33.5 g/dL (ref 30.0–36.0)
MCV: 88.2 fl (ref 78.0–100.0)
Monocytes Absolute: 1 10*3/uL (ref 0.1–1.0)
Monocytes Relative: 9.2 % (ref 3.0–12.0)
Neutro Abs: 7.5 10*3/uL (ref 1.4–7.7)
Neutrophils Relative %: 67.8 % (ref 43.0–77.0)
Platelets: 237 10*3/uL (ref 150.0–400.0)
RBC: 4.7 Mil/uL (ref 3.87–5.11)
RDW: 15.2 % (ref 11.5–15.5)
WBC: 11.1 10*3/uL — ABNORMAL HIGH (ref 4.0–10.5)

## 2022-06-08 LAB — COMPREHENSIVE METABOLIC PANEL
ALT: 18 U/L (ref 0–35)
AST: 25 U/L (ref 0–37)
Albumin: 4.2 g/dL (ref 3.5–5.2)
Alkaline Phosphatase: 59 U/L (ref 39–117)
BUN: 17 mg/dL (ref 6–23)
CO2: 30 mEq/L (ref 19–32)
Calcium: 9.4 mg/dL (ref 8.4–10.5)
Chloride: 102 mEq/L (ref 96–112)
Creatinine, Ser: 0.88 mg/dL (ref 0.40–1.20)
GFR: 77.99 mL/min (ref >60.00)
Glucose, Bld: 69 mg/dL — ABNORMAL LOW (ref 70–99)
Potassium: 3.6 mEq/L (ref 3.5–5.1)
Sodium: 140 mEq/L (ref 135–145)
Total Bilirubin: 1 mg/dL (ref 0.2–1.2)
Total Protein: 7.1 g/dL (ref 6.0–8.3)

## 2022-06-08 MED ORDER — OXYCODONE-ACETAMINOPHEN 7.5-325 MG PO TABS: 1.0000 | ORAL_TABLET | ORAL | 0 refills | Status: AC | PRN

## 2022-06-08 MED ORDER — DAPSONE 25 MG PO TABS: 50.0000 mg | ORAL_TABLET | Freq: Two times a day (BID) | ORAL | 1 refills | Status: DC

## 2022-06-08 MED ORDER — MYCOPHENOLATE MOFETIL 500 MG PO TABS: 1000.0000 mg | ORAL_TABLET | Freq: Two times a day (BID) | ORAL | 4 refills | Status: DC

## 2022-06-08 NOTE — Patient Instructions (Addendum)
Started a prescription for dapsone 50 mg twice a day for pneumonia prophylaxis Increase CellCept to 1 g twice daily Check CBC with differential, CMP today I will check on status of Rituxan and dermatology referral Reduce prednisone to 20 mg a day Check overnight oximetry I will also send in a prescription for oxycodone for pain Return to clinic in 2 to 4 weeks. He is definitely

## 2022-06-08 NOTE — Telephone Encounter (Signed)
Auth Submission: PENDING  Urgent request - case has been esclated Rep: Misty Stanley Site of care: Site of care: [facility] Payer: [employer] Medication & CPT/J Code(s) submitted: Rituxan (Rituximab) [id number] Route of submission (phone, fax, portal):  Phone # [phone] (Pharmacy, med inj) Fax # Auth type: Buy/Bill Units/visits requested: 2 doses Reference number: [id number] Approval from:  to

## 2022-06-08 NOTE — Progress Notes (Signed)
Hannah Klein    [id number]    May 31, 1975  Primary Care Physician:Clark, Keane Scrape, NP  Referring Physician: Doreene Nest, NP [address] [place],  [place] [address]  Chief complaint: Consult for interstitial lung disease  HPI: 47 y.o. who  has a past medical history of Arthritis, Asthma, Environmental allergies, Esophageal dysphagia, Generalized anxiety disorder, GERD (gastroesophageal reflux disease), History of hiatal hernia, colonic polyps, Hypertension, Migraines, MRSA (methicillin resistant Staphylococcus aureus), and Sleep apnea.   Sent to the ILD clinic by Dr. Delton Coombes for evaluation of interstitial lung disease.  She has history of severe persistent asthma for many years and is followed by Dr. Dellis Anes.  She had been on tezpire for 4 months in 2023.  Last dose of tezpire is in 2024.  In spite of the biologic she had significant dyspnea and noted to have a D-dimer.  She had a CTA which showed groundglass opacities concerning for pneumonitis/pneumonia and was admitted on 01/25/2022.  Treated with antibiotics, prednisone taper with minimal improvement.  She subsequently had surgical lung biopsy Dr. Cliffton Asters in early January 2024 with pathology showing nonspecific pneumonitis/pulmonary fibrosis.  Workup in the past shows borderline elevated rheumatoid factor and she has rheumatology evaluation pending for later this year.  Currently on 30 mg of prednisone which is started on March 29, 2022 with some improvement in symptoms  She is currently on performist and Pulmicort nebulizers.  History also notable for hiatal hernia, esophageal dysphagia and she follows with Dr. Marina Goodell.  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  No feather pillows or comforters ILD questionnaire 04/18/2022-negative Smoking history: 36-pack-year smoker.  Quit in 2009 Travel history: Originally from [place].  No significant recent travel Relevant family history: Brother does have asthma  Interim history: Started on CellCept last visit Continues on prednisone at 30 mg a day. Rituxan order has been placed  She continues to feel poorly with increasing dyspnea, pain at the right chest at site of insertion.   Outpatient Encounter Medications as of 06/08/2022  Medication Sig   acetaminophen (TYLENOL) 500 MG tablet Take 2 tablets (1,000 mg total) by mouth every 6 (six) hours as needed.   albuterol (VENTOLIN HFA) 108 (90 Base) MCG/ACT inhaler INHALE 1-2 PUFFS BY MOUTH EVERY 6 HOURS AS NEEDED FOR WHEEZE OR SHORTNESS OF BREATH   Cholecalciferol (VITAMIN D3) 125 MCG (5000 UT) TABS Take 5,000 Units by mouth daily.   clobetasol ointment (TEMOVATE) 0.05 % Apply 1 Application topically 2 (two) times daily.   diclofenac Sodium (VOLTAREN ARTHRITIS PAIN) 1 % GEL Apply 2 g topically daily as needed (Chest pain).   esomeprazole (NEXIUM) 40 MG capsule Take 1 capsule (40 mg total) by mouth 2 (two) times daily. For heartburn.   famotidine (PEPCID) 40 MG tablet TAKE 1 TABLET BY MOUTH TWICE A DAY   hydrOXYzine (VISTARIL) 50 MG capsule TAKE 1 CAPSULE BY MOUTH ONCE OR TWICE DAILY AS NEEDED FOR ANXIETY   mometasone-formoterol (DULERA) 200-5 MCG/ACT AERO Inhale 2 puffs into the lungs every 12 (twelve) hours.   Multiple Vitamin (MULTIVITAMIN) capsule Take 1 capsule by mouth daily.   mycophenolate (CELLCEPT) 500 MG tablet Take 1  tablet (500 mg total) by mouth 2 (two) times daily.   predniSONE (DELTASONE) 10 MG tablet Take 30mg  daily   rosuvastatin (CRESTOR) 10 MG tablet TAKE 1 TABLET BY MOUTH DAILY. FOR CHOLESTEROL   sucralfate (CARAFATE) 1 g tablet Take 1 tablet (1 g total) by mouth  in the morning, at noon, and at bedtime. (Patient taking differently: Take 1 g by mouth 2 (two) times daily.)   vitamin B-12 (CYANOCOBALAMIN) 1000 MCG tablet Take 1,000 mcg by mouth daily.   vitamin C (ASCORBIC ACID) 500 MG tablet Take 500 mg by mouth daily.   vitamin E 180 MG (400 UNITS) capsule Take 400 Units by mouth daily.   [DISCONTINUED] sulfamethoxazole-trimethoprim (BACTRIM DS) 800-160 MG tablet Take 1 tablet by mouth 3 (three) times a week.   formoterol (PERFOROMIST) 20 MCG/2ML nebulizer solution Take 2 mLs (20 mcg total) by nebulization 2 (two) times daily. (Patient taking differently: Take 20 mcg by nebulization daily as needed (Shortness of breath).)   levocetirizine (XYZAL) 5 MG tablet Take 1 tablet (5 mg total) by mouth in the morning and at bedtime.   [DISCONTINUED] Azelastine HCl 137 MCG/SPRAY SOLN PLACE 2 SPRAYS INTO BOTH NOSTRILS 2 (TWO) TIMES DAILY. USE IN EACH NOSTRIL AS DIRECTED (Patient not taking: Reported on 05/31/2022)   [DISCONTINUED] budesonide (PULMICORT) 0.25 MG/2ML nebulizer solution Take 2 mLs (0.25 mg total) by nebulization in the morning and at bedtime. (Patient not taking: Reported on 05/31/2022)   No facility-administered encounter medications on file as of 06/08/2022.   Physical Exam: Blood pressure 134/72, pulse 68, height 5\' 8"  (1.727 m), weight 225 lb 6.4 oz (102.2 kg), SpO2 98 %. Gen:      No acute distress HEENT:  EOMI, sclera anicteric Neck:     No masses; no thyromegaly Lungs:    Clear to auscultation bilaterally; normal respiratory effort CV:         Regular rate and rhythm; no murmurs Abd:      + bowel sounds; soft, non-tender; no palpable masses, no distension Ext:    No edema; adequate peripheral perfusion Skin:      Warm and dry; no rash Neuro: alert and oriented x 3 Psych: normal mood and affect   Data Reviewed: Imaging: High resolution CT 04/11/2020-clear lungs with 4 mm lower lobe nodule Super D CT 04/07/2021-patchy groundglass opacities in the  mid to lower lungs.  Stable lung nodules CTA 01/25/2022-no PE, moderate bilateral peripheral predominant groundglass opacities CT chest 02/22/2022-progressive interstitial lung disease with patchy groundglass, subpleural reticulation, traction bronchiectasis bilaterally with basal gradient.  Probable UIP pattern High resolution CT 04/25/2022-increasingly organized return of groundglass opacities with subpleural reticular densities, traction bronchiectasis.  Indeterminate for UIP I have reviewed the images personally.  PFTs: 03/07/2022 FVC 1.77 [41%], FEV1 1.60 [47%], F/F91, DLCO 7.03 [28%] Severe restrictive physiology.  Severe diffusion impairment.  Labs: Rheumatoid factor 01/25/2022- 41 ANA 01/25/2022-negative CCP 01/26/2022 less than 1 ANCA 08/03/2021-negative  CTD serologies 04/18/2022 ANA 1: 1280 ENA RNP 1.2, SSA greater than 200  Surgical lung biopsy 03/08/2022 No unclassifiable interstitial, chronic fibroinflammatory process.  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  Suspect she has Sjogren syndrome as SSA is markedly elevated along with ANA and she has symptoms of dry mouth and dry eyes.  We have referred her to rheumatology  Due to rapid progression on CT scan and pathology  findings with fibroblastic foci we felt that the phenotype is aggressive and recommended aggressive immunotherapy and addition of antifibrotic therapy.  Started CellCept 100 mg twice daily.  Check baseline labs today and increase dose to 1 g twice daily Reduce prednisone to 20 g a day as she is having side effects at a higher dose.  Start dapsone for pneumocystis prophylaxis.  She has side effects of flushing from Bactrim.  Also ordered Rituxan injections there are recent reports that Rituxan plus CellCept shows improvement in  connective tissue disease related ILD (European Respiratory Journal 2023; DOI: 10.1183/13993003.02071-2022).   I will check if can be seen sooner rheumatology at [facility].  Also been referred as a second opinion to both rheumatology and pulmonary at [facility].  Severe persistent asthma Off tezspire since November 2024 Continue Dulera.  She prefers the inhaler over to performist and Pulmicort nebulizer  OSA She will restart using her CPAP.  Check overnight oximetry on CPAP. Did not desat on exertion today.  Plan/Recommendations: Check labs for monitoring Increase CellCept dose to 1 g twice daily Rituxan approval is pending Reduce prednisone to 20 mg/day Rheumatology, pulmonary second opinion acute  Chilton Greathouse MD [facility] 06/08/2022, 8:39 AM  CC: Doreene Nest, NP

## 2022-06-12 ENCOUNTER — Other Ambulatory Visit: Payer: Self-pay | Admitting: Pulmonary Disease

## 2022-06-13 ENCOUNTER — Ambulatory Visit: Payer: Commercial Managed Care - HMO | Attending: Rheumatology | Admitting: Internal Medicine

## 2022-06-13 ENCOUNTER — Encounter: Payer: Self-pay | Admitting: Pulmonary Disease

## 2022-06-13 ENCOUNTER — Encounter: Payer: Self-pay | Admitting: Internal Medicine

## 2022-06-13 VITALS — BP 146/91 | HR 76 | Resp 20 | Ht 68.0 in | Wt 221.0 lb

## 2022-06-13 DIAGNOSIS — J849 Interstitial pulmonary disease, unspecified: Secondary | ICD-10-CM | POA: Diagnosis not present

## 2022-06-13 DIAGNOSIS — M3502 Sicca syndrome with lung involvement: Secondary | ICD-10-CM

## 2022-06-13 DIAGNOSIS — Z79899 Other long term (current) drug therapy: Secondary | ICD-10-CM

## 2022-06-13 NOTE — Telephone Encounter (Signed)
Devki, Thanks. Berkley Harvey has been resubmitted with guidelines. I will f/u once I have a response from insurance.

## 2022-06-13 NOTE — Telephone Encounter (Signed)
Patient had OV today with Dr. Dimple Casey and stated she received email on Sunday stating RITUXAN has been denied. Denial letter forwarded to me and I have uploaded into media tab.  Patient will also need to try other biosimilars (Ruxience, Riabni, Truxima). I have changed the infusion order to RUXIENCE. Please be advised, that rheumatology and pulmnology notes as well as EULAR guidelines for Sjogren's syndrome should be submitted for authorization as she has diagnosed Sjogren's syndrome with comorbid ILD.  Dose:  at Day 0 and Day 14. Repeat every 6 months. Premeds: acetaminophen  and diphenhydramine  and Solumedrol   Chesley Mires, PharmD, MPH, BCPS, CPP Clinical Pharmacist (Rheumatology and Pulmonology)

## 2022-06-13 NOTE — Progress Notes (Signed)
Office Visit Note  [name]             Date of Birth: January 17, 1976           MRN: [id number]             PCP: Doreene Nest, NP Referring: Leslye Peer, MD Visit Date: 06/13/2022  Subjective:  New Patient (Initial Visit) (Patient states she has severe coughing fits. Patient states she gets shaky. Patient states she gets shortness of breathe, heart pounding, and chest pain. Patient states she did get a lung biopsy.)   History of Present Illness: Hannah Klein is a 47 y.o. female here for evaluation of suspected Sjogren syndrome or other CTD associated with ILD.  She has a longstanding history of persistent asthma she has been on multiple therapies but developed worsening shortness of breath issues since around November last year.  She had started on tezspire asthma treatment for 4 months prior to this due to active symptoms.  Worsening dyspnea prompted evaluation at the emergency department due to a high D-dimer and she had CTA imaging which was negative for blood clots but demonstrated groundglass opacities and is was admitted in December.  She was treated with antibiotics and prednisone taper without significant resolution of symptoms.  She had surgical lung biopsy by Dr. Cliffton Asters in January with pathology showing nonspecific inflammation and pulmonary fibrosis.  Initial steroid course in January and then resumed again at 30 mg dose in March.  Symptoms are partially improved still having coughing and a lot of complaints about chest pain and sometimes left side neck pain.  She had previous ENT evaluation for cough which was attributed to irritation either upper airway cough syndrome or laryngal esophageal reflux.  She has a positive rheumatoid factor and antibody workup with Dr. 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  She started on mycophenolate and now increased to taking 1000 mg twice daily since last week.  So far tolerating this medication fine. She  has longstanding dry eyes and mouth symptoms.  Had attributed this to her allergies and medications more in the past and just treated supportively.  Has noticed face and forearm rashes and sun sensitivity that are more of a new or increased problem recently.  She has noticed fingernail changes with increased brittleness no visible discoloration or pitting.  Has joint pains and stiffness in multiple areas not associated with swelling visible erythema or warmth.  Recently she is noticing swelling in her face and hands and unintentional weight gain.  Also more jittery movements and positional tremor.  These have been more pronounced since starting on the prednisone. Does not notice salivary gland or lymph node swelling.  No Raynaud's symptoms.  Labs reviewed 03/2022 ANA 1:1280 cytoplasmic SSA >8.0 RNP 1.2 MyoMarker SSA 52 kDa >200 RF 25 CCP neg CK 104 Hepatitis panel neg HIV neg  Activities of Daily Living:  Patient reports morning stiffness for 0 minute.   Patient Reports nocturnal pain.  Difficulty dressing/grooming: Reports Difficulty climbing stairs: Reports Difficulty getting out of chair: Denies Difficulty using hands for taps, buttons, cutlery, and/or writing: Denies  Review of Systems  Constitutional:  Positive for fatigue.  HENT:  Positive for mouth dryness. Negative for mouth sores.   Eyes:  Positive for dryness.  Respiratory:  Positive for shortness of breath.   Cardiovascular:  Positive for chest pain. Negative for palpitations.  Gastrointestinal:  Negative for blood in stool, constipation and diarrhea.  Endocrine:  Positive for increased urination.  Genitourinary:  Positive for involuntary urination.  Musculoskeletal:  Positive for joint pain, joint pain, joint swelling, myalgias, muscle weakness, muscle tenderness and myalgias. Negative for gait problem and morning stiffness.  Skin:  Positive for color change, rash and hair loss. Negative for sensitivity to sunlight.   Allergic/Immunologic: Negative for susceptible to infections.  Neurological:  Positive for dizziness and headaches.  Hematological:  Negative for swollen glands.  Psychiatric/Behavioral:  Positive for sleep disturbance. Negative for depressed mood. The patient is nervous/anxious.     PMFS History:  Patient Active Problem List   Diagnosis Date Noted   Sjogren syndrome with lung involvement 06/13/2022   High risk medication use 06/13/2022   ILD (interstitial lung disease) 03/08/2022   S/P robot-assisted surgical procedure 03/08/2022   Pneumonitis 01/26/2022   Perennial allergic rhinitis 09/21/2021   Neck pain 08/31/2021   Allergic rhinitis due to pollen 08/31/2021   Moderate persistent asthma, uncomplicated 08/04/2021   Vitamin D deficiency 06/09/2021   BPV (benign positional vertigo), right 04/28/2021   Right tennis elbow 04/28/2021   Upper airway cough syndrome 09/28/2020   Pulmonary nodule 04/29/2020   Dyspnea 04/04/2020   Chronic rhinitis 04/04/2020   OSA (obstructive sleep apnea) 04/04/2020   Essential hypertension 11/11/2017   Hyperlipidemia 11/29/2016   Preventative health care 11/23/2015   Constipation 11/23/2015   Generalized anxiety disorder 04/26/2015   Right-sided low back pain with right-sided sciatica 04/26/2015   Asthma 04/26/2015   Frequent headaches 04/26/2015   Osteoarthritis 04/26/2015   GERD (gastroesophageal reflux disease) 04/26/2015    Past Medical History:  Diagnosis Date   Arthritis    Asthma    Environmental allergies    Esophageal dysphagia    Generalized anxiety disorder    GERD (gastroesophageal reflux disease)    History of hiatal hernia    Hx of colonic polyps    Hypertension    Lung disorder    Patient states she has two unidentified ILD   Migraines    MRSA (methicillin resistant Staphylococcus aureus)    Lt upper arm  approx 8 years ago   Sleep apnea     Family History  Problem Relation Age of Onset   Eczema Mother    Allergic  rhinitis Brother    Asthma Brother    Asthma Brother    Allergic rhinitis Brother    Urticaria Other    Angioedema Other    Past Surgical History:  Procedure Laterality Date   CHOLECYSTECTOMY     DILATION AND CURETTAGE OF UTERUS     INTERCOSTAL NERVE BLOCK Right 03/08/2022   Procedure: INTERCOSTAL NERVE BLOCK;  Surgeon: Corliss Skains, MD;  Location: [facility] OR;  Service: Thoracic;  Laterality: Right;   LUNG BIOPSY  02/2022   NECK SURGERY     Fusion to C6 and C7   TYMPANOSTOMY TUBE PLACEMENT     WRIST ARTHROSCOPY WITH DEBRIDEMENT Left 02/27/2018   Procedure: WRIST ARTHROSCOPY WITH DEBRIDEMENT;  Surgeon: Betha Loa, MD;  Location: [facility];  Service: Orthopedics;  Laterality: Left;   Social History   Social History Narrative   Single.   Moved from [place].   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   Immunization History  Administered Date(s) Administered   Influenza-Unspecified 05/26/2017   Tdap 01/09/2018     Objective: Vital Signs: BP (!) 146/91 (BP Location: Right Arm, Patient Position: Sitting, Cuff Size: Normal)   Pulse 76   Resp 20   Ht 5\' 8"  (1.727  m)   Wt 221 lb (100.2 kg)   LMP 06/03/2022   BMI 33.60 kg/m    Physical Exam Constitutional:      Appearance: She is obese.  HENT:     Mouth/Throat:     Mouth: Mucous membranes are moist.     Pharynx: Oropharynx is clear.  Eyes:     Conjunctiva/sclera: Conjunctivae normal.  Cardiovascular:     Rate and Rhythm: Normal rate and regular rhythm.  Pulmonary:     Effort: Pulmonary effort is normal.     Breath sounds: Normal breath sounds.  Musculoskeletal:     Right lower leg: No edema.     Left lower leg: No edema.  Skin:    Findings: Rash present.     Comments: Central facial erythema no papules or telangiectasias Nonspecific nailfold capillary changes, no pitting  Neurological:     General: No focal deficit present.     Mental Status: She is alert.     Motor: No weakness.     Comments: Fine  positional tremor  Psychiatric:        Mood and Affect: Mood normal.      Musculoskeletal Exam: Neck tenderness on left of midline above clavicle, no palpable nodules Shoulders full ROM no tenderness or swelling Elbows full ROM no tenderness or swelling Wrists full ROM no tenderness or swelling Fingers full ROM no tenderness or swelling Knees full ROM no tenderness or swelling  Limited ultrasound of parotid gland with one right sided cyst <1cm diameter otherwise unremarkable   Investigation: No additional findings.  Imaging: DG Chest 2 View  Result Date: 05/31/2022 CLINICAL DATA:  Right chest pain, post biopsy. EXAM: CHEST - 2 VIEW COMPARISON:  CT examination dated April 24, 2018 FINDINGS: The heart size and mediastinal contours are within normal limits. Bilateral peripheral and basilar reticular opacities consistent with history of interstitial lung disease. No appreciable pleural effusion or pneumothorax. No acute osseous abnormality. IMPRESSION: Bilateral peripheral and basilar reticular opacities consistent with history of interstitial lung disease. No pneumothorax. Electronically Signed   By: Larose Hires D.O.   On: 05/31/2022 10:16    Recent Labs: Lab Results  Component Value Date   WBC 11.1 (H) 06/08/2022   HGB 13.9 06/08/2022   PLT 237.0 06/08/2022   NA 140 06/08/2022   K 3.6 06/08/2022   CL 102 06/08/2022   CO2 30 06/08/2022   GLUCOSE 69 (L) 06/08/2022   BUN 17 06/08/2022   CREATININE 0.88 06/08/2022   BILITOT 1.0 06/08/2022   ALKPHOS 59 06/08/2022   AST 25 06/08/2022   ALT 18 06/08/2022   PROT 7.1 06/08/2022   ALBUMIN 4.2 06/08/2022   CALCIUM 9.4 06/08/2022   GFRAA >60 02/20/2018   QFTBGOLDPLUS INDETERMINATE (A) 05/17/2022    Speciality Comments: No specialty comments available.  Procedures:  No procedures performed Allergies: Advair diskus [fluticasone-salmeterol], Flonase [fluticasone], Sulfamethoxazole-trimethoprim, and Lipitor [atorvastatin  calcium]   Assessment / Plan:     Visit Diagnoses: Sjogren syndrome with lung involvement - Plan: IgG, IgA, IgM, C3 and C4, Sedimentation rate  Agree clinical picture is consistent with probable Sjogren syndrome or otherwise undifferentiated connective tissue disease associated with her ILD.  Suspect the rheumatoid factor is associated with active Sjogren syndrome and not a separate process.  Will check quantitative immunoglobulins serum complement and sedimentation rate for other markers of disease activity but may be normalized on current level of steroid treatment.  Agree with rituximab treatment if there is aggressive lung involvement would  also be beneficial for glandular involvement and other systemic symptoms.  ILD (interstitial lung disease)  Has ongoing follow-up with Dr. Isaiah Serge and agree with treatment plan as above.  I believe the chest wall pain is currently related mostly to her coughing with some intercostal muscle strain or possibly costochondritis, I do not think it indicates pleurisy or particularly related with the previous biopsy.  High risk medication use  Baseline labs reviewed look fine for the mycophenolate and plan to start rituximab.  Discussed risk of medication including cytopenias, infection risk, impact on vaccination efficacy, and infusion reactions.  I believe she is currently having some weight gain swelling and some positional tremor exacerbated by the moderate dose prednisone but no severe complications.  Orders: Orders Placed This Encounter  Procedures   IgG, IgA, IgM   C3 and C4   Sedimentation rate   No orders of the defined types were placed in this encounter.    Follow-Up Instructions: Return in about 3 months (around 09/12/2022) for New pt SjS-ILD on MMF/RTX start f/u 3mos.   Fuller Plan, MD  Note - This record has been created using [employer].  Chart creation errors have been sought, but may not always  have been located. Such  creation errors do not reflect on  the standard of medical care.

## 2022-06-14 ENCOUNTER — Encounter: Payer: Self-pay | Admitting: Pulmonary Disease

## 2022-06-14 LAB — IGG, IGA, IGM
IgG (Immunoglobin G), Serum: 1355 mg/dL (ref 600–1640)
IgM, Serum: 294 mg/dL (ref 50–300)
Immunoglobulin A: 146 mg/dL (ref 47–310)

## 2022-06-14 LAB — C3 AND C4
C3 Complement: 146 mg/dL (ref 83–193)
C4 Complement: 17 mg/dL (ref 15–57)

## 2022-06-14 LAB — SEDIMENTATION RATE: Sed Rate: 14 mm/h (ref 0–20)

## 2022-06-14 NOTE — Telephone Encounter (Signed)
Dr. Isaiah Serge, please advise on pt's message regarding getting a handicap placard. Thanks.

## 2022-06-15 ENCOUNTER — Other Ambulatory Visit: Payer: Self-pay | Admitting: Pulmonary Disease

## 2022-06-18 NOTE — Progress Notes (Signed)
Lab results look fine, no concerning problem with the sjogren syndrome or for safety with taking rituximab.

## 2022-06-18 NOTE — Telephone Encounter (Signed)
Denial for Ruxience received. Will work on appeal  Chesley Mires, PharmD, MPH, BCPS, CPP Clinical Pharmacist (Rheumatology and Pulmonology)

## 2022-06-19 ENCOUNTER — Encounter: Payer: Self-pay | Admitting: Internal Medicine

## 2022-06-20 NOTE — Telephone Encounter (Signed)
Form printed for signature and taken to Dr. Isaiah Serge.

## 2022-06-20 NOTE — Telephone Encounter (Signed)
Ok for [employer]

## 2022-06-21 NOTE — Telephone Encounter (Signed)
Patient came to the office and picked up form.  Copy sent to scan. Nothing further needed.

## 2022-06-22 ENCOUNTER — Encounter: Payer: Commercial Managed Care - HMO | Admitting: Primary Care

## 2022-06-25 ENCOUNTER — Encounter: Payer: Self-pay | Admitting: Pulmonary Disease

## 2022-06-26 NOTE — Telephone Encounter (Signed)
Submitted an EXPEDITED appeal to [employer] for  RUXIENCE . If accepted, turnaround time is 72 hours. If de-escalated to level 1 appeal, will be processed usually within 30 days  Reference # [id number] Phone: [phone] Fax: [phone]

## 2022-06-27 ENCOUNTER — Other Ambulatory Visit: Payer: Self-pay | Admitting: *Deleted

## 2022-06-27 ENCOUNTER — Encounter: Payer: Self-pay | Admitting: Pulmonary Disease

## 2022-06-27 ENCOUNTER — Ambulatory Visit
Admission: RE | Admit: 2022-06-27 | Discharge: 2022-06-27 | Disposition: A | Discharge status: Home / Self Care | Payer: Commercial Managed Care - HMO | Source: Ambulatory Visit | Attending: Primary Care | Admitting: Primary Care

## 2022-06-27 DIAGNOSIS — Z1231 Encounter for screening mammogram for malignant neoplasm of breast: Secondary | ICD-10-CM

## 2022-06-27 DIAGNOSIS — J849 Interstitial pulmonary disease, unspecified: Secondary | ICD-10-CM

## 2022-06-27 DIAGNOSIS — G4733 Obstructive sleep apnea (adult) (pediatric): Secondary | ICD-10-CM

## 2022-06-27 NOTE — Progress Notes (Signed)
Overnight oximetry on CPAP, room air dated 06/20/2022 Duration of study 8 hours 40 minutes Time spent less than 88% - 2 hours, 50 minutes Nadir O2 sat of 74%  Please let patient know that her oxygen levels were low at nighttime Order supplemental oxygen 2 L during the daytime and 3 L with her CPAP machine.    Chilton Greathouse MD [facility] 06/27/2022, 4:57 PM

## 2022-06-27 NOTE — Telephone Encounter (Signed)
Patient called and given ONO results.  Understanding stated.  DME order placed.  Nothing further at this time.   ONO results per Dr. Isaiah Serge-   Overnight oximetry on CPAP, room air dated 06/20/2022 Duration of study 8 hours 40 minutes Time spent less than 88% - 2 hours, 50 minutes Nadir O2 sat of 74%   Please let patient know that her oxygen levels were low at nighttime Order supplemental oxygen 2 L during the daytime and 3 L with her CPAP machine.

## 2022-06-28 NOTE — Progress Notes (Signed)
See telephone encounter 06/27/22.  ONO results called to patient 06/27/22.   DME order placed.  PCC's were made aware this morning of order since DME order was placed at 5pm 06/27/22.  Nothing further at this time.

## 2022-07-01 ENCOUNTER — Other Ambulatory Visit: Payer: Self-pay | Admitting: Primary Care

## 2022-07-01 DIAGNOSIS — J4540 Moderate persistent asthma, uncomplicated: Secondary | ICD-10-CM

## 2022-07-01 NOTE — Telephone Encounter (Signed)
Please call patient:  Received refill request for Xyzal allergy medication, but it looks like an older one. This was is written for 1 tablet once daily, but I think she's taking it BID. Let me know and I'll fix.

## 2022-07-02 NOTE — Telephone Encounter (Signed)
Hannah Klein, [place]; [place], [place]; [name] We Would need it like the below:  Example : Room air at rest: [] % Room air while ambulating:[] % Recovering O2 [] lpm:[] %  unfortunately it doesn't look like she de sat under 88,which would qualify at pt.  Please advise.  Thanks!   [facility] needing sats

## 2022-07-02 NOTE — Telephone Encounter (Signed)
Patient states she is taking it once daily right now due to insurance not wanting to cover it BID.  She wants you to send in for BID and [name] complete the authorization if required.

## 2022-07-02 NOTE — Telephone Encounter (Signed)
Noted. Follows with allergist who typically fills for BID. Refills provided for BID dosing.

## 2022-07-02 NOTE — Telephone Encounter (Signed)
The 02 order was just placed on 06/27/22 and confirmed by [facility] on 06/28/22 02 with Cpap

## 2022-07-02 NOTE — Telephone Encounter (Signed)
Patient has OV 07/06/22 and can be walked to check ambulatory sats at that time, but ONO results and recommendations from Dr. Isaiah Serge was O2 3L with her cpap.  Can [facility] start patients nightly O2?  Message routed to [facility]

## 2022-07-03 ENCOUNTER — Telehealth: Payer: Self-pay | Admitting: Pulmonary Disease

## 2022-07-03 NOTE — Telephone Encounter (Signed)
Hannah Klein states Rusiente is not covered. Hannah Klein phone number is [phone].

## 2022-07-04 NOTE — Telephone Encounter (Signed)
Called [employer] for update on Ruxience appeal. Per rep, the appeal is still open. Per rep, they cannot provide a turnaround time and cannot even tell me if's being processed as expedited or not.   Reference # [id number] Authorization: [id number] Call ref # [id number] Phone: [phone] Fax: [phone]  Chesley Mires, PharmD, MPH, BCPS, CPP Clinical Pharmacist (Rheumatology and Pulmonology)

## 2022-07-05 ENCOUNTER — Encounter: Payer: Self-pay | Admitting: Internal Medicine

## 2022-07-05 NOTE — Telephone Encounter (Signed)
Please advise 

## 2022-07-06 ENCOUNTER — Other Ambulatory Visit (HOSPITAL_COMMUNITY): Payer: Self-pay

## 2022-07-06 ENCOUNTER — Encounter: Payer: Self-pay | Admitting: Pulmonary Disease

## 2022-07-06 ENCOUNTER — Telehealth: Payer: Self-pay | Admitting: Pharmacist

## 2022-07-06 ENCOUNTER — Telehealth: Payer: Self-pay | Admitting: Pulmonary Disease

## 2022-07-06 ENCOUNTER — Ambulatory Visit (INDEPENDENT_AMBULATORY_CARE_PROVIDER_SITE_OTHER): Payer: Commercial Managed Care - HMO | Admitting: Pulmonary Disease

## 2022-07-06 VITALS — BP 142/80 | HR 87 | Ht 69.0 in | Wt 225.0 lb

## 2022-07-06 DIAGNOSIS — Z5181 Encounter for therapeutic drug level monitoring: Secondary | ICD-10-CM | POA: Diagnosis not present

## 2022-07-06 DIAGNOSIS — J849 Interstitial pulmonary disease, unspecified: Secondary | ICD-10-CM

## 2022-07-06 DIAGNOSIS — G4733 Obstructive sleep apnea (adult) (pediatric): Secondary | ICD-10-CM

## 2022-07-06 LAB — COMPREHENSIVE METABOLIC PANEL
ALT: 23 U/L (ref 0–35)
AST: 26 U/L (ref 0–37)
Albumin: 4.4 g/dL (ref 3.5–5.2)
Alkaline Phosphatase: 48 U/L (ref 39–117)
BUN: 10 mg/dL (ref 6–23)
CO2: 29 mEq/L (ref 19–32)
Calcium: 9.4 mg/dL (ref 8.4–10.5)
Chloride: 102 mEq/L (ref 96–112)
Creatinine, Ser: 0.8 mg/dL (ref 0.40–1.20)
GFR: 87.4 mL/min (ref >60.00)
Glucose, Bld: 80 mg/dL (ref 70–99)
Potassium: 4.3 mEq/L (ref 3.5–5.1)
Sodium: 140 mEq/L (ref 135–145)
Total Bilirubin: 1.3 mg/dL — ABNORMAL HIGH (ref 0.2–1.2)
Total Protein: 7.3 g/dL (ref 6.0–8.3)

## 2022-07-06 LAB — CBC WITH DIFFERENTIAL/PLATELET
Basophils Absolute: 0 10*3/uL (ref 0.0–0.1)
Basophils Relative: 0.5 % (ref 0.0–3.0)
Eosinophils Absolute: 0 10*3/uL (ref 0.0–0.7)
Eosinophils Relative: 0.5 % (ref 0.0–5.0)
HCT: 39.6 % (ref 36.0–46.0)
Hemoglobin: 12.9 g/dL (ref 12.0–15.0)
Lymphocytes Relative: 19.2 % (ref 12.0–46.0)
Lymphs Abs: 1.8 10*3/uL (ref 0.7–4.0)
MCHC: 32.6 g/dL (ref 30.0–36.0)
MCV: 92.8 fl (ref 78.0–100.0)
Monocytes Absolute: 0.8 10*3/uL (ref 0.1–1.0)
Monocytes Relative: 8.9 % (ref 3.0–12.0)
Neutro Abs: 6.5 10*3/uL (ref 1.4–7.7)
Neutrophils Relative %: 70.9 % (ref 43.0–77.0)
Platelets: 192 10*3/uL (ref 150.0–400.0)
RBC: 4.27 Mil/uL (ref 3.87–5.11)
RDW: 14.7 % (ref 11.5–15.5)
WBC: 9.1 10*3/uL (ref 4.0–10.5)

## 2022-07-06 MED ORDER — MYCOPHENOLATE MOFETIL 500 MG PO TABS: 1500.0000 mg | ORAL_TABLET | Freq: Two times a day (BID) | ORAL | 3 refills | Status: DC

## 2022-07-06 NOTE — Telephone Encounter (Signed)
Received new start paperwork for Ofev. Patient is commercially insured so if approved through insurance would be eligible fro copay card through [employer]  Submitted a Prior Authorization request to [employer] for OFEV via CoverMyMeds. Will update once we receive a response.  Key: [id number]  [employer] application placed in PAP pending info folder in pharmacy office  Chesley Mires, PharmD, MPH, BCPS, CPP Clinical Pharmacist (Rheumatology and Pulmonology)

## 2022-07-06 NOTE — Progress Notes (Signed)
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 who  has a past medical history of Arthritis, Asthma, Environmental allergies, Esophageal dysphagia, Generalized anxiety disorder, GERD (gastroesophageal reflux disease), History of hiatal hernia, colonic polyps, Hypertension, Lung disorder, Migraines, MRSA (methicillin resistant Staphylococcus aureus), and Sleep apnea.   Sent to the ILD clinic by Dr. Delton Coombes for evaluation of interstitial lung disease.  She has history of severe persistent asthma for many years and is followed by Dr. Dellis Anes.  She had been on tezpire for 4 months in 2023.  Last dose of tezpire is in 2024.  In spite of the biologic she had significant dyspnea and noted to have a D-dimer.  She had a CTA which showed groundglass opacities concerning for pneumonitis/pneumonia and was admitted on 01/25/2022.  Treated with antibiotics, prednisone taper with minimal improvement.  She subsequently had surgical lung biopsy Dr. Cliffton Asters in early January 2024 with pathology showing nonspecific pneumonitis/pulmonary fibrosis.  Workup in the past shows borderline elevated rheumatoid factor and she has rheumatology evaluation pending for later this year.  Currently on 30 mg of prednisone which is started on March 29, 2022 with some improvement in symptoms  She is currently on performist and Pulmicort nebulizers.  History also notable for hiatal hernia, esophageal dysphagia and she follows with Dr. Marina Goodell.  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  No feather pillows or comforters ILD questionnaire 04/18/2022-negative Smoking history: 36-pack-year smoker.  Quit in 2009 Travel history: Originally from [place].  No significant recent travel Relevant family history: Brother does have asthma  Interim history: Continues on CellCept at 1 g/day Continues on prednisone at 20 mg a day. Rituxan has been denied in spite of multiple appeals  Consulted with Dr. Delford Field from rheumatology who feels that he has Sjogren's and possible other an unspecified connective tissue disease and agrees with the plan for immunosuppression She continues to feel poorly with increasing dyspnea, pain at the right chest at site of insertion.  In addition she has developed numbness, tingling in her arms and legs  Started on supplemental oxygen due to hypoxia.  Outpatient Encounter Medications as of 07/06/2022  Medication Sig   acetaminophen (TYLENOL) 500 MG tablet Take 2 tablets (1,000 mg total) by mouth every 6 (six) hours as needed.   albuterol (VENTOLIN HFA) 108 (90 Base) MCG/ACT inhaler INHALE 1-2 PUFFS BY MOUTH EVERY 6 HOURS AS NEEDED FOR WHEEZE OR SHORTNESS OF BREATH   B Complex-C (B-COMPLEX WITH VITAMIN C) tablet Take 1 tablet by mouth daily.   budesonide (PULMICORT) 0.25 MG/2ML nebulizer solution Take 0.25 mg by nebulization 2 (two) times daily.   Cholecalciferol (VITAMIN D3) 125 MCG (5000 UT) TABS Take 5,000 Units by mouth daily.   clobetasol ointment (TEMOVATE) 0.05 % Apply 1 Application topically 2 (two) times daily.   dapsone 25 MG tablet TAKE 2 TABLETS BY MOUTH 2 TIMES DAILY.  diclofenac Sodium (VOLTAREN ARTHRITIS PAIN) 1 % GEL Apply 2 g topically daily as needed (Chest pain).   esomeprazole (NEXIUM) 40 MG capsule Take 1 capsule (40 mg total) by mouth 2 (two) times  daily. For heartburn.   famotidine (PEPCID) 40 MG tablet TAKE 1 TABLET BY MOUTH TWICE A DAY   formoterol (PERFOROMIST) 20 MCG/2ML nebulizer solution Take 20 mcg by nebulization 2 (two) times daily.   hydrOXYzine (VISTARIL) 50 MG capsule TAKE 1 CAPSULE BY MOUTH ONCE OR TWICE DAILY AS NEEDED FOR ANXIETY   levocetirizine (XYZAL) 5 MG tablet Take 1 tablet (5 mg total) by mouth 2 (two) times daily. For allergies   mometasone-formoterol (DULERA) 200-5 MCG/ACT AERO Inhale 2 puffs into the lungs every 12 (twelve) hours.   Multiple Vitamin (MULTIVITAMIN) capsule Take 1 capsule by mouth daily.   mycophenolate (CELLCEPT) 500 MG tablet Take 2 tablets (1,000 mg total) by mouth 2 (two) times daily.   Omega-3 Fatty Acids (OMEGA-3 FISH OIL) 300 MG CAPS Take 1,000 mg by mouth.   predniSONE (DELTASONE) 10 MG tablet TAKE 2 TABLETS (20 mg) BY MOUTH DAILY   rosuvastatin (CRESTOR) 10 MG tablet TAKE 1 TABLET BY MOUTH DAILY. FOR CHOLESTEROL   sucralfate (CARAFATE) 1 g tablet Take 1 tablet (1 g total) by mouth in the morning, at noon, and at bedtime. (Patient taking differently: Take 1 g by mouth 2 (two) times daily.)   vitamin B-12 (CYANOCOBALAMIN) 1000 MCG tablet Take 1,000 mcg by mouth daily.   vitamin C (ASCORBIC ACID) 500 MG tablet Take 500 mg by mouth daily.   vitamin E 180 MG (400 UNITS) capsule Take 400 Units by mouth daily.   formoterol (PERFOROMIST) 20 MCG/2ML nebulizer solution Take 2 mLs (20 mcg total) by nebulization 2 (two) times daily. (Patient taking differently: Take 20 mcg by nebulization daily as needed (Shortness of breath).)   No facility-administered encounter medications on file as of 07/06/2022.   Physical Exam: Blood pressure (!) 142/80, pulse 87, height 5\' 9"  (1.753 m), weight 225 lb (102.1 kg), last menstrual period 06/03/2022, SpO2 91 %. Gen:      No acute distress HEENT:  EOMI, sclera anicteric Neck:     No masses; no thyromegaly Lungs:    Clear to auscultation bilaterally; normal  respiratory effort CV:         Regular rate and rhythm; no murmurs Abd:      + bowel sounds; soft, non-tender; no palpable masses, no distension Ext:    No edema; adequate peripheral perfusion Skin:      Warm and dry; no rash Neuro: alert and oriented x 3 Psych: normal mood and affect   Data Reviewed: Imaging: High resolution CT 04/11/2020-clear lungs with 4 mm lower lobe nodule Super D CT 04/07/2021-patchy groundglass opacities in the mid to lower lungs.  Stable lung nodules CTA 01/25/2022-no PE, moderate bilateral peripheral predominant groundglass opacities CT chest 02/22/2022-progressive interstitial lung disease with patchy groundglass, subpleural reticulation, traction bronchiectasis bilaterally with basal gradient.  Probable UIP pattern High resolution CT 04/25/2022-increasingly organized return of groundglass opacities with subpleural reticular densities, traction bronchiectasis.  Indeterminate for UIP I have reviewed the images personally.  PFTs: 03/07/2022 FVC 1.77 [41%], FEV1 1.60 [47%], F/F91, DLCO 7.03 [28%] Severe restrictive physiology.  Severe diffusion impairment.  Labs: Rheumatoid factor 01/25/2022- 41 ANA 01/25/2022-negative CCP 01/26/2022 less than 1 ANCA 08/03/2021-negative  CTD serologies 04/18/2022 ANA 1: 1280 ENA RNP 1.2, SSA greater than 200  Surgical lung biopsy 03/08/2022 No unclassifiable interstitial, chronic fibroinflammatory  process.  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  Due to rapid progression on CT scan and pathology findings with fibroblastic foci we felt that the phenotype is  aggressive and recommended aggressive immunotherapy and addition of antifibrotic therapy.  Increase CellCept dose to 1.5 g twice daily, continue prednisone at 20 mg/day Also ordered Rituxan injections there are recent reports that Rituxan plus CellCept shows improvement in connective tissue disease related ILD (European Respiratory Journal 2023; DOI: 10.1183/13993003.02071-2022).   Unfortunately Rituxan has been denied in spite of multiple appeals.  As she has aggressive disease with rapid worsening we will start on Cytoxan while we are waiting for final decision on Rituxan.  Discussed with Dr. Dimple Casey from rheumatology Start paperwork for Ofev  She already has a referral in place to [facility] pulmonary and rheumatology for second opinion.  I will also refer her for transplant evaluation at [facility]  Continue 2 L oxygen during daytime and at night with CPAP.  Order portable concentrator  Severe persistent asthma Off tezspire since November 2024 Continue Dulera.  She prefers the inhaler over to performist and Pulmicort nebulizer  OSA Continue CPAP with oxygen  Plan/Recommendations: Check labs for monitoring Increase CellCept dose to 1.5 g twice daily Start Cytoxan.  Continue prednisone 20 mg/day Start paperwork for Ofev Referred to [facility] for transplant evaluation.  Chilton Greathouse MD [facility] 07/06/2022, 10:32 AM  CC: Doreene Nest, NP

## 2022-07-06 NOTE — Telephone Encounter (Signed)
PT presented to the front with paper work for "[employer] PATIENT ASSISTANCE Program-OFEV"  I will put in the RX Box. She states she will send proof of income to her [facility]. She did not have it with her. I made her copies.

## 2022-07-06 NOTE — Patient Instructions (Addendum)
I am sorry you are not feeling well Will start you on supplemental oxygen 2 L during the daytime, order portable concentrator Check CBC, comprehensive metabolic panel Increase CellCept to 1.5 g twice daily We will work on getting Cytoxan injections started for you We will start a medication called Ofev Referral to [facility] for transplant Follow-up in 2 to 4 weeks

## 2022-07-09 ENCOUNTER — Other Ambulatory Visit: Payer: Self-pay | Admitting: Primary Care

## 2022-07-09 ENCOUNTER — Encounter: Payer: Self-pay | Admitting: Pulmonary Disease

## 2022-07-09 DIAGNOSIS — J4540 Moderate persistent asthma, uncomplicated: Secondary | ICD-10-CM

## 2022-07-09 NOTE — Telephone Encounter (Signed)
Can we do PA for twice daily dosing?

## 2022-07-10 ENCOUNTER — Encounter: Payer: Self-pay | Admitting: Pulmonary Disease

## 2022-07-10 MED ORDER — OXYCODONE-ACETAMINOPHEN 7.5-325 MG PO TABS: 1.0000 | ORAL_TABLET | Freq: Four times a day (QID) | ORAL | 0 refills | Status: DC | PRN

## 2022-07-10 NOTE — Telephone Encounter (Signed)
  I still have right lung pain and I wanted to check and see if I can get another prescription for pain medication.  I use Tylenol and Ibprophen first.  Sometimes those just done work.    Thank you    I called and spoke with the pt regarding the above mychart msg from her. She reports that her right side of her back "right where my lung is" is very painful. She says it's worse when she gets SOB and also when she turns over on her right side in the night. She says that when she pain is mild, tylenol helps, but sometimes she needs stronger med to help. She was given percocet 7.5/325 mg  # 20 tablets on 06/08/22 and says she has 2 tablets left. She is asking for this to be refilled. Please advise, thanks!  Allergies  Allergen Reactions   Advair Diskus [Fluticasone-Salmeterol] Other (See Comments)    "dries my throat out really bad'   Flonase [Fluticasone]     Nose bleeds   Sulfamethoxazole-Trimethoprim Other (See Comments)    Makes patient hot, especially at night   Lipitor [Atorvastatin Calcium] Other (See Comments)    Elevations in blood pressure?

## 2022-07-10 NOTE — Telephone Encounter (Signed)
Please submit PA for   levocetirizine (XYZAL) 5 MG tablet.  Take 1 tablet (5 mg total) by mouth 2 (two) times daily.

## 2022-07-11 NOTE — Telephone Encounter (Signed)
Received a fax regarding Prior Authorization from [employer] for OFEV. Authorization has been DENIED because patient must have FVC >40% of predicted, has fibrosis on HRCT, and clinical signs of progression  Submitted an URGENT appeal to [employer] for OFEV.  Request ID: [id number] Fax: [phone] Phone: [phone]  Chesley Mires, PharmD, MPH, BCPS, CPP Clinical Pharmacist (Rheumatology and Pulmonology)

## 2022-07-12 ENCOUNTER — Other Ambulatory Visit: Payer: Self-pay | Admitting: Hematology

## 2022-07-12 ENCOUNTER — Encounter: Payer: Self-pay | Admitting: Hematology

## 2022-07-13 ENCOUNTER — Telehealth (HOSPITAL_COMMUNITY): Payer: Self-pay

## 2022-07-13 ENCOUNTER — Telehealth: Payer: Self-pay | Admitting: Pulmonary Disease

## 2022-07-13 NOTE — Telephone Encounter (Signed)
Kelli, can you look at the PA on this?

## 2022-07-13 NOTE — Telephone Encounter (Signed)
Called and left pt a voicemail. We have received a referral for Pulmonary Rehab and was just seeing if they are interested in going through our program.

## 2022-07-13 NOTE — Telephone Encounter (Signed)
Pt returned phone call and stated that she is interested in the pulmonary rehab program. Will pass her information over to the nurse navigator for review.

## 2022-07-13 NOTE — Telephone Encounter (Signed)
Started PA, unable to submit. Received message from [facility] that drug is covered under current benefit plan. No further PA activity needed. Spoke with pharmacist, he stated he was able to push the prescription through.

## 2022-07-14 ENCOUNTER — Other Ambulatory Visit: Payer: Self-pay | Admitting: Allergy & Immunology

## 2022-07-17 ENCOUNTER — Other Ambulatory Visit (HOSPITAL_COMMUNITY): Payer: Self-pay

## 2022-07-17 NOTE — Telephone Encounter (Signed)
Apologies for the delay, no PA is needed as noted in separate encounter. Please sign off on request as Prior auth team is unable to resolve rx requests. Thanks

## 2022-07-18 ENCOUNTER — Ambulatory Visit: Payer: Commercial Managed Care - HMO

## 2022-07-19 LAB — THIOPURINE METHYLTRANSFERASE (TPMT), RBC: Thiopurine Methyltransferase, RBC: 17 nmol/hr/mL RBC

## 2022-07-27 ENCOUNTER — Encounter: Payer: Self-pay | Admitting: Pulmonary Disease

## 2022-07-27 DIAGNOSIS — Z5181 Encounter for therapeutic drug level monitoring: Secondary | ICD-10-CM

## 2022-07-27 NOTE — Telephone Encounter (Signed)
Vernona Rieger from infusion center needs to know if patient needs labwork prior to infusion. Vernona Rieger phone number is [phone].

## 2022-07-30 ENCOUNTER — Other Ambulatory Visit (INDEPENDENT_AMBULATORY_CARE_PROVIDER_SITE_OTHER): Payer: Commercial Managed Care - HMO

## 2022-07-30 ENCOUNTER — Other Ambulatory Visit: Payer: Self-pay | Admitting: Primary Care

## 2022-07-30 DIAGNOSIS — F411 Generalized anxiety disorder: Secondary | ICD-10-CM

## 2022-07-30 DIAGNOSIS — Z5181 Encounter for therapeutic drug level monitoring: Secondary | ICD-10-CM

## 2022-07-30 LAB — CBC WITH DIFFERENTIAL/PLATELET
Basophils Absolute: 0.1 10*3/uL (ref 0.0–0.1)
Basophils Relative: 0.4 % (ref 0.0–3.0)
Eosinophils Absolute: 0 10*3/uL (ref 0.0–0.7)
Eosinophils Relative: 0.1 % (ref 0.0–5.0)
HCT: 36.4 % (ref 36.0–46.0)
Hemoglobin: 11.6 g/dL — ABNORMAL LOW (ref 12.0–15.0)
Lymphocytes Relative: 6.4 % — ABNORMAL LOW (ref 12.0–46.0)
Lymphs Abs: 1.1 10*3/uL (ref 0.7–4.0)
MCHC: 31.8 g/dL (ref 30.0–36.0)
MCV: 96.2 fl (ref 78.0–100.0)
Monocytes Absolute: 0.9 10*3/uL (ref 0.1–1.0)
Monocytes Relative: 5.5 % (ref 3.0–12.0)
Neutro Abs: 14.7 10*3/uL — ABNORMAL HIGH (ref 1.4–7.7)
Neutrophils Relative %: 87.6 % — ABNORMAL HIGH (ref 43.0–77.0)
Platelets: 251 10*3/uL (ref 150.0–400.0)
RBC: 3.78 Mil/uL — ABNORMAL LOW (ref 3.87–5.11)
RDW: 15.1 % (ref 11.5–15.5)
WBC: 16.8 10*3/uL — ABNORMAL HIGH (ref 4.0–10.5)

## 2022-07-30 LAB — COMPREHENSIVE METABOLIC PANEL
ALT: 17 U/L (ref 0–35)
AST: 23 U/L (ref 0–37)
Albumin: 4.6 g/dL (ref 3.5–5.2)
Alkaline Phosphatase: 38 U/L — ABNORMAL LOW (ref 39–117)
BUN: 17 mg/dL (ref 6–23)
CO2: 30 mEq/L (ref 19–32)
Calcium: 9.6 mg/dL (ref 8.4–10.5)
Chloride: 99 mEq/L (ref 96–112)
Creatinine, Ser: 0.9 mg/dL (ref 0.40–1.20)
GFR: 75.84 mL/min (ref >60.00)
Glucose, Bld: 90 mg/dL (ref 70–99)
Potassium: 4.1 mEq/L (ref 3.5–5.1)
Sodium: 138 mEq/L (ref 135–145)
Total Bilirubin: 1.3 mg/dL — ABNORMAL HIGH (ref 0.2–1.2)
Total Protein: 7.3 g/dL (ref 6.0–8.3)

## 2022-07-30 LAB — URINALYSIS
Bilirubin Urine: NEGATIVE
Hgb urine dipstick: NEGATIVE
Ketones, ur: NEGATIVE
Leukocytes,Ua: NEGATIVE
Nitrite: NEGATIVE
Specific Gravity, Urine: 1.025 (ref 1.000–1.030)
Total Protein, Urine: NEGATIVE
Urine Glucose: NEGATIVE
Urobilinogen, UA: 0.2 (ref 0.0–1.0)
pH: 5.5 (ref 5.0–8.0)

## 2022-07-30 MED ORDER — SODIUM CHLORIDE 0.9 % IV SOLN
750.0000 mg/m2 | Freq: Once | INTRAVENOUS | Status: AC
  Administered 2022-07-31: 1680 mg via INTRAVENOUS
  Filled 2022-07-30 (×2): qty 84

## 2022-07-30 MED ORDER — DEXTROSE-SODIUM CHLORIDE 5-0.45 % IV SOLN: Freq: Once | INTRAVENOUS | Status: AC

## 2022-07-30 MED ORDER — SODIUM CHLORIDE 0.9 % IV SOLN
400.0000 mg/m2 | Freq: Once | INTRAVENOUS | Status: AC
  Administered 2022-07-31: 900 mg via INTRAVENOUS
  Filled 2022-07-30 (×2): qty 9

## 2022-07-30 MED ORDER — ONDANSETRON HCL 8 MG PO TABS
8.0000 mg | ORAL_TABLET | Freq: Once | ORAL | Status: DC
Start: 2022-07-30 — End: 2022-07-30

## 2022-07-30 MED ORDER — SODIUM CHLORIDE 0.9 % IV SOLN
10.0000 mg | Freq: Once | INTRAVENOUS | Status: AC
  Administered 2022-07-31: 10 mg via INTRAVENOUS
  Filled 2022-07-30: qty 1

## 2022-07-30 MED ORDER — ONDANSETRON 4 MG PO TBDP
8.0000 mg | ORAL_TABLET | Freq: Once | ORAL | Status: AC
  Administered 2022-07-31: 8 mg via ORAL
  Filled 2022-07-30: qty 2

## 2022-07-30 NOTE — Telephone Encounter (Signed)
Dr. Isaiah Serge could you please advise on any labs needed before infusion?

## 2022-07-30 NOTE — Telephone Encounter (Signed)
Patient checking on labs before infusion. Patient phone number is [phone].

## 2022-07-30 NOTE — Telephone Encounter (Signed)
Lab orders were placed Pt is now asking if she needs to d/c cellcept after infusion  Please advise, thanks!

## 2022-07-30 NOTE — Telephone Encounter (Signed)
Can you please order CBC with differential, CMP and UA for her to be done today?. Thanks

## 2022-07-31 ENCOUNTER — Ambulatory Visit (INDEPENDENT_AMBULATORY_CARE_PROVIDER_SITE_OTHER): Payer: Commercial Managed Care - HMO

## 2022-07-31 ENCOUNTER — Telehealth: Payer: Self-pay | Admitting: Pulmonary Disease

## 2022-07-31 VITALS — BP 146/82 | HR 79 | Temp 98.2°F | Resp 20 | Ht 69.0 in | Wt 232.6 lb

## 2022-07-31 DIAGNOSIS — J849 Interstitial pulmonary disease, unspecified: Secondary | ICD-10-CM

## 2022-07-31 MED ORDER — ONDANSETRON HCL 4 MG PO TABS: 4.0000 mg | ORAL_TABLET | Freq: Three times a day (TID) | ORAL | 0 refills | Status: DC | PRN

## 2022-07-31 NOTE — Telephone Encounter (Signed)
Patient finished Cytoxan infusion today and is requesting a prescription for Zofran as needed for any nausea she may have.  Message routed to Dr. Isaiah Serge to advise

## 2022-07-31 NOTE — Telephone Encounter (Signed)
Called and spoke patient. Patient is aware nausea medication was sent to pharmacy.  Nothing further at this time.

## 2022-07-31 NOTE — Telephone Encounter (Signed)
I have sent a prescription for Zofran to her pharmacy to help with the nausea.

## 2022-07-31 NOTE — Progress Notes (Signed)
Diagnosis: Interstitial Lung Disease  Provider:  Chilton Greathouse MD  Procedure: IV Infusion  IV Type: Peripheral, IV Location: L Hand  D 5 1/2 NS - 1610-9604  Decadron 10mg  1145-1158  [place] 900mg  5409-8119   Cytoxan 1680mg  1243-1320  [place] 900mg  1329-1400  D 5 1/2 NS 1401-1501    Post Infusion IV Care: Observation period completed  Discharge: Condition: Good, Destination: Home . AVS Provided  Performed by:  Marin Shutter, RN

## 2022-08-02 ENCOUNTER — Encounter: Payer: Self-pay | Admitting: Pulmonary Disease

## 2022-08-02 ENCOUNTER — Ambulatory Visit (INDEPENDENT_AMBULATORY_CARE_PROVIDER_SITE_OTHER): Payer: Commercial Managed Care - HMO | Admitting: Pulmonary Disease

## 2022-08-02 VITALS — BP 132/80 | HR 87 | Temp 98.6°F | Ht 69.0 in | Wt 233.4 lb

## 2022-08-02 DIAGNOSIS — J849 Interstitial pulmonary disease, unspecified: Secondary | ICD-10-CM

## 2022-08-02 DIAGNOSIS — G4733 Obstructive sleep apnea (adult) (pediatric): Secondary | ICD-10-CM | POA: Diagnosis not present

## 2022-08-02 DIAGNOSIS — Z5181 Encounter for therapeutic drug level monitoring: Secondary | ICD-10-CM | POA: Diagnosis not present

## 2022-08-02 NOTE — Progress Notes (Signed)
Hannah Klein    [id number]    03-10-1975  Primary Care Physician:Clark, Keane Scrape, NP  Referring Physician: Doreene Nest, NP [address] 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 who  has a past medical history of Arthritis, Asthma, Environmental allergies, Esophageal dysphagia, Generalized anxiety disorder, GERD (gastroesophageal reflux disease), History of hiatal hernia, colonic polyps, Hypertension, Lung disorder, Migraines, MRSA (methicillin resistant Staphylococcus aureus), and Sleep apnea.   Sent to the ILD clinic by Dr. Delton Coombes for evaluation of interstitial lung disease.  She has history of severe persistent asthma for many years and is followed by Dr. Dellis Anes.  She had been on tezpire for 4 months in 2023.  Last dose of tezpire is in 2024.  In spite of the biologic she had significant dyspnea and noted to have a D-dimer.  She had a CTA which showed groundglass opacities concerning for pneumonitis/pneumonia and was admitted on 01/25/2022.  Treated with antibiotics, prednisone taper with minimal improvement.  She subsequently had surgical lung biopsy Dr. Cliffton Asters in early January 2024 with pathology showing nonspecific pneumonitis/pulmonary fibrosis.  Workup in the past shows borderline elevated rheumatoid factor and she has rheumatology evaluation pending for later this year.  Currently on 30 mg of prednisone which is started on March 29, 2022 with some improvement in symptoms  She is currently on performist and Pulmicort nebulizers.  History also notable for hiatal hernia, esophageal dysphagia and she follows with Dr. Marina Goodell.  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 Consulted with Dr. 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  No feather pillows or comforters ILD questionnaire 04/18/2022-negative Smoking history: 36-pack-year smoker.  Quit in 2009 Travel history: Originally from [place].  No significant recent travel Relevant family history: Brother does have asthma  Interim history: Continues on CellCept at 1.5 g/day Continues on prednisone at 20 mg bid Rituxan has been denied in spite of multiple appeals Cytoxan was initiated as her ILD was felt to be rapidly progressive and she got her first dose this week  She had a second opinion at [facility] who is doing T-cell, B-cell staining on her biopsy.  If this increase in B cells then the plan is to reappear Rituxan.  Otherwise they agree with the plan  Started on supplemental oxygen due to hypoxia.  Outpatient Encounter Medications as of 08/02/2022  Medication Sig   acetaminophen (TYLENOL) 500 MG tablet Take 2 tablets (1,000 mg total) by mouth every 6 (six) hours as needed.   albuterol (VENTOLIN HFA) 108 (90 Base) MCG/ACT inhaler INHALE 1-2 PUFFS BY MOUTH EVERY 6 HOURS AS NEEDED FOR WHEEZE OR SHORTNESS OF BREATH   B Complex-C (B-COMPLEX WITH VITAMIN C) tablet Take 1 tablet by mouth daily.   budesonide (PULMICORT) 0.25 MG/2ML nebulizer solution Take 0.25 mg by nebulization 2 (two) times daily.   Cholecalciferol (VITAMIN D3) 125 MCG (5000 UT) TABS Take 5,000 Units by mouth  daily.   clobetasol ointment (TEMOVATE) 0.05 % Apply 1 Application topically 2 (two) times daily.   dapsone 25 MG tablet TAKE 2 TABLETS BY MOUTH 2 TIMES DAILY.   diclofenac Sodium (VOLTAREN ARTHRITIS PAIN) 1 % GEL  Apply 2 g topically daily as needed (Chest pain).   esomeprazole (NEXIUM) 40 MG capsule Take 1 capsule (40 mg total) by mouth 2 (two) times daily. For heartburn.   famotidine (PEPCID) 40 MG tablet TAKE 1 TABLET BY MOUTH TWICE A DAY   formoterol (PERFOROMIST) 20 MCG/2ML nebulizer solution Take 20 mcg by nebulization 2 (two) times daily.   hydrOXYzine (VISTARIL) 50 MG capsule TAKE 1 CAPSULE BY MOUTH ONCE OR TWICE DAILY AS NEEDED FOR ANXIETY   levocetirizine (XYZAL) 5 MG tablet Take 1 tablet (5 mg total) by mouth 2 (two) times daily. For allergies   mometasone-formoterol (DULERA) 200-5 MCG/ACT AERO Inhale 2 puffs into the lungs every 12 (twelve) hours.   Multiple Vitamin (MULTIVITAMIN) capsule Take 1 capsule by mouth daily.   mycophenolate (CELLCEPT) 500 MG tablet Take 3 tablets (1,500 mg total) by mouth 2 (two) times daily.   Omega-3 Fatty Acids (OMEGA-3 FISH OIL) 300 MG CAPS Take 1,000 mg by mouth.   ondansetron (ZOFRAN) 4 MG tablet Take 1 tablet (4 mg total) by mouth every 8 (eight) hours as needed for nausea or vomiting.   oxyCODONE-acetaminophen (PERCOCET) 7.5-325 MG tablet Take 1 tablet by mouth every 6 (six) hours as needed for up to 20 doses for severe pain.   predniSONE (DELTASONE) 10 MG tablet TAKE 2 TABLETS (20 mg) BY MOUTH DAILY   rosuvastatin (CRESTOR) 10 MG tablet TAKE 1 TABLET BY MOUTH DAILY. FOR CHOLESTEROL   sucralfate (CARAFATE) 1 g tablet Take 1 tablet (1 g total) by mouth in the morning, at noon, and at bedtime. (Patient taking differently: Take 1 g by mouth 2 (two) times daily.)   vitamin B-12 (CYANOCOBALAMIN) 1000 MCG tablet Take 1,000 mcg by mouth daily.   vitamin C (ASCORBIC ACID) 500 MG tablet Take 500 mg by mouth daily.   vitamin E 180 MG (400 UNITS) capsule Take 400 Units by mouth daily.   formoterol (PERFOROMIST) 20 MCG/2ML nebulizer solution Take 2 mLs (20 mcg total) by nebulization 2 (two) times daily. (Patient taking differently: Take 20 mcg by nebulization daily as  needed (Shortness of breath).)   No facility-administered encounter medications on file as of 08/02/2022.   Physical Exam: Blood pressure 132/80, pulse 87, temperature 98.6 F (37 C), temperature source Oral, height 5\' 9"  (1.753 m), weight 233 lb 6.4 oz (105.9 kg), SpO2 96 %. Gen:      No acute distress HEENT:  EOMI, sclera anicteric Neck:     No masses; no thyromegaly Lungs:    Clear to auscultation bilaterally; normal respiratory effort CV:         Regular rate and rhythm; no murmurs Abd:      + bowel sounds; soft, non-tender; no palpable masses, no distension Ext:    No edema; adequate peripheral perfusion Skin:      Warm and dry; no rash Neuro: alert and oriented x 3 Psych: normal mood and affect   Data Reviewed: Imaging: High resolution CT 04/11/2020-clear lungs with 4 mm lower lobe nodule Super D CT 04/07/2021-patchy groundglass opacities in the mid to lower lungs.  Stable lung nodules CTA 01/25/2022-no PE, moderate bilateral peripheral predominant groundglass opacities CT chest 02/22/2022-progressive interstitial lung disease with patchy groundglass, subpleural reticulation, traction bronchiectasis bilaterally with basal gradient.  Probable UIP pattern High resolution CT 04/25/2022-increasingly organized return of groundglass opacities with subpleural reticular densities, traction bronchiectasis.  Indeterminate for UIP I have reviewed the images personally.  PFTs: 03/07/2022 FVC 1.77 [41%], FEV1 1.60 [47%], F/F91, DLCO 7.03 [28%] Severe restrictive physiology.  Severe diffusion impairment.  Labs: Rheumatoid factor 01/25/2022- 41 ANA 01/25/2022-negative CCP 01/26/2022 less than 1 ANCA 08/03/2021-negative  CTD serologies 04/18/2022 ANA 1: 1280 ENA RNP 1.2, SSA greater than 200  Surgical lung biopsy 03/08/2022 No unclassifiable interstitial, chronic fibroinflammatory process.  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  Due to rapid progression on CT scan and pathology findings with fibroblastic foci we felt that the phenotype is aggressive and recommended aggressive immunotherapy and addition of antifibrotic therapy.  Continue CellCept dose to 1.5 g twice daily, continue prednisone at 20 mg/day Also ordered Rituxan injections there are recent reports that Rituxan plus CellCept shows improvement in connective tissue disease related ILD (European Respiratory Journal 2023; DOI: 10.1183/13993003.02071-2022).   Unfortunately Rituxan has been denied in spite of multiple appeals.  As she has aggressive disease with rapid worsening we have on Cytoxan while we are waiting for final decision on Rituxan. We applied for Ofev but has been denied by insurance.  This is temporarily on hold until we can get her immunosuppression optimized  When she is stabilized with immunosuppression we can get her off CellCept and attempt azathioprine instead.  TPMT levels are normal  Continue transplant evaluation at [facility] reviewed with elevation in WBC count.  She does not have any symptoms of infection and will continue to monitor this.  Continue 2 L oxygen during daytime and at night with CPAP.  Order portable concentrator  Severe persistent asthma Off tezspire since November 2024 Continue Dulera.  She prefers the inhaler over to performist and Pulmicort  nebulizer  OSA Continue CPAP with oxygen  Plan/Recommendations: Follow labs for monitoring CellCept 1.5 g twice daily Started Cytoxan.  Continue prednisone 20 mg bid [facility] is attempting to get Rituxan approved through reappeal.  Chilton Greathouse MD [facility] 08/02/2022, 2:38 PM  CC: Doreene Nest, NP

## 2022-08-02 NOTE — Patient Instructions (Signed)
I am glad you tolerated the Cytoxan well Continue all your medications as prescribed Follow-up in 1 month

## 2022-08-10 NOTE — Telephone Encounter (Signed)
Called and spoke with patient. Patient scheduled 09/03/22 at 0915 for follow up after Cytoxan infusion. Nothing further at this time.

## 2022-08-11 ENCOUNTER — Other Ambulatory Visit: Payer: Self-pay | Admitting: Primary Care

## 2022-08-11 ENCOUNTER — Other Ambulatory Visit: Payer: Self-pay | Admitting: Pulmonary Disease

## 2022-08-11 DIAGNOSIS — E785 Hyperlipidemia, unspecified: Secondary | ICD-10-CM

## 2022-08-14 ENCOUNTER — Telehealth (HOSPITAL_COMMUNITY): Payer: Self-pay

## 2022-08-14 ENCOUNTER — Ambulatory Visit (INDEPENDENT_AMBULATORY_CARE_PROVIDER_SITE_OTHER): Payer: Commercial Managed Care - HMO | Admitting: Primary Care

## 2022-08-14 ENCOUNTER — Encounter (HOSPITAL_COMMUNITY): Payer: Self-pay

## 2022-08-14 ENCOUNTER — Encounter: Payer: Self-pay | Admitting: Primary Care

## 2022-08-14 VITALS — BP 134/82 | HR 75 | Temp 96.7°F | Ht 69.0 in | Wt 233.0 lb

## 2022-08-14 DIAGNOSIS — E66811 Obesity, class 1: Secondary | ICD-10-CM | POA: Insufficient documentation

## 2022-08-14 DIAGNOSIS — K219 Gastro-esophageal reflux disease without esophagitis: Secondary | ICD-10-CM

## 2022-08-14 DIAGNOSIS — Z6836 Body mass index (BMI) 36.0-36.9, adult: Secondary | ICD-10-CM | POA: Insufficient documentation

## 2022-08-14 DIAGNOSIS — Z6834 Body mass index (BMI) 34.0-34.9, adult: Secondary | ICD-10-CM

## 2022-08-14 DIAGNOSIS — M3502 Sicca syndrome with lung involvement: Secondary | ICD-10-CM

## 2022-08-14 DIAGNOSIS — Z0184 Encounter for antibody response examination: Secondary | ICD-10-CM

## 2022-08-14 DIAGNOSIS — J849 Interstitial pulmonary disease, unspecified: Secondary | ICD-10-CM

## 2022-08-14 DIAGNOSIS — E6609 Other obesity due to excess calories: Secondary | ICD-10-CM | POA: Diagnosis not present

## 2022-08-14 MED ORDER — FAMOTIDINE 40 MG PO TABS
40.0000 mg | ORAL_TABLET | Freq: Two times a day (BID) | ORAL | 2 refills | Status: DC
Start: 2022-08-14 — End: 2023-05-01

## 2022-08-14 MED ORDER — WEGOVY 0.25 MG/0.5ML ~~LOC~~ SOAJ
SUBCUTANEOUS | 0 refills | Status: DC
Start: 2022-08-14 — End: 2022-08-17

## 2022-08-14 NOTE — Assessment & Plan Note (Signed)
Long discussion today regarding the importance of improving her diet, she is now motivated.  Continue to work on diet.  She will start Wegovy 0.25 mg weekly x 4 weeks, then increase to 0.5 mg weekly thereafter.  We discussed potential side effects, including gastroparesis/GERD/constipation.  She will notify if she experiences any of the symptoms.  We also discussed instructions for administration.  We will plan to see her back in 3 months for follow-up.

## 2022-08-14 NOTE — Telephone Encounter (Signed)
Kelli, can you get her scheduled for hepatitis B vaccines?

## 2022-08-14 NOTE — Telephone Encounter (Signed)
Called patient to see if she was interested in participating in the Pulmonary Rehab Program. Patient stated yes. Patient will come in for orientation on 6/19@9  and will attend the 1015 exercise class.   Sent package.

## 2022-08-14 NOTE — Telephone Encounter (Signed)
Pt insurance is active and benefits verified through [employer] Co-pay 0, DED $3,500/$3,500 met, out of pocket $9,450/$9,450 met, co-insurance 50%. no pre-authorization required, [place]/[employer] 08/14/22@10 :16, REF# [id number]

## 2022-08-14 NOTE — Progress Notes (Signed)
Subjective:    Patient ID: Hannah Klein, female    DOB: 05/02/1975, 47 y.o.   MRN: [id number]  HPI  Hannah Klein is a very pleasant 47 y.o. female with a history of interstitial lung disease, Sjogren's syndrome, hypertension, asthma, OSA, back pain, hyperlipidemia, dyspnea who presents today to discuss weight loss medication.  Following with pulmonology and now the transplant team at [facility] for evaluation and candidacy of lung transplant for progressing NSIP/fibrosis. She is now managed on oxygen at 4 L continuously. She was last evaluated by the transplant team at [facility] on 08/09/2022, and the main concern at this visit was her obesity with BMI goal of 30 initially then to 27-28.  It was recommended she start GLP-1 treatment and participate in pulmonary rehab.  Today she admits to difficulty losing weight due to chronic consumption of prednisone consistently since February 2024. She is also limited in exercise due to her chronic lung disease and exertional dyspnea. She is managed on oxygen continuously anywhere from 0.5-8 liters per minute.   She's not been working on weight loss this year as she "gave up". Over the last week she's changed her diet to increase vegetable and protein intake. She has little appetite overall since 07/31/22. She has more motivation to lose weight so that she can proceed with a lung transplant in the future.   She was told years ago that she had gastroparesis, but she's not had issues with this in years. She denies constipation. She is managed on Nexium 40 mg BID and famotidine 40 mg BID and feels well controlled. She is needing refills of famotidine today.   Body mass index is 34.41 kg/m.   Review of Systems  Respiratory:  Positive for shortness of breath.   Gastrointestinal:  Negative for constipation.         Past Medical History:  Diagnosis Date   Arthritis    Asthma    Environmental allergies    Esophageal dysphagia    Generalized anxiety  disorder    GERD (gastroesophageal reflux disease)    History of hiatal hernia    Hx of colonic polyps    Hypertension    Lung disorder    Patient states she has two unidentified ILD   Migraines    MRSA (methicillin resistant Staphylococcus aureus)    Lt upper arm  approx 8 years ago   Sleep apnea     Social History   Socioeconomic History   Marital status: Single    Spouse name: Not on file   Number of children: 0   Years of education: Not on file   Highest education level: Associate degree: academic program  Occupational History   Not on file  Tobacco Use   Smoking status: Former    Packs/day: 2.00    Years: 18.00    Additional pack years: 0.00    Total pack years: 36.00    Types: Cigarettes    Start date: 57    Quit date: 2009    Years since quitting: 15.4    Passive exposure: Current   Smokeless tobacco: Never  Vaping Use   Vaping Use: Never used  Substance and Sexual Activity   Alcohol use: No    Alcohol/week: 0.0 standard drinks of alcohol   Drug use: No   Sexual activity: Not Currently  Other Topics Concern   Not on file  Social History Narrative   Single.   Moved from [place].   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   Social Determinants of Health   Financial Resource Strain: Patient Declined (05/30/2022)   Overall Financial Resource Strain (CARDIA)    Difficulty of Paying Living Expenses: Patient declined  Food Insecurity: No Food Insecurity (05/30/2022)   Hunger Vital Sign    Worried About Running Out of Food in the Last Year: Never true    Ran Out of Food in the Last Year: Never true  Transportation Needs: No Transportation Needs (05/30/2022)   PRAPARE - Administrator, Civil Service (Medical): No    Lack of Transportation (Non-Medical): No  Physical Activity: Unknown (05/30/2022)   Exercise Vital Sign    Days of Exercise per Week: 0 days    Minutes of Exercise per Session: Not on file  Stress: No Stress Concern Present (05/30/2022)   [employer] of Occupational Health - Occupational Stress Questionnaire    Feeling of Stress : Only a little  Social Connections: Unknown (05/30/2022)   Social Connection and Isolation Panel [NHANES]    Frequency of Communication with Friends and Family: More than three times a week    Frequency of Social Gatherings with Friends and Family: Twice a week    Attends Religious Services: Patient declined    Database administrator or Organizations: Not on file    Attends Banker Meetings: Not on file    Marital Status: Never married  Intimate Partner Violence: Not At Risk (01/26/2022)   Humiliation, Afraid, Rape, and Kick questionnaire    Fear of Current or Ex-Partner: No    Emotionally Abused: No    Physically Abused: No    Sexually Abused: No    Past Surgical History:  Procedure Laterality Date   CHOLECYSTECTOMY     DILATION AND CURETTAGE OF UTERUS     INTERCOSTAL NERVE BLOCK Right 03/08/2022   Procedure: INTERCOSTAL NERVE BLOCK;  Surgeon: Corliss Skains, MD;  Location: [facility] OR;  Service: Thoracic;  Laterality: Right;   LUNG BIOPSY  02/2022   NECK SURGERY     Fusion to C6 and C7   TYMPANOSTOMY TUBE PLACEMENT     WRIST ARTHROSCOPY WITH DEBRIDEMENT Left 02/27/2018   Procedure: WRIST ARTHROSCOPY WITH DEBRIDEMENT;  Surgeon: Betha Loa, MD;  Location: [facility];  Service: Orthopedics;  Laterality: Left;    Family History  Problem Relation Age of Onset   Eczema Mother    Allergic rhinitis Brother    Asthma Brother    Asthma Brother    Allergic rhinitis Brother    Urticaria Other    Angioedema Other    Breast cancer Neg Hx     Allergies  Allergen Reactions   Advair Diskus [Fluticasone-Salmeterol] Other (See Comments)    "dries my throat out really bad'   Flonase [Fluticasone]     Nose bleeds   Sulfamethoxazole-Trimethoprim Other (See Comments)    Makes patient hot, especially at night   Lipitor [Atorvastatin Calcium] Other (See Comments)     Elevations in blood pressure?    Current Outpatient Medications on File Prior to Visit  Medication Sig Dispense Refill   acetaminophen (TYLENOL) 500 MG tablet Take 2 tablets (1,000 mg total) by mouth every 6 (six) hours as needed. 30 tablet 0   albuterol (VENTOLIN HFA) 108 (90 Base) MCG/ACT inhaler INHALE 1-2 PUFFS BY MOUTH EVERY 6 HOURS AS NEEDED FOR WHEEZE OR SHORTNESS OF BREATH 18 g 1   B Complex-C (B-COMPLEX WITH VITAMIN C) tablet Take 1 tablet by mouth daily.  Cholecalciferol (VITAMIN D3) 125 MCG (5000 UT) TABS Take 5,000 Units by mouth daily.     dapsone 25 MG tablet TAKE 2 TABLETS BY MOUTH 2 TIMES DAILY. 360 tablet 1   diclofenac Sodium (VOLTAREN ARTHRITIS PAIN) 1 % GEL Apply 2 g topically daily as needed (Chest pain).     esomeprazole (NEXIUM) 40 MG capsule Take 1 capsule (40 mg total) by mouth 2 (two) times daily. For heartburn. 180 capsule 0   formoterol (PERFOROMIST) 20 MCG/2ML nebulizer solution Take 20 mcg by nebulization 2 (two) times daily.     hydrOXYzine (VISTARIL) 50 MG capsule TAKE 1 CAPSULE BY MOUTH ONCE OR TWICE DAILY AS NEEDED FOR ANXIETY 180 capsule 2   levocetirizine (XYZAL) 5 MG tablet Take 1 tablet (5 mg total) by mouth 2 (two) times daily. For allergies 180 tablet 3   Multiple Vitamin (MULTIVITAMIN) capsule Take 1 capsule by mouth daily.     mycophenolate (CELLCEPT) 500 MG tablet Take 3 tablets (1,500 mg total) by mouth 2 (two) times daily. 120 tablet 3   ondansetron (ZOFRAN) 4 MG tablet Take 1 tablet (4 mg total) by mouth every 8 (eight) hours as needed for nausea or vomiting. 20 tablet 0   oxyCODONE-acetaminophen (PERCOCET) 7.5-325 MG tablet Take 1 tablet by mouth every 6 (six) hours as needed for up to 20 doses for severe pain. 20 tablet 0   predniSONE (DELTASONE) 10 MG tablet Take 2 tabs (20mg )  Twice daily 120 tablet 1   rosuvastatin (CRESTOR) 10 MG tablet TAKE 1 TABLET BY MOUTH EVERY DAY FOR CHOLESTEROL 90 tablet 2   sucralfate (CARAFATE) 1 g tablet Take 1  tablet (1 g total) by mouth in the morning, at noon, and at bedtime. (Patient taking differently: Take 1 g by mouth 2 (two) times daily.) 90 tablet 5   vitamin B-12 (CYANOCOBALAMIN) 1000 MCG tablet Take 1,000 mcg by mouth daily.     vitamin C (ASCORBIC ACID) 500 MG tablet Take 500 mg by mouth daily.     vitamin E 180 MG (400 UNITS) capsule Take 400 Units by mouth daily.     budesonide (PULMICORT) 0.25 MG/2ML nebulizer solution Take 0.25 mg by nebulization 2 (two) times daily. (Patient not taking: Reported on 08/14/2022)     clobetasol ointment (TEMOVATE) 0.05 % Apply 1 Application topically 2 (two) times daily. (Patient not taking: Reported on 08/14/2022) 30 g 0   formoterol (PERFOROMIST) 20 MCG/2ML nebulizer solution Take 2 mLs (20 mcg total) by nebulization 2 (two) times daily. (Patient taking differently: Take 20 mcg by nebulization daily as needed (Shortness of breath).) 120 mL 1   mometasone-formoterol (DULERA) 200-5 MCG/ACT AERO Inhale 2 puffs into the lungs every 12 (twelve) hours. (Patient not taking: Reported on 08/14/2022) 13 g 5   Omega-3 Fatty Acids (OMEGA-3 FISH OIL) 300 MG CAPS Take 1,000 mg by mouth. (Patient not taking: Reported on 08/14/2022)     No current facility-administered medications on file prior to visit.    BP 134/82   Pulse 75   Temp (!) 96.7 F (35.9 C) (Temporal)   Ht 5\' 9"  (1.753 m)   Wt 233 lb (105.7 kg)   LMP 07/31/2022 (Approximate)   SpO2 96% Comment: 2 L O2  BMI 34.41 kg/m  Objective:   Physical Exam Cardiovascular:     Rate and Rhythm: Normal rate and regular rhythm.  Pulmonary:     Effort: Pulmonary effort is normal.     Breath sounds: Normal breath sounds.  Musculoskeletal:  Cervical back: Neck supple.  Skin:    General: Skin is warm and dry.           Assessment & Plan:  Class 1 obesity due to excess calories with serious comorbidity and body mass index (BMI) of 34.0 to 34.9 in adult Assessment & Plan: Long discussion today regarding  the importance of improving her diet, she is now motivated.  Continue to work on diet.  She will start Wegovy 0.25 mg weekly x 4 weeks, then increase to 0.5 mg weekly thereafter.  We discussed potential side effects, including gastroparesis/GERD/constipation.  She will notify if she experiences any of the symptoms.  We also discussed instructions for administration.  We will plan to see her back in 3 months for follow-up.  Orders: -     Wegovy; Inject 0.25 mg into the skin once weekly for 4 weeks, then increase to 0.5 mg once weekly thereafter.  Dispense: 6 mL; Refill: 0  Gastroesophageal reflux disease, unspecified whether esophagitis present Assessment & Plan: Controlled.  Continue Nexium 40 mg twice daily, famotidine 40 mg twice daily.  Orders: -     Famotidine; Take 1 tablet (40 mg total) by mouth 2 (two) times daily. for heartburn.  Dispense: 180 tablet; Refill: 2        Doreene Nest, NP

## 2022-08-14 NOTE — Patient Instructions (Signed)
Start semaglutide [facility]) for weight loss. Start by injecting 0.25 mg into the skin once weekly for 4 weeks, then increase to 0.5 mg once weekly thereafter.   Continue to work on [employer].   Schedule a follow up visit for 3 months.  It was a pleasure to see you today!

## 2022-08-14 NOTE — Assessment & Plan Note (Signed)
Controlled.  Continue Nexium 40 mg twice daily, famotidine 40 mg twice daily.

## 2022-08-15 ENCOUNTER — Encounter (HOSPITAL_COMMUNITY)
Admission: RE | Admit: 2022-08-15 | Discharge: 2022-08-15 | Disposition: A | Discharge status: Home / Self Care | Payer: Commercial Managed Care - HMO | Source: Ambulatory Visit | Attending: Pulmonary Disease | Admitting: Pulmonary Disease

## 2022-08-15 ENCOUNTER — Other Ambulatory Visit: Payer: Self-pay | Admitting: *Deleted

## 2022-08-15 ENCOUNTER — Encounter (HOSPITAL_COMMUNITY): Payer: Self-pay

## 2022-08-15 ENCOUNTER — Encounter: Payer: Self-pay | Admitting: Pulmonary Disease

## 2022-08-15 VITALS — BP 104/62 | HR 87 | Ht 69.0 in | Wt 235.5 lb

## 2022-08-15 DIAGNOSIS — J849 Interstitial pulmonary disease, unspecified: Secondary | ICD-10-CM

## 2022-08-15 NOTE — Telephone Encounter (Signed)
Called and scheduled patient for Hep B vaccine series

## 2022-08-15 NOTE — Progress Notes (Signed)
Pulmonary Individual Treatment Plan  Patient Details  Name: Hannah Klein MRN: [id number] Date of Birth: 1976-02-10 Referring Provider:   Doristine Devoid Pulmonary Rehab Walk Test from 08/15/2022 in [facility]  Referring Provider Soskis  [sees [name]]       Initial Encounter Date:  Flowsheet Row Pulmonary Rehab Walk Test from 08/15/2022 in [facility]  Date 08/15/22       Visit Diagnosis: Interstitial lung disease (HCC)  Patient's Home Medications on Admission:   Current Outpatient Medications:    acetaminophen (TYLENOL) 500 MG tablet, Take 2 tablets (1,000 mg total) by mouth every 6 (six) hours as needed., Disp: 30 tablet, Rfl: 0   albuterol (VENTOLIN HFA) 108 (90 Base) MCG/ACT inhaler, INHALE 1-2 PUFFS BY MOUTH EVERY 6 HOURS AS NEEDED FOR WHEEZE OR SHORTNESS OF BREATH, Disp: 18 g, Rfl: 1   B Complex-C (B-COMPLEX WITH VITAMIN C) tablet, Take 1 tablet by mouth daily., Disp: , Rfl:    budesonide (PULMICORT) 0.25 MG/2ML nebulizer solution, Take 0.25 mg by nebulization 2 (two) times daily., Disp: , Rfl:    Cholecalciferol (VITAMIN D3) 125 MCG (5000 UT) TABS, Take 5,000 Units by mouth daily., Disp: , Rfl:    dapsone 25 MG tablet, TAKE 2 TABLETS BY MOUTH 2 TIMES DAILY., Disp: 360 tablet, Rfl: 1   esomeprazole (NEXIUM) 40 MG capsule, Take 1 capsule (40 mg total) by mouth 2 (two) times daily. For heartburn., Disp: 180 capsule, Rfl: 0   famotidine (PEPCID) 40 MG tablet, Take 1 tablet (40 mg total) by mouth 2 (two) times daily. for heartburn., Disp: 180 tablet, Rfl: 2   hydrOXYzine (VISTARIL) 50 MG capsule, TAKE 1 CAPSULE BY MOUTH ONCE OR TWICE DAILY AS NEEDED FOR ANXIETY, Disp: 180 capsule, Rfl: 2   levocetirizine (XYZAL) 5 MG tablet, Take 1 tablet (5 mg total) by mouth 2 (two) times daily. For allergies, Disp: 180 tablet, Rfl: 3   Multiple Vitamin (MULTIVITAMIN) capsule,  Take 1 capsule by mouth daily., Disp: , Rfl:    mycophenolate (CELLCEPT) 500 MG tablet, Take 3 tablets (1,500 mg total) by mouth 2 (two) times daily., Disp: 120 tablet, Rfl: 3   ondansetron (ZOFRAN) 4 MG tablet, Take 1 tablet (4 mg total) by mouth every 8 (eight) hours as needed for nausea or vomiting., Disp: 20 tablet, Rfl: 0   oxyCODONE-acetaminophen (PERCOCET) 7.5-325 MG tablet, Take 1 tablet by mouth every 6 (six) hours as needed for up to 20 doses for severe pain., Disp: 20 tablet, Rfl: 0   predniSONE (DELTASONE) 10 MG tablet, Take 2 tabs (20mg )  Twice daily, Disp: 120 tablet, Rfl: 1   rosuvastatin (CRESTOR) 10 MG tablet, TAKE 1 TABLET BY MOUTH EVERY DAY FOR CHOLESTEROL, Disp: 90 tablet, Rfl: 2   Semaglutide-Weight Management (WEGOVY) 0.25 MG/0.5ML SOAJ, Inject 0.25 mg into the skin once weekly for 4 weeks, then increase to 0.5 mg once weekly thereafter., Disp: 6 mL, Rfl: 0   vitamin B-12 (CYANOCOBALAMIN) 1000 MCG tablet, Take 1,000 mcg by mouth daily., Disp: , Rfl:    vitamin C (ASCORBIC ACID) 500 MG tablet, Take 500 mg by mouth daily., Disp: , Rfl:    vitamin E 180 MG (400 UNITS) capsule, Take 400 Units by mouth daily., Disp: , Rfl:    clobetasol ointment (TEMOVATE) 0.05 %, Apply 1 Application topically 2 (two) times daily. (Patient not taking: Reported on 08/15/2022), Disp: 30 g, Rfl:  0   diclofenac Sodium (VOLTAREN ARTHRITIS PAIN) 1 % GEL, Apply 2 g topically daily as needed (Chest pain). (Patient not taking: Reported on 08/15/2022), Disp: , Rfl:    formoterol (PERFOROMIST) 20 MCG/2ML nebulizer solution, Take 2 mLs (20 mcg total) by nebulization 2 (two) times daily. (Patient taking differently: Take 20 mcg by nebulization daily as needed (Shortness of breath).), Disp: 120 mL, Rfl: 1   formoterol (PERFOROMIST) 20 MCG/2ML nebulizer solution, Take 20 mcg by nebulization 2 (two) times daily. (Patient not taking: Reported on 08/15/2022), Disp: , Rfl:    mometasone-formoterol (DULERA) 200-5 MCG/ACT  AERO, Inhale 2 puffs into the lungs every 12 (twelve) hours. (Patient not taking: Reported on 08/15/2022), Disp: 13 g, Rfl: 5   Omega-3 Fatty Acids (OMEGA-3 FISH OIL) 300 MG CAPS, Take 1,000 mg by mouth. (Patient not taking: Reported on 08/15/2022), Disp: , Rfl:    sucralfate (CARAFATE) 1 g tablet, Take 1 tablet (1 g total) by mouth in the morning, at noon, and at bedtime. (Patient not taking: Reported on 08/15/2022), Disp: 90 tablet, Rfl: 5  Past Medical History: Past Medical History:  Diagnosis Date   Arthritis    Asthma    Environmental allergies    Esophageal dysphagia    Generalized anxiety disorder    GERD (gastroesophageal reflux disease)    History of hiatal hernia    Hx of colonic polyps    Hypertension    Lung disorder    Patient states she has two unidentified ILD   Migraines    MRSA (methicillin resistant Staphylococcus aureus)    Lt upper arm  approx 8 years ago   Sleep apnea     Tobacco Use: Social History   Tobacco Use  Smoking Status Former   Packs/day: 2.00   Years: 18.00   Additional pack years: 0.00   Total pack years: 36.00   Types: Cigarettes   Start date: 27   Quit date: 03/16/2007   Years since quitting: 15.4   Passive exposure: Current  Smokeless Tobacco Never    Labs: Review Flowsheet  More data exists      Latest Ref Rng & Units 04/07/2019 07/07/2019 03/17/2020 06/09/2021 05/31/2022  Labs for ITP Cardiac and Pulmonary Rehab  Cholestrol 0 - 200 mg/dL 161  096  045  409  811   LDL (calc) 0 - 99 mg/dL 78  914  94  - -  Direct LDL mg/dL - - - 782.9  562.1   HDL-C >39.00 mg/dL 30.86  57.84  69.62  95.28  66.80   Trlycerides 0.0 - 149.0 mg/dL 413.2  440.1  027.2  536.6  252.0   Hemoglobin A1c 4.6 - 6.5 % - - 5.5  5.6  5.5     Capillary Blood Glucose: No results found for: "GLUCAP"   Pulmonary Assessment Scores:  Pulmonary Assessment Scores     Row Name 08/15/22 0925         ADL UCSD   ADL Phase Entry     SOB Score total 65       CAT  Score   CAT Score 19       mMRC Score   mMRC Score 4             UCSD: Self-administered rating of dyspnea associated with activities of daily living (ADLs) 6-point scale (0 = "not at all" to 5 = "maximal or unable to do because of breathlessness")  Scoring Scores range from 0 to 120.  Minimally important  difference is 5 units  CAT: CAT can identify the health impairment of COPD patients and is better correlated with disease progression.  CAT has a scoring range of zero to 40. The CAT score is classified into four groups of low (less than 10), medium (10 - 20), high (21-30) and very high (31-40) based on the impact level of disease on health status. A CAT score over 10 suggests significant symptoms.  A worsening CAT score could be explained by an exacerbation, poor medication adherence, poor inhaler technique, or progression of COPD or comorbid conditions.  CAT MCID is 2 points  mMRC: mMRC (Modified Medical Research Council) Dyspnea Scale is used to assess the degree of baseline functional disability in patients of respiratory disease due to dyspnea. No minimal important difference is established. A decrease in score of 1 point or greater is considered a positive change.   Pulmonary Function Assessment:  Pulmonary Function Assessment - 08/15/22 0924       Breath   Shortness of Breath Yes;Limiting activity             Exercise Target Goals: Exercise Program Goal: Individual exercise prescription set using results from initial 6 min walk test and THRR while considering  patient's activity barriers and safety.   Exercise Prescription Goal: Initial exercise prescription builds to 30-45 minutes a day of aerobic activity, 2-3 days per week.  Home exercise guidelines will be given to patient during program as part of exercise prescription that the participant will acknowledge.  Activity Barriers & Risk Stratification:  Activity Barriers & Cardiac Risk Stratification - 08/15/22  0927       Activity Barriers & Cardiac Risk Stratification   Activity Barriers Deconditioning;Muscular Weakness;Shortness of Breath;Arthritis;Neck/Spine Problems;Joint Problems    Comments Rt shoulder bursitis and hip pain             6 Minute Walk:  6 Minute Walk     Row Name 08/15/22 1030         6 Minute Walk   Phase Initial     Distance 1170 feet     Walk Time 6 minutes     # of Rest Breaks 2  2:29-2:40, 4:56-5:10     MPH 2.22     METS 3.55     RPE 11     Perceived Dyspnea  2     VO2 Peak 12.44     Symptoms No     Resting HR 87 bpm     Resting BP 104/62     Resting Oxygen Saturation  96 %     Exercise Oxygen Saturation  during 6 min walk 85 %     Max Ex. HR 107 bpm     Max Ex. BP 134/70     2 Minute Post BP 118/64       Interval HR   1 Minute HR 104     2 Minute HR 107     3 Minute HR 102     4 Minute HR 98     5 Minute HR 97     6 Minute HR 99     2 Minute Post HR 79     Interval Heart Rate? Yes       Interval Oxygen   Interval Oxygen? Yes     Baseline Oxygen Saturation % 96 %     1 Minute Oxygen Saturation % 94 %     1 Minute Liters of Oxygen 8 L  2 Minute Oxygen Saturation % 90 %     2 Minute Liters of Oxygen 8 L     3 Minute Oxygen Saturation % 91 %  2:29 85%     3 Minute Liters of Oxygen 8 L  increased to 10L     4 Minute Oxygen Saturation % 95 %     4 Minute Liters of Oxygen 10 L     5 Minute Oxygen Saturation % 98 %     5 Minute Liters of Oxygen 10 L     6 Minute Oxygen Saturation % 94 %     6 Minute Liters of Oxygen 10 L     2 Minute Post Oxygen Saturation % 99 %     2 Minute Post Liters of Oxygen 8 L              Oxygen Initial Assessment:  Oxygen Initial Assessment - 08/15/22 0923       Home Oxygen   Home Oxygen Device E-Tanks;Home Concentrator    Sleep Oxygen Prescription Continuous    Liters per minute 4    Home Exercise Oxygen Prescription Continuous    Liters per minute 8    Home Resting Oxygen Prescription  Continuous    Liters per minute 2    Compliance with Home Oxygen Use Yes      Initial 6 min Walk   Oxygen Used Continuous    Liters per minute 8      Program Oxygen Prescription   Program Oxygen Prescription Continuous    Liters per minute 8      Intervention   Short Term Goals To learn and exhibit compliance with exercise, home and travel O2 prescription;To learn and understand importance of maintaining oxygen saturations>88%;To learn and demonstrate proper use of respiratory medications;To learn and understand importance of monitoring SPO2 with pulse oximeter and demonstrate accurate use of the pulse oximeter.;To learn and demonstrate proper pursed lip breathing techniques or other breathing techniques.     Long  Term Goals Exhibits compliance with exercise, home  and travel O2 prescription;Verbalizes importance of monitoring SPO2 with pulse oximeter and return demonstration;Maintenance of O2 saturations>88%;Exhibits proper breathing techniques, such as pursed lip breathing or other method taught during program session;Compliance with respiratory medication;Demonstrates proper use of MDI's             Oxygen Re-Evaluation:   Oxygen Discharge (Final Oxygen Re-Evaluation):   Initial Exercise Prescription:  Initial Exercise Prescription - 08/15/22 1000       Date of Initial Exercise RX and Referring Provider   Date 08/15/22    Referring Provider Soskis   sees [name]   Expected Discharge Date 11/08/22      Oxygen   Oxygen Continuous    Liters 8      Treadmill   MPH 2    Grade 0    Minutes 15    METs 2.53      Recumbant Elliptical   Level 1    RPM 30    Watts 70    Minutes 15    METs 2.3      Prescription Details   Frequency (times per week) 2    Duration Progress to 30 minutes of continuous aerobic without signs/symptoms of physical distress      Intensity   THRR 40-80% of Max Heartrate 69-138    Ratings of Perceived Exertion 11-13    Perceived Dyspnea 0-4       Progression   Progression Continue  to progress workloads to maintain intensity without signs/symptoms of physical distress.      Resistance Training   Training Prescription Yes    Weight blue bands    Reps 10-15             Perform Capillary Blood Glucose checks as needed.  Exercise Prescription Changes:   Exercise Comments:   Exercise Goals and Review:   Exercise Goals     Row Name 08/15/22 0926             Exercise Goals   Increase Physical Activity Yes       Intervention Provide advice, education, support and counseling about physical activity/exercise needs.;Develop an individualized exercise prescription for aerobic and resistive training based on initial evaluation findings, risk stratification, comorbidities and participant's personal goals.       Expected Outcomes Short Term: Attend rehab on a regular basis to increase amount of physical activity.;Long Term: Exercising regularly at least 3-5 days a week.;Long Term: Add in home exercise to make exercise part of routine and to increase amount of physical activity.       Increase Strength and Stamina Yes       Intervention Provide advice, education, support and counseling about physical activity/exercise needs.;Develop an individualized exercise prescription for aerobic and resistive training based on initial evaluation findings, risk stratification, comorbidities and participant's personal goals.       Expected Outcomes Short Term: Increase workloads from initial exercise prescription for resistance, speed, and METs.;Short Term: Perform resistance training exercises routinely during rehab and add in resistance training at home;Long Term: Improve cardiorespiratory fitness, muscular endurance and strength as measured by increased METs and functional capacity ( )       Able to understand and use rate of perceived exertion (RPE) scale Yes       Intervention Provide education and explanation on how to use RPE scale        Expected Outcomes Short Term: Able to use RPE daily in rehab to express subjective intensity level;Long Term:  Able to use RPE to guide intensity level when exercising independently       Able to understand and use Dyspnea scale Yes       Intervention Provide education and explanation on how to use Dyspnea scale       Expected Outcomes Short Term: Able to use Dyspnea scale daily in rehab to express subjective sense of shortness of breath during exertion;Long Term: Able to use Dyspnea scale to guide intensity level when exercising independently       Knowledge and understanding of Target Heart Rate Range (THRR) Yes       Intervention Provide education and explanation of THRR including how the numbers were predicted and where they are located for reference       Expected Outcomes Short Term: Able to state/look up THRR;Long Term: Able to use THRR to govern intensity when exercising independently;Short Term: Able to use daily as guideline for intensity in rehab       Understanding of Exercise Prescription Yes       Intervention Provide education, explanation, and written materials on patient's individual exercise prescription       Expected Outcomes Short Term: Able to explain program exercise prescription;Long Term: Able to explain home exercise prescription to exercise independently                Exercise Goals Re-Evaluation :   Discharge Exercise Prescription (Final Exercise Prescription Changes):   Nutrition:  Target Goals: Understanding  of nutrition guidelines, daily intake of sodium 1500mg , cholesterol 200mg , calories 30% from fat and 7% or less from saturated fats, daily to have 5 or more servings of fruits and vegetables.  Biometrics:    Nutrition Therapy Plan and Nutrition Goals:   Nutrition Assessments:  MEDIFICTS Score Key: ?70 Need to make dietary changes  40-70 Heart Healthy Diet ? 40 Therapeutic Level Cholesterol Diet   Picture Your Plate Scores: <86  Unhealthy dietary pattern with much room for improvement. 41-50 Dietary pattern unlikely to meet recommendations for good health and room for improvement. 51-60 More healthful dietary pattern, with some room for improvement.  >60 Healthy dietary pattern, although there may be some specific behaviors that could be improved.    Nutrition Goals Re-Evaluation:   Nutrition Goals Discharge (Final Nutrition Goals Re-Evaluation):   Psychosocial: Target Goals: Acknowledge presence or absence of significant depression and/or stress, maximize coping skills, provide positive support system. Participant is able to verbalize types and ability to use techniques and skills needed for reducing stress and depression.  Initial Review & Psychosocial Screening:  Initial Psych Review & Screening - 08/15/22 5784       Initial Review   Current issues with Current Anxiety/Panic    Comments Pt is on medication for anixety. Feels that anxiety is controlled.      Family Dynamics   Good Support System? Yes    Comments mother, 3 brothers, step mother, and friends      Barriers   Psychosocial barriers to participate in program There are no identifiable barriers or psychosocial needs.      Screening Interventions   Interventions Encouraged to exercise             Quality of Life Scores:  Scores of 19 and below usually indicate a poorer quality of life in these areas.  A difference of  2-3 points is a clinically meaningful difference.  A difference of 2-3 points in the total score of the Quality of Life Index has been associated with significant improvement in overall quality of life, self-image, physical symptoms, and general health in studies assessing change in quality of life.  PHQ-9: Review Flowsheet       08/15/2022 08/14/2022 06/09/2021  Depression screen PHQ 2/9  Decreased Interest 1 1 1   Down, Depressed, Hopeless 0 0 0  PHQ - 2 Score 1 1 1   Altered sleeping 0 - 1  Tired, decreased energy 0  - 1  Change in appetite 1 - 0  Feeling bad or failure about yourself  0 - 0  Trouble concentrating 0 - 0  Moving slowly or fidgety/restless 0 - 0  Suicidal thoughts 0 - 0  PHQ-9 Score 2 - 3  Difficult doing work/chores Not difficult at all - Not difficult at all   Interpretation of Total Score  Total Score Depression Severity:  1-4 = Minimal depression, 5-9 = Mild depression, 10-14 = Moderate depression, 15-19 = Moderately severe depression, 20-27 = Severe depression   Psychosocial Evaluation and Intervention:  Psychosocial Evaluation - 08/15/22 0919       Psychosocial Evaluation & Interventions   Interventions Encouraged to exercise with the program and follow exercise prescription    Comments Hannah Klein denies any psychosocial barriers to participating in rehab. Will continue to monitor.    Expected Outcomes For [name] to participate in rehab free of psychosocial barriers.    Continue Psychosocial Services  No Follow up required  Psychosocial Re-Evaluation:   Psychosocial Discharge (Final Psychosocial Re-Evaluation):   Education: Education Goals: Education classes will be provided on a weekly basis, covering required topics. Participant will state understanding/return demonstration of topics presented.  Learning Barriers/Preferences:  Learning Barriers/Preferences - 08/15/22 0920       Learning Barriers/Preferences   Learning Barriers Sight   wears glasses   Learning Preferences Group Instruction;Individual Instruction;Skilled Demonstration             Education Topics: Introduction to Pulmonary Rehab Group instruction provided by PowerPoint, verbal discussion, and written material to support subject matter. Instructor reviews what Pulmonary Rehab is, the purpose of the program, and how patients are referred.     Know Your Numbers Group instruction that is supported by a PowerPoint presentation. Instructor discusses importance of knowing and  understanding resting, exercise, and post-exercise oxygen saturation, heart rate, and blood pressure. Oxygen saturation, heart rate, blood pressure, rating of perceived exertion, and dyspnea are reviewed along with a normal range for these values.    Exercise for the Pulmonary Patient Group instruction that is supported by a PowerPoint presentation. Instructor discusses benefits of exercise, core components of exercise, frequency, duration, and intensity of an exercise routine, importance of utilizing pulse oximetry during exercise, safety while exercising, and options of places to exercise outside of rehab.       MET Level  Group instruction provided by PowerPoint, verbal discussion, and written material to support subject matter. Instructor reviews what METs are and how to increase METs.    Pulmonary Medications Verbally interactive group education provided by instructor with focus on inhaled medications and proper administration.   Anatomy and Physiology of the Respiratory System Group instruction provided by PowerPoint, verbal discussion, and written material to support subject matter. Instructor reviews respiratory cycle and anatomical components of the respiratory system and their functions. Instructor also reviews differences in obstructive and restrictive respiratory diseases with examples of each.    Oxygen Safety Group instruction provided by PowerPoint, verbal discussion, and written material to support subject matter. There is an overview of "What is Oxygen" and "Why do we need it".  Instructor also reviews how to create a safe environment for oxygen use, the importance of using oxygen as prescribed, and the risks of noncompliance. There is a brief discussion on traveling with oxygen and resources the patient may utilize.   Oxygen Use Group instruction provided by PowerPoint, verbal discussion, and written material to discuss how supplemental oxygen is prescribed and different  types of oxygen supply systems. Resources for more information are provided.    Breathing Techniques Group instruction that is supported by demonstration and informational handouts. Instructor discusses the benefits of pursed lip and diaphragmatic breathing and detailed demonstration on how to perform both.     Risk Factor Reduction Group instruction that is supported by a PowerPoint presentation. Instructor discusses the definition of a risk factor, different risk factors for pulmonary disease, and how the heart and lungs work together.   MD Day A group question and answer session with a medical doctor that allows participants to ask questions that relate to their pulmonary disease state.   Nutrition for the Pulmonary Patient Group instruction provided by PowerPoint slides, verbal discussion, and written materials to support subject matter. The instructor gives an explanation and review of healthy diet recommendations, which includes a discussion on weight management, recommendations for fruit and vegetable consumption, as well as protein, fluid, caffeine, fiber, sodium, sugar, and alcohol. Tips for eating when patients are short  of breath are discussed.    Other Education Group or individual verbal, written, or video instructions that support the educational goals of the pulmonary rehab program.    Knowledge Questionnaire Score:  Knowledge Questionnaire Score - 08/15/22 0929       Knowledge Questionnaire Score   Pre Score 18/18             Core Components/Risk Factors/Patient Goals at Admission:  Personal Goals and Risk Factors at Admission - 08/15/22 0920       Core Components/Risk Factors/Patient Goals on Admission    Weight Management Yes;Weight Loss    Intervention Weight Management: Develop a combined nutrition and exercise program designed to reach desired caloric intake, while maintaining appropriate intake of nutrient and fiber, sodium and fats, and appropriate  energy expenditure required for the weight goal.;Weight Management: Provide education and appropriate resources to help participant work on and attain dietary goals.;Weight Management/Obesity: Establish reasonable short term and long term weight goals.;Obesity: Provide education and appropriate resources to help participant work on and attain dietary goals.    Expected Outcomes Short Term: Continue to assess and modify interventions until short term weight is achieved;Long Term: Adherence to nutrition and physical activity/exercise program aimed toward attainment of established weight goal;Weight Maintenance: Understanding of the daily nutrition guidelines, which includes 25-35% calories from fat, 7% or less cal from saturated fats, less than 200mg  cholesterol, less than 1.5gm of sodium, & 5 or more servings of fruits and vegetables daily;Weight Loss: Understanding of general recommendations for a balanced deficit meal plan, which promotes 1-2 lb weight loss per week and includes a negative energy balance of [id number] kcal/d;Understanding recommendations for meals to include 15-35% energy as protein, 25-35% energy from fat, 35-60% energy from carbohydrates, less than 200mg  of dietary cholesterol, 20-35 gm of total fiber daily;Understanding of distribution of calorie intake throughout the day with the consumption of 4-5 meals/snacks    Improve shortness of breath with ADL's Yes    Intervention Provide education, individualized exercise plan and daily activity instruction to help decrease symptoms of SOB with activities of daily living.    Expected Outcomes Short Term: Improve cardiorespiratory fitness to achieve a reduction of symptoms when performing ADLs;Long Term: Be able to perform more ADLs without symptoms or delay the onset of symptoms             Core Components/Risk Factors/Patient Goals Review:    Core Components/Risk Factors/Patient Goals at Discharge (Final Review):    ITP Comments:Dr.  Mechele Collin is Medical Director for Pulmonary Rehab at [facility].

## 2022-08-15 NOTE — Progress Notes (Signed)
Hannah Klein 47 y.o. female Pulmonary Rehab Orientation Note This patient who was referred to Pulmonary Rehab by Dr. Evelina Dun with the diagnosis of ILD arrived today in Cardiac and Pulmonary Rehab. She  arrived ambulatory with normal gait. She  does carry portable oxygen. [employer] is the provider for their DME. Per patient, Hannah Klein uses oxygen continuously. Color good, skin warm and dry. Patient is oriented to time and place. Patient's medical history, psychosocial health, and medications reviewed. Psychosocial assessment reveals patient lives with alone. 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 Patient hobbies include watching tv and spending time with others. Patient reports her stress level is low. Areas of stress/anxiety include health. Patient does not exhibit signs of depression. PHQ2/9 score 1/2. Hannah Klein shows good  coping skills with positive outlook on life. Offered emotional support and reassurance. Will continue to monitor. Physical assessment performed by Hannah Hart RN. Please see their orientation physical assessment note. Hannah Klein reports she does take medications as prescribed. Patient states she follows a regular  diet. The patient has been trying to lose weight through a healthy diet and exercise program.. Patient's weight will be monitored closely. Demonstration and practice of PLB using pulse oximeter. Hannah Klein able to return demonstration satisfactorily. Safety and hand hygiene in the exercise area reviewed with patient. Hannah Klein voices understanding of the information reviewed. Department expectations discussed with patient and achievable goals were set. The patient shows enthusiasm about attending the program and we look forward to working with Hannah Klein. Hannah Klein completed a 6 min walk test today and is scheduled to begin exercise on 08/21/22 at 10:15 am.   [id number] Hannah San, MS, ACSM-CEP

## 2022-08-16 ENCOUNTER — Ambulatory Visit (INDEPENDENT_AMBULATORY_CARE_PROVIDER_SITE_OTHER): Payer: Commercial Managed Care - HMO

## 2022-08-16 DIAGNOSIS — Z23 Encounter for immunization: Secondary | ICD-10-CM | POA: Diagnosis not present

## 2022-08-16 NOTE — Telephone Encounter (Signed)
I spoke with pt and she did not have pen to write name of Hep B vaccine. Pt asked me to mychart note to her with info. Nothing further needed at this time.

## 2022-08-16 NOTE — Telephone Encounter (Signed)
Dr. Isaiah Serge, please advise on order for home fill unit for O2  I am having to go and have my oxygen tanks replaced at least once a week and sometimes more due to appointments and things I need to get done.  My tanks only last about an hour and 25 minutes with using the 8L now.  I returned the portable oxygen machine due to it only went to 5L on pulsating.  I am also starting pulmonary rehab and will need more oxygen to do my exercises.  I spoke with the ladies at the oxygen place, and they said I could possibly get a homefill unit that would attach to my home concentrator.  They said I would need a prescription and it would need to be faxed to both of these numbers [phone] and [phone].  Is it possible for me to try and get one of these homefill units?   Thank you Hannah Klein

## 2022-08-16 NOTE — Telephone Encounter (Signed)
Hannah Klein,  Will you please respond to patient regarding which Hepatitis B vaccine you provided her with?  Thanks!

## 2022-08-17 ENCOUNTER — Other Ambulatory Visit: Payer: Self-pay | Admitting: Primary Care

## 2022-08-17 DIAGNOSIS — G4733 Obstructive sleep apnea (adult) (pediatric): Secondary | ICD-10-CM

## 2022-08-17 DIAGNOSIS — E785 Hyperlipidemia, unspecified: Secondary | ICD-10-CM

## 2022-08-17 DIAGNOSIS — J849 Interstitial pulmonary disease, unspecified: Secondary | ICD-10-CM

## 2022-08-17 DIAGNOSIS — E6609 Other obesity due to excess calories: Secondary | ICD-10-CM

## 2022-08-17 MED ORDER — OZEMPIC (0.25 OR 0.5 MG/DOSE) 2 MG/3ML ~~LOC~~ SOPN
PEN_INJECTOR | SUBCUTANEOUS | 0 refills | Status: DC
Start: 2022-08-17 — End: 2022-08-20

## 2022-08-17 NOTE — Telephone Encounter (Signed)
Can we work on the PA for Ozmepic?

## 2022-08-17 NOTE — Progress Notes (Signed)
Per orders of Mayra Reel, DPN AGNP-C, injection of Heplisav-B given by Lewanda Rife in left deltoid. Patient tolerated injection well. Patient already has nurse visit scheduled for 2nd Hep b vaccine.Marland Kitchen

## 2022-08-18 NOTE — Telephone Encounter (Signed)
Ok to send the request to DME. Thanks

## 2022-08-20 ENCOUNTER — Other Ambulatory Visit: Payer: Self-pay | Admitting: Primary Care

## 2022-08-20 DIAGNOSIS — J849 Interstitial pulmonary disease, unspecified: Secondary | ICD-10-CM

## 2022-08-20 DIAGNOSIS — E6609 Other obesity due to excess calories: Secondary | ICD-10-CM

## 2022-08-20 DIAGNOSIS — E785 Hyperlipidemia, unspecified: Secondary | ICD-10-CM

## 2022-08-20 DIAGNOSIS — G4733 Obstructive sleep apnea (adult) (pediatric): Secondary | ICD-10-CM

## 2022-08-20 MED ORDER — ZEPBOUND 2.5 MG/0.5ML ~~LOC~~ SOAJ
2.50 mg | SUBCUTANEOUS | 0 refills | Status: DC
Start: 2022-08-20 — End: 2022-09-03

## 2022-08-21 ENCOUNTER — Encounter (HOSPITAL_COMMUNITY)
Admission: RE | Admit: 2022-08-21 | Discharge: 2022-08-21 | Disposition: A | Discharge status: Home / Self Care | Payer: Commercial Managed Care - HMO | Source: Ambulatory Visit | Attending: Pulmonary Disease | Admitting: Pulmonary Disease

## 2022-08-21 VITALS — Wt 236.1 lb

## 2022-08-21 DIAGNOSIS — J849 Interstitial pulmonary disease, unspecified: Secondary | ICD-10-CM | POA: Diagnosis not present

## 2022-08-21 NOTE — Telephone Encounter (Signed)
Can we complete the PA?

## 2022-08-21 NOTE — Progress Notes (Signed)
Daily Session Note  Patient Details  Name: Hannah Klein MRN: [id number] Date of Birth: 15-Dec-1975 Referring Provider:   Doristine Devoid Pulmonary Rehab Walk Test from 08/15/2022 in [facility] for Heart, Vascular, & Lung Health  Referring Provider Soskis  [sees [name]]       Encounter Date: 08/21/2022  Check In:  Session Check In - 08/21/22 1033       Check-In   Supervising physician immediately available to respond to emergencies [facility] MD immediately available    Physician(s) Edd Fabian, NP    Location [facility]    Staff Present Samantha Belarus, RD, Dutch Gray, RN, BSN;Tarisa Paola BS, ACSM-CEP, Exercise Physiologist;Kaylee Earlene Plater, MS, ACSM-CEP, Exercise Physiologist;Casey Katrinka Blazing, RT    Virtual Visit No    Medication changes reported     No    Fall or balance concerns reported    No    Tobacco Cessation No Change    Warm-up and Cool-down Performed as group-led instruction    Resistance Training Performed Yes    VAD Patient? No    PAD/SET Patient? No      Pain Assessment   Currently in Pain? No/denies    Multiple Pain Sites No             Capillary Blood Glucose: No results found for this or any previous visit (from the past 24 hour(s)).   Exercise Prescription Changes - 08/21/22 1200       Response to Exercise   Blood Pressure (Admit) 114/66    Blood Pressure (Exercise) 140/66    Blood Pressure (Exit) 102/64    Heart Rate (Admit) 75 bpm    Heart Rate (Exercise) 110 bpm    Heart Rate (Exit) 80 bpm    Oxygen Saturation (Admit) 98 %    Oxygen Saturation (Exercise) 92 %    Oxygen Saturation (Exit) 97 %    Rating of Perceived Exertion (Exercise) 15    Perceived Dyspnea (Exercise) 2    Symptoms DOE    Duration Progress to 30 minutes of  aerobic without signs/symptoms of physical distress    Intensity THRR unchanged      Progression   Progression Continue to progress workloads to maintain intensity without  signs/symptoms of physical distress.    Average METs 2.3      Resistance Training   Training Prescription Yes    Weight blue bands    Reps 10-15      Oxygen   Oxygen Continuous    Liters 10      Treadmill   MPH 1.8    Grade 0    Minutes 15    METs 2.38      Recumbant Elliptical   Level 2    RPM 40    Minutes 15    METs 2.1      Oxygen   Maintain Oxygen Saturation 88% or higher             Social History   Tobacco Use  Smoking Status Former   Packs/day: 2.00   Years: 18.00   Additional pack years: 0.00   Total pack years: 36.00   Types: Cigarettes   Start date: 28   Quit date: 03/16/2007   Years since quitting: 15.4   Passive exposure: Current  Smokeless Tobacco Never    Goals Met:  Exercise tolerated well No report of concerns or symptoms today Strength training completed today  Goals Unmet:  Not Applicable  Comments: Service  time is from 1017 to 1137.    Dr. Mechele Collin is Medical Director for Pulmonary Rehab at [facility].

## 2022-08-21 NOTE — Telephone Encounter (Signed)
Please submit PA for Zepbound. 

## 2022-08-23 ENCOUNTER — Other Ambulatory Visit (HOSPITAL_COMMUNITY): Payer: Self-pay

## 2022-08-23 ENCOUNTER — Encounter (HOSPITAL_COMMUNITY)
Admission: RE | Admit: 2022-08-23 | Discharge: 2022-08-23 | Disposition: A | Discharge status: Home / Self Care | Payer: Commercial Managed Care - HMO | Source: Ambulatory Visit | Attending: Pulmonary Disease | Admitting: Pulmonary Disease

## 2022-08-23 DIAGNOSIS — J849 Interstitial pulmonary disease, unspecified: Secondary | ICD-10-CM

## 2022-08-23 NOTE — Progress Notes (Signed)
Daily Session Note  Patient Details  Name: Hannah Klein MRN: [id number] Date of Birth: [birth year] Referring Provider:   Doristine Devoid Pulmonary Rehab Walk Test from 08/15/2022 in [facility] for Heart, Vascular, & Lung Health  Referring Provider Soskis  [sees [name]]       Encounter Date: 08/23/2022  Check In:  Session Check In - 08/23/22 1048       Check-In   Supervising physician immediately available to respond to emergencies [facility] MD immediately available    Physician(s) Robin Searing, NP    Location [facility]    Staff Present Samantha Belarus, RD, Dutch Gray, RN, BSN;Randi Idelle Crouch BS, ACSM-CEP, Exercise Physiologist;Demetrus Pavao Earlene Plater, MS, ACSM-CEP, Exercise Physiologist;Casey Katrinka Blazing, RT    Virtual Visit No    Medication changes reported     No    Fall or balance concerns reported    No    Tobacco Cessation No Change    Warm-up and Cool-down Performed as group-led instruction    Resistance Training Performed Yes    VAD Patient? No    PAD/SET Patient? No      Pain Assessment   Currently in Pain? No/denies    Multiple Pain Sites No             Capillary Blood Glucose: No results found for this or any previous visit (from the past 24 hour(s)).    Social History   Tobacco Use  Smoking Status Former   Packs/day: 2.00   Years: 18.00   Additional pack years: 0.00   Total pack years: 36.00   Types: Cigarettes   Start date: 23   Quit date: 03/16/2007   Years since quitting: 15.4   Passive exposure: Current  Smokeless Tobacco Never    Goals Met:  Proper associated with RPD/PD & O2 Sat Exercise tolerated well No report of concerns or symptoms today Strength training completed today  Goals Unmet:  Not Applicable  Comments: Service time is from 1016 to 1150.    Dr. Mechele Collin is Medical Director for Pulmonary Rehab at [facility].

## 2022-08-24 ENCOUNTER — Other Ambulatory Visit: Payer: Self-pay | Admitting: Pharmacy Technician

## 2022-08-24 ENCOUNTER — Encounter: Payer: Self-pay | Admitting: Hematology

## 2022-08-24 ENCOUNTER — Other Ambulatory Visit (HOSPITAL_COMMUNITY): Payer: Self-pay

## 2022-08-24 ENCOUNTER — Encounter: Payer: Self-pay | Admitting: Pulmonary Disease

## 2022-08-24 ENCOUNTER — Telehealth: Payer: Self-pay

## 2022-08-24 NOTE — Telephone Encounter (Signed)
Pharmacy Patient Advocate Encounter   Received notification that prior authorization for Zepbound 2.5mg /0.35ml is required/requested.   PA submitted to [employer] via CoverMyMeds Key  # [id number] Status is drug is not covered by plan.   Placed a call to [employer] at [phone], per the representative, the plan does not cover Zepbound.

## 2022-08-24 NOTE — Telephone Encounter (Signed)
Please call and notify patient/

## 2022-08-27 ENCOUNTER — Other Ambulatory Visit (INDEPENDENT_AMBULATORY_CARE_PROVIDER_SITE_OTHER): Payer: Commercial Managed Care - HMO

## 2022-08-27 ENCOUNTER — Other Ambulatory Visit: Payer: Self-pay

## 2022-08-27 DIAGNOSIS — J849 Interstitial pulmonary disease, unspecified: Secondary | ICD-10-CM | POA: Diagnosis not present

## 2022-08-27 LAB — COMPREHENSIVE METABOLIC PANEL
ALT: 15 U/L (ref 0–35)
AST: 22 U/L (ref 0–37)
Albumin: 4.8 g/dL (ref 3.5–5.2)
Alkaline Phosphatase: 40 U/L (ref 39–117)
BUN: 17 mg/dL (ref 6–23)
CO2: 26 mEq/L (ref 19–32)
Calcium: 10.4 mg/dL (ref 8.4–10.5)
Chloride: 99 mEq/L (ref 96–112)
Creatinine, Ser: 0.79 mg/dL (ref 0.40–1.20)
GFR: 88.64 mL/min (ref >60.00)
Glucose, Bld: 103 mg/dL — ABNORMAL HIGH (ref 70–99)
Potassium: 4.2 mEq/L (ref 3.5–5.1)
Sodium: 134 mEq/L — ABNORMAL LOW (ref 135–145)
Total Bilirubin: 0.7 mg/dL (ref 0.2–1.2)
Total Protein: 7.7 g/dL (ref 6.0–8.3)

## 2022-08-27 LAB — CBC WITH DIFFERENTIAL/PLATELET
Basophils Absolute: 0 10*3/uL (ref 0.0–0.1)
Basophils Relative: 0.3 % (ref 0.0–3.0)
Eosinophils Absolute: 0.1 10*3/uL (ref 0.0–0.7)
Eosinophils Relative: 0.4 % (ref 0.0–5.0)
HCT: 37.1 % (ref 36.0–46.0)
Hemoglobin: 11.7 g/dL — ABNORMAL LOW (ref 12.0–15.0)
Lymphocytes Relative: 5.3 % — ABNORMAL LOW (ref 12.0–46.0)
Lymphs Abs: 0.7 10*3/uL (ref 0.7–4.0)
MCHC: 31.7 g/dL (ref 30.0–36.0)
MCV: 97 fl (ref 78.0–100.0)
Monocytes Absolute: 0.8 10*3/uL (ref 0.1–1.0)
Monocytes Relative: 5.8 % (ref 3.0–12.0)
Neutro Abs: 12.3 10*3/uL — ABNORMAL HIGH (ref 1.4–7.7)
Neutrophils Relative %: 88.2 % — ABNORMAL HIGH (ref 43.0–77.0)
Platelets: 203 10*3/uL (ref 150.0–400.0)
RBC: 3.82 Mil/uL — ABNORMAL LOW (ref 3.87–5.11)
RDW: 14.8 % (ref 11.5–15.5)
WBC: 13.9 10*3/uL — ABNORMAL HIGH (ref 4.0–10.5)

## 2022-08-27 LAB — URINALYSIS
Bilirubin Urine: NEGATIVE
Hgb urine dipstick: NEGATIVE
Ketones, ur: NEGATIVE
Leukocytes,Ua: NEGATIVE
Nitrite: NEGATIVE
Specific Gravity, Urine: 1.025 (ref 1.000–1.030)
Total Protein, Urine: NEGATIVE
Urine Glucose: NEGATIVE
Urobilinogen, UA: 0.2 (ref 0.0–1.0)
pH: 5.5 (ref 5.0–8.0)

## 2022-08-27 MED ORDER — SODIUM CHLORIDE 0.9 % IV SOLN
400.0000 mg/m2 | Freq: Once | INTRAVENOUS | Status: AC
  Administered 2022-08-28: 900 mg via INTRAVENOUS
  Filled 2022-08-27: qty 9

## 2022-08-27 MED ORDER — SODIUM CHLORIDE 0.9 % IV SOLN
1680.0000 mg | Freq: Once | INTRAVENOUS | Status: AC
  Administered 2022-08-28: 1680 mg via INTRAVENOUS
  Filled 2022-08-27: qty 84

## 2022-08-28 ENCOUNTER — Other Ambulatory Visit: Payer: Self-pay

## 2022-08-28 ENCOUNTER — Ambulatory Visit (INDEPENDENT_AMBULATORY_CARE_PROVIDER_SITE_OTHER): Payer: Commercial Managed Care - HMO

## 2022-08-28 VITALS — BP 127/86 | HR 65 | Temp 97.6°F | Resp 18

## 2022-08-28 DIAGNOSIS — M3502 Sicca syndrome with lung involvement: Secondary | ICD-10-CM

## 2022-08-28 DIAGNOSIS — J849 Interstitial pulmonary disease, unspecified: Secondary | ICD-10-CM | POA: Diagnosis not present

## 2022-08-28 MED ORDER — SODIUM CHLORIDE 0.9 % IV SOLN: Freq: Once | INTRAVENOUS | Status: DC | PRN

## 2022-08-28 MED ORDER — SODIUM CHLORIDE 0.9% FLUSH: 10.0000 mL | Freq: Once | INTRAVENOUS | Status: DC | PRN

## 2022-08-28 MED ORDER — DIPHENHYDRAMINE HCL 50 MG/ML IJ SOLN: 50.0000 mg | Freq: Once | INTRAMUSCULAR | Status: DC | PRN

## 2022-08-28 MED ORDER — DEXTROSE-SODIUM CHLORIDE 5-0.45 % IV SOLN
INTRAVENOUS | Status: DC
  Filled 2022-08-28 (×2): qty 1000

## 2022-08-28 MED ORDER — HEPARIN SOD (PORK) LOCK FLUSH 100 UNIT/ML IV SOLN: 250.0000 [IU] | Freq: Once | INTRAVENOUS | Status: DC | PRN

## 2022-08-28 MED ORDER — ONDANSETRON 4 MG PO TBDP
8.0000 mg | ORAL_TABLET | Freq: Once | ORAL | Status: AC
  Administered 2022-08-28: 8 mg via ORAL

## 2022-08-28 MED ORDER — SODIUM CHLORIDE 0.9% FLUSH: 3.0000 mL | Freq: Once | INTRAVENOUS | Status: DC | PRN

## 2022-08-28 MED ORDER — METHYLPREDNISOLONE SODIUM SUCC 125 MG IJ SOLR: 125.0000 mg | Freq: Once | INTRAMUSCULAR | Status: DC | PRN

## 2022-08-28 MED ORDER — ONDANSETRON HCL 8 MG PO TABS
8.0000 mg | ORAL_TABLET | Freq: Once | ORAL | Status: AC
  Filled 2022-08-28: qty 1

## 2022-08-28 MED ORDER — DEXTROSE-SODIUM CHLORIDE 5-0.45 % IV SOLN
INTRAVENOUS | Status: DC
  Administered 2022-08-28: 400 mL via INTRAVENOUS
  Filled 2022-08-28 (×2): qty 1000

## 2022-08-28 MED ORDER — ALTEPLASE 2 MG IJ SOLR: 2.0000 mg | Freq: Once | INTRAMUSCULAR | Status: DC | PRN

## 2022-08-28 MED ORDER — HYDROCORTISONE SOD SUC (PF) 100 MG IJ SOLR
100.0000 mg | Freq: Once | INTRAMUSCULAR | Status: AC
  Administered 2022-08-28: 100 mg via INTRAVENOUS
  Filled 2022-08-28: qty 2

## 2022-08-28 MED ORDER — FAMOTIDINE IN NACL 20-0.9 MG/50ML-% IV SOLN: 20.0000 mg | Freq: Once | INTRAVENOUS | Status: DC | PRN

## 2022-08-28 MED ORDER — ALBUTEROL SULFATE HFA 108 (90 BASE) MCG/ACT IN AERS: 2.0000 | INHALATION_SPRAY | Freq: Once | RESPIRATORY_TRACT | Status: DC | PRN

## 2022-08-28 MED ORDER — ANTICOAGULANT SODIUM CITRATE 4% (200MG/5ML) IV SOLN
5.0000 mL | Freq: Once | Status: DC | PRN
  Filled 2022-08-28: qty 5

## 2022-08-28 MED ORDER — EPINEPHRINE 0.3 MG/0.3ML IJ SOAJ: 0.3000 mg | Freq: Once | INTRAMUSCULAR | Status: DC | PRN

## 2022-08-28 MED ORDER — HEPARIN SOD (PORK) LOCK FLUSH 100 UNIT/ML IV SOLN: 500.0000 [IU] | Freq: Once | INTRAVENOUS | Status: DC | PRN

## 2022-08-28 NOTE — Progress Notes (Signed)
Diagnosis: Interstitial Lung Disease   Provider:  Chilton Greathouse MD   Procedure: IV Infusion   IV Type: Peripheral, IV Location: R Hand   D 5 1/2 NS - 0900-1105   [place] 900mg  1110-1140    Cytoxan 1680mg  1144-1224   [place] 900mg  1235-1309   D 5 1/2 NS 1311-1415       Post Infusion IV Care: Observation period completed   Discharge: Condition: Good, Destination: Home . AVS Provided   Performed by:  Marin Shutter, RN

## 2022-08-29 ENCOUNTER — Other Ambulatory Visit (HOSPITAL_COMMUNITY): Payer: Self-pay

## 2022-09-03 ENCOUNTER — Ambulatory Visit (INDEPENDENT_AMBULATORY_CARE_PROVIDER_SITE_OTHER): Payer: Commercial Managed Care - HMO | Admitting: Pulmonary Disease

## 2022-09-03 ENCOUNTER — Encounter: Payer: Self-pay | Admitting: Pulmonary Disease

## 2022-09-03 VITALS — BP 130/66 | HR 74 | Temp 98.0°F | Ht 69.0 in | Wt 235.6 lb

## 2022-09-03 DIAGNOSIS — G4733 Obstructive sleep apnea (adult) (pediatric): Secondary | ICD-10-CM

## 2022-09-03 DIAGNOSIS — J849 Interstitial pulmonary disease, unspecified: Secondary | ICD-10-CM

## 2022-09-03 DIAGNOSIS — R220 Localized swelling, mass and lump, head: Secondary | ICD-10-CM | POA: Diagnosis not present

## 2022-09-03 DIAGNOSIS — Z5181 Encounter for therapeutic drug level monitoring: Secondary | ICD-10-CM | POA: Diagnosis not present

## 2022-09-03 MED ORDER — PREDNISONE 5 MG PO TABS: 5.0000 mg | ORAL_TABLET | Freq: Every day | ORAL | 1 refills | Status: DC

## 2022-09-03 NOTE — Patient Instructions (Addendum)
Recheck sputum for regular and fungus cultures Order high resolution CT of the chest and CT head and neck for evaluation of jaw swelling  Reduce CellCept to 2 tablets twice daily for 2 weeks and then 1 tablet twice daily for 2 weeks and then stop Reduce prednisone to 15 mg/day and hold at that dose.  Will send in an additional prescription of 5 mg tablets of prednisone Follow-up in 1 month

## 2022-09-03 NOTE — Telephone Encounter (Signed)
Dolanda with [employer] sent a message stating they would need the 02 testing showing the increase of 02 use

## 2022-09-03 NOTE — Progress Notes (Signed)
Hannah Klein    [id number]    03/12/1975  Primary Care Physician:Clark, Keane Scrape, NP  Referring Physician: Doreene Nest, NP [address] 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 who  has a past medical history of Arthritis, Asthma, Environmental allergies, Esophageal dysphagia, Generalized anxiety disorder, GERD (gastroesophageal reflux disease), History of hiatal hernia, colonic polyps, Hypertension, Lung disorder, Migraines, MRSA (methicillin resistant Staphylococcus aureus), and Sleep apnea.   Sent to the ILD clinic by Dr. Delton Coombes for evaluation of interstitial lung disease.  She has history of severe persistent asthma for many years and is followed by Dr. Dellis Anes.  She had been on tezpire for 4 months in 2023.  Last dose of tezpire is in 2024.  In spite of the biologic she had significant dyspnea and noted to have a D-dimer.  She had a CTA which showed groundglass opacities concerning for pneumonitis/pneumonia and was admitted on 01/25/2022.  Treated with antibiotics, prednisone taper with minimal improvement.  She subsequently had surgical lung biopsy Dr. Cliffton Asters in early January 2024 with pathology showing nonspecific pneumonitis/pulmonary fibrosis.  Workup in the past shows borderline elevated rheumatoid factor and she has rheumatology evaluation pending for later this year.  Currently on 30 mg of prednisone which is started on March 29, 2022 with some improvement in symptoms  She is currently on performist and Pulmicort nebulizers.  History also notable for hiatal hernia, esophageal dysphagia and she follows with Dr. Marina Goodell.  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 Consulted with Dr. 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  No feather pillows or comforters ILD questionnaire 04/18/2022-negative Smoking history: 36-pack-year smoker.  Quit in 2009 Travel history: Originally from [place].  No significant recent travel Relevant family history: Brother does have asthma  Interim history: Continues on CellCept at 1.5 g/day Continues on prednisone at 20 mg bid Rituxan has been denied in spite of multiple appeals Cytoxan was initiated as her ILD was felt to be rapidly progressive and she got her second dose this week  She had a second opinion at [facility] who have agreed this therapy.  Outpatient Encounter Medications as of 09/03/2022  Medication Sig   acetaminophen (TYLENOL) 500 MG tablet Take 2 tablets (1,000 mg total) by mouth every 6 (six) hours as needed.   albuterol (VENTOLIN HFA) 108 (90 Base) MCG/ACT inhaler INHALE 1-2 PUFFS BY MOUTH EVERY 6 HOURS AS NEEDED FOR WHEEZE OR SHORTNESS OF BREATH   B Complex-C (B-COMPLEX WITH VITAMIN C) tablet Take 1 tablet by mouth daily.   budesonide (PULMICORT) 0.25 MG/2ML nebulizer solution Take 0.25 mg by nebulization 2 (two) times daily.   Cholecalciferol (VITAMIN D3) 125 MCG (5000 UT) TABS Take 5,000 Units by mouth daily.   clobetasol ointment (TEMOVATE) 0.05 % Apply 1 Application topically 2 (two) times daily.   dapsone 25 MG tablet TAKE 2 TABLETS BY MOUTH 2 TIMES DAILY.   diclofenac  Sodium (VOLTAREN ARTHRITIS PAIN) 1 % GEL Apply 2 g topically daily as needed (Chest pain).   esomeprazole (NEXIUM) 40 MG capsule Take 1 capsule (40 mg total) by mouth 2 (two) times daily. For heartburn.   famotidine  (PEPCID) 40 MG tablet Take 1 tablet (40 mg total) by mouth 2 (two) times daily. for heartburn.   formoterol (PERFOROMIST) 20 MCG/2ML nebulizer solution Take 20 mcg by nebulization 2 (two) times daily.   hydrOXYzine (VISTARIL) 50 MG capsule TAKE 1 CAPSULE BY MOUTH ONCE OR TWICE DAILY AS NEEDED FOR ANXIETY   levocetirizine (XYZAL) 5 MG tablet Take 1 tablet (5 mg total) by mouth 2 (two) times daily. For allergies   mometasone-formoterol (DULERA) 200-5 MCG/ACT AERO Inhale 2 puffs into the lungs every 12 (twelve) hours.   Multiple Vitamin (MULTIVITAMIN) capsule Take 1 capsule by mouth daily.   mycophenolate (CELLCEPT) 500 MG tablet Take 3 tablets (1,500 mg total) by mouth 2 (two) times daily.   Omega-3 Fatty Acids (OMEGA-3 FISH OIL) 300 MG CAPS Take 1,000 mg by mouth.   ondansetron (ZOFRAN) 4 MG tablet Take 1 tablet (4 mg total) by mouth every 8 (eight) hours as needed for nausea or vomiting.   oxyCODONE-acetaminophen (PERCOCET) 7.5-325 MG tablet Take 1 tablet by mouth every 6 (six) hours as needed for up to 20 doses for severe pain.   predniSONE (DELTASONE) 10 MG tablet Take 2 tabs (20mg )  Twice daily   rosuvastatin (CRESTOR) 10 MG tablet TAKE 1 TABLET BY MOUTH EVERY DAY FOR CHOLESTEROL   sucralfate (CARAFATE) 1 g tablet Take 1 tablet (1 g total) by mouth in the morning, at noon, and at bedtime.   vitamin B-12 (CYANOCOBALAMIN) 1000 MCG tablet Take 1,000 mcg by mouth daily.   vitamin C (ASCORBIC ACID) 500 MG tablet Take 500 mg by mouth daily.   vitamin E 180 MG (400 UNITS) capsule Take 400 Units by mouth daily.   formoterol (PERFOROMIST) 20 MCG/2ML nebulizer solution Take 2 mLs (20 mcg total) by nebulization 2 (two) times daily. (Patient taking differently: Take 20 mcg by nebulization daily as needed (Shortness of breath).)   [DISCONTINUED] tirzepatide (ZEPBOUND) 2.5 MG/0.5ML Pen Inject 2.5 mg into the skin once a week.   No facility-administered encounter medications on file as of 09/03/2022.    Physical Exam: Blood pressure 130/66, pulse 74, temperature 98 F (36.7 C), temperature source Oral, height 5\' 9"  (1.753 m), weight 235 lb 9.6 oz (106.9 kg), last menstrual period 07/31/2022, SpO2 93%. Gen:      No acute distress HEENT:  EOMI, sclera anicteric Neck:     No masses; no thyromegaly Lungs:    Clear to auscultation bilaterally; normal respiratory effort CV:         Regular rate and rhythm; no murmurs Abd:      + bowel sounds; soft, non-tender; no palpable masses, no distension Ext:    No edema; adequate peripheral perfusion Skin:      Warm and dry; no rash Neuro: alert and oriented x 3 Psych: normal mood and affect   Data Reviewed: Imaging: High resolution CT 04/11/2020-clear lungs with 4 mm lower lobe nodule Super D CT 04/07/2021-patchy groundglass opacities in the mid to lower lungs.  Stable lung nodules CTA 01/25/2022-no PE, moderate bilateral peripheral predominant groundglass opacities CT chest 02/22/2022-progressive interstitial lung disease with patchy groundglass, subpleural reticulation, traction bronchiectasis bilaterally with basal gradient.  Probable UIP pattern High resolution CT 04/25/2022-increasingly organized return of groundglass opacities with subpleural reticular densities, traction bronchiectasis.  Indeterminate for UIP I have reviewed the images personally.  PFTs: 03/07/2022 FVC 1.77 [41%], FEV1 1.60 [47%], F/F91, DLCO 7.03 [28%] Severe restrictive physiology.  Severe diffusion impairment.  Labs: Rheumatoid factor 01/25/2022- 41 ANA 01/25/2022-negative CCP 01/26/2022 less than 1 ANCA 08/03/2021-negative  CTD serologies 04/18/2022 ANA 1: 1280 ENA RNP 1.2, SSA greater than 200  Surgical lung biopsy 03/08/2022 No unclassifiable interstitial, chronic fibroinflammatory process.  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  Due to rapid progression on CT scan and pathology findings with fibroblastic foci we felt that the phenotype is aggressive and recommended aggressive immunotherapy and addition of antifibrotic therapy.  Continue CellCept dose to 1.5 g twice daily, continue prednisone at 20 mg/day Also ordered Rituxan injections there are recent reports that Rituxan plus CellCept shows improvement in connective tissue disease related ILD (European Respiratory Journal 2023; DOI: 10.1183/13993003.02071-2022).   Unfortunately Rituxan has been denied in spite of multiple appeals.  As she has aggressive disease with rapid worsening we have on Cytoxan. We applied for Ofev but has been denied by insurance.  This is temporarily on hold until we can get her immunosuppression optimized  Since she has received 2 doses of CellCept already we will start tapering some of her other medications.  I will reduce CellCept over a period of 1 month.  Reduce prednisone to 15 mg and hold at that dose We can attempt azathioprine instead CellCept in the future.  TPMT levels are normal  Continue [facility]  Continue 2 L oxygen during daytime and at night with CPAP.  Order portable concentrator  Neck pain She is complaining of neck and jaw pain after her most recent Cytoxan infusion. We will get CT neck.  Leukocytosis Labs reviewed with elevation in WBC count.  She does not have any symptoms of infection and will continue to  monitor this. Sputum cultures are done for some reason at [facility] recently and they are growing fungus.  I am not sure the significance of this.  Asked recheck sputum cultures and a CT chest.  If abnormal then will do a bronchoscopy with BAL.  Severe persistent asthma Off tezspire since November 2024 Continue Dulera.  She prefers the inhaler over to performist and Pulmicort nebulizer  OSA Continue CPAP with oxygen  Plan/Recommendations: Follow labs for monitoring CellCept 1.5 g twice daily, Cytoxan Taper CellCept, reduce prednisone to 15 mg CT chest, head and neck  Chilton Greathouse MD [facility] 09/03/2022, 9:28 AM  CC: Doreene Nest, NP

## 2022-09-04 ENCOUNTER — Encounter (HOSPITAL_COMMUNITY)
Admission: RE | Admit: 2022-09-04 | Discharge: 2022-09-04 | Disposition: A | Discharge status: Home / Self Care | Payer: Commercial Managed Care - HMO | Source: Ambulatory Visit | Attending: Pulmonary Disease | Admitting: Pulmonary Disease

## 2022-09-04 VITALS — Wt 237.9 lb

## 2022-09-04 DIAGNOSIS — G622 Polyneuropathy due to other toxic agents: Secondary | ICD-10-CM | POA: Insufficient documentation

## 2022-09-04 DIAGNOSIS — H0289 Other specified disorders of eyelid: Secondary | ICD-10-CM | POA: Diagnosis present

## 2022-09-04 DIAGNOSIS — Z79899 Other long term (current) drug therapy: Secondary | ICD-10-CM | POA: Insufficient documentation

## 2022-09-04 DIAGNOSIS — R231 Pallor: Secondary | ICD-10-CM | POA: Insufficient documentation

## 2022-09-04 DIAGNOSIS — M3502 Sicca syndrome with lung involvement: Secondary | ICD-10-CM | POA: Diagnosis present

## 2022-09-04 DIAGNOSIS — J849 Interstitial pulmonary disease, unspecified: Secondary | ICD-10-CM | POA: Diagnosis present

## 2022-09-04 NOTE — Progress Notes (Signed)
Daily Session Note  Patient Details  Name: Hannah Klein MRN: [id number] Date of Birth: [birth year] Referring Provider:   Doristine Devoid Pulmonary Rehab Walk Test from 08/15/2022 in [facility]  Referring Provider Soskis  [sees [name]]       Encounter Date: 09/04/2022  Check In:  Session Check In - 09/04/22 1031       Check-In   Supervising physician immediately available to respond to emergencies [facility] MD immediately available    Physician(s) Carlyon Shadow, NP    Location [facility]    Staff Present Essie Hart, RN, BSN;Andrius Andrepont Idelle Crouch BS, ACSM-CEP, Exercise Physiologist;Kaylee Earlene Plater, MS, ACSM-CEP, Exercise Physiologist;Casey Hermine Messick Belarus, RD, LDN    Virtual Visit No    Medication changes reported     Yes    Comments Prednisone decreased to 15mg  daily, Cellcept is being tapered down    Fall or balance concerns reported    No    Tobacco Cessation No Change    Warm-up and Cool-down Performed as group-led instruction    Resistance Training Performed Yes    VAD Patient? No    PAD/SET Patient? No      Pain Assessment   Currently in Pain? No/denies    Multiple Pain Sites No             Capillary Blood Glucose: No results found for this or any previous visit (from the past 24 hour(s)).   Exercise Prescription Changes - 09/04/22 1200       Response to Exercise   Blood Pressure (Admit) 122/70    Blood Pressure (Exercise) 146/60    Blood Pressure (Exit) 108/66    Heart Rate (Admit) 75 bpm    Heart Rate (Exercise) 105 bpm    Heart Rate (Exit) 81 bpm    Oxygen Saturation (Admit) 96 %    Oxygen Saturation (Exercise) 92 %    Oxygen Saturation (Exit) 97 %    Rating of Perceived Exertion (Exercise) 13    Perceived Dyspnea (Exercise) 2    Duration Progress to 30 minutes of  aerobic without signs/symptoms of physical distress    Intensity THRR unchanged      Progression    Progression Continue to progress workloads to maintain intensity without signs/symptoms of physical distress.    Average METs 2.5      Resistance Training   Training Prescription Yes    Weight blue bands    Reps 10-15    Time 10 Minutes      Interval Training   Interval Training No      Oxygen   Oxygen Continuous    Liters 10      Treadmill   MPH 2    Grade 0    Minutes 15    METs 2.53      Recumbant Elliptical   Level 2    Minutes 15    METs 2.5      Oxygen   Maintain Oxygen Saturation 88% or higher             Social History   Tobacco Use  Smoking Status Former   Packs/day: 2.00   Years: 18.00   Additional pack years: 0.00   Total pack years: 36.00   Types: Cigarettes   Start date: 50   Quit date: 03/16/2007   Years since quitting: 15.4   Passive exposure: Current  Smokeless Tobacco Never    Goals Met:  Independence  with exercise equipment Exercise tolerated well No report of concerns or symptoms today Strength training completed today  Goals Unmet:  Not Applicable  Comments: Service time is from 1020 to 1144.    Dr. Mechele Collin is Medical Director for Pulmonary Rehab at [facility].

## 2022-09-04 NOTE — Telephone Encounter (Signed)
6 min walk O2 walk completed at [facility]- 08/08/22. Results also added to snap shot in chart.                                             Test Data  Resting Room Air O2 saturation by oximetry  94 %  If O2 sat <89%, supplemental O2 started with 2 LPM increments to  maintain resting saturation  above 88% prior to testing.  Testing started @ 4 LPM   Walk Time O2/lpm SpO2 HR Comments  Start   0 94 78      4 92 101   2:15   4 88 111 INCREASED TO 6 LPM [place]  3:15   6 94 100   4:15  6 88 112 INCREASED TO 8 LPM [address]  5:15   8 97 96   6:15   8 91 119   7:15   8 89 112                                    Total Walk Time:    7 Min     15 Sec   Patient maintained O2 saturations above 88% on    8 LPM for   3  Min  Patient resting HR   78 bpm        Max HR during exercise  112 bpm    65 % pred  End of Test O2 saturation  89 %   [place] Dyspnea Scale (Perceived Breathlessness):  7   Therapists Comments// Physical limitations unrelated to dyspnea  or fatigue:  SIX MINUTE WALK CONVERTED TO OXYGEN TITRATION  PATRICK K DIXON,RCP

## 2022-09-05 ENCOUNTER — Encounter: Payer: Commercial Managed Care - HMO | Admitting: Rheumatology

## 2022-09-05 NOTE — Progress Notes (Signed)
Pulmonary Individual Treatment Plan  Patient Details  Name: Hannah Klein MRN: [id number] Date of Birth: 06/09/1975 Referring Provider:   Doristine Devoid Pulmonary Rehab Walk Test from 08/15/2022 in [facility]  Referring Provider Soskis  [sees [name]]       Initial Encounter Date:  Flowsheet Row Pulmonary Rehab Walk Test from 08/15/2022 in [facility]  Date 08/15/22       Visit Diagnosis: Interstitial lung disease (HCC)  Patient's Home Medications on Admission:   Current Outpatient Medications:    acetaminophen (TYLENOL) 500 MG tablet, Take 2 tablets (1,000 mg total) by mouth every 6 (six) hours as needed., Disp: 30 tablet, Rfl: 0   albuterol (VENTOLIN HFA) 108 (90 Base) MCG/ACT inhaler, INHALE 1-2 PUFFS BY MOUTH EVERY 6 HOURS AS NEEDED FOR WHEEZE OR SHORTNESS OF BREATH, Disp: 18 g, Rfl: 1   B Complex-C (B-COMPLEX WITH VITAMIN C) tablet, Take 1 tablet by mouth daily., Disp: , Rfl:    budesonide (PULMICORT) 0.25 MG/2ML nebulizer solution, Take 0.25 mg by nebulization 2 (two) times daily., Disp: , Rfl:    Cholecalciferol (VITAMIN D3) 125 MCG (5000 UT) TABS, Take 5,000 Units by mouth daily., Disp: , Rfl:    clobetasol ointment (TEMOVATE) 0.05 %, Apply 1 Application topically 2 (two) times daily., Disp: 30 g, Rfl: 0   dapsone 25 MG tablet, TAKE 2 TABLETS BY MOUTH 2 TIMES DAILY., Disp: 360 tablet, Rfl: 1   diclofenac Sodium (VOLTAREN ARTHRITIS PAIN) 1 % GEL, Apply 2 g topically daily as needed (Chest pain)., Disp: , Rfl:    esomeprazole (NEXIUM) 40 MG capsule, Take 1 capsule (40 mg total) by mouth 2 (two) times daily. For heartburn., Disp: 180 capsule, Rfl: 0   famotidine (PEPCID) 40 MG tablet, Take 1 tablet (40 mg total) by mouth 2 (two) times daily. for heartburn., Disp: 180 tablet, Rfl: 2   formoterol (PERFOROMIST) 20 MCG/2ML nebulizer solution, Take 2 mLs (20 mcg  total) by nebulization 2 (two) times daily. (Patient taking differently: Take 20 mcg by nebulization daily as needed (Shortness of breath).), Disp: 120 mL, Rfl: 1   formoterol (PERFOROMIST) 20 MCG/2ML nebulizer solution, Take 20 mcg by nebulization 2 (two) times daily., Disp: , Rfl:    hydrOXYzine (VISTARIL) 50 MG capsule, TAKE 1 CAPSULE BY MOUTH ONCE OR TWICE DAILY AS NEEDED FOR ANXIETY, Disp: 180 capsule, Rfl: 2   levocetirizine (XYZAL) 5 MG tablet, Take 1 tablet (5 mg total) by mouth 2 (two) times daily. For allergies, Disp: 180 tablet, Rfl: 3   mometasone-formoterol (DULERA) 200-5 MCG/ACT AERO, Inhale 2 puffs into the lungs every 12 (twelve) hours., Disp: 13 g, Rfl: 5   Multiple Vitamin (MULTIVITAMIN) capsule, Take 1 capsule by mouth daily., Disp: , Rfl:    mycophenolate (CELLCEPT) 500 MG tablet, Take 3 tablets (1,500 mg total) by mouth 2 (two) times daily., Disp: 120 tablet, Rfl: 3   Omega-3 Fatty Acids (OMEGA-3 FISH OIL) 300 MG CAPS, Take 1,000 mg by mouth., Disp: , Rfl:    ondansetron (ZOFRAN) 4 MG tablet, Take 1 tablet (4 mg total) by mouth every 8 (eight) hours as needed for nausea or vomiting., Disp: 20 tablet, Rfl: 0   oxyCODONE-acetaminophen (PERCOCET) 7.5-325 MG tablet, Take 1 tablet by mouth every 6 (six) hours as needed for up to 20 doses for severe pain., Disp: 20 tablet, Rfl: 0   predniSONE (DELTASONE) 10 MG tablet, Take  2 tabs (20mg )  Twice daily, Disp: 120 tablet, Rfl: 1   predniSONE (DELTASONE) 5 MG tablet, Take 1 tablet (5 mg total) by mouth daily with breakfast., Disp: 90 tablet, Rfl: 1   rosuvastatin (CRESTOR) 10 MG tablet, TAKE 1 TABLET BY MOUTH EVERY DAY FOR CHOLESTEROL, Disp: 90 tablet, Rfl: 2   sucralfate (CARAFATE) 1 g tablet, Take 1 tablet (1 g total) by mouth in the morning, at noon, and at bedtime., Disp: 90 tablet, Rfl: 5   vitamin B-12 (CYANOCOBALAMIN) 1000 MCG tablet, Take 1,000 mcg by mouth daily., Disp: , Rfl:    vitamin C (ASCORBIC ACID) 500 MG tablet, Take 500 mg  by mouth daily., Disp: , Rfl:    vitamin E 180 MG (400 UNITS) capsule, Take 400 Units by mouth daily., Disp: , Rfl:   Past Medical History: Past Medical History:  Diagnosis Date   Arthritis    Asthma    Environmental allergies    Esophageal dysphagia    Generalized anxiety disorder    GERD (gastroesophageal reflux disease)    History of hiatal hernia    Hx of colonic polyps    Hypertension    Lung disorder    Patient states she has two unidentified ILD   Migraines    MRSA (methicillin resistant Staphylococcus aureus)    Lt upper arm  approx 8 years ago   Sleep apnea     Tobacco Use: Social History   Tobacco Use  Smoking Status Former   Packs/day: 2.00   Years: 18.00   Additional pack years: 0.00   Total pack years: 36.00   Types: Cigarettes   Start date: 36   Quit date: 03/16/2007   Years since quitting: 15.4   Passive exposure: Current  Smokeless Tobacco Never    Labs: Review Flowsheet  More data exists      Latest Ref Rng & Units 04/07/2019 07/07/2019 03/17/2020 06/09/2021 05/31/2022  Labs for ITP Cardiac and Pulmonary Rehab  Cholestrol 0 - 200 mg/dL 161  096  045  409  811   LDL (calc) 0 - 99 mg/dL 78  914  94  - -  Direct LDL mg/dL - - - 782.9  562.1   HDL-C >39.00 mg/dL 30.86  57.84  69.62  95.28  66.80   Trlycerides 0.0 - 149.0 mg/dL 413.2  440.1  027.2  536.6  252.0   Hemoglobin A1c 4.6 - 6.5 % - - 5.5  5.6  5.5     Capillary Blood Glucose: No results found for: "GLUCAP"   Pulmonary Assessment Scores:  Pulmonary Assessment Scores     Row Name 08/15/22 0925         ADL UCSD   ADL Phase Entry     SOB Score total 65       CAT Score   CAT Score 19       mMRC Score   mMRC Score 4             UCSD: Self-administered rating of dyspnea associated with activities of daily living (ADLs) 6-point scale (0 = "not at all" to 5 = "maximal or unable to do because of breathlessness")  Scoring Scores range from 0 to 120.  Minimally important  difference is 5 units  CAT: CAT can identify the health impairment of COPD patients and is better correlated with disease progression.  CAT has a scoring range of zero to 40. The CAT score is classified into four groups of low (less than 10),  medium (10 - 20), high (21-30) and very high (31-40) based on the impact level of disease on health status. A CAT score over 10 suggests significant symptoms.  A worsening CAT score could be explained by an exacerbation, poor medication adherence, poor inhaler technique, or progression of COPD or comorbid conditions.  CAT MCID is 2 points  mMRC: mMRC (Modified Medical Research Council) Dyspnea Scale is used to assess the degree of baseline functional disability in patients of respiratory disease due to dyspnea. No minimal important difference is established. A decrease in score of 1 point or greater is considered a positive change.   Pulmonary Function Assessment:  Pulmonary Function Assessment - 08/15/22 0924       Breath   Shortness of Breath Yes;Limiting activity             Exercise Target Goals: Exercise Program Goal: Individual exercise prescription set using results from initial 6 min walk test and THRR while considering  patient's activity barriers and safety.   Exercise Prescription Goal: Initial exercise prescription builds to 30-45 minutes a day of aerobic activity, 2-3 days per week.  Home exercise guidelines will be given to patient during program as part of exercise prescription that the participant will acknowledge.  Activity Barriers & Risk Stratification:  Activity Barriers & Cardiac Risk Stratification - 08/15/22 0927       Activity Barriers & Cardiac Risk Stratification   Activity Barriers Deconditioning;Muscular Weakness;Shortness of Breath;Arthritis;Neck/Spine Problems;Joint Problems    Comments Rt shoulder bursitis and hip pain             6 Minute Walk:  6 Minute Walk     Row Name 08/15/22 1030         6  Minute Walk   Phase Initial     Distance 1170 feet     Walk Time 6 minutes     # of Rest Breaks 2  2:29-2:40, 4:56-5:10     MPH 2.22     METS 3.55     RPE 11     Perceived Dyspnea  2     VO2 Peak 12.44     Symptoms No     Resting HR 87 bpm     Resting BP 104/62     Resting Oxygen Saturation  96 %     Exercise Oxygen Saturation  during 6 min walk 85 %     Max Ex. HR 107 bpm     Max Ex. BP 134/70     2 Minute Post BP 118/64       Interval HR   1 Minute HR 104     2 Minute HR 107     3 Minute HR 102     4 Minute HR 98     5 Minute HR 97     6 Minute HR 99     2 Minute Post HR 79     Interval Heart Rate? Yes       Interval Oxygen   Interval Oxygen? Yes     Baseline Oxygen Saturation % 96 %     1 Minute Oxygen Saturation % 94 %     1 Minute Liters of Oxygen 8 L     2 Minute Oxygen Saturation % 90 %     2 Minute Liters of Oxygen 8 L     3 Minute Oxygen Saturation % 91 %  2:29 85%     3 Minute Liters of Oxygen 8 L  increased  to 10L     4 Minute Oxygen Saturation % 95 %     4 Minute Liters of Oxygen 10 L     5 Minute Oxygen Saturation % 98 %     5 Minute Liters of Oxygen 10 L     6 Minute Oxygen Saturation % 94 %     6 Minute Liters of Oxygen 10 L     2 Minute Post Oxygen Saturation % 99 %     2 Minute Post Liters of Oxygen 8 L              Oxygen Initial Assessment:  Oxygen Initial Assessment - 08/15/22 0923       Home Oxygen   Home Oxygen Device E-Tanks;Home Concentrator    Sleep Oxygen Prescription Continuous    Liters per minute 4    Home Exercise Oxygen Prescription Continuous    Liters per minute 8    Home Resting Oxygen Prescription Continuous    Liters per minute 2    Compliance with Home Oxygen Use Yes      Initial 6 min Walk   Oxygen Used Continuous    Liters per minute 8      Program Oxygen Prescription   Program Oxygen Prescription Continuous    Liters per minute 8      Intervention   Short Term Goals To learn and exhibit compliance with  exercise, home and travel O2 prescription;To learn and understand importance of maintaining oxygen saturations>88%;To learn and demonstrate proper use of respiratory medications;To learn and understand importance of monitoring SPO2 with pulse oximeter and demonstrate accurate use of the pulse oximeter.;To learn and demonstrate proper pursed lip breathing techniques or other breathing techniques.     Long  Term Goals Exhibits compliance with exercise, home  and travel O2 prescription;Verbalizes importance of monitoring SPO2 with pulse oximeter and return demonstration;Maintenance of O2 saturations>88%;Exhibits proper breathing techniques, such as pursed lip breathing or other method taught during program session;Compliance with respiratory medication;Demonstrates proper use of MDI's             Oxygen Re-Evaluation:  Oxygen Re-Evaluation     Row Name 08/31/22 1015             Program Oxygen Prescription   Program Oxygen Prescription Continuous       Liters per minute 8         Home Oxygen   Home Oxygen Device E-Tanks;Home Concentrator       Sleep Oxygen Prescription Continuous       Liters per minute 4       Home Exercise Oxygen Prescription Continuous       Liters per minute 8       Home Resting Oxygen Prescription Continuous       Liters per minute 2       Compliance with Home Oxygen Use Yes         Goals/Expected Outcomes   Short Term Goals To learn and exhibit compliance with exercise, home and travel O2 prescription;To learn and understand importance of maintaining oxygen saturations>88%;To learn and demonstrate proper use of respiratory medications;To learn and understand importance of monitoring SPO2 with pulse oximeter and demonstrate accurate use of the pulse oximeter.;To learn and demonstrate proper pursed lip breathing techniques or other breathing techniques.        Long  Term Goals Exhibits compliance with exercise, home  and travel O2 prescription;Verbalizes importance  of monitoring SPO2 with pulse oximeter and return demonstration;Maintenance  of O2 saturations>88%;Exhibits proper breathing techniques, such as pursed lip breathing or other method taught during program session;Compliance with respiratory medication;Demonstrates proper use of MDI's       Goals/Expected Outcomes Compliance and understanding of oxygen saturation monitoring and breathing techniques to decrease shortness of breath.                Oxygen Discharge (Final Oxygen Re-Evaluation):  Oxygen Re-Evaluation - 08/31/22 1015       Program Oxygen Prescription   Program Oxygen Prescription Continuous    Liters per minute 8      Home Oxygen   Home Oxygen Device E-Tanks;Home Concentrator    Sleep Oxygen Prescription Continuous    Liters per minute 4    Home Exercise Oxygen Prescription Continuous    Liters per minute 8    Home Resting Oxygen Prescription Continuous    Liters per minute 2    Compliance with Home Oxygen Use Yes      Goals/Expected Outcomes   Short Term Goals To learn and exhibit compliance with exercise, home and travel O2 prescription;To learn and understand importance of maintaining oxygen saturations>88%;To learn and demonstrate proper use of respiratory medications;To learn and understand importance of monitoring SPO2 with pulse oximeter and demonstrate accurate use of the pulse oximeter.;To learn and demonstrate proper pursed lip breathing techniques or other breathing techniques.     Long  Term Goals Exhibits compliance with exercise, home  and travel O2 prescription;Verbalizes importance of monitoring SPO2 with pulse oximeter and return demonstration;Maintenance of O2 saturations>88%;Exhibits proper breathing techniques, such as pursed lip breathing or other method taught during program session;Compliance with respiratory medication;Demonstrates proper use of MDI's    Goals/Expected Outcomes Compliance and understanding of oxygen saturation monitoring and breathing  techniques to decrease shortness of breath.             Initial Exercise Prescription:  Initial Exercise Prescription - 08/15/22 1000       Date of Initial Exercise RX and Referring Provider   Date 08/15/22    Referring Provider Soskis   sees [name]   Expected Discharge Date 11/08/22      Oxygen   Oxygen Continuous    Liters 8      Treadmill   MPH 2    Grade 0    Minutes 15    METs 2.53      Recumbant Elliptical   Level 1    RPM 30    Watts 70    Minutes 15    METs 2.3      Prescription Details   Frequency (times per week) 2    Duration Progress to 30 minutes of continuous aerobic without signs/symptoms of physical distress      Intensity   THRR 40-80% of Max Heartrate 69-138    Ratings of Perceived Exertion 11-13    Perceived Dyspnea 0-4      Progression   Progression Continue to progress workloads to maintain intensity without signs/symptoms of physical distress.      Resistance Training   Training Prescription Yes    Weight blue bands    Reps 10-15             Perform Capillary Blood Glucose checks as needed.  Exercise Prescription Changes:   Exercise Prescription Changes     Row Name 08/21/22 1200 09/04/22 1200           Response to Exercise   Blood Pressure (Admit) 114/66 122/70      Blood  Pressure (Exercise) 140/66 146/60      Blood Pressure (Exit) 102/64 108/66      Heart Rate (Admit) 75 bpm 75 bpm      Heart Rate (Exercise) 110 bpm 105 bpm      Heart Rate (Exit) 80 bpm 81 bpm      Oxygen Saturation (Admit) 98 % 96 %      Oxygen Saturation (Exercise) 92 % 92 %      Oxygen Saturation (Exit) 97 % 97 %      Rating of Perceived Exertion (Exercise) 15 13      Perceived Dyspnea (Exercise) 2 2      Symptoms DOE --      Duration Progress to 30 minutes of  aerobic without signs/symptoms of physical distress Progress to 30 minutes of  aerobic without signs/symptoms of physical distress      Intensity THRR unchanged THRR unchanged         Progression   Progression Continue to progress workloads to maintain intensity without signs/symptoms of physical distress. Continue to progress workloads to maintain intensity without signs/symptoms of physical distress.      Average METs 2.3 2.5        Resistance Training   Training Prescription Yes Yes      Weight blue bands blue bands      Reps 10-15 10-15      Time -- 10 Minutes        Interval Training   Interval Training -- No        Oxygen   Oxygen Continuous Continuous      Liters 10 10        Treadmill   MPH 1.8 2      Grade 0 0      Minutes 15 15      METs 2.38 2.53        Recumbant Elliptical   Level 2 2      RPM 40 --      Minutes 15 15      METs 2.1 2.5        Oxygen   Maintain Oxygen Saturation 88% or higher 88% or higher               Exercise Comments:   Exercise Comments     Row Name 08/21/22 1155           Exercise Comments Pt completed first day of group exercise. She exercised on the recumbent elliptical for 15 min, level2, METs 2.1. She then walked on the TM for 15 min, speed 1.8 for 12 min, 1.2 mph for 3 min. Tolerated fair. She tolerated warm up and cool down well, performed squats. Discussed METs. Pt is generally overwhelmed by her diagnosis but is managing.                Exercise Goals and Review:   Exercise Goals     Row Name 08/15/22 0926 08/31/22 1011           Exercise Goals   Increase Physical Activity Yes Yes      Intervention Provide advice, education, support and counseling about physical activity/exercise needs.;Develop an individualized exercise prescription for aerobic and resistive training based on initial evaluation findings, risk stratification, comorbidities and participant's personal goals. Provide advice, education, support and counseling about physical activity/exercise needs.;Develop an individualized exercise prescription for aerobic and resistive training based on initial evaluation findings, risk  stratification, comorbidities and participant's personal goals.  Expected Outcomes Short Term: Attend rehab on a regular basis to increase amount of physical activity.;Long Term: Exercising regularly at least 3-5 days a week.;Long Term: Add in home exercise to make exercise part of routine and to increase amount of physical activity. Short Term: Attend rehab on a regular basis to increase amount of physical activity.;Long Term: Exercising regularly at least 3-5 days a week.;Long Term: Add in home exercise to make exercise part of routine and to increase amount of physical activity.      Increase Strength and Stamina Yes Yes      Intervention Provide advice, education, support and counseling about physical activity/exercise needs.;Develop an individualized exercise prescription for aerobic and resistive training based on initial evaluation findings, risk stratification, comorbidities and participant's personal goals. Provide advice, education, support and counseling about physical activity/exercise needs.;Develop an individualized exercise prescription for aerobic and resistive training based on initial evaluation findings, risk stratification, comorbidities and participant's personal goals.      Expected Outcomes Short Term: Increase workloads from initial exercise prescription for resistance, speed, and METs.;Short Term: Perform resistance training exercises routinely during rehab and add in resistance training at home;Long Term: Improve cardiorespiratory fitness, muscular endurance and strength as measured by increased METs and functional capacity ( ) Short Term: Increase workloads from initial exercise prescription for resistance, speed, and METs.;Short Term: Perform resistance training exercises routinely during rehab and add in resistance training at home;Long Term: Improve cardiorespiratory fitness, muscular endurance and strength as measured by increased METs and functional capacity ( )       Able to understand and use rate of perceived exertion (RPE) scale Yes Yes      Intervention Provide education and explanation on how to use RPE scale Provide education and explanation on how to use RPE scale      Expected Outcomes Short Term: Able to use RPE daily in rehab to express subjective intensity level;Long Term:  Able to use RPE to guide intensity level when exercising independently Short Term: Able to use RPE daily in rehab to express subjective intensity level;Long Term:  Able to use RPE to guide intensity level when exercising independently      Able to understand and use Dyspnea scale Yes Yes      Intervention Provide education and explanation on how to use Dyspnea scale Provide education and explanation on how to use Dyspnea scale      Expected Outcomes Short Term: Able to use Dyspnea scale daily in rehab to express subjective sense of shortness of breath during exertion;Long Term: Able to use Dyspnea scale to guide intensity level when exercising independently Short Term: Able to use Dyspnea scale daily in rehab to express subjective sense of shortness of breath during exertion;Long Term: Able to use Dyspnea scale to guide intensity level when exercising independently      Knowledge and understanding of Target Heart Rate Range (THRR) Yes Yes      Intervention Provide education and explanation of THRR including how the numbers were predicted and where they are located for reference Provide education and explanation of THRR including how the numbers were predicted and where they are located for reference      Expected Outcomes Short Term: Able to state/look up THRR;Long Term: Able to use THRR to govern intensity when exercising independently;Short Term: Able to use daily as guideline for intensity in rehab Short Term: Able to state/look up THRR;Long Term: Able to use THRR to govern intensity when exercising independently;Short Term: Able to use daily as guideline  for intensity in rehab       Understanding of Exercise Prescription Yes Yes      Intervention Provide education, explanation, and written materials on patient's individual exercise prescription Provide education, explanation, and written materials on patient's individual exercise prescription      Expected Outcomes Short Term: Able to explain program exercise prescription;Long Term: Able to explain home exercise prescription to exercise independently Short Term: Able to explain program exercise prescription;Long Term: Able to explain home exercise prescription to exercise independently               Exercise Goals Re-Evaluation :  Exercise Goals Re-Evaluation     Row Name 08/31/22 1011             Exercise Goal Re-Evaluation   Exercise Goals Review Increase Physical Activity;Able to understand and use Dyspnea scale;Understanding of Exercise Prescription;Increase Strength and Stamina;Knowledge and understanding of Target Heart Rate Range (THRR);Able to understand and use rate of perceived exertion (RPE) scale       Comments Hannah Klein has completed 2 exercise sessions. She exercises for 15 min on the recumbent elliptical and treadmill. She averages 2.5 METs at level 2 on the recumbent elliptical and 2.53 METs at 2 mph and 0% incline. She performs the warmup and cooldown standing without limitations. It is too soon to note any discernable progressions. Will continue to monitor and progress as able.       Expected Outcomes Through exercise at rehab and home, the patient will decrease shortness of breath with daily activities and feel confident in carrying out an exercise regimen at home.                Discharge Exercise Prescription (Final Exercise Prescription Changes):  Exercise Prescription Changes - 09/04/22 1200       Response to Exercise   Blood Pressure (Admit) 122/70    Blood Pressure (Exercise) 146/60    Blood Pressure (Exit) 108/66    Heart Rate (Admit) 75 bpm    Heart Rate (Exercise) 105 bpm     Heart Rate (Exit) 81 bpm    Oxygen Saturation (Admit) 96 %    Oxygen Saturation (Exercise) 92 %    Oxygen Saturation (Exit) 97 %    Rating of Perceived Exertion (Exercise) 13    Perceived Dyspnea (Exercise) 2    Duration Progress to 30 minutes of  aerobic without signs/symptoms of physical distress    Intensity THRR unchanged      Progression   Progression Continue to progress workloads to maintain intensity without signs/symptoms of physical distress.    Average METs 2.5      Resistance Training   Training Prescription Yes    Weight blue bands    Reps 10-15    Time 10 Minutes      Interval Training   Interval Training No      Oxygen   Oxygen Continuous    Liters 10      Treadmill   MPH 2    Grade 0    Minutes 15    METs 2.53      Recumbant Elliptical   Level 2    Minutes 15    METs 2.5      Oxygen   Maintain Oxygen Saturation 88% or higher             Nutrition:  Target Goals: Understanding of nutrition guidelines, daily intake of sodium 1500mg , cholesterol 200mg , calories 30% from fat and 7% or less from saturated  fats, daily to have 5 or more servings of fruits and vegetables.  Biometrics:    Nutrition Therapy Plan and Nutrition Goals:  Nutrition Therapy & Goals - 08/21/22 1055       Nutrition Therapy   Diet heart healthy diet    Drug/Food Interactions Statins/Certain Fruits      Personal Nutrition Goals   Nutrition Goal Patient to improve diet quality by using the plate method as a guide for meal planning to include lean protein/plant protein, fruits, vegetables, whole grains, nonfat dairy as part of a well-balanced diet.    Personal Goal #2 Patient to identify strategies for weight loss of 0.5-2.0# per week.    Personal Goal #3 Patient to identify food sources and limit daily intake of saturated fat, tran fat, sodium, and refined carbohdyrates.    Comments Hannah Klein is currently working toward lung transplant through [facility]. Per documenation, 6/13,  she is working toward an initial weight loss goal of 203# and ultimatley a BMI of 27-28. She reports that multiple weight loss drugs including wegovy have been denied by her insurance company. She is taking prednisone. She has not started making many dietary changes; she continues snacking on pork rinds and twizzlers and eats out 1-2x per week including sweet tea at these meals. Patient will benefit from participation in pulmonary rehab for nutrition, exercise, and lifestyle modification.      Intervention Plan   Intervention Prescribe, educate and counsel regarding individualized specific dietary modifications aiming towards targeted core components such as weight, hypertension, lipid management, diabetes, heart failure and other comorbidities.;Nutrition handout(s) given to patient.    Expected Outcomes Short Term Goal: Understand basic principles of dietary content, such as calories, fat, sodium, cholesterol and nutrients.;Long Term Goal: Adherence to prescribed nutrition plan.             Nutrition Assessments:  MEDIFICTS Score Key: ?70 Need to make dietary changes  40-70 Heart Healthy Diet ? 40 Therapeutic Level Cholesterol Diet   Picture Your Plate Scores: <40 Unhealthy dietary pattern with much room for improvement. 41-50 Dietary pattern unlikely to meet recommendations for good health and room for improvement. 51-60 More healthful dietary pattern, with some room for improvement.  >60 Healthy dietary pattern, although there may be some specific behaviors that could be improved.    Nutrition Goals Re-Evaluation:  Nutrition Goals Re-Evaluation     Row Name 08/21/22 1055             Goals   Current Weight 235 lb 7.2 oz (106.8 kg)       Comment A1c WNL, cholesterol 247, triglycerides 252       Expected Outcome Hannah Klein is currently working toward lung transplant through [facility]. Per documenation, 6/13, she is working toward an initial weight loss goal of 203# and ultimatley a BMI  of 27-28. She reports that multiple weight loss drugs including wegovy have been denied by her insurance company. She is taking prednisone. She has not started making many dietary changes; she continues snacking on pork rinds and twizzlers and eats out 1-2x per week including sweet tea at these meals. Patient will benefit from participation in pulmonary rehab for nutrition, exercise, and lifestyle modification.                Nutrition Goals Discharge (Final Nutrition Goals Re-Evaluation):  Nutrition Goals Re-Evaluation - 08/21/22 1055       Goals   Current Weight 235 lb 7.2 oz (106.8 kg)    Comment A1c WNL, cholesterol  247, triglycerides 252    Expected Outcome Hannah Klein is currently working toward lung transplant through [facility]. Per documenation, 6/13, she is working toward an initial weight loss goal of 203# and ultimatley a BMI of 27-28. She reports that multiple weight loss drugs including wegovy have been denied by her insurance company. She is taking prednisone. She has not started making many dietary changes; she continues snacking on pork rinds and twizzlers and eats out 1-2x per week including sweet tea at these meals. Patient will benefit from participation in pulmonary rehab for nutrition, exercise, and lifestyle modification.             Psychosocial: Target Goals: Acknowledge presence or absence of significant depression and/or stress, maximize coping skills, provide positive support system. Participant is able to verbalize types and ability to use techniques and skills needed for reducing stress and depression.  Initial Review & Psychosocial Screening:  Initial Psych Review & Screening - 08/15/22 4098       Initial Review   Current issues with Current Anxiety/Panic    Comments Pt is on medication for anixety. Feels that anxiety is controlled.      Family Dynamics   Good Support System? Yes    Comments mother, 3 brothers, step mother, and friends      Barriers    Psychosocial barriers to participate in program There are no identifiable barriers or psychosocial needs.      Screening Interventions   Interventions Encouraged to exercise             Quality of Life Scores:  Scores of 19 and below usually indicate a poorer quality of life in these areas.  A difference of  2-3 points is a clinically meaningful difference.  A difference of 2-3 points in the total score of the Quality of Life Index has been associated with significant improvement in overall quality of life, self-image, physical symptoms, and general health in studies assessing change in quality of life.  PHQ-9: Review Flowsheet       08/15/2022 08/14/2022 06/09/2021  Depression screen PHQ 2/9  Decreased Interest 1 1 1   Down, Depressed, Hopeless 0 0 0  PHQ - 2 Score 1 1 1   Altered sleeping 0 - 1  Tired, decreased energy 0 - 1  Change in appetite 1 - 0  Feeling bad or failure about yourself  0 - 0  Trouble concentrating 0 - 0  Moving slowly or fidgety/restless 0 - 0  Suicidal thoughts 0 - 0  PHQ-9 Score 2 - 3  Difficult doing work/chores Not difficult at all - Not difficult at all   Interpretation of Total Score  Total Score Depression Severity:  1-4 = Minimal depression, 5-9 = Mild depression, 10-14 = Moderate depression, 15-19 = Moderately severe depression, 20-27 = Severe depression   Psychosocial Evaluation and Intervention:  Psychosocial Evaluation - 08/15/22 0919       Psychosocial Evaluation & Interventions   Interventions Encouraged to exercise with the program and follow exercise prescription    Comments Hannah Klein denies any psychosocial barriers to participating in rehab. Will continue to monitor.    Expected Outcomes For Hannah Klein to participate in rehab free of psychosocial barriers.    Continue Psychosocial Services  No Follow up required             Psychosocial Re-Evaluation:  Psychosocial Re-Evaluation     Row Name 09/03/22 0919              Psychosocial Re-Evaluation  Current issues with None Identified       Comments Hannah Klein denies any new psychosocial barriers or concerns at this time.       Expected Outcomes For Hannah Klein to continue participating in PR free of any psychosocial barriers or concerns       Interventions Encouraged to attend Pulmonary Rehabilitation for the exercise       Continue Psychosocial Services  No Follow up required                Psychosocial Discharge (Final Psychosocial Re-Evaluation):  Psychosocial Re-Evaluation - 09/03/22 0919       Psychosocial Re-Evaluation   Current issues with None Identified    Comments Hannah Klein denies any new psychosocial barriers or concerns at this time.    Expected Outcomes For Hannah Klein to continue participating in PR free of any psychosocial barriers or concerns    Interventions Encouraged to attend Pulmonary Rehabilitation for the exercise    Continue Psychosocial Services  No Follow up required             Education: Education Goals: Education classes will be provided on a weekly basis, covering required topics. Participant will state understanding/return demonstration of topics presented.  Learning Barriers/Preferences:  Learning Barriers/Preferences - 08/15/22 0920       Learning Barriers/Preferences   Learning Barriers Sight   wears glasses   Learning Preferences Group Instruction;Individual Instruction;Skilled Demonstration             Education Topics: Introduction to Pulmonary Rehab Group instruction provided by PowerPoint, verbal discussion, and written material to support subject matter. Instructor reviews what Pulmonary Rehab is, the purpose of the program, and how patients are referred.     Know Your Numbers Group instruction that is supported by a PowerPoint presentation. Instructor discusses importance of knowing and understanding resting, exercise, and post-exercise oxygen saturation, heart rate, and blood pressure. Oxygen  saturation, heart rate, blood pressure, rating of perceived exertion, and dyspnea are reviewed along with a normal range for these values.    Exercise for the Pulmonary Patient Group instruction that is supported by a PowerPoint presentation. Instructor discusses benefits of exercise, core components of exercise, frequency, duration, and intensity of an exercise routine, importance of utilizing pulse oximetry during exercise, safety while exercising, and options of places to exercise outside of rehab.  Flowsheet Row PULMONARY REHAB OTHER RESPIRATORY from 08/23/2022 in [facility]  Date 08/23/22  Educator EP  Instruction Review Code 1- Verbalizes Understanding          MET Level  Group instruction provided by PowerPoint, verbal discussion, and written material to support subject matter. Instructor reviews what METs are and how to increase METs.    Pulmonary Medications Verbally interactive group education provided by instructor with focus on inhaled medications and proper administration.   Anatomy and Physiology of the Respiratory System Group instruction provided by PowerPoint, verbal discussion, and written material to support subject matter. Instructor reviews respiratory cycle and anatomical components of the respiratory system and their functions. Instructor also reviews differences in obstructive and restrictive respiratory diseases with examples of each.    Oxygen Safety Group instruction provided by PowerPoint, verbal discussion, and written material to support subject matter. There is an overview of "What is Oxygen" and "Why do we need it".  Instructor also reviews how to create a safe environment for oxygen use, the importance of using oxygen as prescribed, and the risks of noncompliance. There is  a brief discussion on traveling with oxygen and resources the patient may utilize.   Oxygen Use Group instruction provided  by PowerPoint, verbal discussion, and written material to discuss how supplemental oxygen is prescribed and different types of oxygen supply systems. Resources for more information are provided.    Breathing Techniques Group instruction that is supported by demonstration and informational handouts. Instructor discusses the benefits of pursed lip and diaphragmatic breathing and detailed demonstration on how to perform both.     Risk Factor Reduction Group instruction that is supported by a PowerPoint presentation. Instructor discusses the definition of a risk factor, different risk factors for pulmonary disease, and how the heart and lungs work together.   MD Day A group question and answer session with a medical doctor that allows participants to ask questions that relate to their pulmonary disease state.   Nutrition for the Pulmonary Patient Group instruction provided by PowerPoint slides, verbal discussion, and written materials to support subject matter. The instructor gives an explanation and review of healthy diet recommendations, which includes a discussion on weight management, recommendations for fruit and vegetable consumption, as well as protein, fluid, caffeine, fiber, sodium, sugar, and alcohol. Tips for eating when patients are short of breath are discussed.    Other Education Group or individual verbal, written, or video instructions that support the educational goals of the pulmonary rehab program.    Knowledge Questionnaire Score:  Knowledge Questionnaire Score - 08/15/22 0929       Knowledge Questionnaire Score   Pre Score 18/18             Core Components/Risk Factors/Patient Goals at Admission:  Personal Goals and Risk Factors at Admission - 08/15/22 0920       Core Components/Risk Factors/Patient Goals on Admission    Weight Management Yes;Weight Loss    Intervention Weight Management: Develop a combined nutrition and exercise program designed to reach  desired caloric intake, while maintaining appropriate intake of nutrient and fiber, sodium and fats, and appropriate energy expenditure required for the weight goal.;Weight Management: Provide education and appropriate resources to help participant work on and attain dietary goals.;Weight Management/Obesity: Establish reasonable short term and long term weight goals.;Obesity: Provide education and appropriate resources to help participant work on and attain dietary goals.    Expected Outcomes Short Term: Continue to assess and modify interventions until short term weight is achieved;Long Term: Adherence to nutrition and physical activity/exercise program aimed toward attainment of established weight goal;Weight Maintenance: Understanding of the daily nutrition guidelines, which includes 25-35% calories from fat, 7% or less cal from saturated fats, less than 200mg  cholesterol, less than 1.5gm of sodium, & 5 or more servings of fruits and vegetables daily;Weight Loss: Understanding of general recommendations for a balanced deficit meal plan, which promotes 1-2 lb weight loss per week and includes a negative energy balance of [phone] kcal/d;Understanding recommendations for meals to include 15-35% energy as protein, 25-35% energy from fat, 35-60% energy from carbohydrates, less than 200mg  of dietary cholesterol, 20-35 gm of total fiber daily;Understanding of distribution of calorie intake throughout the day with the consumption of 4-5 meals/snacks    Improve shortness of breath with ADL's Yes    Intervention Provide education, individualized exercise plan and daily activity instruction to help decrease symptoms of SOB with activities of daily living.    Expected Outcomes Short Term: Improve cardiorespiratory fitness to achieve a reduction of symptoms when performing ADLs;Long Term: Be able to perform more ADLs without symptoms or delay the  onset of symptoms             Core Components/Risk Factors/Patient  Goals Review:   Goals and Risk Factor Review     Row Name 09/03/22 0924             Core Components/Risk Factors/Patient Goals Review   Personal Goals Review Weight Management/Obesity;Improve shortness of breath with ADL's       Review Hannah Klein has recently started the program. She is working with staff dietician to achieve her weight loss goals. She is able to demonstrate purse lip breathing when she gets short of breath. She still gets short of breath when performing her ADL's. Hannah Klein is enjoying the program so far. We will continue to monitor her progress.       Expected Outcomes See admission goals.                Core Components/Risk Factors/Patient Goals at Discharge (Final Review):   Goals and Risk Factor Review - 09/03/22 0924       Core Components/Risk Factors/Patient Goals Review   Personal Goals Review Weight Management/Obesity;Improve shortness of breath with ADL's    Review Hannah Klein has recently started the program. She is working with staff dietician to achieve her weight loss goals. She is able to demonstrate purse lip breathing when she gets short of breath. She still gets short of breath when performing her ADL's. Hannah Klein is enjoying the program so far. We will continue to monitor her progress.    Expected Outcomes See admission goals.             ITP Comments: Pt is making expected progress toward Pulmonary Rehab goals after completing 3 sessions. Recommend continued exercise, life style modification, education, and utilization of breathing techniques to increase stamina and strength, while also decreasing shortness of breath with exertion.  Dr. Mechele Collin is Medical Director for Pulmonary Rehab at [facility].

## 2022-09-06 ENCOUNTER — Encounter (HOSPITAL_COMMUNITY)
Admission: RE | Admit: 2022-09-06 | Discharge: 2022-09-06 | Disposition: A | Discharge status: Home / Self Care | Payer: Commercial Managed Care - HMO | Source: Ambulatory Visit | Attending: Pulmonary Disease | Admitting: Pulmonary Disease

## 2022-09-06 DIAGNOSIS — J849 Interstitial pulmonary disease, unspecified: Secondary | ICD-10-CM

## 2022-09-06 NOTE — Progress Notes (Signed)
Daily Session Note  Patient Details  Name: Hannah Klein MRN: [id number] Date of Birth: 09/21/1975 Referring Provider:   Doristine Devoid Pulmonary Rehab Walk Test from 08/15/2022 in [facility]  Referring Provider Soskis  [sees [name]]       Encounter Date: 09/06/2022  Check In:  Session Check In - 09/06/22 1106       Check-In   Supervising physician immediately available to respond to emergencies [facility] MD immediately available    Physician(s) Micah Flesher, NP    Location [facility]    Staff Present Essie Hart, RN, BSN;Braylie Badami Idelle Crouch BS, ACSM-CEP, Exercise Physiologist;Kaylee Earlene Plater, MS, ACSM-CEP, Exercise Physiologist;Casey Katrinka Blazing, RT    Virtual Visit No    Medication changes reported     No    Fall or balance concerns reported    No    Tobacco Cessation No Change    Warm-up and Cool-down Performed as group-led instruction    Resistance Training Performed Yes    VAD Patient? No    PAD/SET Patient? No      Pain Assessment   Currently in Pain? No/denies    Multiple Pain Sites No             Capillary Blood Glucose: No results found for this or any previous visit (from the past 24 hour(s)).    Social History   Tobacco Use  Smoking Status Former   Current packs/day: 0.00   Average packs/day: 2.0 packs/day for 18.0 years (36.1 ttl pk-yrs)   Types: Cigarettes   Start date: 76   Quit date: 03/16/2007   Years since quitting: 15.4   Passive exposure: Current  Smokeless Tobacco Never    Goals Met:  Independence with exercise equipment Exercise tolerated well No report of concerns or symptoms today Strength training completed today  Goals Unmet:  Not Applicable  Comments: Service time is from 1011 to 1155.    Dr. Mechele Collin is Medical Director for Pulmonary Rehab at [facility].

## 2022-09-07 ENCOUNTER — Ambulatory Visit
Admission: RE | Admit: 2022-09-07 | Discharge: 2022-09-07 | Disposition: A | Discharge status: Home / Self Care | Payer: Commercial Managed Care - HMO | Source: Ambulatory Visit | Attending: Pulmonary Disease | Admitting: Pulmonary Disease

## 2022-09-07 DIAGNOSIS — J849 Interstitial pulmonary disease, unspecified: Secondary | ICD-10-CM

## 2022-09-07 DIAGNOSIS — R220 Localized swelling, mass and lump, head: Secondary | ICD-10-CM

## 2022-09-07 MED ORDER — IOPAMIDOL (ISOVUE-370) INJECTION 76%
75.0000 mL | Freq: Once | INTRAVENOUS | Status: AC | PRN
  Administered 2022-09-07: 75 mL via INTRAVENOUS

## 2022-09-08 ENCOUNTER — Other Ambulatory Visit: Payer: Self-pay | Admitting: Primary Care

## 2022-09-08 DIAGNOSIS — K219 Gastro-esophageal reflux disease without esophagitis: Secondary | ICD-10-CM

## 2022-09-11 ENCOUNTER — Encounter (HOSPITAL_COMMUNITY)
Admission: RE | Admit: 2022-09-11 | Discharge: 2022-09-11 | Disposition: A | Discharge status: Home / Self Care | Payer: Commercial Managed Care - HMO | Source: Ambulatory Visit | Attending: Pulmonary Disease | Admitting: Pulmonary Disease

## 2022-09-11 ENCOUNTER — Other Ambulatory Visit (HOSPITAL_COMMUNITY): Payer: Self-pay

## 2022-09-11 DIAGNOSIS — J849 Interstitial pulmonary disease, unspecified: Secondary | ICD-10-CM | POA: Diagnosis not present

## 2022-09-11 NOTE — Progress Notes (Signed)
Daily Session Note  Patient Details  Name: Hannah Klein MRN: [id number] Date of Birth: 1975/09/07 Referring Provider:   Doristine Devoid Pulmonary Rehab Walk Test from 08/15/2022 in [facility]  Referring Provider Soskis  [sees [name]]       Encounter Date: 09/11/2022  Check In:  Session Check In - 09/11/22 1104       Check-In   Supervising physician immediately available to respond to emergencies [facility] MD immediately available    Physician(s) Edd Fabian, NP    Location [facility]    Staff Present Samantha Belarus, RD, Dutch Gray, RN, BSN;Randi Reeve BS, ACSM-CEP, Exercise Physiologist;Kaylee Earlene Plater, MS, ACSM-CEP, Exercise Physiologist;Tapanga [place] Katrinka Blazing, RT    Virtual Visit No    Medication changes reported     No    Fall or balance concerns reported    No    Tobacco Cessation No Change    Warm-up and Cool-down Performed as group-led instruction    Resistance Training Performed Yes    VAD Patient? No    PAD/SET Patient? No      Pain Assessment   Currently in Pain? No/denies    Multiple Pain Sites No             Capillary Blood Glucose: No results found for this or any previous visit (from the past 24 hour(s)).    Social History   Tobacco Use  Smoking Status Former   Current packs/day: 0.00   Average packs/day: 2.0 packs/day for 18.0 years (36.1 ttl pk-yrs)   Types: Cigarettes   Start date: 53   Quit date: 03/16/2007   Years since quitting: 15.5   Passive exposure: Current  Smokeless Tobacco Never    Goals Met:  Proper associated with RPD/PD & O2 Sat Independence with exercise equipment Exercise tolerated well No report of concerns or symptoms today Strength training completed today  Goals Unmet:  Not Applicable  Comments: Service time is from 1014 to 1132.    Dr. Mechele Collin is Medical Director for Pulmonary Rehab at [facility].

## 2022-09-11 NOTE — Progress Notes (Signed)
Office Visit Note  [name]             Date of Birth: 21-Nov-1975           MRN: [id number]             PCP: Doreene Nest, NP Referring: Doreene Nest, NP Visit Date: 09/12/2022   Subjective:  Follow-up (Patient states her eye lids hurt. )   History of Present Illness: Hannah Klein is a 47 y.o. female here for follow up for Sjogren syndrome with CTD associated ILD now on cyclophosphamide infusions tapering mycophenolate and prednisone is currently at 15 mg daily.  She has had 2 infusions so far without any apparent severe reaction.  She had increase in burning and stinging type pain affecting both hands concern for neuropathy and seem to be getting worse when taking the mycophenolate which she is now tapering.  So far the symptom remains without much difference.  Also having more trouble with eye irritation especially painful first thing in the morning.  Eyes and mouth remain feeling extremely dry.  Notices more prominent livedo reticularis type discoloration on her legs no associated skin lesions and is not painful.    Previous HPI 06/13/22 Hannah Klein is a 47 y.o. female here for evaluation of suspected Sjogren syndrome or other CTD associated with ILD.  She has a longstanding history of persistent asthma she has been on multiple therapies but developed worsening shortness of breath issues since around November last year.  She had started on tezspire asthma treatment for 4 months prior to this due to active symptoms.  Worsening dyspnea prompted evaluation at the emergency department due to a high D-dimer and she had CTA imaging which was negative for blood clots but demonstrated groundglass opacities and is was admitted in December.  She was treated with antibiotics and prednisone taper without significant resolution of symptoms.  She had surgical lung biopsy by Dr. Cliffton Asters in January with pathology showing nonspecific inflammation and pulmonary  fibrosis.  Initial steroid course in January and then resumed again at 30 mg dose in March.  Symptoms are partially improved still having coughing and a lot of complaints about chest pain and sometimes left side neck pain.  She had previous ENT evaluation for cough which was attributed to irritation either upper airway cough syndrome or laryngal esophageal reflux.  She has a positive rheumatoid factor and antibody workup with Dr. 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  She started on mycophenolate and now increased to taking 1000 mg twice daily since last week.  So far tolerating this medication fine. She has longstanding dry eyes and mouth symptoms.  Had attributed this to her allergies and medications more in the past and just treated supportively.  Has noticed face and forearm rashes and sun sensitivity that are more of a new or increased problem recently.  She has noticed fingernail changes with increased brittleness no visible discoloration or pitting.  Has joint pains and stiffness in multiple areas not associated with swelling visible erythema or warmth.  Recently she is noticing swelling in her face and hands and unintentional weight gain.  Also more jittery movements and positional tremor.  These have been more pronounced since starting on the prednisone. Does not notice salivary gland or lymph node swelling.  No Raynaud's symptoms.   Labs reviewed 03/2022 ANA 1:1280 cytoplasmic SSA >8.0 RNP 1.2 MyoMarker SSA 52 kDa >200 RF 25 CCP neg CK 104  Hepatitis panel neg HIV neg   Review of Systems  Constitutional:  Positive for fatigue.  HENT:  Positive for mouth dryness. Negative for mouth sores.   Eyes:  Positive for dryness.  Respiratory:  Positive for shortness of breath.   Cardiovascular:  Positive for chest pain. Negative for palpitations.  Gastrointestinal:  Negative for blood in stool, constipation and diarrhea.  Endocrine: Negative for increased urination.  Genitourinary:   Positive for involuntary urination.  Musculoskeletal:  Positive for joint pain, joint pain, myalgias, muscle weakness and myalgias. Negative for gait problem, joint swelling, morning stiffness and muscle tenderness.  Skin:  Positive for hair loss. Negative for color change, rash and sensitivity to sunlight.  Allergic/Immunologic: Negative for susceptible to infections.  Neurological:  Positive for dizziness and headaches.  Hematological:  Positive for swollen glands.  Psychiatric/Behavioral:  Positive for sleep disturbance. Negative for depressed mood. The patient is nervous/anxious.     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   Class 1 obesity due to excess calories with serious comorbidity and body mass index (BMI) of 34.0 to 34.9 in adult 08/14/2022   Sjogren syndrome with lung involvement (HCC) 06/13/2022   High risk medication use 06/13/2022   ILD (interstitial lung disease) (HCC) 03/08/2022   S/P robot-assisted surgical procedure 03/08/2022   Pneumonitis 01/26/2022   Perennial allergic rhinitis 09/21/2021   Neck pain 08/31/2021   Allergic rhinitis due to pollen 08/31/2021   Moderate persistent asthma, uncomplicated 08/04/2021   Vitamin D deficiency 06/09/2021   BPV (benign positional vertigo), right 04/28/2021   Right tennis elbow 04/28/2021   Upper airway cough syndrome 09/28/2020   Pulmonary nodule 04/29/2020   Dyspnea 04/04/2020   Chronic rhinitis 04/04/2020   OSA (obstructive sleep apnea) 04/04/2020   Essential hypertension 11/11/2017   Hyperlipidemia 11/29/2016   Preventative health care 11/23/2015   Constipation 11/23/2015   Generalized anxiety disorder 04/26/2015   Right-sided low back pain with right-sided sciatica 04/26/2015   Asthma 04/26/2015   Frequent headaches 04/26/2015   Osteoarthritis 04/26/2015   GERD (gastroesophageal  reflux disease) 04/26/2015    Past Medical History:  Diagnosis Date   Arthritis    Asthma    Environmental allergies    Esophageal dysphagia    Generalized anxiety disorder    GERD (gastroesophageal reflux disease)    History of hiatal hernia    Hx of colonic polyps    Hypertension    Lung disorder    Patient states she has two unidentified ILD   Migraines    MRSA (methicillin resistant Staphylococcus aureus)    Lt upper arm  approx 8 years ago   Sleep apnea     Family History  Problem Relation Age of Onset   Eczema Mother    Allergic rhinitis Brother    Asthma Brother    Asthma Brother    Allergic rhinitis Brother    Urticaria Other    Angioedema Other    Breast cancer Neg Hx    Past Surgical History:  Procedure Laterality Date   CHOLECYSTECTOMY     DILATION AND CURETTAGE OF UTERUS     INTERCOSTAL NERVE BLOCK Right 03/08/2022   Procedure: INTERCOSTAL NERVE BLOCK;  Surgeon: Corliss Skains, MD;  Location: [facility] OR;  Service: Thoracic;  Laterality: Right;   LUNG BIOPSY  02/2022   NECK SURGERY     Fusion  to C6 and C7   TYMPANOSTOMY TUBE PLACEMENT     WRIST ARTHROSCOPY WITH DEBRIDEMENT Left 02/27/2018   Procedure: WRIST ARTHROSCOPY WITH DEBRIDEMENT;  Surgeon: Betha Loa, MD;  Location: [facility];  Service: Orthopedics;  Laterality: Left;   Social History   Social History Narrative   Single.   Moved from [place].   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 Filing for disability   Immunization History  Administered Date(s) Administered   Hepb-cpg 08/16/2022   Influenza-Unspecified 05/26/2017   Tdap 01/09/2018     Objective: Vital Signs: BP 127/82 (BP Location: Left Arm, Patient Position: Sitting, Cuff Size: Normal)   Pulse 71   Resp 20   Ht 5\' 9"  (1.753 m)   Wt 242 lb (109.8 kg)   LMP 07/31/2022 (Approximate)   BMI 35.74 kg/m    Physical Exam Constitutional:      Appearance: She is obese.  Eyes:     Conjunctiva/sclera:  Conjunctivae normal.  Cardiovascular:     Rate and Rhythm: Normal rate and regular rhythm.  Pulmonary:     Effort: Pulmonary effort is normal.  Skin:    Comments: Livedo reticularis visible bilateral legs  Neurological:     Mental Status: She is alert.  Psychiatric:        Mood and Affect: Mood normal.      Musculoskeletal Exam:  Neck tenderness on left of midline above clavicle, no palpable nodules Shoulders full ROM no tenderness or swelling Elbows full ROM no tenderness or swelling Wrists full ROM no tenderness or swelling Fingers full ROM no tenderness or swelling Knees full ROM no tenderness or swelling  Investigation: No additional findings.  Imaging: No results found.  Recent Labs: Lab Results  Component Value Date   WBC 13.9 (H) 08/27/2022   HGB 11.7 (L) 08/27/2022   PLT 203.0 08/27/2022   NA 134 (L) 08/27/2022   K 4.2 08/27/2022   CL 99 08/27/2022   CO2 26 08/27/2022   GLUCOSE 103 (H) 08/27/2022   BUN 17 08/27/2022   CREATININE 0.79 08/27/2022   BILITOT 0.7 08/27/2022   ALKPHOS 40 08/27/2022   AST 22 08/27/2022   ALT 15 08/27/2022   PROT 7.7 08/27/2022   ALBUMIN 4.8 08/27/2022   CALCIUM 10.4 08/27/2022   GFRAA >60 02/20/2018   QFTBGOLDPLUS INDETERMINATE (A) 05/17/2022    Speciality Comments: No specialty comments available.  Procedures:  No procedures performed Allergies: Advair diskus [fluticasone-salmeterol], Flonase [fluticasone], Sulfamethoxazole-trimethoprim, and Lipitor [atorvastatin calcium]   Assessment / Plan:     Visit Diagnoses: Sjogren syndrome with lung involvement (HCC) - Plan: Sedimentation rate, C-reactive protein, C3 and C4  Hard to say if has been change in disease activity does not have any reported respiratory symptoms change and there was not much peripheral disease activity at our previous.  Plan to recheck the sed rate CRP and serum complements for disease activity assessment will get this with the upcoming lab work for  which she is scheduled.  Tapering off MMF will be seeing if symptoms improve further with longer time on the cyclophosphamide and continuing prednisone 15 mg through pulmonary.  High risk medication use - Plan: CBC with Differential/Platelet, Urinalysis, Routine w reflex microscopic, COMPLETE METABOLIC PANEL WITH GFR  Tolerating medicine okay so far no serious interval infusion reactions or infections.  Plan to recheck labs for monitoring including blood count metabolic panel and urinalysis with reflex microscopy.  Can get these labs drawn at the lab appointment  prior to next Cytoxan infusion scheduled end of this month.  ILD (interstitial lung disease) (HCC)  Ongoing follow-up with Dr. Isaiah Serge appears stable but only recently changed medications.  Ongoing work with pulmonary rehab program.  Eyelid pain of both eyes  I think the severe eye pain first thing in the morning is related to her dry eye more than anything.  Not sure if this would get worse with her infusion treatment or if it is related to disease activity.  Reviewed use of lubricating eyedrops particularly gel or ointment based treatment for overnight.  Was already recommended addition of humidifier for bedroom.  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  She absolutely does not want to take gabapentin or related medications.  Livedo reticularis  No current evidence of associated ulcers or lesions discussed this is most commonly incidental finding.  Reviewed things to look out for diagnosis starting to see associated lesions or peripheral circulation issues.  Orders: Orders Placed This Encounter  Procedures   Sedimentation rate   C-reactive protein   CBC with Differential/Platelet   Urinalysis, Routine w reflex microscopic   COMPLETE  METABOLIC PANEL WITH GFR   C3 and C4   No orders of the defined types were placed in this encounter.    Follow-Up Instructions: Return in about 3 months (around 12/13/2022) for pSS/ILD on Cytoxan f/u 3mos.   Fuller Plan, MD  Note - This record has been created using [employer].  Chart creation errors have been sought, but may not always  have been located. Such creation errors do not reflect on  the standard of medical care.

## 2022-09-12 ENCOUNTER — Encounter: Payer: Self-pay | Admitting: Internal Medicine

## 2022-09-12 ENCOUNTER — Encounter (INDEPENDENT_AMBULATORY_CARE_PROVIDER_SITE_OTHER): Payer: Commercial Managed Care - HMO | Admitting: Internal Medicine

## 2022-09-12 VITALS — BP 127/82 | HR 71 | Resp 20 | Ht 69.0 in | Wt 242.0 lb

## 2022-09-12 DIAGNOSIS — Z79899 Other long term (current) drug therapy: Secondary | ICD-10-CM | POA: Diagnosis not present

## 2022-09-12 DIAGNOSIS — H0289 Other specified disorders of eyelid: Secondary | ICD-10-CM | POA: Diagnosis not present

## 2022-09-12 DIAGNOSIS — R231 Pallor: Secondary | ICD-10-CM

## 2022-09-12 DIAGNOSIS — M3502 Sicca syndrome with lung involvement: Secondary | ICD-10-CM | POA: Diagnosis not present

## 2022-09-12 DIAGNOSIS — J849 Interstitial pulmonary disease, unspecified: Secondary | ICD-10-CM

## 2022-09-12 DIAGNOSIS — G629 Polyneuropathy, unspecified: Secondary | ICD-10-CM | POA: Insufficient documentation

## 2022-09-12 DIAGNOSIS — G622 Polyneuropathy due to other toxic agents: Secondary | ICD-10-CM

## 2022-09-13 ENCOUNTER — Encounter (HOSPITAL_COMMUNITY)
Admission: RE | Admit: 2022-09-13 | Discharge: 2022-09-13 | Disposition: A | Discharge status: Home / Self Care | Payer: Commercial Managed Care - HMO | Source: Ambulatory Visit | Attending: Pulmonary Disease | Admitting: Pulmonary Disease

## 2022-09-13 ENCOUNTER — Encounter: Payer: Self-pay | Admitting: Pulmonary Disease

## 2022-09-13 DIAGNOSIS — J849 Interstitial pulmonary disease, unspecified: Secondary | ICD-10-CM

## 2022-09-13 NOTE — Progress Notes (Signed)
Daily Session Note  Patient Details  Name: Hannah Klein MRN: [id number] Date of Birth: 1975/09/29 Referring Provider:   Doristine Devoid Pulmonary Rehab Walk Test from 08/15/2022 in [facility] for Heart, Vascular, & Lung Health  Referring Provider Soskis  [sees [name]]       Encounter Date: 09/13/2022  Check In:  Session Check In - 09/13/22 1047       Check-In   Supervising physician immediately available to respond to emergencies [facility] MD immediately available    Physician(s) Bernadene Person, NP    Location [facility]    Staff Present Samantha Belarus, RD, Dutch Gray, RN, BSN;Randi Reeve BS, ACSM-CEP, Exercise Physiologist;Kaylee Earlene Plater, MS, ACSM-CEP, Exercise Physiologist;Jahfari Ambers Katrinka Blazing, RT    Virtual Visit No    Medication changes reported     No    Fall or balance concerns reported    No    Tobacco Cessation No Change    Warm-up and Cool-down Performed as group-led instruction    Resistance Training Performed Yes    VAD Patient? No    PAD/SET Patient? No      Pain Assessment   Currently in Pain? No/denies    Multiple Pain Sites No             Capillary Blood Glucose: No results found for this or any previous visit (from the past 24 hour(s)).    Social History   Tobacco Use  Smoking Status Former   Current packs/day: 0.00   Average packs/day: 2.0 packs/day for 18.0 years (36.1 ttl pk-yrs)   Types: Cigarettes   Start date: 61   Quit date: 03/16/2007   Years since quitting: 15.5   Passive exposure: Current  Smokeless Tobacco Never    Goals Met:  Proper associated with RPD/PD & O2 Sat Independence with exercise equipment Exercise tolerated well No report of concerns or symptoms today Strength training completed today  Goals Unmet:  Not Applicable  Comments: Service time is from 1014 to 1145.    Dr. Mechele Collin is Medical Director for Pulmonary Rehab at [facility].

## 2022-09-17 NOTE — Telephone Encounter (Signed)
Called [employer] to check on status of Ofev appeal. Per Rosann Auerbach rep, appeal was denied on 07/14/22. We never received the denial letter. Rep will re-fax the denial letter.

## 2022-09-17 NOTE — Telephone Encounter (Signed)
Per [employer] rep, denial for Ruxience was upheld. She will fax letter as we did not receive this  Chesley Mires, PharmD, MPH, BCPS, CPP Clinical Pharmacist (Rheumatology and Pulmonology)

## 2022-09-17 NOTE — Telephone Encounter (Signed)
Please order humidifier for home concentrator.

## 2022-09-18 ENCOUNTER — Encounter (HOSPITAL_COMMUNITY): Payer: Self-pay

## 2022-09-18 ENCOUNTER — Encounter (HOSPITAL_COMMUNITY)
Admission: RE | Admit: 2022-09-18 | Discharge: 2022-09-18 | Disposition: A | Discharge status: Home / Self Care | Payer: Commercial Managed Care - HMO | Source: Ambulatory Visit | Attending: Pulmonary Disease | Admitting: Pulmonary Disease

## 2022-09-18 VITALS — Wt 242.3 lb

## 2022-09-18 DIAGNOSIS — J849 Interstitial pulmonary disease, unspecified: Secondary | ICD-10-CM

## 2022-09-18 NOTE — Progress Notes (Signed)
Daily Session Note  Patient Details  Name: Hannah Klein MRN: [id number] Date of Birth: [birth year] Referring Provider:   Doristine Devoid Pulmonary Rehab Walk Test from 08/15/2022 in [facility] for Heart, Vascular, & Lung Health  Referring Provider Soskis  [sees [name]]       Encounter Date: 09/18/2022  Check In:  Session Check In - 09/18/22 1002       Check-In   Supervising physician immediately available to respond to emergencies [facility] MD immediately available    Physician(s) Bernadene Person, NP    Location [facility]    Staff Present Essie Hart, RN, Doris Cheadle, MS, ACSM-CEP, Exercise Physiologist;Casey Katrinka Blazing, RT    Virtual Visit No    Medication changes reported     No    Fall or balance concerns reported    No    Tobacco Cessation No Change    Warm-up and Cool-down Performed as group-led instruction    Resistance Training Performed Yes    VAD Patient? No    PAD/SET Patient? No      Pain Assessment   Currently in Pain? No/denies    Multiple Pain Sites No             Capillary Blood Glucose: No results found for this or any previous visit (from the past 24 hour(s)).   Exercise Prescription Changes - 09/18/22 1200       Response to Exercise   Blood Pressure (Admit) 124/62    Blood Pressure (Exercise) 150/60    Blood Pressure (Exit) 118/60    Heart Rate (Admit) 73 bpm    Heart Rate (Exercise) 100 bpm    Heart Rate (Exit) 76 bpm    Oxygen Saturation (Admit) 97 %    Oxygen Saturation (Exercise) 94 %    Oxygen Saturation (Exit) 97 %    Rating of Perceived Exertion (Exercise) 14    Perceived Dyspnea (Exercise) 2    Symptoms DOE    Duration Continue with 30 min of aerobic exercise without signs/symptoms of physical distress.    Intensity THRR unchanged      Progression   Progression Continue to progress workloads to maintain intensity without signs/symptoms of physical distress.      Resistance Training    Training Prescription Yes    Weight blue bands    Reps 10-15    Time 10 Minutes      Oxygen   Oxygen Continuous    Liters 10      Treadmill   MPH 2    Grade 1    Minutes 15    METs 2.81      Recumbant Elliptical   Level 3    RPM 40    Minutes 15    METs 2.6      Oxygen   Maintain Oxygen Saturation 88% or higher             Social History   Tobacco Use  Smoking Status Former   Current packs/day: 0.00   Average packs/day: 2.0 packs/day for 18.0 years (36.1 ttl pk-yrs)   Types: Cigarettes   Start date: 56   Quit date: 03/16/2007   Years since quitting: 15.5   Passive exposure: Current  Smokeless Tobacco Never    Goals Met:  Independence with exercise equipment Exercise tolerated well No report of concerns or symptoms today Strength training completed today  Goals Unmet:  Not Applicable  Comments: Service time is from 1023 to 1157  Dr. Mechele Collin is Medical Director for Pulmonary Rehab at [facility].

## 2022-09-19 ENCOUNTER — Ambulatory Visit (INDEPENDENT_AMBULATORY_CARE_PROVIDER_SITE_OTHER): Payer: Commercial Managed Care - HMO

## 2022-09-19 DIAGNOSIS — Z23 Encounter for immunization: Secondary | ICD-10-CM | POA: Diagnosis not present

## 2022-09-19 NOTE — Progress Notes (Signed)
Per orders of Mayra Reel, DPN AGNP-C,who is out of office and [name] who is in office injection of Hep B given by Lewanda Rife in right deltoid.Patient tolerated injection well.Hannah Klein

## 2022-09-20 ENCOUNTER — Encounter (HOSPITAL_COMMUNITY)
Admission: RE | Admit: 2022-09-20 | Discharge: 2022-09-20 | Disposition: A | Discharge status: Home / Self Care | Payer: Commercial Managed Care - HMO | Source: Ambulatory Visit | Attending: Pulmonary Disease | Admitting: Pulmonary Disease

## 2022-09-20 DIAGNOSIS — J849 Interstitial pulmonary disease, unspecified: Secondary | ICD-10-CM

## 2022-09-20 NOTE — Progress Notes (Signed)
Daily Session Note  Patient Details  Name: Hannah Klein MRN: [id number] Date of Birth: 1975/06/17 Referring Provider:   Doristine Devoid Pulmonary Rehab Walk Test from 08/15/2022 in [facility] for Heart, Vascular, & Lung Health  Referring Provider Soskis  [sees [name]]       Encounter Date: 09/20/2022  Check In:  Session Check In - 09/20/22 1208       Check-In   Supervising physician immediately available to respond to emergencies [facility] MD immediately available    Physician(s) Eligha Bridegroom, NP    [facility]    Staff Present Raford Pitcher, MS, ACSM-CEP, Exercise Physiologist;Ziyan Schoon Marcille Buffy, RN, BSN;Jetta Walker BS, ACSM-CEP, Exercise Physiologist    Virtual Visit No    Medication changes reported     No    Fall or balance concerns reported    No    Tobacco Cessation No Change    Warm-up and Cool-down Performed as group-led instruction    Resistance Training Performed Yes    VAD Patient? No    PAD/SET Patient? No      Pain Assessment   Currently in Pain? No/denies    Pain Score 0-No pain    Multiple Pain Sites No             Capillary Blood Glucose: No results found for this or any previous visit (from the past 24 hour(s)).    Social History   Tobacco Use  Smoking Status Former   Current packs/day: 0.00   Average packs/day: 2.0 packs/day for 18.0 years (36.1 ttl pk-yrs)   Types: Cigarettes   Start date: 59   Quit date: 03/16/2007   Years since quitting: 15.5   Passive exposure: Current  Smokeless Tobacco Never    Goals Met:  Proper associated with RPD/PD & O2 Sat Independence with exercise equipment Exercise tolerated well No report of concerns or symptoms today Strength training completed today  Goals Unmet:  Not Applicable  Comments: Service time is from 1014 to 1141.    Dr. Mechele Collin is Medical Director for Pulmonary Rehab at [facility].

## 2022-09-25 ENCOUNTER — Encounter: Payer: Self-pay | Admitting: Hematology

## 2022-09-25 ENCOUNTER — Encounter (HOSPITAL_COMMUNITY)
Admission: RE | Admit: 2022-09-25 | Discharge: 2022-09-25 | Disposition: A | Discharge status: Home / Self Care | Payer: Commercial Managed Care - HMO | Source: Ambulatory Visit | Attending: Pulmonary Disease | Admitting: Pulmonary Disease

## 2022-09-25 ENCOUNTER — Other Ambulatory Visit: Payer: Self-pay | Admitting: Pharmacist

## 2022-09-25 ENCOUNTER — Other Ambulatory Visit: Payer: Commercial Managed Care - HMO

## 2022-09-25 ENCOUNTER — Other Ambulatory Visit (INDEPENDENT_AMBULATORY_CARE_PROVIDER_SITE_OTHER): Payer: Commercial Managed Care - HMO

## 2022-09-25 DIAGNOSIS — J849 Interstitial pulmonary disease, unspecified: Secondary | ICD-10-CM

## 2022-09-25 LAB — URINALYSIS, ROUTINE W REFLEX MICROSCOPIC
Bilirubin Urine: NEGATIVE
Ketones, ur: NEGATIVE
Leukocytes,Ua: NEGATIVE
Nitrite: NEGATIVE
Specific Gravity, Urine: >=1.03 — AB (ref 1.000–1.030)
Urine Glucose: NEGATIVE
Urobilinogen, UA: 0.2 (ref 0.0–1.0)
pH: 6 (ref 5.0–8.0)

## 2022-09-25 LAB — CBC WITH DIFFERENTIAL/PLATELET
Basophils Absolute: 0 10*3/uL (ref 0.0–0.1)
Basophils Relative: 0.7 % (ref 0.0–3.0)
Eosinophils Absolute: 0.1 10*3/uL (ref 0.0–0.7)
Eosinophils Relative: 1.7 % (ref 0.0–5.0)
HCT: 35.7 % — ABNORMAL LOW (ref 36.0–46.0)
Hemoglobin: 11.4 g/dL — ABNORMAL LOW (ref 12.0–15.0)
Lymphocytes Relative: 12.9 % (ref 12.0–46.0)
Lymphs Abs: 0.9 10*3/uL (ref 0.7–4.0)
MCHC: 32 g/dL (ref 30.0–36.0)
MCV: 96.7 fl (ref 78.0–100.0)
Monocytes Absolute: 0.7 10*3/uL (ref 0.1–1.0)
Monocytes Relative: 10.3 % (ref 3.0–12.0)
Neutro Abs: 5 10*3/uL (ref 1.4–7.7)
Neutrophils Relative %: 74.4 % (ref 43.0–77.0)
Platelets: 185 10*3/uL (ref 150.0–400.0)
RBC: 3.7 Mil/uL — ABNORMAL LOW (ref 3.87–5.11)
RDW: 15.3 % (ref 11.5–15.5)
WBC: 6.7 10*3/uL (ref 4.0–10.5)

## 2022-09-25 LAB — SEDIMENTATION RATE: Sed Rate: 12 mm/hr (ref 0–20)

## 2022-09-25 LAB — C-REACTIVE PROTEIN: CRP: 1 mg/dL (ref 0.5–20.0)

## 2022-09-25 MED ORDER — FAMOTIDINE IN NACL 20-0.9 MG/50ML-% IV SOLN: 20.0000 mg | Freq: Once | INTRAVENOUS | Status: DC | PRN

## 2022-09-25 MED ORDER — DEXTROSE-SODIUM CHLORIDE 5-0.45 % IV SOLN: INTRAVENOUS | Status: DC

## 2022-09-25 MED ORDER — EPINEPHRINE 0.3 MG/0.3ML IJ SOAJ: 0.3000 mg | Freq: Once | INTRAMUSCULAR | Status: DC | PRN

## 2022-09-25 MED ORDER — ONDANSETRON HCL 4 MG PO TABS: 8.0000 mg | ORAL_TABLET | Freq: Once | ORAL | Status: DC

## 2022-09-25 MED ORDER — SODIUM CHLORIDE 0.9 % IV SOLN
400.0000 mg/m2 | Freq: Once | INTRAVENOUS | Status: AC
  Administered 2022-09-26: 900 mg via INTRAVENOUS
  Filled 2022-09-25 (×2): qty 9

## 2022-09-25 MED ORDER — HYDROCORTISONE SOD SUC (PF) 100 MG IJ SOLR
100.0000 mg | Freq: Once | INTRAMUSCULAR | Status: AC
  Administered 2022-09-26: 100 mg via INTRAVENOUS
  Filled 2022-09-25: qty 2

## 2022-09-25 MED ORDER — METHYLPREDNISOLONE SODIUM SUCC 125 MG IJ SOLR: 125.0000 mg | Freq: Once | INTRAMUSCULAR | Status: DC | PRN

## 2022-09-25 MED ORDER — ONDANSETRON 4 MG PO TBDP
8.0000 mg | ORAL_TABLET | Freq: Once | ORAL | Status: AC
  Administered 2022-09-26: 8 mg via ORAL
  Filled 2022-09-25: qty 2

## 2022-09-25 MED ORDER — ALBUTEROL SULFATE HFA 108 (90 BASE) MCG/ACT IN AERS: 2.0000 | INHALATION_SPRAY | Freq: Once | RESPIRATORY_TRACT | Status: DC | PRN

## 2022-09-25 MED ORDER — SODIUM CHLORIDE 0.9 % IV SOLN: Freq: Once | INTRAVENOUS | Status: DC | PRN

## 2022-09-25 MED ORDER — SODIUM CHLORIDE 0.9 % IV SOLN
1680.0000 mg | Freq: Once | INTRAVENOUS | Status: AC
  Administered 2022-09-26: 1680 mg via INTRAVENOUS
  Filled 2022-09-25 (×2): qty 84

## 2022-09-25 MED ORDER — DIPHENHYDRAMINE HCL 50 MG/ML IJ SOLN: 50.0000 mg | Freq: Once | INTRAMUSCULAR | Status: DC | PRN

## 2022-09-25 NOTE — Telephone Encounter (Signed)
Message was sent to [employer] on 09/19/22 but I don't see confirmation that [employer] got the order. I have sent Urgent message to [employer] asking them to check on this issue

## 2022-09-25 NOTE — Progress Notes (Signed)
Daily Session Note  Patient Details  Name: Hannah Klein MRN: [id number] Date of Birth: Oct 22, 1975 Referring Provider:   Doristine Devoid Pulmonary Rehab Walk Test from 08/15/2022 in [facility] Heart, Vascular, & Lung Health  Referring Provider Soskis  [sees [name]]       Encounter Date: 09/25/2022  Check In:  Session Check In - 09/25/22 1235       Check-In   Supervising physician immediately available to respond to emergencies [facility] MD immediately available    Physician(s) Robin Searing, NP    Location [facility]    Staff Present Durel Salts, RT;Carlette Les Pou, RN, BSN;Jetta Walker BS, ACSM-CEP, Exercise Physiologist;Eluterio Seymour Dionisio Paschal, ACSM-CEP, Exercise Physiologist;Samantha Belarus, RD, LDN    Virtual Visit No    Medication changes reported     No    Fall or balance concerns reported    No    Tobacco Cessation No Change    Warm-up and Cool-down Performed as group-led instruction    Resistance Training Performed Yes    VAD Patient? No    PAD/SET Patient? No      Pain Assessment   Currently in Pain? Yes    Pain Location Shoulder             Capillary Blood Glucose: Results for orders placed or performed in visit on 09/25/22 (from the past 24 hour(s))  Sedimentation rate     Status: None   Collection Time: 09/25/22  9:31 AM  Result Value Ref Range   Sed Rate 12 0 - 20 mm/hr  C-reactive protein     Status: None   Collection Time: 09/25/22  9:31 AM  Result Value Ref Range   CRP 1.0 0.5 - 20.0 mg/dL  Urinalysis, Routine w reflex microscopic     Status: Abnormal   Collection Time: 09/25/22  9:31 AM  Result Value Ref Range   Color, Urine Dark Yellow (A) Yellow;Lt. Yellow;Straw;Dark Yellow;Amber;Green;Red;Brown   APPearance Cloudy (A) Clear;Turbid;Slightly Cloudy;Cloudy   Specific Gravity, Urine >=1.030 (A) 1.000 - 1.030   pH 6.0 5.0 - 8.0   Total Protein, Urine TRACE (A) Negative   Urine Glucose NEGATIVE Negative    Ketones, ur NEGATIVE Negative   Bilirubin Urine NEGATIVE Negative   Hgb urine dipstick LARGE (A) Negative   Urobilinogen, UA 0.2 0.0 - 1.0   Leukocytes,Ua NEGATIVE Negative   Nitrite NEGATIVE Negative   WBC, UA 0-2/hpf 0-2/hpf   RBC / HPF TNTC(>50/hpf) (A) 0-2/hpf   Mucus, UA Presence of (A) None   Squamous Epithelial / HPF Rare(0-4/hpf) Rare(0-4/hpf)   Hyaline Casts, UA Presence of (A) None  CBC with Differential/Platelet     Status: Abnormal   Collection Time: 09/25/22  9:31 AM  Result Value Ref Range   WBC 6.7 4.0 - 10.5 K/uL   RBC 3.70 (L) 3.87 - 5.11 Mil/uL   Hemoglobin 11.4 (L) 12.0 - 15.0 g/dL   HCT 95.2 (L) 84.1 - 32.4 %   MCV 96.7 78.0 - 100.0 fl   MCHC 32.0 30.0 - 36.0 g/dL   RDW 40.1 02.7 - 25.3 %   Platelets 185.0 150.0 - 400.0 K/uL   Neutrophils Relative % 74.4 43.0 - 77.0 %   Lymphocytes Relative 12.9 12.0 - 46.0 %   Monocytes Relative 10.3 3.0 - 12.0 %   Eosinophils Relative 1.7 0.0 - 5.0 %   Basophils Relative 0.7 0.0 - 3.0 %   Neutro Abs 5.0 1.4 - 7.7 K/uL  Lymphs Abs 0.9 0.7 - 4.0 K/uL   Monocytes Absolute 0.7 0.1 - 1.0 K/uL   Eosinophils Absolute 0.1 0.0 - 0.7 K/uL   Basophils Absolute 0.0 0.0 - 0.1 K/uL      Social History   Tobacco Use  Smoking Status Former   Current packs/day: 0.00   Average packs/day: 2.0 packs/day for 18.0 years (36.1 ttl pk-yrs)   Types: Cigarettes   Start date: 64   Quit date: 03/16/2007   Years since quitting: 15.5   Passive exposure: Current  Smokeless Tobacco Never    Goals Met:  Independence with exercise equipment Exercise tolerated well No report of concerns or symptoms today Strength training completed today  Goals Unmet:  Not Applicable  Comments: Service time is from 1014 to 1150.    Dr. Mechele Collin is Medical Director for Pulmonary Rehab at [facility].

## 2022-09-25 NOTE — Telephone Encounter (Signed)
I received a message from [place] with [employer] see below This was delivered 09/19/2022

## 2022-09-26 ENCOUNTER — Ambulatory Visit (INDEPENDENT_AMBULATORY_CARE_PROVIDER_SITE_OTHER): Payer: Commercial Managed Care - HMO

## 2022-09-26 VITALS — BP 135/71 | HR 73 | Temp 97.7°F | Resp 20 | Ht 69.0 in | Wt 239.8 lb

## 2022-09-26 DIAGNOSIS — M3502 Sicca syndrome with lung involvement: Secondary | ICD-10-CM

## 2022-09-26 DIAGNOSIS — J849 Interstitial pulmonary disease, unspecified: Secondary | ICD-10-CM | POA: Diagnosis not present

## 2022-09-26 NOTE — Progress Notes (Signed)
Diagnosis: Interstitial Lung Disease  Provider:  Chilton Greathouse MD  Procedure: IV Infusion  IV Type: Peripheral, IV Location: R Hand   D5 1/2 NS 400 mL IV: 0852 - 1052  Solu-cortef 100 mg IV: 1025  Zofran ODT 8 mg: 1055  [place]esna 900 mg IV: 1101 - 1141  Cytoxan 1680 mg IV: 1152 - 1232 (administered by Kasandra Knudsen., RN and [name] RN)  Mesna 900 mg IV: 1249 - 1329  D5 1/2 NS 500 mL IV: 1333 - 1442   Post Infusion IV Care: Observation period completed and Peripheral IV Discontinued  Discharge: Condition: Good, Destination: Home . AVS Declined  Performed by:  Wyvonne Lenz, RN

## 2022-09-27 ENCOUNTER — Encounter (HOSPITAL_COMMUNITY): Payer: Commercial Managed Care - HMO

## 2022-10-02 ENCOUNTER — Encounter (HOSPITAL_COMMUNITY)
Admission: RE | Admit: 2022-10-02 | Discharge: 2022-10-02 | Disposition: A | Discharge status: Home / Self Care | Payer: Commercial Managed Care - HMO | Source: Ambulatory Visit | Attending: Pulmonary Disease | Admitting: Pulmonary Disease

## 2022-10-02 ENCOUNTER — Encounter: Payer: Self-pay | Admitting: Pulmonary Disease

## 2022-10-02 VITALS — Wt 240.3 lb

## 2022-10-02 DIAGNOSIS — J849 Interstitial pulmonary disease, unspecified: Secondary | ICD-10-CM | POA: Insufficient documentation

## 2022-10-02 NOTE — Progress Notes (Signed)
Daily Session Note  Patient Details  Name: Hannah Klein MRN: [id number] Date of Birth: Jun 27, 1975 Referring Provider:   Doristine Devoid Pulmonary Rehab Walk Test from 08/15/2022 in [facility]  Referring Provider Soskis  [sees [name]]       Encounter Date: 10/02/2022  Check In:  Session Check In - 10/02/22 1200       Check-In   Supervising physician immediately available to respond to emergencies [facility] MD immediately available    Physician(s) Carlyon Shadow, NP    Location [facility]    Staff Present Durel Salts, RT;Randi Idelle Crouch BS, ACSM-CEP, Exercise Physiologist;Samantha Belarus, RD, Dutch Gray, RN, Doris Cheadle, MS, ACSM-CEP, Exercise Physiologist    Virtual Visit No    Medication changes reported     No    Fall or balance concerns reported    No    Tobacco Cessation No Change    Warm-up and Cool-down Performed as group-led instruction    Resistance Training Performed Yes    VAD Patient? No    PAD/SET Patient? No      Pain Assessment   Currently in Pain? Yes    Pain Score 4     Pain Location Thoracic   Lung pain   Pain Orientation Lower    Pain Descriptors / Indicators Aching    Pain Type Chronic pain    Pain Onset More than a month ago    Pain Frequency Intermittent    Multiple Pain Sites No             Capillary Blood Glucose: No results found for this or any previous visit (from the past 24 hour(s)).   Exercise Prescription Changes - 10/02/22 1200       Response to Exercise   Blood Pressure (Admit) 118/62    Blood Pressure (Exercise) 134/62    Blood Pressure (Exit) 112/68    Heart Rate (Admit) 71 bpm    Heart Rate (Exercise) 110 bpm    Heart Rate (Exit) 83 bpm    Oxygen Saturation (Admit) 96 %    Oxygen Saturation (Exercise) 89 %    Oxygen Saturation (Exit) 96 %    Rating of Perceived Exertion (Exercise) 13    Perceived Dyspnea (Exercise) 2    Duration  Continue with 30 min of aerobic exercise without signs/symptoms of physical distress.    Intensity THRR unchanged      Progression   Progression Continue to progress workloads to maintain intensity without signs/symptoms of physical distress.      Resistance Training   Training Prescription Yes    Weight blue bands    Reps 10-15    Time 10 Minutes      Oxygen   Oxygen Continuous    Liters 8-10      Treadmill   MPH 2    Grade 1    Minutes 15    METs 2.81      Recumbant Elliptical   Level 3    Minutes 15    METs 2.3      Oxygen   Maintain Oxygen Saturation 88% or higher             Social History   Tobacco Use  Smoking Status Former   Current packs/day: 0.00   Average packs/day: 2.0 packs/day for 18.0 years (36.1 ttl pk-yrs)   Types: Cigarettes   Start date: 72   Quit date: 03/16/2007   Years since  quitting: 15.5   Passive exposure: Current  Smokeless Tobacco Never    Goals Met:  Proper associated with RPD/PD & O2 Sat Independence with exercise equipment Exercise tolerated well No report of concerns or symptoms today Strength training completed today  Goals Unmet:  Not Applicable  Comments: Service time is from 1011 to 1138.    Dr. Mechele Collin is Medical Director for Pulmonary Rehab at [facility].

## 2022-10-02 NOTE — Progress Notes (Signed)
Home Exercise Prescription I have reviewed a Home Exercise Prescription with [name]. Hannah Klein is currently walking at home. She walks 1 non-rehab day/wk for 10-15 min/day. We discussed Lung Transplant guidelines and building to meeting those guidelines. I mentioned walking 3 non-rehab days/wk for at least 20 min/day. She agreed with my recommendations. I discussed increasing her walking time first and then frequency. I recommended doing a slow progression to meeting transplant guidelines. Hannah Klein agreed to my progression plan. We also discussed exercise strategies as Hannah Klein is walking outside. She is willing to try going to the local [employer] to avoid the heat and humidity. I discussed weather guidelines with [name]. She voiced understanding. She seems motivated to exercise and increase her functional capacity.Completed home ExRx. Hannah Klein is currently walking at home. She walks 1 non-rehab day/wk for 10-15 min/day. We discussed Lung Transplant guidelines and building to meeting those guidelines. I mentioned walking 3 non-rehab days/wk for at least 20 min/day. She agreed with my recommendations. I discussed increasing her walking time first and then frequency. I recommended doing a slow progression to meeting transplant guidelines. Hannah Klein agreed to my progression plan. We also discussed exercise strategies as Hannah Klein is walking outside. She is willing to try going to the local [employer] to avoid the heat and humidity. I discussed weather guidelines with [name]. She voiced understanding. She seems motivated to exercise and increase her functional capacity. The patient stated that their goals were to lose weight and qualify for a lung transplant. We reviewed exercise guidelines, target heart rate during exercise, RPE Scale, weather conditions, endpoints for exercise, warmup and cool down. The patient is encouraged to come to me with any questions. I will continue to follow up with the patient to assist them  with progression and safety. Spent 15 min with patient discussing home exercise plan and goals  [name], MS, ACSM-CEP 10/02/2022 3:54 PM

## 2022-10-03 NOTE — Telephone Encounter (Signed)
Received the following message from patient:   "I am having pain on my right side on my back.  I believe its my right lung.  We have discussed this before.  Since last treatment I have more severe episodes where it hurts.  It hurts taking a deep breath.     I had pulmonary rehab today and due to the pain they listened to my lungs and said they heard crackling in my right lung and thought I might need an xray.  Is there anything we can do for this pain?  Do we know what is causing this pain?   What were the results of the last 2 ct scans I had?   Thank you Hannah Klein"  Dr. Isaiah Serge, can you please advise? Thanks!

## 2022-10-03 NOTE — Progress Notes (Signed)
Pulmonary Individual Treatment Plan  Patient Details  Name: Hannah Klein MRN: [id number] Date of Birth: 1975/11/03 Referring Provider:   Doristine Devoid Pulmonary Rehab Walk Test from 08/15/2022 in [facility]  Referring Provider Soskis  [sees [name]]       Initial Encounter Date:  Flowsheet Row Pulmonary Rehab Walk Test from 08/15/2022 in [facility]  Date 08/15/22       Visit Diagnosis: Interstitial lung disease (HCC)  Patient's Home Medications on Admission:   Current Outpatient Medications:    acetaminophen (TYLENOL) 500 MG tablet, Take 2 tablets (1,000 mg total) by mouth every 6 (six) hours as needed., Disp: 30 tablet, Rfl: 0   albuterol (VENTOLIN HFA) 108 (90 Base) MCG/ACT inhaler, INHALE 1-2 PUFFS BY MOUTH EVERY 6 HOURS AS NEEDED FOR WHEEZE OR SHORTNESS OF BREATH, Disp: 18 g, Rfl: 1   B Complex-C (B-COMPLEX WITH VITAMIN C) tablet, Take 1 tablet by mouth daily., Disp: , Rfl:    budesonide (PULMICORT) 0.25 MG/2ML nebulizer solution, Take 0.25 mg by nebulization 2 (two) times daily. (Patient not taking: Reported on 09/12/2022), Disp: , Rfl:    Cholecalciferol (VITAMIN D3) 125 MCG (5000 UT) TABS, Take 5,000 Units by mouth daily., Disp: , Rfl:    clobetasol ointment (TEMOVATE) 0.05 %, Apply 1 Application topically 2 (two) times daily. (Patient not taking: Reported on 09/12/2022), Disp: 30 g, Rfl: 0   dapsone 25 MG tablet, TAKE 2 TABLETS BY MOUTH 2 TIMES DAILY., Disp: 360 tablet, Rfl: 1   diclofenac Sodium (VOLTAREN ARTHRITIS PAIN) 1 % GEL, Apply 2 g topically daily as needed (Chest pain). (Patient not taking: Reported on 09/12/2022), Disp: , Rfl:    esomeprazole (NEXIUM) 40 MG capsule, TAKE 1 CAPSULE (40 MG TOTAL) BY MOUTH 2 (TWO) TIMES DAILY. FOR HEARTBURN., Disp: 180 capsule, Rfl: 2   famotidine (PEPCID) 40 MG tablet, Take 1 tablet (40 mg total) by mouth 2 (two)  times daily. for heartburn., Disp: 180 tablet, Rfl: 2   formoterol (PERFOROMIST) 20 MCG/2ML nebulizer solution, Take 2 mLs (20 mcg total) by nebulization 2 (two) times daily. (Patient taking differently: Take 20 mcg by nebulization daily as needed (Shortness of breath).), Disp: 120 mL, Rfl: 1   formoterol (PERFOROMIST) 20 MCG/2ML nebulizer solution, Take 20 mcg by nebulization 2 (two) times daily. (Patient not taking: Reported on 09/12/2022), Disp: , Rfl:    hydrOXYzine (VISTARIL) 50 MG capsule, TAKE 1 CAPSULE BY MOUTH ONCE OR TWICE DAILY AS NEEDED FOR ANXIETY, Disp: 180 capsule, Rfl: 2   levocetirizine (XYZAL) 5 MG tablet, Take 1 tablet (5 mg total) by mouth 2 (two) times daily. For allergies, Disp: 180 tablet, Rfl: 3   mometasone-formoterol (DULERA) 200-5 MCG/ACT AERO, Inhale 2 puffs into the lungs every 12 (twelve) hours. (Patient not taking: Reported on 09/12/2022), Disp: 13 g, Rfl: 5   Multiple Vitamin (MULTIVITAMIN) capsule, Take 1 capsule by mouth daily., Disp: , Rfl:    mycophenolate (CELLCEPT) 500 MG tablet, Take 3 tablets (1,500 mg total) by mouth 2 (two) times daily., Disp: 120 tablet, Rfl: 3   Omega-3 Fatty Acids (OMEGA-3 FISH OIL) 300 MG CAPS, Take 1,000 mg by mouth., Disp: , Rfl:    ondansetron (ZOFRAN) 4 MG tablet, Take 1 tablet (4 mg total) by mouth every 8 (eight) hours as needed for nausea or vomiting., Disp: 20 tablet, Rfl: 0   oxyCODONE-acetaminophen (PERCOCET) 7.5-325 MG tablet, Take 1  tablet by mouth every 6 (six) hours as needed for up to 20 doses for severe pain., Disp: 20 tablet, Rfl: 0   predniSONE (DELTASONE) 10 MG tablet, Take 2 tabs (20mg )  Twice daily, Disp: 120 tablet, Rfl: 1   predniSONE (DELTASONE) 5 MG tablet, Take 1 tablet (5 mg total) by mouth daily with breakfast., Disp: 90 tablet, Rfl: 1   rosuvastatin (CRESTOR) 10 MG tablet, TAKE 1 TABLET BY MOUTH EVERY DAY FOR CHOLESTEROL, Disp: 90 tablet, Rfl: 2   sucralfate (CARAFATE) 1 g tablet, Take 1 tablet (1 g total) by  mouth in the morning, at noon, and at bedtime., Disp: 90 tablet, Rfl: 5   vitamin B-12 (CYANOCOBALAMIN) 1000 MCG tablet, Take 1,000 mcg by mouth daily., Disp: , Rfl:    vitamin C (ASCORBIC ACID) 500 MG tablet, Take 500 mg by mouth daily., Disp: , Rfl:    vitamin E 180 MG (400 UNITS) capsule, Take 400 Units by mouth daily., Disp: , Rfl:   Past Medical History: Past Medical History:  Diagnosis Date   Arthritis    Asthma    Environmental allergies    Esophageal dysphagia    Generalized anxiety disorder    GERD (gastroesophageal reflux disease)    History of hiatal hernia    Hx of colonic polyps    Hypertension    Lung disorder    Patient states she has two unidentified ILD   Migraines    MRSA (methicillin resistant Staphylococcus aureus)    Lt upper arm  approx 8 years ago   Sleep apnea     Tobacco Use: Social History   Tobacco Use  Smoking Status Former   Current packs/day: 0.00   Average packs/day: 2.0 packs/day for 18.0 years (36.1 ttl pk-yrs)   Types: Cigarettes   Start date: 46   Quit date: 03/16/2007   Years since quitting: 15.5   Passive exposure: Current  Smokeless Tobacco Never    Labs: Review Flowsheet  More data exists      Latest Ref Rng & Units 04/07/2019 07/07/2019 03/17/2020 06/09/2021 05/31/2022  Labs for ITP Cardiac and Pulmonary Rehab  Cholestrol 0 - 200 mg/dL 098  119  147  829  562   LDL (calc) 0 - 99 mg/dL 78  130  94  - -  Direct LDL mg/dL - - - 865.7  846.9   HDL-C >39.00 mg/dL 62.95  28.41  32.44  01.02  66.80   Trlycerides 0.0 - 149.0 mg/dL 725.3  664.4  034.7  425.9  252.0   Hemoglobin A1c 4.6 - 6.5 % - - 5.5  5.6  5.5     Details            Capillary Blood Glucose: No results found for: "GLUCAP"   Pulmonary Assessment Scores:  Pulmonary Assessment Scores     Row Name 08/15/22 0925         ADL UCSD   ADL Phase Entry     SOB Score total 65       CAT Score   CAT Score 19       mMRC Score   mMRC Score 4              UCSD: Self-administered rating of dyspnea associated with activities of daily living (ADLs) 6-point scale (0 = "not at all" to 5 = "maximal or unable to do because of breathlessness")  Scoring Scores range from 0 to 120.  Minimally important difference is 5 units  CAT:  CAT can identify the health impairment of COPD patients and is better correlated with disease progression.  CAT has a scoring range of zero to 40. The CAT score is classified into four groups of low (less than 10), medium (10 - 20), high (21-30) and very high (31-40) based on the impact level of disease on health status. A CAT score over 10 suggests significant symptoms.  A worsening CAT score could be explained by an exacerbation, poor medication adherence, poor inhaler technique, or progression of COPD or comorbid conditions.  CAT MCID is 2 points  mMRC: mMRC (Modified Medical Research Council) Dyspnea Scale is used to assess the degree of baseline functional disability in patients of respiratory disease due to dyspnea. No minimal important difference is established. A decrease in score of 1 point or greater is considered a positive change.   Pulmonary Function Assessment:  Pulmonary Function Assessment - 08/15/22 0924       Breath   Shortness of Breath Yes;Limiting activity             Exercise Target Goals: Exercise Program Goal: Individual exercise prescription set using results from initial 6 min walk test and THRR while considering  patient's activity barriers and safety.   Exercise Prescription Goal: Initial exercise prescription builds to 30-45 minutes a day of aerobic activity, 2-3 days per week.  Home exercise guidelines will be given to patient during program as part of exercise prescription that the participant will acknowledge.  Activity Barriers & Risk Stratification:  Activity Barriers & Cardiac Risk Stratification - 08/15/22 0927       Activity Barriers & Cardiac Risk Stratification    Activity Barriers Deconditioning;Muscular Weakness;Shortness of Breath;Arthritis;Neck/Spine Problems;Joint Problems    Comments Rt shoulder bursitis and hip pain             6 Minute Walk:  6 Minute Walk     Row Name 08/15/22 1030         6 Minute Walk   Phase Initial     Distance 1170 feet     Walk Time 6 minutes     # of Rest Breaks 2  2:29-2:40, 4:56-5:10     MPH 2.22     METS 3.55     RPE 11     Perceived Dyspnea  2     VO2 Peak 12.44     Symptoms No     Resting HR 87 bpm     Resting BP 104/62     Resting Oxygen Saturation  96 %     Exercise Oxygen Saturation  during 6 min walk 85 %     Max Ex. HR 107 bpm     Max Ex. BP 134/70     2 Minute Post BP 118/64       Interval HR   1 Minute HR 104     2 Minute HR 107     3 Minute HR 102     4 Minute HR 98     5 Minute HR 97     6 Minute HR 99     2 Minute Post HR 79     Interval Heart Rate? Yes       Interval Oxygen   Interval Oxygen? Yes     Baseline Oxygen Saturation % 96 %     1 Minute Oxygen Saturation % 94 %     1 Minute Liters of Oxygen 8 L     2 Minute Oxygen Saturation % 90 %  2 Minute Liters of Oxygen 8 L     3 Minute Oxygen Saturation % 91 %  2:29 85%     3 Minute Liters of Oxygen 8 L  increased to 10L     4 Minute Oxygen Saturation % 95 %     4 Minute Liters of Oxygen 10 L     5 Minute Oxygen Saturation % 98 %     5 Minute Liters of Oxygen 10 L     6 Minute Oxygen Saturation % 94 %     6 Minute Liters of Oxygen 10 L     2 Minute Post Oxygen Saturation % 99 %     2 Minute Post Liters of Oxygen 8 L              Oxygen Initial Assessment:  Oxygen Initial Assessment - 08/15/22 0923       Home Oxygen   Home Oxygen Device E-Tanks;Home Concentrator    Sleep Oxygen Prescription Continuous    Liters per minute 4    Home Exercise Oxygen Prescription Continuous    Liters per minute 8    Home Resting Oxygen Prescription Continuous    Liters per minute 2    Compliance with Home Oxygen Use  Yes      Initial 6 min Walk   Oxygen Used Continuous    Liters per minute 8      Program Oxygen Prescription   Program Oxygen Prescription Continuous    Liters per minute 8      Intervention   Short Term Goals To learn and exhibit compliance with exercise, home and travel O2 prescription;To learn and understand importance of maintaining oxygen saturations>88%;To learn and demonstrate proper use of respiratory medications;To learn and understand importance of monitoring SPO2 with pulse oximeter and demonstrate accurate use of the pulse oximeter.;To learn and demonstrate proper pursed lip breathing techniques or other breathing techniques.     Long  Term Goals Exhibits compliance with exercise, home  and travel O2 prescription;Verbalizes importance of monitoring SPO2 with pulse oximeter and return demonstration;Maintenance of O2 saturations>88%;Exhibits proper breathing techniques, such as pursed lip breathing or other method taught during program session;Compliance with respiratory medication;Demonstrates proper use of MDI's             Oxygen Re-Evaluation:  Oxygen Re-Evaluation     Row Name 08/31/22 1015 09/21/22 1351           Program Oxygen Prescription   Program Oxygen Prescription Continuous Continuous      Liters per minute 8 10      Comments -- 94% on 10L        Home Oxygen   Home Oxygen Device E-Tanks;Home Concentrator E-Tanks;Home Concentrator      Sleep Oxygen Prescription Continuous Continuous      Liters per minute 4 4      Home Exercise Oxygen Prescription Continuous Continuous      Liters per minute 8 8      Home Resting Oxygen Prescription Continuous Continuous      Liters per minute 2 2      Compliance with Home Oxygen Use Yes Yes        Goals/Expected Outcomes   Short Term Goals To learn and exhibit compliance with exercise, home and travel O2 prescription;To learn and understand importance of maintaining oxygen saturations>88%;To learn and demonstrate  proper use of respiratory medications;To learn and understand importance of monitoring SPO2 with pulse oximeter and demonstrate accurate use of the  pulse oximeter.;To learn and demonstrate proper pursed lip breathing techniques or other breathing techniques.  To learn and exhibit compliance with exercise, home and travel O2 prescription;To learn and understand importance of maintaining oxygen saturations>88%;To learn and demonstrate proper use of respiratory medications;To learn and understand importance of monitoring SPO2 with pulse oximeter and demonstrate accurate use of the pulse oximeter.;To learn and demonstrate proper pursed lip breathing techniques or other breathing techniques.       Long  Term Goals Exhibits compliance with exercise, home  and travel O2 prescription;Verbalizes importance of monitoring SPO2 with pulse oximeter and return demonstration;Maintenance of O2 saturations>88%;Exhibits proper breathing techniques, such as pursed lip breathing or other method taught during program session;Compliance with respiratory medication;Demonstrates proper use of MDI's Exhibits compliance with exercise, home  and travel O2 prescription;Verbalizes importance of monitoring SPO2 with pulse oximeter and return demonstration;Maintenance of O2 saturations>88%;Exhibits proper breathing techniques, such as pursed lip breathing or other method taught during program session;Compliance with respiratory medication;Demonstrates proper use of MDI's      Goals/Expected Outcomes Compliance and understanding of oxygen saturation monitoring and breathing techniques to decrease shortness of breath. Compliance and understanding of oxygen saturation monitoring and breathing techniques to decrease shortness of breath.               Oxygen Discharge (Final Oxygen Re-Evaluation):  Oxygen Re-Evaluation - 09/21/22 1351       Program Oxygen Prescription   Program Oxygen Prescription Continuous    Liters per minute 10     Comments 94% on 10L      Home Oxygen   Home Oxygen Device E-Tanks;Home Concentrator    Sleep Oxygen Prescription Continuous    Liters per minute 4    Home Exercise Oxygen Prescription Continuous    Liters per minute 8    Home Resting Oxygen Prescription Continuous    Liters per minute 2    Compliance with Home Oxygen Use Yes      Goals/Expected Outcomes   Short Term Goals To learn and exhibit compliance with exercise, home and travel O2 prescription;To learn and understand importance of maintaining oxygen saturations>88%;To learn and demonstrate proper use of respiratory medications;To learn and understand importance of monitoring SPO2 with pulse oximeter and demonstrate accurate use of the pulse oximeter.;To learn and demonstrate proper pursed lip breathing techniques or other breathing techniques.     Long  Term Goals Exhibits compliance with exercise, home  and travel O2 prescription;Verbalizes importance of monitoring SPO2 with pulse oximeter and return demonstration;Maintenance of O2 saturations>88%;Exhibits proper breathing techniques, such as pursed lip breathing or other method taught during program session;Compliance with respiratory medication;Demonstrates proper use of MDI's    Goals/Expected Outcomes Compliance and understanding of oxygen saturation monitoring and breathing techniques to decrease shortness of breath.             Initial Exercise Prescription:  Initial Exercise Prescription - 08/15/22 1000       Date of Initial Exercise RX and Referring Provider   Date 08/15/22    Referring Provider Soskis   sees [facility]   Expected Discharge Date 11/08/22      Oxygen   Oxygen Continuous    Liters 8      Treadmill   MPH 2    Grade 0    Minutes 15    METs 2.53      Recumbant Elliptical   Level 1    RPM 30    Watts 70    Minutes 15  METs 2.3      Prescription Details   Frequency (times per week) 2    Duration Progress to 30 minutes of continuous aerobic  without signs/symptoms of physical distress      Intensity   THRR 40-80% of Max Heartrate 69-138    Ratings of Perceived Exertion 11-13    Perceived Dyspnea 0-4      Progression   Progression Continue to progress workloads to maintain intensity without signs/symptoms of physical distress.      Resistance Training   Training Prescription Yes    Weight blue bands    Reps 10-15             Perform Capillary Blood Glucose checks as needed.  Exercise Prescription Changes:   Exercise Prescription Changes     Row Name 08/21/22 1200 09/04/22 1200 09/18/22 1200 10/02/22 1200       Response to Exercise   Blood Pressure (Admit) 114/66 122/70 124/62 118/62    Blood Pressure (Exercise) 140/66 146/60 150/60 134/62    Blood Pressure (Exit) 102/64 108/66 118/60 112/68    Heart Rate (Admit) 75 bpm 75 bpm 73 bpm 71 bpm    Heart Rate (Exercise) 110 bpm 105 bpm 100 bpm 110 bpm    Heart Rate (Exit) 80 bpm 81 bpm 76 bpm 83 bpm    Oxygen Saturation (Admit) 98 % 96 % 97 % 96 %    Oxygen Saturation (Exercise) 92 % 92 % 94 % 89 %    Oxygen Saturation (Exit) 97 % 97 % 97 % 96 %    Rating of Perceived Exertion (Exercise) 15 13 14 13     Perceived Dyspnea (Exercise) 2 2 2 2     Symptoms DOE -- DOE --    Duration Progress to 30 minutes of  aerobic without signs/symptoms of physical distress Progress to 30 minutes of  aerobic without signs/symptoms of physical distress Continue with 30 min of aerobic exercise without signs/symptoms of physical distress. Continue with 30 min of aerobic exercise without signs/symptoms of physical distress.    Intensity THRR unchanged THRR unchanged THRR unchanged THRR unchanged      Progression   Progression Continue to progress workloads to maintain intensity without signs/symptoms of physical distress. Continue to progress workloads to maintain intensity without signs/symptoms of physical distress. Continue to progress workloads to maintain intensity without  signs/symptoms of physical distress. Continue to progress workloads to maintain intensity without signs/symptoms of physical distress.    Average METs 2.3 2.5 -- --      Resistance Training   Training Prescription Yes Yes Yes Yes    Weight blue bands blue bands blue bands blue bands    Reps 10-15 10-15 10-15 10-15    Time -- 10 Minutes 10 Minutes 10 Minutes      Interval Training   Interval Training -- No -- --      Oxygen   Oxygen Continuous Continuous Continuous Continuous    Liters 10 10 10  8-10      Treadmill   MPH 1.8 2 2 2     Grade 0 0 1 1    Minutes 15 15 15 15     METs 2.38 2.53 2.81 2.81      Recumbant Elliptical   Level 2 2 3 3     RPM 40 -- 40 --    Minutes 15 15 15 15     METs 2.1 2.5 2.6 2.3      Oxygen   Maintain Oxygen Saturation 88% or  higher 88% or higher 88% or higher 88% or higher             Exercise Comments:   Exercise Comments     Row Name 08/21/22 1155 10/02/22 1539         Exercise Comments Pt completed first day of group exercise. She exercised on the recumbent elliptical for 15 min, level2, METs 2.1. She then walked on the TM for 15 min, speed 1.8 for 12 min, 1.2 mph for 3 min. Tolerated fair. She tolerated warm up and cool down well, performed squats. Discussed METs. Pt is generally overwhelmed by her diagnosis but is managing. Completed home ExRx. Hannah Klein is currently walking at home. She walks 1 non-rehab day/wk for 10-15 min/day. We discussed Lung Transplant guidelines and building to meeting those guidelines. I mentioned walking 3 non-rehab days/wk for at least 20 min/day. She agreed with my recommendations. I discussed increasing her walking time first and then frequency. I recommended doing a slow progression to meeting transplant guidelines. Hannah Klein agreed to my progression plan. We also discussed exercise strategies as Hannah Klein is walking outside. She is willing to try going to the local [employer] to avoid the heat and humidity. I discussed  weather guidelines with [name]. She voiced understanding. She seems motivated to exercise and increase her functional capacity.               Exercise Goals and Review:   Exercise Goals     Row Name 08/15/22 1610 08/31/22 1011 09/21/22 1346         Exercise Goals   Increase Physical Activity Yes Yes Yes     Intervention Provide advice, education, support and counseling about physical activity/exercise needs.;Develop an individualized exercise prescription for aerobic and resistive training based on initial evaluation findings, risk stratification, comorbidities and participant's personal goals. Provide advice, education, support and counseling about physical activity/exercise needs.;Develop an individualized exercise prescription for aerobic and resistive training based on initial evaluation findings, risk stratification, comorbidities and participant's personal goals. Provide advice, education, support and counseling about physical activity/exercise needs.;Develop an individualized exercise prescription for aerobic and resistive training based on initial evaluation findings, risk stratification, comorbidities and participant's personal goals.     Expected Outcomes Short Term: Attend rehab on a regular basis to increase amount of physical activity.;Long Term: Exercising regularly at least 3-5 days a week.;Long Term: Add in home exercise to make exercise part of routine and to increase amount of physical activity. Short Term: Attend rehab on a regular basis to increase amount of physical activity.;Long Term: Exercising regularly at least 3-5 days a week.;Long Term: Add in home exercise to make exercise part of routine and to increase amount of physical activity. Short Term: Attend rehab on a regular basis to increase amount of physical activity.;Long Term: Exercising regularly at least 3-5 days a week.;Long Term: Add in home exercise to make exercise part of routine and to increase amount of  physical activity.     Increase Strength and Stamina Yes Yes Yes     Intervention Provide advice, education, support and counseling about physical activity/exercise needs.;Develop an individualized exercise prescription for aerobic and resistive training based on initial evaluation findings, risk stratification, comorbidities and participant's personal goals. Provide advice, education, support and counseling about physical activity/exercise needs.;Develop an individualized exercise prescription for aerobic and resistive training based on initial evaluation findings, risk stratification, comorbidities and participant's personal goals. Provide advice, education, support and counseling about physical activity/exercise needs.;Develop an individualized exercise prescription for aerobic  and resistive training based on initial evaluation findings, risk stratification, comorbidities and participant's personal goals.     Expected Outcomes Short Term: Increase workloads from initial exercise prescription for resistance, speed, and METs.;Short Term: Perform resistance training exercises routinely during rehab and add in resistance training at home;Long Term: Improve cardiorespiratory fitness, muscular endurance and strength as measured by increased METs and functional capacity ( ) Short Term: Increase workloads from initial exercise prescription for resistance, speed, and METs.;Short Term: Perform resistance training exercises routinely during rehab and add in resistance training at home;Long Term: Improve cardiorespiratory fitness, muscular endurance and strength as measured by increased METs and functional capacity ( ) Short Term: Increase workloads from initial exercise prescription for resistance, speed, and METs.;Short Term: Perform resistance training exercises routinely during rehab and add in resistance training at home;Long Term: Improve cardiorespiratory fitness, muscular endurance and strength as measured  by increased METs and functional capacity ( )     Able to understand and use rate of perceived exertion (RPE) scale Yes Yes Yes     Intervention Provide education and explanation on how to use RPE scale Provide education and explanation on how to use RPE scale Provide education and explanation on how to use RPE scale     Expected Outcomes Short Term: Able to use RPE daily in rehab to express subjective intensity level;Long Term:  Able to use RPE to guide intensity level when exercising independently Short Term: Able to use RPE daily in rehab to express subjective intensity level;Long Term:  Able to use RPE to guide intensity level when exercising independently Short Term: Able to use RPE daily in rehab to express subjective intensity level;Long Term:  Able to use RPE to guide intensity level when exercising independently     Able to understand and use Dyspnea scale Yes Yes Yes     Intervention Provide education and explanation on how to use Dyspnea scale Provide education and explanation on how to use Dyspnea scale Provide education and explanation on how to use Dyspnea scale     Expected Outcomes Short Term: Able to use Dyspnea scale daily in rehab to express subjective sense of shortness of breath during exertion;Long Term: Able to use Dyspnea scale to guide intensity level when exercising independently Short Term: Able to use Dyspnea scale daily in rehab to express subjective sense of shortness of breath during exertion;Long Term: Able to use Dyspnea scale to guide intensity level when exercising independently Short Term: Able to use Dyspnea scale daily in rehab to express subjective sense of shortness of breath during exertion;Long Term: Able to use Dyspnea scale to guide intensity level when exercising independently     Knowledge and understanding of Target Heart Rate Range (THRR) Yes Yes Yes     Intervention Provide education and explanation of THRR including how the numbers were predicted and where  they are located for reference Provide education and explanation of THRR including how the numbers were predicted and where they are located for reference Provide education and explanation of THRR including how the numbers were predicted and where they are located for reference     Expected Outcomes Short Term: Able to state/look up THRR;Long Term: Able to use THRR to govern intensity when exercising independently;Short Term: Able to use daily as guideline for intensity in rehab Short Term: Able to state/look up THRR;Long Term: Able to use THRR to govern intensity when exercising independently;Short Term: Able to use daily as guideline for intensity in rehab Short Term: Able to state/look up  THRR;Long Term: Able to use THRR to govern intensity when exercising independently;Short Term: Able to use daily as guideline for intensity in rehab     Understanding of Exercise Prescription Yes Yes Yes     Intervention Provide education, explanation, and written materials on patient's individual exercise prescription Provide education, explanation, and written materials on patient's individual exercise prescription Provide education, explanation, and written materials on patient's individual exercise prescription     Expected Outcomes Short Term: Able to explain program exercise prescription;Long Term: Able to explain home exercise prescription to exercise independently Short Term: Able to explain program exercise prescription;Long Term: Able to explain home exercise prescription to exercise independently Short Term: Able to explain program exercise prescription;Long Term: Able to explain home exercise prescription to exercise independently              Exercise Goals Re-Evaluation :  Exercise Goals Re-Evaluation     Row Name 08/31/22 1011 09/21/22 1346           Exercise Goal Re-Evaluation   Exercise Goals Review Increase Physical Activity;Able to understand and use Dyspnea scale;Understanding of Exercise  Prescription;Increase Strength and Stamina;Knowledge and understanding of Target Heart Rate Range (THRR);Able to understand and use rate of perceived exertion (RPE) scale Increase Physical Activity;Able to understand and use Dyspnea scale;Understanding of Exercise Prescription;Increase Strength and Stamina;Knowledge and understanding of Target Heart Rate Range (THRR);Able to understand and use rate of perceived exertion (RPE) scale      Comments Hannah Klein has completed 2 exercise sessions. She exercises for 15 min on the recumbent elliptical and treadmill. She averages 2.5 METs at level 2 on the recumbent elliptical and 2.53 METs at 2 mph and 0% incline. She performs the warmup and cooldown standing without limitations. It is too soon to note any discernable progressions. Will continue to monitor and progress as able. Hannah Klein has completed 8 exercise sessions. She exercises for 15 min on the recumbent elliptical and treadmill. She averages 2.5 METs at level 3 on the recumbent elliptical and 2.81 METs at 2 mph and 1% incline. She performs the warmup and cooldown standing without limitations. Hannah Klein has increased her workload for both exercise modes as METs have increased. She tolerates progressions well. Hannah Klein seems motivated to exercise and improve her functional capacity. She enjoys class as she converses with other patients. Will continue to monitor and progress as able.      Expected Outcomes Through exercise at rehab and home, the patient will decrease shortness of breath with daily activities and feel confident in carrying out an exercise regimen at home. Through exercise at rehab and home, the patient will decrease shortness of breath with daily activities and feel confident in carrying out an exercise regimen at home.               Discharge Exercise Prescription (Final Exercise Prescription Changes):  Exercise Prescription Changes - 10/02/22 1200       Response to Exercise   Blood Pressure  (Admit) 118/62    Blood Pressure (Exercise) 134/62    Blood Pressure (Exit) 112/68    Heart Rate (Admit) 71 bpm    Heart Rate (Exercise) 110 bpm    Heart Rate (Exit) 83 bpm    Oxygen Saturation (Admit) 96 %    Oxygen Saturation (Exercise) 89 %    Oxygen Saturation (Exit) 96 %    Rating of Perceived Exertion (Exercise) 13    Perceived Dyspnea (Exercise) 2    Duration Continue with 30 min of aerobic  exercise without signs/symptoms of physical distress.    Intensity THRR unchanged      Progression   Progression Continue to progress workloads to maintain intensity without signs/symptoms of physical distress.      Resistance Training   Training Prescription Yes    Weight blue bands    Reps 10-15    Time 10 Minutes      Oxygen   Oxygen Continuous    Liters 8-10      Treadmill   MPH 2    Grade 1    Minutes 15    METs 2.81      Recumbant Elliptical   Level 3    Minutes 15    METs 2.3      Oxygen   Maintain Oxygen Saturation 88% or higher             Nutrition:  Target Goals: Understanding of nutrition guidelines, daily intake of sodium 1500mg , cholesterol 200mg , calories 30% from fat and 7% or less from saturated fats, daily to have 5 or more servings of fruits and vegetables.  Biometrics:    Nutrition Therapy Plan and Nutrition Goals:  Nutrition Therapy & Goals - 09/18/22 1153       Nutrition Therapy   Diet heart healthy diet    Drug/Food Interactions Statins/Certain Fruits      Personal Nutrition Goals   Nutrition Goal Patient to improve diet quality by using the plate method as a guide for meal planning to include lean protein/plant protein, fruits, vegetables, whole grains, nonfat dairy as part of a well-balanced diet.   goal in progress.   Personal Goal #2 Patient to identify strategies for weight loss of 0.5-2.0# per week.   goal not met.   Personal Goal #3 Patient to identify food sources and limit daily intake of saturated fat, tran fat, sodium, and  refined carbohdyrates.   goal in progress.   Comments Goals in progress. We have discussed multiple strategies for weight loss including increasing low calorie dense foods, protein supplements, the plate method as a guide for meal planning, mindful eating strategies, and tracking intake. She has not met weight loss goals and continues to struggle with oversnacking/boredom type snacking. She is up 6.8# since starting with our program. Hannah Klein is currently working toward lung transplant through [place]. Per documenation, 6/13, she is working toward an initial weight loss goal of 203# and ultimatley a BMI of 27-28. She reports that multiple weight loss drugs including wegovy have been denied by her insurance company. She is taking prednisone.  Patient will benefit from participation in pulmonary rehab for nutrition, exercise, and lifestyle modification.      Intervention Plan   Intervention Prescribe, educate and counsel regarding individualized specific dietary modifications aiming towards targeted core components such as weight, hypertension, lipid management, diabetes, heart failure and other comorbidities.;Nutrition handout(s) given to patient.    Expected Outcomes Short Term Goal: Understand basic principles of dietary content, such as calories, fat, sodium, cholesterol and nutrients.;Long Term Goal: Adherence to prescribed nutrition plan.;Short Term Goal: A plan has been developed with personal nutrition goals set during dietitian appointment.             Nutrition Assessments:  MEDIFICTS Score Key: ?70 Need to make dietary changes  40-70 Heart Healthy Diet ? 40 Therapeutic Level Cholesterol Diet   Picture Your Plate Scores: <13 Unhealthy dietary pattern with much room for improvement. 41-50 Dietary pattern unlikely to meet recommendations for good health and room for improvement. 51-60  More healthful dietary pattern, with some room for improvement.  >60 Healthy dietary pattern, although  there may be some specific behaviors that could be improved.    Nutrition Goals Re-Evaluation:  Nutrition Goals Re-Evaluation     Row Name 08/21/22 1055 09/18/22 1153           Goals   Current Weight 235 lb 7.2 oz (106.8 kg) 242 lb 4.6 oz (109.9 kg)      Comment A1c WNL, cholesterol 247, triglycerides 252 no new labs; most recent labs A1c WNL, cholesterol 247, triglycerides 25      Expected Outcome Hannah Klein is currently working toward lung transplant through [place]. Per documenation, 6/13, she is working toward an initial weight loss goal of 203# and ultimatley a BMI of 27-28. She reports that multiple weight loss drugs including wegovy have been denied by her insurance company. She is taking prednisone. She has not started making many dietary changes; she continues snacking on pork rinds and twizzlers and eats out 1-2x per week including sweet tea at these meals. Patient will benefit from participation in pulmonary rehab for nutrition, exercise, and lifestyle modification. Goals in progress. We have discussed multiple strategies for weight loss including increasing low calorie dense foods, protein supplements, the plate method as a guide for meal planning, mindful eating strategies, and tracking intake. She has not met weight loss goals and continues to struggle with oversnacking/boredom type snacking. She is up 6.8# since starting with our program. Hannah Klein is currently working toward lung transplant through [place]. Per documenation, 6/13, she is working toward an initial weight loss goal of 203# and ultimatley a BMI of 27-28. She reports that multiple weight loss drugs including wegovy have been denied by her insurance company. She is taking prednisone. Patient will benefit from participation in pulmonary rehab for nutrition, exercise, and lifestyle modification.               Nutrition Goals Discharge (Final Nutrition Goals Re-Evaluation):  Nutrition Goals Re-Evaluation - 09/18/22 1153        Goals   Current Weight 242 lb 4.6 oz (109.9 kg)    Comment no new labs; most recent labs A1c WNL, cholesterol 247, triglycerides 25    Expected Outcome Goals in progress. We have discussed multiple strategies for weight loss including increasing low calorie dense foods, protein supplements, the plate method as a guide for meal planning, mindful eating strategies, and tracking intake. She has not met weight loss goals and continues to struggle with oversnacking/boredom type snacking. She is up 6.8# since starting with our program. Hannah Klein is currently working toward lung transplant through [place]. Per documenation, 6/13, she is working toward an initial weight loss goal of 203# and ultimatley a BMI of 27-28. She reports that multiple weight loss drugs including wegovy have been denied by her insurance company. She is taking prednisone. Patient will benefit from participation in pulmonary rehab for nutrition, exercise, and lifestyle modification.             Psychosocial: Target Goals: Acknowledge presence or absence of significant depression and/or stress, maximize coping skills, provide positive support system. Participant is able to verbalize types and ability to use techniques and skills needed for reducing stress and depression.  Initial Review & Psychosocial Screening:  Initial Psych Review & Screening - 08/15/22 4098       Initial Review   Current issues with Current Anxiety/Panic    Comments Pt is on medication for anixety. Feels that anxiety is controlled.  Family Dynamics   Good Support System? Yes    Comments mother, 3 brothers, step mother, and friends      Barriers   Psychosocial barriers to participate in program There are no identifiable barriers or psychosocial needs.      Screening Interventions   Interventions Encouraged to exercise             Quality of Life Scores:  Scores of 19 and below usually indicate a poorer quality of life in these areas.  A  difference of  2-3 points is a clinically meaningful difference.  A difference of 2-3 points in the total score of the Quality of Life Index has been associated with significant improvement in overall quality of life, self-image, physical symptoms, and general health in studies assessing change in quality of life.  PHQ-9: Review Flowsheet       08/15/2022 08/14/2022 06/09/2021  Depression screen PHQ 2/9  Decreased Interest 1 1 1   Down, Depressed, Hopeless 0 0 0  PHQ - 2 Score 1 1 1   Altered sleeping 0 - 1  Tired, decreased energy 0 - 1  Change in appetite 1 - 0  Feeling bad or failure about yourself  0 - 0  Trouble concentrating 0 - 0  Moving slowly or fidgety/restless 0 - 0  Suicidal thoughts 0 - 0  PHQ-9 Score 2 - 3  Difficult doing work/chores Not difficult at all - Not difficult at all    Details           Interpretation of Total Score  Total Score Depression Severity:  1-4 = Minimal depression, 5-9 = Mild depression, 10-14 = Moderate depression, 15-19 = Moderately severe depression, 20-27 = Severe depression   Psychosocial Evaluation and Intervention:  Psychosocial Evaluation - 08/15/22 0919       Psychosocial Evaluation & Interventions   Interventions Encouraged to exercise with the program and follow exercise prescription    Comments Hannah Klein denies any psychosocial barriers to participating in rehab. Will continue to monitor.    Expected Outcomes For [name] to participate in rehab free of psychosocial barriers.    Continue Psychosocial Services  No Follow up required             Psychosocial Re-Evaluation:  Psychosocial Re-Evaluation     Row Name 09/03/22 0919 09/28/22 0910           Psychosocial Re-Evaluation   Current issues with None Identified None Identified      Comments Hannah Klein denies any new psychosocial barriers or concerns at this time. Hannah Klein continues to deny any psychosocial barriers or concerns at this time.      Expected Outcomes For  Hannah Klein to continue participating in PR free of any psychosocial barriers or concerns For Hannah Klein to continue participating in PR free of any psychosocial barriers or concerns      Interventions Encouraged to attend Pulmonary Rehabilitation for the exercise Encouraged to attend Pulmonary Rehabilitation for the exercise      Continue Psychosocial Services  No Follow up required No Follow up required               Psychosocial Discharge (Final Psychosocial Re-Evaluation):  Psychosocial Re-Evaluation - 09/28/22 0910       Psychosocial Re-Evaluation   Current issues with None Identified    Comments Hannah Klein continues to deny any psychosocial barriers or concerns at this time.    Expected Outcomes For Hannah Klein to continue participating in PR free of any psychosocial barriers or concerns  Interventions Encouraged to attend Pulmonary Rehabilitation for the exercise    Continue Psychosocial Services  No Follow up required             Education: Education Goals: Education classes will be provided on a weekly basis, covering required topics. Participant will state understanding/return demonstration of topics presented.  Learning Barriers/Preferences:  Learning Barriers/Preferences - 08/15/22 0920       Learning Barriers/Preferences   Learning Barriers Sight   wears glasses   Learning Preferences Group Instruction;Individual Instruction;Skilled Demonstration             Education Topics: Introduction to Pulmonary Rehab Group instruction provided by PowerPoint, verbal discussion, and written material to support subject matter. Instructor reviews what Pulmonary Rehab is, the purpose of the program, and how patients are referred.     Know Your Numbers Group instruction that is supported by a PowerPoint presentation. Instructor discusses importance of knowing and understanding resting, exercise, and post-exercise oxygen saturation, heart rate, and blood pressure. Oxygen saturation,  heart rate, blood pressure, rating of perceived exertion, and dyspnea are reviewed along with a normal range for these values.  Flowsheet Row PULMONARY REHAB OTHER RESPIRATORY from 09/06/2022 in [facility]  Date 09/06/22  Educator EP  Instruction Review Code 1- Verbalizes Understanding       Exercise for the Pulmonary Patient Group instruction that is supported by a PowerPoint presentation. Instructor discusses benefits of exercise, core components of exercise, frequency, duration, and intensity of an exercise routine, importance of utilizing pulse oximetry during exercise, safety while exercising, and options of places to exercise outside of rehab.  Flowsheet Row PULMONARY REHAB OTHER RESPIRATORY from 08/23/2022 in [facility]  Date 08/23/22  Educator EP  Instruction Review Code 1- Verbalizes Understanding          MET Level  Group instruction provided by PowerPoint, verbal discussion, and written material to support subject matter. Instructor reviews what METs are and how to increase METs.    Pulmonary Medications Verbally interactive group education provided by instructor with focus on inhaled medications and proper administration.   Anatomy and Physiology of the Respiratory System Group instruction provided by PowerPoint, verbal discussion, and written material to support subject matter. Instructor reviews respiratory cycle and anatomical components of the respiratory system and their functions. Instructor also reviews differences in obstructive and restrictive respiratory diseases with examples of each.    Oxygen Safety Group instruction provided by PowerPoint, verbal discussion, and written material to support subject matter. There is an overview of "What is Oxygen" and "Why do we need it".  Instructor also reviews how to create a safe environment for oxygen  use, the importance of using oxygen as prescribed, and the risks of noncompliance. There is a brief discussion on traveling with oxygen and resources the patient may utilize. Flowsheet Row PULMONARY REHAB OTHER RESPIRATORY from 09/13/2022 in [facility]  Date 09/13/22  Educator RN  Instruction Review Code 1- Verbalizes Understanding       Oxygen Use Group instruction provided by PowerPoint, verbal discussion, and written material to discuss how supplemental oxygen is prescribed and different types of oxygen supply systems. Resources for more information are provided.  Flowsheet Row PULMONARY REHAB OTHER RESPIRATORY from 09/20/2022 in [facility]  Date 09/20/22  Educator RT  Instruction Review  Code 1- Verbalizes Understanding       Breathing Techniques Group instruction that is supported by demonstration and informational handouts. Instructor discusses the benefits of pursed lip and diaphragmatic breathing and detailed demonstration on how to perform both.     Risk Factor Reduction Group instruction that is supported by a PowerPoint presentation. Instructor discusses the definition of a risk factor, different risk factors for pulmonary disease, and how the heart and lungs work together.   MD Day A group question and answer session with a medical doctor that allows participants to ask questions that relate to their pulmonary disease state.   Nutrition for the Pulmonary Patient Group instruction provided by PowerPoint slides, verbal discussion, and written materials to support subject matter. The instructor gives an explanation and review of healthy diet recommendations, which includes a discussion on weight management, recommendations for fruit and vegetable consumption, as well as protein, fluid, caffeine, fiber, sodium, sugar, and alcohol. Tips for eating when patients  are short of breath are discussed.    Other Education Group or individual verbal, written, or video instructions that support the educational goals of the pulmonary rehab program.    Knowledge Questionnaire Score:  Knowledge Questionnaire Score - 08/15/22 0929       Knowledge Questionnaire Score   Pre Score 18/18             Core Components/Risk Factors/Patient Goals at Admission:  Personal Goals and Risk Factors at Admission - 08/15/22 0920       Core Components/Risk Factors/Patient Goals on Admission    Weight Management Yes;Weight Loss    Intervention Weight Management: Develop a combined nutrition and exercise program designed to reach desired caloric intake, while maintaining appropriate intake of nutrient and fiber, sodium and fats, and appropriate energy expenditure required for the weight goal.;Weight Management: Provide education and appropriate resources to help participant work on and attain dietary goals.;Weight Management/Obesity: Establish reasonable short term and long term weight goals.;Obesity: Provide education and appropriate resources to help participant work on and attain dietary goals.    Expected Outcomes Short Term: Continue to assess and modify interventions until short term weight is achieved;Long Term: Adherence to nutrition and physical activity/exercise program aimed toward attainment of established weight goal;Weight Maintenance: Understanding of the daily nutrition guidelines, which includes 25-35% calories from fat, 7% or less cal from saturated fats, less than 200mg  cholesterol, less than 1.5gm of sodium, & 5 or more servings of fruits and vegetables daily;Weight Loss: Understanding of general recommendations for a balanced deficit meal plan, which promotes 1-2 lb weight loss per week and includes a negative energy balance of [phone] kcal/d;Understanding recommendations for meals to include 15-35% energy as protein, 25-35% energy from fat, 35-60% energy  from carbohydrates, less than 200mg  of dietary cholesterol, 20-35 gm of total fiber daily;Understanding of distribution of calorie intake throughout the day with the consumption of 4-5 meals/snacks    Improve shortness of breath with ADL's Yes    Intervention Provide education, individualized exercise plan and daily activity instruction to help decrease symptoms of SOB with activities of daily living.    Expected Outcomes Short Term: Improve cardiorespiratory fitness to achieve a reduction of symptoms when performing ADLs;Long Term: Be able to perform more ADLs without symptoms or delay the onset of symptoms             Core Components/Risk Factors/Patient Goals Review:   Goals and Risk Factor Review     Row Name 09/03/22 [phone] 09/28/22 [phone]  Core Components/Risk Factors/Patient Goals Review   Personal Goals Review Weight Management/Obesity;Improve shortness of breath with ADL's Weight Management/Obesity;Improve shortness of breath with ADL's      Review Hannah Klein has recently started the program. She is working with staff dietician to achieve her weight loss goals. She is able to demonstrate purse lip breathing when she gets short of breath. She still gets short of breath when performing her ADL's. Hannah Klein is enjoying the program so far. We will continue to monitor her progress. Goal progressing for weight loss. Hannah Klein has been working with our dietician for weight loss goals. Goal progressing on improving her shortness of breath with ADLs. Hannah Klein is being worked up for lung transplant and remains optimistic through this process. We will continue to monitor her progress throughout the program.      Expected Outcomes See admission goals. See admission goals.               Core Components/Risk Factors/Patient Goals at Discharge (Final Review):   Goals and Risk Factor Review - 09/28/22 0913       Core Components/Risk Factors/Patient Goals Review   Personal Goals Review Weight  Management/Obesity;Improve shortness of breath with ADL's    Review Goal progressing for weight loss. Hannah Klein has been working with our dietician for weight loss goals. Goal progressing on improving her shortness of breath with ADLs. Hannah Klein is being worked up for lung transplant and remains optimistic through this process. We will continue to monitor her progress throughout the program.    Expected Outcomes See admission goals.             ITP Comments:Pt is making expected progress toward Pulmonary Rehab goals after completing 10 sessions. Recommend continued exercise, life style modification, education, and utilization of breathing techniques to increase stamina and strength, while also decreasing shortness of breath with exertion.  Dr. Mechele Collin is Medical Director for Pulmonary Rehab at [facility].     Comments: Dr. Mechele Collin is Medical Director for Pulmonary Rehab at [facility].

## 2022-10-04 ENCOUNTER — Encounter (HOSPITAL_COMMUNITY): Payer: Commercial Managed Care - HMO

## 2022-10-08 ENCOUNTER — Other Ambulatory Visit: Payer: Self-pay | Admitting: Primary Care

## 2022-10-08 ENCOUNTER — Other Ambulatory Visit: Payer: Self-pay | Admitting: Pulmonary Disease

## 2022-10-08 NOTE — Telephone Encounter (Signed)
I called and discussed with the patient. We reviewed the CT scan and she states that chest pain is better.Clinic follow up is scheduled for 8/14. Nothing further needed

## 2022-10-09 ENCOUNTER — Encounter (HOSPITAL_COMMUNITY)
Admission: RE | Admit: 2022-10-09 | Discharge: 2022-10-09 | Disposition: A | Discharge status: Home / Self Care | Payer: Commercial Managed Care - HMO | Source: Ambulatory Visit | Attending: Pulmonary Disease | Admitting: Pulmonary Disease

## 2022-10-09 DIAGNOSIS — J849 Interstitial pulmonary disease, unspecified: Secondary | ICD-10-CM | POA: Diagnosis not present

## 2022-10-09 NOTE — Progress Notes (Signed)
Daily Session Note  Patient Details  Name: Hannah Klein MRN: [id number] Date of Birth: 11-25-1975 Referring Provider:   Doristine Devoid Pulmonary Rehab Walk Test from 08/15/2022 in [facility] for Heart, Vascular, & Lung Health  Referring Provider Soskis  [sees [name]]       Encounter Date: 10/09/2022  Check In:  Session Check In - 10/09/22 1207       Check-In   Supervising physician immediately available to respond to emergencies [facility] MD immediately available    Physician(s) Jari Favre PA    [facility]    Staff Present Durel Salts, RT; Idelle Crouch BS, ACSM-CEP, Exercise Physiologist;Samantha Belarus, RD, Dutch Gray, RN, Doris Cheadle, MS, ACSM-CEP, Exercise Physiologist    Virtual Visit No    Medication changes reported     No    Fall or balance concerns reported    No    Tobacco Cessation No Change    Warm-up and Cool-down Performed as group-led instruction    Resistance Training Performed Yes    VAD Patient? No    PAD/SET Patient? No      Pain Assessment   Currently in Pain? No/denies             Capillary Blood Glucose: No results found for this or any previous visit (from the past 24 hour(s)).    Social History   Tobacco Use  Smoking Status Former   Current packs/day: 0.00   Average packs/day: 2.0 packs/day for 18.0 years (36.1 ttl pk-yrs)   Types: Cigarettes   Start date: 57   Quit date: 03/16/2007   Years since quitting: 15.5   Passive exposure: Current  Smokeless Tobacco Never    Goals Met:  Independence with exercise equipment Exercise tolerated well No report of concerns or symptoms today Strength training completed today  Goals Unmet:  Not Applicable  Comments: Service time is from 1012 to 1138.    Dr. Mechele Collin is Medical Director for Pulmonary Rehab at [facility].

## 2022-10-10 ENCOUNTER — Ambulatory Visit (INDEPENDENT_AMBULATORY_CARE_PROVIDER_SITE_OTHER): Payer: Commercial Managed Care - HMO | Admitting: Pulmonary Disease

## 2022-10-10 ENCOUNTER — Encounter: Payer: Self-pay | Admitting: Pulmonary Disease

## 2022-10-10 ENCOUNTER — Other Ambulatory Visit: Payer: Self-pay | Admitting: Pharmacy Technician

## 2022-10-10 ENCOUNTER — Other Ambulatory Visit: Payer: Self-pay

## 2022-10-10 VITALS — BP 132/80 | HR 86 | Temp 98.6°F | Ht 69.0 in | Wt 243.2 lb

## 2022-10-10 DIAGNOSIS — Z5181 Encounter for therapeutic drug level monitoring: Secondary | ICD-10-CM | POA: Diagnosis not present

## 2022-10-10 DIAGNOSIS — J849 Interstitial pulmonary disease, unspecified: Secondary | ICD-10-CM

## 2022-10-10 NOTE — Patient Instructions (Signed)
Will proceed with the next dose of Cytoxan Reduce prednisone to 10 mg a day for a month and then 5 mg a day.  Hold at that dose until he can be seen in clinic Return to clinic in 1 month.

## 2022-10-10 NOTE — Progress Notes (Addendum)
Hannah Klein    [id number]    Oct 05, 1975  Primary Care Physician:Clark, Keane Scrape, NP  Referring Physician: Doreene Nest, NP [address] 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 who  has a past medical history of Arthritis, Asthma, Environmental allergies, Esophageal dysphagia, Generalized anxiety disorder, GERD (gastroesophageal reflux disease), History of hiatal hernia, colonic polyps, Hypertension, Lung disorder, Migraines, MRSA (methicillin resistant Staphylococcus aureus), and Sleep apnea.   Sent to the ILD clinic by Dr. Delton Coombes for evaluation of interstitial lung disease.  She has history of severe persistent asthma for many years and is followed by Dr. Dellis Anes.  She had been on tezpire for 4 months in 2023.  Last dose of tezpire is in 2024.  In spite of the biologic she had significant dyspnea and noted to have a D-dimer.  She had a CTA which showed groundglass opacities concerning for pneumonitis/pneumonia and was admitted on 01/25/2022.  Treated with antibiotics, prednisone taper with minimal improvement.  She subsequently had surgical lung biopsy Dr. Cliffton Asters in early January 2024 with pathology showing nonspecific pneumonitis/pulmonary fibrosis.  Workup in the past shows borderline elevated rheumatoid factor and she has rheumatology evaluation pending for later this year.  Currently on 30 mg of prednisone which is started on March 29, 2022 with some improvement in symptoms  She is currently on performist and Pulmicort nebulizers.  History also notable for hiatal hernia, esophageal dysphagia and she follows with Dr. Marina Goodell.  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 Consulted with Dr. 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  No feather pillows or comforters ILD questionnaire 04/18/2022-negative Smoking history: 36-pack-year smoker.  Quit in 2009 Travel history: Originally from [place].  No significant recent travel Relevant family history: Brother does have asthma  Interim history: Cytoxan was initiated as her ILD was felt to be rapidly progressive and she got her third dose earlier this month Her Cytoxan has been tapered off and prednisone is down to 15 mg/day  She is here for follow-up of CT scan  Evaluated by [facility] rheumatology who agrees with Cytoxan.  They want to get her next dose here and then will take over Cytoxan therapy to be given at [facility].  They are planning on total 6 months of therapy.  Outpatient Encounter Medications as of 10/10/2022  Medication Sig   acetaminophen (TYLENOL) 500 MG tablet Take 2 tablets (1,000 mg total) by mouth every 6 (six) hours as needed.   albuterol (VENTOLIN HFA) 108 (90 Base) MCG/ACT inhaler INHALE 1-2 PUFFS BY MOUTH EVERY 6 HOURS AS NEEDED FOR WHEEZE OR SHORTNESS OF BREATH   Azelastine HCl 137 MCG/SPRAY SOLN PLACE 2 SPRAYS INTO BOTH NOSTRILS 2 (TWO) TIMES DAILY. USE IN EACH NOSTRIL AS DIRECTED   B Complex-C (B-COMPLEX WITH VITAMIN C) tablet Take 1 tablet by mouth daily.   Cholecalciferol (VITAMIN D3) 125 MCG (5000 UT) TABS Take 5,000  Units by mouth daily.   clobetasol ointment (TEMOVATE) 0.05 % Apply 1 Application topically 2 (two) times daily.   dapsone 25 MG tablet TAKE 2 TABLETS BY MOUTH 2 TIMES DAILY.   diclofenac Sodium (VOLTAREN ARTHRITIS PAIN) 1 %  GEL Apply 2 g topically daily as needed (Chest pain).   esomeprazole (NEXIUM) 40 MG capsule TAKE 1 CAPSULE (40 MG TOTAL) BY MOUTH 2 (TWO) TIMES DAILY. FOR HEARTBURN.   famotidine (PEPCID) 40 MG tablet Take 1 tablet (40 mg total) by mouth 2 (two) times daily. for heartburn.   hydrOXYzine (VISTARIL) 50 MG capsule TAKE 1 CAPSULE BY MOUTH ONCE OR TWICE DAILY AS NEEDED FOR ANXIETY   levocetirizine (XYZAL) 5 MG tablet Take 1 tablet (5 mg total) by mouth 2 (two) times daily. For allergies   Multiple Vitamin (MULTIVITAMIN) capsule Take 1 capsule by mouth daily.   mycophenolate (CELLCEPT) 500 MG tablet Take 3 tablets (1,500 mg total) by mouth 2 (two) times daily.   Omega-3 Fatty Acids (OMEGA-3 FISH OIL) 300 MG CAPS Take 1,000 mg by mouth.   ondansetron (ZOFRAN) 4 MG tablet Take 1 tablet (4 mg total) by mouth every 8 (eight) hours as needed for nausea or vomiting.   oxyCODONE-acetaminophen (PERCOCET) 7.5-325 MG tablet Take 1 tablet by mouth every 6 (six) hours as needed for up to 20 doses for severe pain.   predniSONE (DELTASONE) 10 MG tablet Take 2 tabs (20mg )  Twice daily (Patient taking differently: Take 15 mg by mouth daily with breakfast. Take 2 tabs (15mg )  daily)   predniSONE (DELTASONE) 5 MG tablet Take 1 tablet (5 mg total) by mouth daily with breakfast.   pregabalin (LYRICA) 25 MG capsule Take 25 mg by mouth 2 (two) times daily.   rosuvastatin (CRESTOR) 10 MG tablet TAKE 1 TABLET BY MOUTH EVERY DAY FOR CHOLESTEROL   sucralfate (CARAFATE) 1 g tablet Take 1 tablet (1 g total) by mouth in the morning, at noon, and at bedtime.   vitamin B-12 (CYANOCOBALAMIN) 1000 MCG tablet Take 1,000 mcg by mouth daily.   vitamin C (ASCORBIC ACID) 500 MG tablet Take 500 mg by mouth daily.   vitamin E 180 MG (400 UNITS) capsule Take 400 Units by mouth daily.   budesonide (PULMICORT) 0.25 MG/2ML nebulizer solution Take 0.25 mg by nebulization 2 (two) times daily. (Patient not taking: Reported on 09/12/2022)   formoterol  (PERFOROMIST) 20 MCG/2ML nebulizer solution Take 2 mLs (20 mcg total) by nebulization 2 (two) times daily. (Patient taking differently: Take 20 mcg by nebulization daily as needed (Shortness of breath).)   formoterol (PERFOROMIST) 20 MCG/2ML nebulizer solution Take 20 mcg by nebulization 2 (two) times daily. (Patient not taking: Reported on 09/12/2022)   mometasone-formoterol (DULERA) 200-5 MCG/ACT AERO Inhale 2 puffs into the lungs every 12 (twelve) hours. (Patient not taking: Reported on 09/12/2022)   No facility-administered encounter medications on file as of 10/10/2022.   Physical Exam: Blood pressure 132/80, pulse 86, temperature 98.6 F (37 C), temperature source Oral, height 5\' 9"  (1.753 m), weight 243 lb 3.2 oz (110.3 kg), SpO2 94%. Gen:      No acute distress HEENT:  EOMI, sclera anicteric Neck:     No masses; no thyromegaly Lungs:    Clear to auscultation bilaterally; normal respiratory effort CV:         Regular rate and rhythm; no murmurs Abd:      + bowel sounds; soft, non-tender; no palpable masses, no distension Ext:    No edema; adequate  peripheral perfusion Skin:      Warm and dry; no rash Neuro: alert and oriented x 3 Psych: normal mood and affect   Data Reviewed: Imaging: High resolution CT 04/11/2020-clear lungs with 4 mm lower lobe nodule Super D CT 04/07/2021-patchy groundglass opacities in the mid to lower lungs.  Stable lung nodules CTA 01/25/2022-no PE, moderate bilateral peripheral predominant groundglass opacities CT chest 02/22/2022-progressive interstitial lung disease with patchy groundglass, subpleural reticulation, traction bronchiectasis bilaterally with basal gradient.  Probable UIP pattern High resolution CT 04/25/2022-increasingly organized return of groundglass opacities with subpleural reticular densities, traction bronchiectasis.  Indeterminate for UIP High resolution CT 09/07/2022-slight interval progression of interstitial lung disease CT head and neck  09/07/2022-unremarkable I have reviewed the images personally.  PFTs: 03/07/2022 FVC 1.77 [41%], FEV1 1.60 [47%], F/F91, DLCO 7.03 [28%] Severe restrictive physiology.  Severe diffusion impairment.  Labs: Rheumatoid factor 01/25/2022- 41 ANA 01/25/2022-negative CCP 01/26/2022 less than 1 ANCA 08/03/2021-negative  CTD serologies 04/18/2022 ANA 1: 1280 ENA RNP 1.2, SSA greater than 200  Surgical lung biopsy 03/08/2022 No unclassifiable interstitial, chronic fibroinflammatory process.  Monitoring labs CMP 07/30/2022-alk phos 38 WBC 07/30/2022-WBC 16.8, 87.6 neutrophils  Sleep: Overnight oximetry on CPAP, room air dated 06/20/2022 Duration of study 8 hours 40 minutes Time spent less than 88% - 2 hours, 50 minutes Nadir O2 sat of 74%  Cardiac: Echocardiogram 01/26/2022-LVEF 60-65%, normal RV systolic size and function,  Assessment:  Connective tissue disease ILD, Sjogren's syndrome Seen as a second opinion at [facility] who feels that she may have antisynthetase syndrome as well. 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  Due to rapid progression on CT scan and pathology findings with fibroblastic foci we felt that the phenotype is aggressive and recommended aggressive immunotherapy and addition of antifibrotic therapy.  Unfortunately Rituxan has been denied in spite of multiple appeals.  As she has aggressive disease with rapid worsening we have on Cytoxan. We applied for Ofev but has been denied by insurance.  This is temporarily on hold until we can get her immunosuppression optimized  Since she has received 3 doses of CellCept.  She will receive her full dose this month and then continue CellCept therapy at [facility] rheumatology who plan on 6 months of therapy She is off CellCept now.  Reduce prednisone to 10 mg/day for a month and then 5 mg/day.  Hold at that  dose until seen back in clinic We can attempt azathioprine instead CellCept in the future.  TPMT levels are normal  Continue transplant evaluation at [facility]    Neck pain She is complaining of neck and jaw pain after her most recent Cytoxan infusion.  CT head and neck are unremarkable  Leukocytosis Labs reviewed with elevation in WBC count.  She does not have any symptoms of infection and will continue to monitor this. Sputum cultures are done for some reason at [facility] recently and they are growing fungus.  Repeat cultures ordered but not done and follow-up CT chest does not suggest any ongoing infection.  Severe persistent asthma Off tezspire since November 2024 Continue Dulera.  She prefers the inhaler over to performist and Pulmicort nebulizer  OSA Continue CPAP with oxygen  Plan/Recommendations: Follow labs for monitoring Continue monthly Cytoxan Reduce prednisone to 10 mg/day for a month and then 5 mg/day  Chilton Greathouse MD [facility] 10/10/2022, 1:41 PM  CC: Doreene Nest,  NP

## 2022-10-11 ENCOUNTER — Encounter (INDEPENDENT_AMBULATORY_CARE_PROVIDER_SITE_OTHER): Payer: Self-pay

## 2022-10-11 ENCOUNTER — Encounter (HOSPITAL_COMMUNITY)
Admission: RE | Admit: 2022-10-11 | Discharge: 2022-10-11 | Disposition: A | Discharge status: Home / Self Care | Payer: Commercial Managed Care - HMO | Source: Ambulatory Visit | Attending: Pulmonary Disease | Admitting: Pulmonary Disease

## 2022-10-11 DIAGNOSIS — J849 Interstitial pulmonary disease, unspecified: Secondary | ICD-10-CM | POA: Diagnosis not present

## 2022-10-11 NOTE — Progress Notes (Signed)
Daily Session Note  Patient Details  Name: Hannah Klein MRN: [id number] Date of Birth: Dec 17, 1975 Referring Provider:   Doristine Devoid Pulmonary Rehab Walk Test from 08/15/2022 in [facility] for Heart, Vascular, & Lung Health  Referring Provider Soskis  [sees [name]]       Encounter Date: 10/11/2022  Check In:  Session Check In - 10/11/22 1216       Check-In   Supervising physician immediately available to respond to emergencies [facility] MD immediately available    Physician(s) Edd Fabian, NP    [facility]    Staff Present Durel Salts, RT;Randi Idelle Crouch BS, ACSM-CEP, Exercise Physiologist;Samantha Belarus, RD, Dutch Gray, RN, Doris Cheadle, MS, ACSM-CEP, Exercise Physiologist    Virtual Visit No    Medication changes reported     No    Fall or balance concerns reported    No    Tobacco Cessation No Change    Warm-up and Cool-down Performed as group-led instruction    Resistance Training Performed Yes    VAD Patient? No    PAD/SET Patient? No      Pain Assessment   Currently in Pain? No/denies    Pain Score 0-No pain    Multiple Pain Sites No             Capillary Blood Glucose: No results found for this or any previous visit (from the past 24 hour(s)).    Social History   Tobacco Use  Smoking Status Former   Current packs/day: 0.00   Average packs/day: 2.0 packs/day for 18.0 years (36.1 ttl pk-yrs)   Types: Cigarettes   Start date: 12   Quit date: 03/16/2007   Years since quitting: 15.5   Passive exposure: Current  Smokeless Tobacco Never    Goals Met:  Proper associated with RPD/PD & O2 Sat Independence with exercise equipment Exercise tolerated well No report of concerns or symptoms today Strength training completed today  Goals Unmet:  Not Applicable  Comments: Service time is from 1011 to 1155.    Dr. Mechele Collin is Medical Director for Pulmonary Rehab at [facility].

## 2022-10-16 ENCOUNTER — Encounter (HOSPITAL_COMMUNITY): Payer: Self-pay

## 2022-10-16 ENCOUNTER — Encounter (HOSPITAL_COMMUNITY)
Admission: RE | Admit: 2022-10-16 | Discharge: 2022-10-16 | Disposition: A | Discharge status: Home / Self Care | Payer: Commercial Managed Care - HMO | Source: Ambulatory Visit | Attending: Pulmonary Disease | Admitting: Pulmonary Disease

## 2022-10-16 VITALS — Wt 239.4 lb

## 2022-10-16 DIAGNOSIS — J849 Interstitial pulmonary disease, unspecified: Secondary | ICD-10-CM

## 2022-10-16 DIAGNOSIS — Z79899 Other long term (current) drug therapy: Secondary | ICD-10-CM

## 2022-10-16 NOTE — Progress Notes (Signed)
Daily Session Note  Patient Details  Name: ANNALEAH WAREING MRN: [id number] Date of Birth: March 01, 1975 Referring Provider:   Doristine Devoid Pulmonary Rehab Walk Test from 08/15/2022 in [facility] for Heart, Vascular, & Lung Health  Referring Provider Soskis  [sees [name]]       Encounter Date: 10/16/2022  Check In:  Session Check In - 10/16/22 1114       Check-In   Supervising physician immediately available to respond to emergencies [facility] MD immediately available    Physician(s) Edd Fabian, NP    [facility]    Staff Present Durel Salts, RT;Samantha Belarus, RD, Dutch Gray, RN, Doris Cheadle, MS, ACSM-CEP, Exercise Physiologist    Virtual Visit No    Medication changes reported     No    Fall or balance concerns reported    No    Tobacco Cessation No Change    Warm-up and Cool-down Performed as group-led instruction    Resistance Training Performed Yes    VAD Patient? No    PAD/SET Patient? No      Pain Assessment   Currently in Pain? No/denies    Multiple Pain Sites No             Capillary Blood Glucose: No results found for this or any previous visit (from the past 24 hour(s)).   Exercise Prescription Changes - 10/16/22 1200       Response to Exercise   Blood Pressure (Admit) 110/64    Blood Pressure (Exercise) 120/64    Blood Pressure (Exit) 110/60    Heart Rate (Admit) 62 bpm    Heart Rate (Exercise) 107 bpm    Heart Rate (Exit) 77 bpm    Oxygen Saturation (Admit) 98 %    Oxygen Saturation (Exercise) 85 %   85% on 8L, 90% on 10L   Oxygen Saturation (Exit) 95 %    Rating of Perceived Exertion (Exercise) 13    Perceived Dyspnea (Exercise) 2    Duration Continue with 30 min of aerobic exercise without signs/symptoms of physical distress.    Intensity THRR unchanged      Progression   Progression Continue to progress workloads to maintain intensity without signs/symptoms of physical distress.       Resistance Training   Training Prescription Yes    Weight blue bands    Reps 10-15    Time 10 Minutes      Oxygen   Oxygen Continuous    Liters 8-10      Treadmill   MPH 2.4    Grade 1.5    Minutes 15    METs 3.33      Recumbant Elliptical   Level 3    Minutes 15    METs 3.1      Oxygen   Maintain Oxygen Saturation 88% or higher             Social History   Tobacco Use  Smoking Status Former   Current packs/day: 0.00   Average packs/day: 2.0 packs/day for 18.0 years (36.1 ttl pk-yrs)   Types: Cigarettes   Start date: 43   Quit date: 03/16/2007   Years since quitting: 15.5   Passive exposure: Current  Smokeless Tobacco Never    Goals Met:  Independence with exercise equipment Exercise tolerated well No report of concerns or symptoms today Strength training completed today  Goals Unmet:  O2 Sat Had to increased O2 from 8L to 10L to keep  oxy sats > 88%  Comments: Service time is from 1016 to 1139    Dr. Mechele Collin is Medical Director for Pulmonary Rehab at [facility].

## 2022-10-18 ENCOUNTER — Encounter (HOSPITAL_COMMUNITY)
Admission: RE | Admit: 2022-10-18 | Discharge: 2022-10-18 | Disposition: A | Discharge status: Home / Self Care | Payer: Commercial Managed Care - HMO | Source: Ambulatory Visit | Attending: Pulmonary Disease | Admitting: Pulmonary Disease

## 2022-10-18 DIAGNOSIS — J849 Interstitial pulmonary disease, unspecified: Secondary | ICD-10-CM | POA: Diagnosis not present

## 2022-10-18 NOTE — Progress Notes (Signed)
Daily Session Note  Patient Details  Name: Hannah Klein MRN: [id number] Date of Birth: [birth year] Referring Provider:   Doristine Devoid Pulmonary Rehab Walk Test from 08/15/2022 in [facility] for Heart, Vascular, & Lung Health  Referring Provider Soskis  [sees [name]]       Encounter Date: 10/18/2022  Check In:  Session Check In - 10/18/22 1050       Check-In   Supervising physician immediately available to respond to emergencies [facility] MD immediately available    Physician(s) Neila Gear, NP    [facility]    Staff Present Durel Salts, RT;Samantha Belarus, RD, Dutch Gray, RN, Doris Cheadle, MS, ACSM-CEP, Exercise Physiologist;Jaylyne Breese [facility], ACSM-CEP, Exercise Physiologist    Virtual Visit No    Medication changes reported     No    Fall or balance concerns reported    No    Tobacco Cessation No Change    Warm-up and Cool-down Performed as group-led instruction    Resistance Training Performed Yes    VAD Patient? No    PAD/SET Patient? No      Pain Assessment   Currently in Pain? No/denies    Multiple Pain Sites No             Capillary Blood Glucose: No results found for this or any previous visit (from the past 24 hour(s)).    Social History   Tobacco Use  Smoking Status Former   Current packs/day: 0.00   Average packs/day: 2.0 packs/day for 18.0 years (36.1 ttl pk-yrs)   Types: Cigarettes   Start date: 52   Quit date: 03/16/2007   Years since quitting: 15.6   Passive exposure: Current  Smokeless Tobacco Never    Goals Met:  Independence with exercise equipment Exercise tolerated well No report of concerns or symptoms today Strength training completed today  Goals Unmet:  Not Applicable  Comments: Service time is from 1008 to 1150.    Dr. Mechele Collin is Medical Director for Pulmonary Rehab at [facility].

## 2022-10-23 ENCOUNTER — Encounter (HOSPITAL_COMMUNITY)
Admission: RE | Admit: 2022-10-23 | Discharge: 2022-10-23 | Disposition: A | Discharge status: Home / Self Care | Payer: Commercial Managed Care - HMO | Source: Ambulatory Visit | Attending: Pulmonary Disease | Admitting: Pulmonary Disease

## 2022-10-23 DIAGNOSIS — J849 Interstitial pulmonary disease, unspecified: Secondary | ICD-10-CM | POA: Diagnosis not present

## 2022-10-23 NOTE — Progress Notes (Signed)
Daily Session Note  Patient Details  Name: Hannah Klein MRN: [id number] Date of Birth: 18-Nov-1975 Referring Provider:   Doristine Devoid Pulmonary Rehab Walk Test from 08/15/2022 in [facility] for Heart, Vascular, & Lung Health  Referring Provider Soskis  [sees [name]]       Encounter Date: 10/23/2022  Check In:  Session Check In - 10/23/22 1216       Check-In   Supervising physician immediately available to respond to emergencies [facility] MD immediately available    Physician(s) Carlyon Shadow, NP    [facility]    Staff Present Samantha Belarus, RD, Dutch Gray, RN, BSN;Randi Reeve BS, ACSM-CEP, Exercise Physiologist;Kaylee Earlene Plater, MS, ACSM-CEP, Exercise Physiologist;Lissette Schenk Katrinka Blazing, RT    Virtual Visit No    Medication changes reported     No    Fall or balance concerns reported    No    Tobacco Cessation No Change    Warm-up and Cool-down Performed as group-led instruction    Resistance Training Performed Yes    VAD Patient? No    PAD/SET Patient? No      Pain Assessment   Currently in Pain? No/denies    Multiple Pain Sites No             Capillary Blood Glucose: No results found for this or any previous visit (from the past 24 hour(s)).    Social History   Tobacco Use  Smoking Status Former   Current packs/day: 0.00   Average packs/day: 2.0 packs/day for 18.0 years (36.1 ttl pk-yrs)   Types: Cigarettes   Start date: 27   Quit date: 03/16/2007   Years since quitting: 15.6   Passive exposure: Current  Smokeless Tobacco Never    Goals Met:  Proper associated with RPD/PD & O2 Sat Independence with exercise equipment Exercise tolerated well No report of concerns or symptoms today Strength training completed today  Goals Unmet:  Not Applicable  Comments: Service time is from 1013 to 1144.    Dr. Mechele Collin is Medical Director for Pulmonary Rehab at [facility].

## 2022-10-24 DIAGNOSIS — J849 Interstitial pulmonary disease, unspecified: Secondary | ICD-10-CM

## 2022-10-25 ENCOUNTER — Encounter (HOSPITAL_COMMUNITY)
Admission: RE | Admit: 2022-10-25 | Discharge: 2022-10-25 | Disposition: A | Discharge status: Home / Self Care | Payer: Commercial Managed Care - HMO | Source: Ambulatory Visit | Attending: Pulmonary Disease | Admitting: Pulmonary Disease

## 2022-10-25 ENCOUNTER — Other Ambulatory Visit (INDEPENDENT_AMBULATORY_CARE_PROVIDER_SITE_OTHER): Payer: Commercial Managed Care - HMO

## 2022-10-25 ENCOUNTER — Ambulatory Visit: Payer: Commercial Managed Care - HMO

## 2022-10-25 DIAGNOSIS — J849 Interstitial pulmonary disease, unspecified: Secondary | ICD-10-CM

## 2022-10-25 LAB — CBC WITH DIFFERENTIAL/PLATELET
Basophils Absolute: 0.1 10*3/uL (ref 0.0–0.1)
Basophils Relative: 1.1 % (ref 0.0–3.0)
Eosinophils Absolute: 0.1 10*3/uL (ref 0.0–0.7)
Eosinophils Relative: 2.3 % (ref 0.0–5.0)
HCT: 36.8 % (ref 36.0–46.0)
Hemoglobin: 11.7 g/dL — ABNORMAL LOW (ref 12.0–15.0)
Lymphocytes Relative: 20.6 % (ref 12.0–46.0)
Lymphs Abs: 1.1 10*3/uL (ref 0.7–4.0)
MCHC: 31.6 g/dL (ref 30.0–36.0)
MCV: 97.6 fl (ref 78.0–100.0)
Monocytes Absolute: 0.5 10*3/uL (ref 0.1–1.0)
Monocytes Relative: 9.7 % (ref 3.0–12.0)
Neutro Abs: 3.5 10*3/uL (ref 1.4–7.7)
Neutrophils Relative %: 66.3 % (ref 43.0–77.0)
Platelets: 164 10*3/uL (ref 150.0–400.0)
RBC: 3.78 Mil/uL — ABNORMAL LOW (ref 3.87–5.11)
RDW: 14.8 % (ref 11.5–15.5)
WBC: 5.3 10*3/uL (ref 4.0–10.5)

## 2022-10-25 LAB — COMPREHENSIVE METABOLIC PANEL
ALT: 19 U/L (ref 0–35)
AST: 24 U/L (ref 0–37)
Albumin: 4.7 g/dL (ref 3.5–5.2)
Alkaline Phosphatase: 38 U/L — ABNORMAL LOW (ref 39–117)
BUN: 17 mg/dL (ref 6–23)
CO2: 28 mEq/L (ref 19–32)
Calcium: 9.7 mg/dL (ref 8.4–10.5)
Chloride: 103 mEq/L (ref 96–112)
Creatinine, Ser: 0.94 mg/dL (ref 0.40–1.20)
GFR: 71.87 mL/min (ref >60.00)
Glucose, Bld: 84 mg/dL (ref 70–99)
Potassium: 3.6 mEq/L (ref 3.5–5.1)
Sodium: 139 mEq/L (ref 135–145)
Total Bilirubin: 0.9 mg/dL (ref 0.2–1.2)
Total Protein: 7.3 g/dL (ref 6.0–8.3)

## 2022-10-25 LAB — URINALYSIS, ROUTINE W REFLEX MICROSCOPIC
Bilirubin Urine: NEGATIVE
Hgb urine dipstick: NEGATIVE
Ketones, ur: NEGATIVE
Leukocytes,Ua: NEGATIVE
Nitrite: NEGATIVE
RBC / HPF: NONE SEEN (ref 0–?)
Specific Gravity, Urine: 1.025 (ref 1.000–1.030)
Total Protein, Urine: NEGATIVE
Urine Glucose: NEGATIVE
Urobilinogen, UA: 0.2 (ref 0.0–1.0)
pH: 6 (ref 5.0–8.0)

## 2022-10-25 MED ORDER — SODIUM CHLORIDE 0.9 % IV SOLN
400.0000 mg/m2 | Freq: Once | INTRAVENOUS | Status: AC
  Administered 2022-10-26: 900 mg via INTRAVENOUS
  Filled 2022-10-25 (×2): qty 9

## 2022-10-25 MED ORDER — ALBUTEROL SULFATE HFA 108 (90 BASE) MCG/ACT IN AERS
INHALATION_SPRAY | RESPIRATORY_TRACT | 0 refills | Status: DC
Start: 2022-10-25 — End: 2023-09-12

## 2022-10-25 MED ORDER — DEXTROSE-SODIUM CHLORIDE 5-0.45 % IV SOLN: INTRAVENOUS | Status: DC

## 2022-10-25 MED ORDER — SODIUM CHLORIDE 0.9 % IV SOLN
1680.0000 mg | Freq: Once | INTRAVENOUS | Status: AC
  Administered 2022-10-26: 1680 mg via INTRAVENOUS
  Filled 2022-10-25 (×2): qty 84

## 2022-10-25 MED ORDER — ONDANSETRON HCL 8 MG PO TABS: 8.0000 mg | ORAL_TABLET | Freq: Once | ORAL | Status: DC

## 2022-10-25 MED ORDER — HYDROCORTISONE SOD SUC (PF) 100 MG IJ SOLR
100.0000 mg | Freq: Once | INTRAMUSCULAR | Status: AC
  Administered 2022-10-26: 100 mg via INTRAVENOUS
  Filled 2022-10-25: qty 2

## 2022-10-25 NOTE — Progress Notes (Signed)
Daily Session Note  Patient Details  Name: Hannah Klein MRN: [id number] Date of Birth: [birth year] Referring Provider:   Doristine Devoid Pulmonary Rehab Walk Test from 08/15/2022 in [facility]  Referring Provider Soskis  [sees [name]]       Encounter Date: 10/25/2022  Check In:  Session Check In - 10/25/22 1210       Check-In   Supervising physician immediately available to respond to emergencies [facility] MD immediately available    Physician(s) Bernadene Person, NP    Location [facility]    Staff Present Elissa Lovett BS, ACSM-CEP, Exercise Physiologist;Kaylee Earlene Plater, MS, ACSM-CEP, Exercise Physiologist;Casey Thedore Mins, RN, BSN    Virtual Visit No    Medication changes reported     No    Fall or balance concerns reported    No    Tobacco Cessation No Change    Warm-up and Cool-down Performed as group-led instruction    Resistance Training Performed Yes    VAD Patient? No    PAD/SET Patient? No      Pain Assessment   Currently in Pain? No/denies             Capillary Blood Glucose: No results found for this or any previous visit (from the past 24 hour(s)).    Social History   Tobacco Use  Smoking Status Former   Current packs/day: 0.00   Average packs/day: 2.0 packs/day for 18.0 years (36.1 ttl pk-yrs)   Types: Cigarettes   Start date: 21   Quit date: 03/16/2007   Years since quitting: 15.6   Passive exposure: Current  Smokeless Tobacco Never    Goals Met:  Independence with exercise equipment Exercise tolerated well No report of concerns or symptoms today Strength training completed today  Goals Unmet:  Not Applicable  Comments: Service time is from 1015 to 1153.    Dr. Mechele Collin is Medical Director for Pulmonary Rehab at [facility].

## 2022-10-25 NOTE — Progress Notes (Signed)
Labs resulted and provider notified. Per Dr. Isaiah Serge, labs reviewed and ok to proceed with cytoxan infusion tomorrow, 10/26/22.  Wyvonne Lenz, RN

## 2022-10-25 NOTE — Progress Notes (Signed)
Pt arrived for labs prior to infusion appointment on tomorrow, 10/26/22. Labs being collected by lab tech.

## 2022-10-26 ENCOUNTER — Ambulatory Visit (INDEPENDENT_AMBULATORY_CARE_PROVIDER_SITE_OTHER): Payer: Commercial Managed Care - HMO

## 2022-10-26 VITALS — BP 136/82 | HR 62 | Temp 98.0°F | Resp 22 | Ht 69.0 in | Wt 234.6 lb

## 2022-10-26 DIAGNOSIS — M3502 Sicca syndrome with lung involvement: Secondary | ICD-10-CM

## 2022-10-26 DIAGNOSIS — J849 Interstitial pulmonary disease, unspecified: Secondary | ICD-10-CM

## 2022-10-26 MED ORDER — ONDANSETRON 4 MG PO TBDP
8.0000 mg | ORAL_TABLET | Freq: Once | ORAL | Status: AC
  Administered 2022-10-26: 8 mg via ORAL
  Filled 2022-10-26: qty 2

## 2022-10-26 NOTE — Progress Notes (Signed)
Diagnosis: Interstitial lung disease  Provider:  Chilton Greathouse MD  Procedure: IV Infusion  IV Type: Peripheral, IV Location: L Forearm  D5 1/2 NS 400 mL IV: 0921 - 1124   Solu-cortef 100 mg IV: 1051   Zofran ODT 8 mg: 1125   [place] 900 mg IV: 1132 - 1200   Cytoxan 1680 mg IV: 1610-9604 (administered by Aura Fey., RN)   [place] 900 mg IV: 1310 - 1341   D5 1/2 NS 500 mL IV: 1346 - 1446   Post Infusion IV Care: Patient declined observation and Peripheral IV Discontinued  Discharge: Condition: Stable, Destination: Home . AVS Declined  Performed by:  Wyvonne Lenz, RN

## 2022-10-30 ENCOUNTER — Encounter (HOSPITAL_COMMUNITY)
Admission: RE | Admit: 2022-10-30 | Discharge: 2022-10-30 | Disposition: A | Discharge status: Home / Self Care | Payer: Commercial Managed Care - HMO | Source: Ambulatory Visit | Attending: Pulmonary Disease | Admitting: Pulmonary Disease

## 2022-10-30 VITALS — Wt 235.7 lb

## 2022-10-30 DIAGNOSIS — J849 Interstitial pulmonary disease, unspecified: Secondary | ICD-10-CM | POA: Diagnosis present

## 2022-10-30 DIAGNOSIS — Z87891 Personal history of nicotine dependence: Secondary | ICD-10-CM | POA: Diagnosis not present

## 2022-10-30 NOTE — Progress Notes (Signed)
Daily Session Note  Patient Details  Name: Hannah Klein MRN: [id number] Date of Birth: [birth year] Referring Provider:   Doristine Devoid Pulmonary Rehab Walk Test from 08/15/2022 in [facility]  Referring Provider Soskis  [sees [name]]       Encounter Date: 10/30/2022  Check In:  Session Check In - 10/30/22 1132       Check-In   Supervising physician immediately available to respond to emergencies [facility] MD immediately available    Physician(s) Bernadene Person, NP    Location [facility]    Staff Present Elissa Lovett BS, ACSM-CEP, Exercise Physiologist;Kaylee Earlene Plater, MS, ACSM-CEP, Exercise Physiologist;Dona Klemann Hermine Messick Belarus, RD, [place] Gray, RN, BSN    Virtual Visit No    Medication changes reported     No    Fall or balance concerns reported    No    Tobacco Cessation No Change    Warm-up and Cool-down Performed as group-led instruction    Resistance Training Performed Yes    VAD Patient? No    PAD/SET Patient? No      Pain Assessment   Currently in Pain? No/denies    Multiple Pain Sites No             Capillary Blood Glucose: No results found for this or any previous visit (from the past 24 hour(s)).   Exercise Prescription Changes - 10/30/22 1100       Response to Exercise   Blood Pressure (Admit) 118/60    Blood Pressure (Exercise) 128/62    Blood Pressure (Exit) 100/68    Heart Rate (Admit) 69 bpm    Heart Rate (Exercise) 84 bpm    Heart Rate (Exit) 72 bpm    Oxygen Saturation (Admit) 99 %    Oxygen Saturation (Exercise) 99 %    Oxygen Saturation (Exit) 97 %    Rating of Perceived Exertion (Exercise) 11    Perceived Dyspnea (Exercise) 1    Duration Continue with 30 min of aerobic exercise without signs/symptoms of physical distress.    Intensity THRR unchanged      Progression   Progression Continue to progress workloads to maintain intensity without signs/symptoms of  physical distress.      Resistance Training   Training Prescription Yes    Weight blue bands    Reps 10-15    Time 10 Minutes      Oxygen   Oxygen Continuous    Liters 8-10      Treadmill   MPH 2.5    Grade 1.5    Minutes 15    METs 3.43      Recumbant Elliptical   Level 4    Minutes 15    METs 2.9      Oxygen   Maintain Oxygen Saturation 88% or higher             Social History   Tobacco Use  Smoking Status Former   Current packs/day: 0.00   Average packs/day: 2.0 packs/day for 18.0 years (36.1 ttl pk-yrs)   Types: Cigarettes   Start date: 71   Quit date: 03/16/2007   Years since quitting: 15.6   Passive exposure: Current  Smokeless Tobacco Never    Goals Met:  Proper associated with RPD/PD & O2 Sat Independence with exercise equipment Exercise tolerated well No report of concerns or symptoms today Strength training completed today  Goals Unmet:  Not Applicable  Comments: Service time is  from 1017 to 1142.    Dr. Mechele Collin is Medical Director for Pulmonary Rehab at [facility].

## 2022-10-31 NOTE — Progress Notes (Signed)
Pulmonary Individual Treatment Plan  Patient Details  Name: Hannah Klein MRN: [id number] Date of Birth: 01-10-1976 Referring Provider:   Doristine Devoid Pulmonary Rehab Walk Test from 08/15/2022 in [facility]  Referring Provider Soskis  [sees [name]]       Initial Encounter Date:  Flowsheet Row Pulmonary Rehab Walk Test from 08/15/2022 in [facility]  Date 08/15/22       Visit Diagnosis: Interstitial lung disease (HCC)  Patient's Home Medications on Admission:   Current Outpatient Medications:    acetaminophen (TYLENOL) 500 MG tablet, Take 2 tablets (1,000 mg total) by mouth every 6 (six) hours as needed., Disp: 30 tablet, Rfl: 0   albuterol (VENTOLIN HFA) 108 (90 Base) MCG/ACT inhaler, INHALE 1-2 PUFFS BY MOUTH EVERY 6 HOURS AS NEEDED FOR WHEEZE OR SHORTNESS OF BREATH, Disp: 18 g, Rfl: 0   Azelastine HCl 137 MCG/SPRAY SOLN, PLACE 2 SPRAYS INTO BOTH NOSTRILS 2 (TWO) TIMES DAILY. USE IN EACH NOSTRIL AS DIRECTED, Disp: 30 mL, Rfl: 5   B Complex-C (B-COMPLEX WITH VITAMIN C) tablet, Take 1 tablet by mouth daily., Disp: , Rfl:    budesonide (PULMICORT) 0.25 MG/2ML nebulizer solution, Take 0.25 mg by nebulization 2 (two) times daily. (Patient not taking: Reported on 09/12/2022), Disp: , Rfl:    Cholecalciferol (VITAMIN D3) 125 MCG (5000 UT) TABS, Take 5,000 Units by mouth daily., Disp: , Rfl:    clobetasol ointment (TEMOVATE) 0.05 %, Apply 1 Application topically 2 (two) times daily., Disp: 30 g, Rfl: 0   dapsone 25 MG tablet, TAKE 2 TABLETS BY MOUTH 2 TIMES DAILY., Disp: 360 tablet, Rfl: 1   diclofenac Sodium (VOLTAREN ARTHRITIS PAIN) 1 % GEL, Apply 2 g topically daily as needed (Chest pain)., Disp: , Rfl:    esomeprazole (NEXIUM) 40 MG capsule, TAKE 1 CAPSULE (40 MG TOTAL) BY MOUTH 2 (TWO) TIMES DAILY. FOR HEARTBURN., Disp: 180 capsule, Rfl: 2   famotidine (PEPCID) 40  MG tablet, Take 1 tablet (40 mg total) by mouth 2 (two) times daily. for heartburn., Disp: 180 tablet, Rfl: 2   formoterol (PERFOROMIST) 20 MCG/2ML nebulizer solution, Take 2 mLs (20 mcg total) by nebulization 2 (two) times daily. (Patient taking differently: Take 20 mcg by nebulization daily as needed (Shortness of breath).), Disp: 120 mL, Rfl: 1   formoterol (PERFOROMIST) 20 MCG/2ML nebulizer solution, Take 20 mcg by nebulization 2 (two) times daily. (Patient not taking: Reported on 09/12/2022), Disp: , Rfl:    hydrOXYzine (VISTARIL) 50 MG capsule, TAKE 1 CAPSULE BY MOUTH ONCE OR TWICE DAILY AS NEEDED FOR ANXIETY, Disp: 180 capsule, Rfl: 2   levocetirizine (XYZAL) 5 MG tablet, Take 1 tablet (5 mg total) by mouth 2 (two) times daily. For allergies, Disp: 180 tablet, Rfl: 3   mometasone-formoterol (DULERA) 200-5 MCG/ACT AERO, Inhale 2 puffs into the lungs every 12 (twelve) hours. (Patient not taking: Reported on 09/12/2022), Disp: 13 g, Rfl: 5   Multiple Vitamin (MULTIVITAMIN) capsule, Take 1 capsule by mouth daily., Disp: , Rfl:    mycophenolate (CELLCEPT) 500 MG tablet, Take 3 tablets (1,500 mg total) by mouth 2 (two) times daily., Disp: 120 tablet, Rfl: 3   Omega-3 Fatty Acids (OMEGA-3 FISH OIL) 300 MG CAPS, Take 1,000 mg by mouth., Disp: , Rfl:    ondansetron (ZOFRAN) 4 MG tablet, Take 1 tablet (4 mg total) by mouth every 8 (eight) hours as needed for nausea  or vomiting., Disp: 20 tablet, Rfl: 0   oxyCODONE-acetaminophen (PERCOCET) 7.5-325 MG tablet, Take 1 tablet by mouth every 6 (six) hours as needed for up to 20 doses for severe pain., Disp: 20 tablet, Rfl: 0   predniSONE (DELTASONE) 10 MG tablet, TAKE 2 TABLETS BY MOUTH TWICE DAILY, Disp: 120 tablet, Rfl: 1   predniSONE (DELTASONE) 5 MG tablet, Take 1 tablet (5 mg total) by mouth daily with breakfast., Disp: 90 tablet, Rfl: 1   pregabalin (LYRICA) 25 MG capsule, Take 25 mg by mouth 2 (two) times daily., Disp: , Rfl:    rosuvastatin (CRESTOR) 10  MG tablet, TAKE 1 TABLET BY MOUTH EVERY DAY FOR CHOLESTEROL, Disp: 90 tablet, Rfl: 2   sucralfate (CARAFATE) 1 g tablet, Take 1 tablet (1 g total) by mouth in the morning, at noon, and at bedtime., Disp: 90 tablet, Rfl: 5   vitamin B-12 (CYANOCOBALAMIN) 1000 MCG tablet, Take 1,000 mcg by mouth daily., Disp: , Rfl:    vitamin C (ASCORBIC ACID) 500 MG tablet, Take 500 mg by mouth daily., Disp: , Rfl:    vitamin E 180 MG (400 UNITS) capsule, Take 400 Units by mouth daily., Disp: , Rfl:   Past Medical History: Past Medical History:  Diagnosis Date   Arthritis    Asthma    Environmental allergies    Esophageal dysphagia    Generalized anxiety disorder    GERD (gastroesophageal reflux disease)    History of hiatal hernia    Hx of colonic polyps    Hypertension    Lung disorder    Patient states she has two unidentified ILD   Migraines    MRSA (methicillin resistant Staphylococcus aureus)    Lt upper arm  approx 8 years ago   Sleep apnea     Tobacco Use: Social History   Tobacco Use  Smoking Status Former   Current packs/day: 0.00   Average packs/day: 2.0 packs/day for 18.0 years (36.1 ttl pk-yrs)   Types: Cigarettes   Start date: 52   Quit date: 03/16/2007   Years since quitting: 15.6   Passive exposure: Current  Smokeless Tobacco Never    Labs: Review Flowsheet  More data exists      Latest Ref Rng & Units 04/07/2019 07/07/2019 03/17/2020 06/09/2021 05/31/2022  Labs for ITP Cardiac and Pulmonary Rehab  Cholestrol 0 - 200 mg/dL 161  096  045  409  811   LDL (calc) 0 - 99 mg/dL 78  914  94  - -  Direct LDL mg/dL - - - 782.9  562.1   HDL-C >39.00 mg/dL 30.86  57.84  69.62  95.28  66.80   Trlycerides 0.0 - 149.0 mg/dL 413.2  440.1  027.2  536.6  252.0   Hemoglobin A1c 4.6 - 6.5 % - - 5.5  5.6  5.5     Details            Capillary Blood Glucose: No results found for: "GLUCAP"   Pulmonary Assessment Scores:  Pulmonary Assessment Scores     Row Name 08/15/22 0925          ADL UCSD   ADL Phase Entry     SOB Score total 65       CAT Score   CAT Score 19       mMRC Score   mMRC Score 4             UCSD: Self-administered rating of dyspnea associated with activities of daily living (  ADLs) 6-point scale (0 = "not at all" to 5 = "maximal or unable to do because of breathlessness")  Scoring Scores range from 0 to 120.  Minimally important difference is 5 units  CAT: CAT can identify the health impairment of COPD patients and is better correlated with disease progression.  CAT has a scoring range of zero to 40. The CAT score is classified into four groups of low (less than 10), medium (10 - 20), high (21-30) and very high (31-40) based on the impact level of disease on health status. A CAT score over 10 suggests significant symptoms.  A worsening CAT score could be explained by an exacerbation, poor medication adherence, poor inhaler technique, or progression of COPD or comorbid conditions.  CAT MCID is 2 points  mMRC: mMRC (Modified Medical Research Council) Dyspnea Scale is used to assess the degree of baseline functional disability in patients of respiratory disease due to dyspnea. No minimal important difference is established. A decrease in score of 1 point or greater is considered a positive change.   Pulmonary Function Assessment:  Pulmonary Function Assessment - 08/15/22 0924       Breath   Shortness of Breath Yes;Limiting activity             Exercise Target Goals: Exercise Program Goal: Individual exercise prescription set using results from initial 6 min walk test and THRR while considering  patient's activity barriers and safety.   Exercise Prescription Goal: Initial exercise prescription builds to 30-45 minutes a day of aerobic activity, 2-3 days per week.  Home exercise guidelines will be given to patient during program as part of exercise prescription that the participant will acknowledge.  Activity Barriers & Risk  Stratification:  Activity Barriers & Cardiac Risk Stratification - 08/15/22 0927       Activity Barriers & Cardiac Risk Stratification   Activity Barriers Deconditioning;Muscular Weakness;Shortness of Breath;Arthritis;Neck/Spine Problems;Joint Problems    Comments Rt shoulder bursitis and hip pain             6 Minute Walk:  6 Minute Walk     Row Name 08/15/22 1030         6 Minute Walk   Phase Initial     Distance 1170 feet     Walk Time 6 minutes     # of Rest Breaks 2  2:29-2:40, 4:56-5:10     MPH 2.22     METS 3.55     RPE 11     Perceived Dyspnea  2     VO2 Peak 12.44     Symptoms No     Resting HR 87 bpm     Resting BP 104/62     Resting Oxygen Saturation  96 %     Exercise Oxygen Saturation  during 6 min walk 85 %     Max Ex. HR 107 bpm     Max Ex. BP 134/70     2 Minute Post BP 118/64       Interval HR   1 Minute HR 104     2 Minute HR 107     3 Minute HR 102     4 Minute HR 98     5 Minute HR 97     6 Minute HR 99     2 Minute Post HR 79     Interval Heart Rate? Yes       Interval Oxygen   Interval Oxygen? Yes     Baseline Oxygen  Saturation % 96 %     1 Minute Oxygen Saturation % 94 %     1 Minute Liters of Oxygen 8 L     2 Minute Oxygen Saturation % 90 %     2 Minute Liters of Oxygen 8 L     3 Minute Oxygen Saturation % 91 %  2:29 85%     3 Minute Liters of Oxygen 8 L  increased to 10L     4 Minute Oxygen Saturation % 95 %     4 Minute Liters of Oxygen 10 L     5 Minute Oxygen Saturation % 98 %     5 Minute Liters of Oxygen 10 L     6 Minute Oxygen Saturation % 94 %     6 Minute Liters of Oxygen 10 L     2 Minute Post Oxygen Saturation % 99 %     2 Minute Post Liters of Oxygen 8 L              Oxygen Initial Assessment:  Oxygen Initial Assessment - 08/15/22 0923       Home Oxygen   Home Oxygen Device E-Tanks;Home Concentrator    Sleep Oxygen Prescription Continuous    Liters per minute 4    Home Exercise Oxygen Prescription  Continuous    Liters per minute 8    Home Resting Oxygen Prescription Continuous    Liters per minute 2    Compliance with Home Oxygen Use Yes      Initial 6 min Walk   Oxygen Used Continuous    Liters per minute 8      Program Oxygen Prescription   Program Oxygen Prescription Continuous    Liters per minute 8      Intervention   Short Term Goals To learn and exhibit compliance with exercise, home and travel O2 prescription;To learn and understand importance of maintaining oxygen saturations>88%;To learn and demonstrate proper use of respiratory medications;To learn and understand importance of monitoring SPO2 with pulse oximeter and demonstrate accurate use of the pulse oximeter.;To learn and demonstrate proper pursed lip breathing techniques or other breathing techniques.     Long  Term Goals Exhibits compliance with exercise, home  and travel O2 prescription;Verbalizes importance of monitoring SPO2 with pulse oximeter and return demonstration;Maintenance of O2 saturations>88%;Exhibits proper breathing techniques, such as pursed lip breathing or other method taught during program session;Compliance with respiratory medication;Demonstrates proper use of MDI's             Oxygen Re-Evaluation:  Oxygen Re-Evaluation     Row Name 08/31/22 1015 09/21/22 1351 10/25/22 1620         Program Oxygen Prescription   Program Oxygen Prescription Continuous Continuous Continuous     Liters per minute 8 10 10      Comments -- 94% on 10L 94% on 10L       Home Oxygen   Home Oxygen Device E-Tanks;Home Concentrator E-Tanks;Home Concentrator E-Tanks;Home Concentrator     Sleep Oxygen Prescription Continuous Continuous Continuous     Liters per minute 4 4 4      Home Exercise Oxygen Prescription Continuous Continuous Continuous     Liters per minute 8 8 8      Home Resting Oxygen Prescription Continuous Continuous Continuous     Liters per minute 2 2 2      Compliance with Home Oxygen Use Yes Yes  Yes       Goals/Expected Outcomes   Short Term Goals  To learn and exhibit compliance with exercise, home and travel O2 prescription;To learn and understand importance of maintaining oxygen saturations>88%;To learn and demonstrate proper use of respiratory medications;To learn and understand importance of monitoring SPO2 with pulse oximeter and demonstrate accurate use of the pulse oximeter.;To learn and demonstrate proper pursed lip breathing techniques or other breathing techniques.  To learn and exhibit compliance with exercise, home and travel O2 prescription;To learn and understand importance of maintaining oxygen saturations>88%;To learn and demonstrate proper use of respiratory medications;To learn and understand importance of monitoring SPO2 with pulse oximeter and demonstrate accurate use of the pulse oximeter.;To learn and demonstrate proper pursed lip breathing techniques or other breathing techniques.  To learn and exhibit compliance with exercise, home and travel O2 prescription;To learn and understand importance of maintaining oxygen saturations>88%;To learn and demonstrate proper use of respiratory medications;To learn and understand importance of monitoring SPO2 with pulse oximeter and demonstrate accurate use of the pulse oximeter.;To learn and demonstrate proper pursed lip breathing techniques or other breathing techniques.      Long  Term Goals Exhibits compliance with exercise, home  and travel O2 prescription;Verbalizes importance of monitoring SPO2 with pulse oximeter and return demonstration;Maintenance of O2 saturations>88%;Exhibits proper breathing techniques, such as pursed lip breathing or other method taught during program session;Compliance with respiratory medication;Demonstrates proper use of MDI's Exhibits compliance with exercise, home  and travel O2 prescription;Verbalizes importance of monitoring SPO2 with pulse oximeter and return demonstration;Maintenance of O2  saturations>88%;Exhibits proper breathing techniques, such as pursed lip breathing or other method taught during program session;Compliance with respiratory medication;Demonstrates proper use of MDI's Exhibits compliance with exercise, home  and travel O2 prescription;Verbalizes importance of monitoring SPO2 with pulse oximeter and return demonstration;Maintenance of O2 saturations>88%;Exhibits proper breathing techniques, such as pursed lip breathing or other method taught during program session;Compliance with respiratory medication;Demonstrates proper use of MDI's     Goals/Expected Outcomes Compliance and understanding of oxygen saturation monitoring and breathing techniques to decrease shortness of breath. Compliance and understanding of oxygen saturation monitoring and breathing techniques to decrease shortness of breath. Compliance and understanding of oxygen saturation monitoring and breathing techniques to decrease shortness of breath.              Oxygen Discharge (Final Oxygen Re-Evaluation):  Oxygen Re-Evaluation - 10/25/22 1620       Program Oxygen Prescription   Program Oxygen Prescription Continuous    Liters per minute 10    Comments 94% on 10L      Home Oxygen   Home Oxygen Device E-Tanks;Home Concentrator    Sleep Oxygen Prescription Continuous    Liters per minute 4    Home Exercise Oxygen Prescription Continuous    Liters per minute 8    Home Resting Oxygen Prescription Continuous    Liters per minute 2    Compliance with Home Oxygen Use Yes      Goals/Expected Outcomes   Short Term Goals To learn and exhibit compliance with exercise, home and travel O2 prescription;To learn and understand importance of maintaining oxygen saturations>88%;To learn and demonstrate proper use of respiratory medications;To learn and understand importance of monitoring SPO2 with pulse oximeter and demonstrate accurate use of the pulse oximeter.;To learn and demonstrate proper pursed lip  breathing techniques or other breathing techniques.     Long  Term Goals Exhibits compliance with exercise, home  and travel O2 prescription;Verbalizes importance of monitoring SPO2 with pulse oximeter and return demonstration;Maintenance of O2 saturations>88%;Exhibits proper breathing techniques, such as pursed  lip breathing or other method taught during program session;Compliance with respiratory medication;Demonstrates proper use of MDI's    Goals/Expected Outcomes Compliance and understanding of oxygen saturation monitoring and breathing techniques to decrease shortness of breath.             Initial Exercise Prescription:  Initial Exercise Prescription - 08/15/22 1000       Date of Initial Exercise RX and Referring Provider   Date 08/15/22    Referring Provider Soskis   sees [name]   Expected Discharge Date 11/08/22      Oxygen   Oxygen Continuous    Liters 8      Treadmill   MPH 2    Grade 0    Minutes 15    METs 2.53      Recumbant Elliptical   Level 1    RPM 30    Watts 70    Minutes 15    METs 2.3      Prescription Details   Frequency (times per week) 2    Duration Progress to 30 minutes of continuous aerobic without signs/symptoms of physical distress      Intensity   THRR 40-80% of Max Heartrate 69-138    Ratings of Perceived Exertion 11-13    Perceived Dyspnea 0-4      Progression   Progression Continue to progress workloads to maintain intensity without signs/symptoms of physical distress.      Resistance Training   Training Prescription Yes    Weight blue bands    Reps 10-15             Perform Capillary Blood Glucose checks as needed.  Exercise Prescription Changes:   Exercise Prescription Changes     Row Name 08/21/22 1200 09/04/22 1200 09/18/22 1200 10/02/22 1200 10/16/22 1200     Response to Exercise   Blood Pressure (Admit) 114/66 122/70 124/62 118/62 110/64   Blood Pressure (Exercise) 140/66 146/60 150/60 134/62 120/64   Blood  Pressure (Exit) 102/64 108/66 118/60 112/68 110/60   Heart Rate (Admit) 75 bpm 75 bpm 73 bpm 71 bpm 62 bpm   Heart Rate (Exercise) 110 bpm 105 bpm 100 bpm 110 bpm 107 bpm   Heart Rate (Exit) 80 bpm 81 bpm 76 bpm 83 bpm 77 bpm   Oxygen Saturation (Admit) 98 % 96 % 97 % 96 % 98 %   Oxygen Saturation (Exercise) 92 % 92 % 94 % 89 % 85 %  85% on 8L, 90% on 10L   Oxygen Saturation (Exit) 97 % 97 % 97 % 96 % 95 %   Rating of Perceived Exertion (Exercise) 15 13 14 13 13    Perceived Dyspnea (Exercise) 2 2 2 2 2    Symptoms DOE -- DOE -- --   Duration Progress to 30 minutes of  aerobic without signs/symptoms of physical distress Progress to 30 minutes of  aerobic without signs/symptoms of physical distress Continue with 30 min of aerobic exercise without signs/symptoms of physical distress. Continue with 30 min of aerobic exercise without signs/symptoms of physical distress. Continue with 30 min of aerobic exercise without signs/symptoms of physical distress.   Intensity THRR unchanged THRR unchanged THRR unchanged THRR unchanged THRR unchanged     Progression   Progression Continue to progress workloads to maintain intensity without signs/symptoms of physical distress. Continue to progress workloads to maintain intensity without signs/symptoms of physical distress. Continue to progress workloads to maintain intensity without signs/symptoms of physical distress. Continue to progress workloads to maintain  intensity without signs/symptoms of physical distress. Continue to progress workloads to maintain intensity without signs/symptoms of physical distress.   Average METs 2.3 2.5 -- -- --     Resistance Training   Training Prescription Yes Yes Yes Yes Yes   Weight blue bands blue bands blue bands blue bands blue bands   Reps 10-15 10-15 10-15 10-15 10-15   Time -- 10 Minutes 10 Minutes 10 Minutes 10 Minutes     Interval Training   Interval Training -- No -- -- --     Oxygen   Oxygen Continuous  Continuous Continuous Continuous Continuous   Liters 10 10 10  8-10 8-10     Treadmill   MPH 1.8 2 2 2  2.4   Grade 0 0 1 1 1.5   Minutes 15 15 15 15 15    METs 2.38 2.53 2.81 2.81 3.33     Recumbant Elliptical   Level 2 2 3 3 3    RPM 40 -- 40 -- --   Minutes 15 15 15 15 15    METs 2.1 2.5 2.6 2.3 3.1     Oxygen   Maintain Oxygen Saturation 88% or higher 88% or higher 88% or higher 88% or higher 88% or higher    Row Name 10/30/22 1100             Response to Exercise   Blood Pressure (Admit) 118/60       Blood Pressure (Exercise) 128/62       Blood Pressure (Exit) 100/68       Heart Rate (Admit) 69 bpm       Heart Rate (Exercise) 84 bpm       Heart Rate (Exit) 72 bpm       Oxygen Saturation (Admit) 99 %       Oxygen Saturation (Exercise) 99 %       Oxygen Saturation (Exit) 97 %       Rating of Perceived Exertion (Exercise) 11       Perceived Dyspnea (Exercise) 1       Duration Continue with 30 min of aerobic exercise without signs/symptoms of physical distress.       Intensity THRR unchanged         Progression   Progression Continue to progress workloads to maintain intensity without signs/symptoms of physical distress.         Resistance Training   Training Prescription Yes       Weight blue bands       Reps 10-15       Time 10 Minutes         Oxygen   Oxygen Continuous       Liters 8-10         Treadmill   MPH 2.5       Grade 1.5       Minutes 15       METs 3.43         Recumbant Elliptical   Level 4       Minutes 15       METs 2.9         Oxygen   Maintain Oxygen Saturation 88% or higher                Exercise Comments:   Exercise Comments     Row Name 08/21/22 1155 10/02/22 1539         Exercise Comments Pt completed first day of group exercise. She exercised on the recumbent elliptical  for 15 min, level2, METs 2.1. She then walked on the TM for 15 min, speed 1.8 for 12 min, 1.2 mph for 3 min. Tolerated fair. She tolerated warm up and  cool down well, performed squats. Discussed METs. Pt is generally overwhelmed by her diagnosis but is managing. Completed home ExRx. Neysa Bonito is currently walking at home. She walks 1 non-rehab day/wk for 10-15 min/day. We discussed Lung Transplant guidelines and building to meeting those guidelines. I mentioned walking 3 non-rehab days/wk for at least 20 min/day. She agreed with my recommendations. I discussed increasing her walking time first and then frequency. I recommended doing a slow progression to meeting transplant guidelines. Neysa Bonito agreed to my progression plan. We also discussed exercise strategies as Neysa Bonito is walking outside. She is willing to try going to the local [employer] to avoid the heat and humidity. I discussed weather guidelines with [name]. She voiced understanding. She seems motivated to exercise and increase her functional capacity.               Exercise Goals and Review:   Exercise Goals     Row Name 08/15/22 8119 08/31/22 1011 09/21/22 1346 10/25/22 1617       Exercise Goals   Increase Physical Activity Yes Yes Yes Yes    Intervention Provide advice, education, support and counseling about physical activity/exercise needs.;Develop an individualized exercise prescription for aerobic and resistive training based on initial evaluation findings, risk stratification, comorbidities and participant's personal goals. Provide advice, education, support and counseling about physical activity/exercise needs.;Develop an individualized exercise prescription for aerobic and resistive training based on initial evaluation findings, risk stratification, comorbidities and participant's personal goals. Provide advice, education, support and counseling about physical activity/exercise needs.;Develop an individualized exercise prescription for aerobic and resistive training based on initial evaluation findings, risk stratification, comorbidities and participant's personal goals. Provide advice,  education, support and counseling about physical activity/exercise needs.;Develop an individualized exercise prescription for aerobic and resistive training based on initial evaluation findings, risk stratification, comorbidities and participant's personal goals.    Expected Outcomes Short Term: Attend rehab on a regular basis to increase amount of physical activity.;Long Term: Exercising regularly at least 3-5 days a week.;Long Term: Add in home exercise to make exercise part of routine and to increase amount of physical activity. Short Term: Attend rehab on a regular basis to increase amount of physical activity.;Long Term: Exercising regularly at least 3-5 days a week.;Long Term: Add in home exercise to make exercise part of routine and to increase amount of physical activity. Short Term: Attend rehab on a regular basis to increase amount of physical activity.;Long Term: Exercising regularly at least 3-5 days a week.;Long Term: Add in home exercise to make exercise part of routine and to increase amount of physical activity. Short Term: Attend rehab on a regular basis to increase amount of physical activity.;Long Term: Exercising regularly at least 3-5 days a week.;Long Term: Add in home exercise to make exercise part of routine and to increase amount of physical activity.    Increase Strength and Stamina Yes Yes Yes Yes    Intervention Provide advice, education, support and counseling about physical activity/exercise needs.;Develop an individualized exercise prescription for aerobic and resistive training based on initial evaluation findings, risk stratification, comorbidities and participant's personal goals. Provide advice, education, support and counseling about physical activity/exercise needs.;Develop an individualized exercise prescription for aerobic and resistive training based on initial evaluation findings, risk stratification, comorbidities and participant's personal goals. Provide advice,  education, support and counseling  about physical activity/exercise needs.;Develop an individualized exercise prescription for aerobic and resistive training based on initial evaluation findings, risk stratification, comorbidities and participant's personal goals. Provide advice, education, support and counseling about physical activity/exercise needs.;Develop an individualized exercise prescription for aerobic and resistive training based on initial evaluation findings, risk stratification, comorbidities and participant's personal goals.    Expected Outcomes Short Term: Increase workloads from initial exercise prescription for resistance, speed, and METs.;Short Term: Perform resistance training exercises routinely during rehab and add in resistance training at home;Long Term: Improve cardiorespiratory fitness, muscular endurance and strength as measured by increased METs and functional capacity ( ) Short Term: Increase workloads from initial exercise prescription for resistance, speed, and METs.;Short Term: Perform resistance training exercises routinely during rehab and add in resistance training at home;Long Term: Improve cardiorespiratory fitness, muscular endurance and strength as measured by increased METs and functional capacity ( ) Short Term: Increase workloads from initial exercise prescription for resistance, speed, and METs.;Short Term: Perform resistance training exercises routinely during rehab and add in resistance training at home;Long Term: Improve cardiorespiratory fitness, muscular endurance and strength as measured by increased METs and functional capacity ( ) Short Term: Increase workloads from initial exercise prescription for resistance, speed, and METs.;Short Term: Perform resistance training exercises routinely during rehab and add in resistance training at home;Long Term: Improve cardiorespiratory fitness, muscular endurance and strength as measured by increased METs and functional  capacity ( )    Able to understand and use rate of perceived exertion (RPE) scale Yes Yes Yes Yes    Intervention Provide education and explanation on how to use RPE scale Provide education and explanation on how to use RPE scale Provide education and explanation on how to use RPE scale Provide education and explanation on how to use RPE scale    Expected Outcomes Short Term: Able to use RPE daily in rehab to express subjective intensity level;Long Term:  Able to use RPE to guide intensity level when exercising independently Short Term: Able to use RPE daily in rehab to express subjective intensity level;Long Term:  Able to use RPE to guide intensity level when exercising independently Short Term: Able to use RPE daily in rehab to express subjective intensity level;Long Term:  Able to use RPE to guide intensity level when exercising independently Short Term: Able to use RPE daily in rehab to express subjective intensity level;Long Term:  Able to use RPE to guide intensity level when exercising independently    Able to understand and use Dyspnea scale Yes Yes Yes Yes    Intervention Provide education and explanation on how to use Dyspnea scale Provide education and explanation on how to use Dyspnea scale Provide education and explanation on how to use Dyspnea scale Provide education and explanation on how to use Dyspnea scale    Expected Outcomes Short Term: Able to use Dyspnea scale daily in rehab to express subjective sense of shortness of breath during exertion;Long Term: Able to use Dyspnea scale to guide intensity level when exercising independently Short Term: Able to use Dyspnea scale daily in rehab to express subjective sense of shortness of breath during exertion;Long Term: Able to use Dyspnea scale to guide intensity level when exercising independently Short Term: Able to use Dyspnea scale daily in rehab to express subjective sense of shortness of breath during exertion;Long Term: Able to use  Dyspnea scale to guide intensity level when exercising independently Short Term: Able to use Dyspnea scale daily in rehab to express subjective sense of shortness of breath during exertion;Long  Term: Able to use Dyspnea scale to guide intensity level when exercising independently    Knowledge and understanding of Target Heart Rate Range (THRR) Yes Yes Yes Yes    Intervention Provide education and explanation of THRR including how the numbers were predicted and where they are located for reference Provide education and explanation of THRR including how the numbers were predicted and where they are located for reference Provide education and explanation of THRR including how the numbers were predicted and where they are located for reference Provide education and explanation of THRR including how the numbers were predicted and where they are located for reference    Expected Outcomes Short Term: Able to state/look up THRR;Long Term: Able to use THRR to govern intensity when exercising independently;Short Term: Able to use daily as guideline for intensity in rehab Short Term: Able to state/look up THRR;Long Term: Able to use THRR to govern intensity when exercising independently;Short Term: Able to use daily as guideline for intensity in rehab Short Term: Able to state/look up THRR;Long Term: Able to use THRR to govern intensity when exercising independently;Short Term: Able to use daily as guideline for intensity in rehab Short Term: Able to state/look up THRR;Long Term: Able to use THRR to govern intensity when exercising independently;Short Term: Able to use daily as guideline for intensity in rehab    Understanding of Exercise Prescription Yes Yes Yes Yes    Intervention Provide education, explanation, and written materials on patient's individual exercise prescription Provide education, explanation, and written materials on patient's individual exercise prescription Provide education, explanation, and written  materials on patient's individual exercise prescription Provide education, explanation, and written materials on patient's individual exercise prescription    Expected Outcomes Short Term: Able to explain program exercise prescription;Long Term: Able to explain home exercise prescription to exercise independently Short Term: Able to explain program exercise prescription;Long Term: Able to explain home exercise prescription to exercise independently Short Term: Able to explain program exercise prescription;Long Term: Able to explain home exercise prescription to exercise independently Short Term: Able to explain program exercise prescription;Long Term: Able to explain home exercise prescription to exercise independently             Exercise Goals Re-Evaluation :  Exercise Goals Re-Evaluation     Row Name 08/31/22 1011 09/21/22 1346 10/25/22 1618         Exercise Goal Re-Evaluation   Exercise Goals Review Increase Physical Activity;Able to understand and use Dyspnea scale;Understanding of Exercise Prescription;Increase Strength and Stamina;Knowledge and understanding of Target Heart Rate Range (THRR);Able to understand and use rate of perceived exertion (RPE) scale Increase Physical Activity;Able to understand and use Dyspnea scale;Understanding of Exercise Prescription;Increase Strength and Stamina;Knowledge and understanding of Target Heart Rate Range (THRR);Able to understand and use rate of perceived exertion (RPE) scale Increase Physical Activity;Able to understand and use Dyspnea scale;Understanding of Exercise Prescription;Increase Strength and Stamina;Knowledge and understanding of Target Heart Rate Range (THRR);Able to understand and use rate of perceived exertion (RPE) scale     Comments Neysa Bonito has completed 2 exercise sessions. She exercises for 15 min on the recumbent elliptical and treadmill. She averages 2.5 METs at level 2 on the recumbent elliptical and 2.53 METs at 2 mph and 0%  incline. She performs the warmup and cooldown standing without limitations. It is too soon to note any discernable progressions. Will continue to monitor and progress as able. Neysa Bonito has completed 8 exercise sessions. She exercises for 15 min on the recumbent elliptical and treadmill.  She averages 2.5 METs at level 3 on the recumbent elliptical and 2.81 METs at 2 mph and 1% incline. She performs the warmup and cooldown standing without limitations. Neysa Bonito has increased her workload for both exercise modes as METs have increased. She tolerates progressions well. Neysa Bonito seems motivated to exercise and improve her functional capacity. She enjoys class as she converses with other patients. Will continue to monitor and progress as able. Neysa Bonito has completed 16 exercise sessions. She exercises for 15 min on the recumbent elliptical and treadmill. She averages 3.0 METs at level 4 on the recumbent elliptical and 3.1 METs at 2.5 mph and 1.5% incline. She performs the warmup and cooldown standing without limitations. Neysa Bonito is still progressing her workloads steadily. She is determined to reach her full functional capacity in preparation for the [facility] lung transplant program. Neysa Bonito has starting walking around her neighborhood on a regular basis. Will continue to monitor and progress as able.     Expected Outcomes Through exercise at rehab and home, the patient will decrease shortness of breath with daily activities and feel confident in carrying out an exercise regimen at home. Through exercise at rehab and home, the patient will decrease shortness of breath with daily activities and feel confident in carrying out an exercise regimen at home. Through exercise at rehab and home, the patient will decrease shortness of breath with daily activities and feel confident in carrying out an exercise regimen at home.              Discharge Exercise Prescription (Final Exercise Prescription Changes):  Exercise  Prescription Changes - 10/30/22 1100       Response to Exercise   Blood Pressure (Admit) 118/60    Blood Pressure (Exercise) 128/62    Blood Pressure (Exit) 100/68    Heart Rate (Admit) 69 bpm    Heart Rate (Exercise) 84 bpm    Heart Rate (Exit) 72 bpm    Oxygen Saturation (Admit) 99 %    Oxygen Saturation (Exercise) 99 %    Oxygen Saturation (Exit) 97 %    Rating of Perceived Exertion (Exercise) 11    Perceived Dyspnea (Exercise) 1    Duration Continue with 30 min of aerobic exercise without signs/symptoms of physical distress.    Intensity THRR unchanged      Progression   Progression Continue to progress workloads to maintain intensity without signs/symptoms of physical distress.      Resistance Training   Training Prescription Yes    Weight blue bands    Reps 10-15    Time 10 Minutes      Oxygen   Oxygen Continuous    Liters 8-10      Treadmill   MPH 2.5    Grade 1.5    Minutes 15    METs 3.43      Recumbant Elliptical   Level 4    Minutes 15    METs 2.9      Oxygen   Maintain Oxygen Saturation 88% or higher             Nutrition:  Target Goals: Understanding of nutrition guidelines, daily intake of sodium 1500mg , cholesterol 200mg , calories 30% from fat and 7% or less from saturated fats, daily to have 5 or more servings of fruits and vegetables.  Biometrics:    Nutrition Therapy Plan and Nutrition Goals:  Nutrition Therapy & Goals - 10/16/22 1123       Nutrition Therapy   Diet heart healthy diet  Drug/Food Interactions Statins/Certain Fruits      Personal Nutrition Goals   Nutrition Goal Patient to improve diet quality by using the plate method as a guide for meal planning to include lean protein/plant protein, fruits, vegetables, whole grains, nonfat dairy as part of a well-balanced diet.   goal in progress.   Personal Goal #2 Patient to identify strategies for weight loss of 0.5-2.0# per week.   goal not met.   Personal Goal #3 Patient  to identify food sources and limit daily intake of saturated fat, tran fat, sodium, and refined carbohdyrates.   goal in progress.   Comments Goals in progress. We have discussed multiple strategies for weight loss including increasing low calorie dense foods, protein supplements, the plate method as a guide for meal planning, mindful eating strategies, and tracking intake. She has not met weight loss goals and continues to struggle with oversnacking/boredom type snacking and over eating high fat foods (bacon). She is up 4# since starting with our program though down from her highest weight with our program. Neysa Bonito is currently working toward lung transplant through [place]. Per documenation, 6/13, she is working toward an initial weight loss goal of 203# and ultimatley a BMI of 27-28. She reports that multiple weight loss drugs including wegovy have been denied by her insurance company. She is taking prednisone. Recommended starting a Very Low Calorie Diet for 14 days to aid with creating consistent calorie deficit; patient is receptive to this. Patient will benefit from participation in pulmonary rehab for nutrition, exercise, and lifestyle modification.      Intervention Plan   Intervention Prescribe, educate and counsel regarding individualized specific dietary modifications aiming towards targeted core components such as weight, hypertension, lipid management, diabetes, heart failure and other comorbidities.;Nutrition handout(s) given to patient.    Expected Outcomes Short Term Goal: Understand basic principles of dietary content, such as calories, fat, sodium, cholesterol and nutrients.;Long Term Goal: Adherence to prescribed nutrition plan.;Short Term Goal: A plan has been developed with personal nutrition goals set during dietitian appointment.             Nutrition Assessments:  MEDIFICTS Score Key: ?70 Need to make dietary changes  40-70 Heart Healthy Diet ? 40 Therapeutic Level Cholesterol  Diet   Picture Your Plate Scores: <16 Unhealthy dietary pattern with much room for improvement. 41-50 Dietary pattern unlikely to meet recommendations for good health and room for improvement. 51-60 More healthful dietary pattern, with some room for improvement.  >60 Healthy dietary pattern, although there may be some specific behaviors that could be improved.    Nutrition Goals Re-Evaluation:  Nutrition Goals Re-Evaluation     Row Name 08/21/22 1055 09/18/22 1153 10/16/22 1123         Goals   Current Weight 235 lb 7.2 oz (106.8 kg) 242 lb 4.6 oz (109.9 kg) 235 lb 7.2 oz (106.8 kg)     Comment A1c WNL, cholesterol 247, triglycerides 252 no new labs; most recent labs A1c WNL, cholesterol 247, triglycerides 25 252 no new labs; most recent labs A1c WNL, cholesterol 247, triglycerides 252     Expected Outcome Neysa Bonito is currently working toward lung transplant through [place]. Per documenation, 6/13, she is working toward an initial weight loss goal of 203# and ultimatley a BMI of 27-28. She reports that multiple weight loss drugs including wegovy have been denied by her insurance company. She is taking prednisone. She has not started making many dietary changes; she continues snacking on pork rinds and  twizzlers and eats out 1-2x per week including sweet tea at these meals. Patient will benefit from participation in pulmonary rehab for nutrition, exercise, and lifestyle modification. Goals in progress. We have discussed multiple strategies for weight loss including increasing low calorie dense foods, protein supplements, the plate method as a guide for meal planning, mindful eating strategies, and tracking intake. She has not met weight loss goals and continues to struggle with oversnacking/boredom type snacking. She is up 6.8# since starting with our program. Neysa Bonito is currently working toward lung transplant through [place]. Per documenation, 6/13, she is working toward an initial weight loss goal of  203# and ultimatley a BMI of 27-28. She reports that multiple weight loss drugs including wegovy have been denied by her insurance company. She is taking prednisone. Patient will benefit from participation in pulmonary rehab for nutrition, exercise, and lifestyle modification. Goals in progress. We have discussed multiple strategies for weight loss including increasing low calorie dense foods, protein supplements, the plate method as a guide for meal planning, mindful eating strategies, and tracking intake. She has not met weight loss goals and continues to struggle with oversnacking/boredom type snacking and over eating high fat foods (bacon). She is up 4# since starting with our program though down from her highest weight with our program. Neysa Bonito is currently working toward lung transplant through [place]. Per documenation, 6/13, she is working toward an initial weight loss goal of 203# and ultimatley a BMI of 27-28. She reports that multiple weight loss drugs including wegovy have been denied by her insurance company. She is taking prednisone. Recommended starting a Very Low Calorie Diet for 14 days to aid with creating consistent calorie deficit; patient is receptive to this. Patient will benefit from participation in pulmonary rehab for nutrition, exercise, and lifestyle modification.              Nutrition Goals Discharge (Final Nutrition Goals Re-Evaluation):  Nutrition Goals Re-Evaluation - 10/16/22 1123       Goals   Current Weight 235 lb 7.2 oz (106.8 kg)    Comment 252 no new labs; most recent labs A1c WNL, cholesterol 247, triglycerides 252    Expected Outcome Goals in progress. We have discussed multiple strategies for weight loss including increasing low calorie dense foods, protein supplements, the plate method as a guide for meal planning, mindful eating strategies, and tracking intake. She has not met weight loss goals and continues to struggle with oversnacking/boredom type snacking  and over eating high fat foods (bacon). She is up 4# since starting with our program though down from her highest weight with our program. Neysa Bonito is currently working toward lung transplant through [place]. Per documenation, 6/13, she is working toward an initial weight loss goal of 203# and ultimatley a BMI of 27-28. She reports that multiple weight loss drugs including wegovy have been denied by her insurance company. She is taking prednisone. Recommended starting a Very Low Calorie Diet for 14 days to aid with creating consistent calorie deficit; patient is receptive to this. Patient will benefit from participation in pulmonary rehab for nutrition, exercise, and lifestyle modification.             Psychosocial: Target Goals: Acknowledge presence or absence of significant depression and/or stress, maximize coping skills, provide positive support system. Participant is able to verbalize types and ability to use techniques and skills needed for reducing stress and depression.  Initial Review & Psychosocial Screening:  Initial Psych Review & Screening - 08/15/22 4540  Initial Review   Current issues with Current Anxiety/Panic    Comments Pt is on medication for anixety. Feels that anxiety is controlled.      Family Dynamics   Good Support System? Yes    Comments mother, 3 brothers, step mother, and friends      Barriers   Psychosocial barriers to participate in program There are no identifiable barriers or psychosocial needs.      Screening Interventions   Interventions Encouraged to exercise             Quality of Life Scores:  Scores of 19 and below usually indicate a poorer quality of life in these areas.  A difference of  2-3 points is a clinically meaningful difference.  A difference of 2-3 points in the total score of the Quality of Life Index has been associated with significant improvement in overall quality of life, self-image, physical symptoms, and general health in  studies assessing change in quality of life.  PHQ-9: Review Flowsheet       08/15/2022 08/14/2022 06/09/2021  Depression screen PHQ 2/9  Decreased Interest 1 1 1   Down, Depressed, Hopeless 0 0 0  PHQ - 2 Score 1 1 1   Altered sleeping 0 - 1  Tired, decreased energy 0 - 1  Change in appetite 1 - 0  Feeling bad or failure about yourself  0 - 0  Trouble concentrating 0 - 0  Moving slowly or fidgety/restless 0 - 0  Suicidal thoughts 0 - 0  PHQ-9 Score 2 - 3  Difficult doing work/chores Not difficult at all - Not difficult at all    Details           Interpretation of Total Score  Total Score Depression Severity:  1-4 = Minimal depression, 5-9 = Mild depression, 10-14 = Moderate depression, 15-19 = Moderately severe depression, 20-27 = Severe depression   Psychosocial Evaluation and Intervention:  Psychosocial Evaluation - 08/15/22 0919       Psychosocial Evaluation & Interventions   Interventions Encouraged to exercise with the program and follow exercise prescription    Comments Neysa Bonito denies any psychosocial barriers to participating in rehab. Will continue to monitor.    Expected Outcomes For [name] to participate in rehab free of psychosocial barriers.    Continue Psychosocial Services  No Follow up required             Psychosocial Re-Evaluation:  Psychosocial Re-Evaluation     Row Name 09/03/22 0919 09/28/22 0910 10/22/22 1509         Psychosocial Re-Evaluation   Current issues with None Identified None Identified None Identified     Comments Neysa Bonito denies any new psychosocial barriers or concerns at this time. Neysa Bonito continues to deny any psychosocial barriers or concerns at this time. Neysa Bonito again denies any needs from the psychosocial eval. She states she has a therapist she sees and is compliant with taking her psychotropic meds. Neysa Bonito is still being worked up for a possible lung transplant. She is optimistic about the evaluation.     Expected  Outcomes For Neysa Bonito to continue participating in PR free of any psychosocial barriers or concerns For Neysa Bonito to continue participating in PR free of any psychosocial barriers or concerns For Neysa Bonito to continue participating in PR free of any psychosocial barriers or concerns.     Interventions Encouraged to attend Pulmonary Rehabilitation for the exercise Encouraged to attend Pulmonary Rehabilitation for the exercise Encouraged to attend Pulmonary Rehabilitation for the exercise  Continue Psychosocial Services  No Follow up required No Follow up required No Follow up required              Psychosocial Discharge (Final Psychosocial Re-Evaluation):  Psychosocial Re-Evaluation - 10/22/22 1509       Psychosocial Re-Evaluation   Current issues with None Identified    Comments [name] again denies any needs from the psychosocial eval. She states she has a therapist she sees and is compliant with taking her psychotropic meds. Neysa Bonito is still being worked up for a possible lung transplant. She is optimistic about the evaluation.    Expected Outcomes For Neysa Bonito to continue participating in PR free of any psychosocial barriers or concerns.    Interventions Encouraged to attend Pulmonary Rehabilitation for the exercise    Continue Psychosocial Services  No Follow up required             Education: Education Goals: Education classes will be provided on a weekly basis, covering required topics. Participant will state understanding/return demonstration of topics presented.  Learning Barriers/Preferences:  Learning Barriers/Preferences - 08/15/22 0920       Learning Barriers/Preferences   Learning Barriers Sight   wears glasses   Learning Preferences Group Instruction;Individual Instruction;Skilled Demonstration             Education Topics: Introduction to Pulmonary Rehab Group instruction provided by PowerPoint, verbal discussion, and written material to support subject  matter. Instructor reviews what Pulmonary Rehab is, the purpose of the program, and how patients are referred.     Know Your Numbers Group instruction that is supported by a PowerPoint presentation. Instructor discusses importance of knowing and understanding resting, exercise, and post-exercise oxygen saturation, heart rate, and blood pressure. Oxygen saturation, heart rate, blood pressure, rating of perceived exertion, and dyspnea are reviewed along with a normal range for these values.  Flowsheet Row PULMONARY REHAB OTHER RESPIRATORY from 09/06/2022 in [facility], Vascular, & [facility]  Date 09/06/22  Educator EP  Instruction Review Code 1- Verbalizes Understanding       Exercise for the Pulmonary Patient Group instruction that is supported by a PowerPoint presentation. Instructor discusses benefits of exercise, core components of exercise, frequency, duration, and intensity of an exercise routine, importance of utilizing pulse oximetry during exercise, safety while exercising, and options of places to exercise outside of rehab.  Flowsheet Row PULMONARY REHAB OTHER RESPIRATORY from 08/23/2022 in [facility], Vascular, & Lung [facility]  Date 08/23/22  Educator EP  Instruction Review Code 1- Verbalizes Understanding          MET Level  Group instruction provided by PowerPoint, verbal discussion, and written material to support subject matter. Instructor reviews what METs are and how to increase METs.  Flowsheet Row PULMONARY REHAB OTHER RESPIRATORY from 10/25/2022 in [facility] for Heart, Vascular, & Lung Health  Date 10/25/22  Educator EP  Instruction Review Code 1- Verbalizes Understanding       Pulmonary Medications Verbally interactive group education provided by instructor with focus on inhaled medications and proper administration.   Anatomy and Physiology of the Respiratory  System Group instruction provided by PowerPoint, verbal discussion, and written material to support subject matter. Instructor reviews respiratory cycle and anatomical components of the respiratory system and their functions. Instructor also reviews differences in obstructive and restrictive respiratory diseases with examples of each.    Oxygen Safety Group instruction provided by PowerPoint, verbal discussion, and written  material to support subject matter. There is an overview of "What is Oxygen" and "Why do we need it".  Instructor also reviews how to create a safe environment for oxygen use, the importance of using oxygen as prescribed, and the risks of noncompliance. There is a brief discussion on traveling with oxygen and resources the patient may utilize. Flowsheet Row PULMONARY REHAB OTHER RESPIRATORY from 09/13/2022 in [facility] for Heart, Vascular, & Lung Health  Date 09/13/22  Educator RN  Instruction Review Code 1- Verbalizes Understanding       Oxygen Use Group instruction provided by PowerPoint, verbal discussion, and written material to discuss how supplemental oxygen is prescribed and different types of oxygen supply systems. Resources for more information are provided.  Flowsheet Row PULMONARY REHAB OTHER RESPIRATORY from 09/20/2022 in [facility] for Heart, Vascular, & Lung Health  Date 09/20/22  Educator RT  Instruction Review Code 1- Verbalizes Understanding       Breathing Techniques Group instruction that is supported by demonstration and informational handouts. Instructor discusses the benefits of pursed lip and diaphragmatic breathing and detailed demonstration on how to perform both.     Risk Factor Reduction Group instruction that is supported by a PowerPoint presentation. Instructor discusses the definition of a risk factor, different risk factors for pulmonary disease, and how the heart and lungs work  together. Flowsheet Row PULMONARY REHAB OTHER RESPIRATORY from 10/18/2022 in [facility] for Heart, Vascular, & Lung Health  Date 10/18/22  Educator EP  Instruction Review Code 1- Verbalizes Understanding       MD Day A group question and answer session with a medical doctor that allows participants to ask questions that relate to their pulmonary disease state.   Nutrition for the Pulmonary Patient Group instruction provided by PowerPoint slides, verbal discussion, and written materials to support subject matter. The instructor gives an explanation and review of healthy diet recommendations, which includes a discussion on weight management, recommendations for fruit and vegetable consumption, as well as protein, fluid, caffeine, fiber, sodium, sugar, and alcohol. Tips for eating when patients are short of breath are discussed.    Other Education Group or individual verbal, written, or video instructions that support the educational goals of the pulmonary rehab program. Flowsheet Row PULMONARY REHAB OTHER RESPIRATORY from 10/11/2022 in [facility] for Heart, Vascular, & Lung Health  Date 10/11/22  Educator RN  Instruction Review Code 1- Verbalizes Understanding        Knowledge Questionnaire Score:  Knowledge Questionnaire Score - 08/15/22 0929       Knowledge Questionnaire Score   Pre Score 18/18             Core Components/Risk Factors/Patient Goals at Admission:  Personal Goals and Risk Factors at Admission - 08/15/22 0920       Core Components/Risk Factors/Patient Goals on Admission    Weight Management Yes;Weight Loss    Intervention Weight Management: Develop a combined nutrition and exercise program designed to reach desired caloric intake, while maintaining appropriate intake of nutrient and fiber, sodium and fats, and appropriate energy expenditure required for the weight goal.;Weight Management: Provide education and  appropriate resources to help participant work on and attain dietary goals.;Weight Management/Obesity: Establish reasonable short term and long term weight goals.;Obesity: Provide education and appropriate resources to help participant work on and attain dietary goals.    Expected Outcomes Short Term: Continue to assess and modify interventions until short  term weight is achieved;Long Term: Adherence to nutrition and physical activity/exercise program aimed toward attainment of established weight goal;Weight Maintenance: Understanding of the daily nutrition guidelines, which includes 25-35% calories from fat, 7% or less cal from saturated fats, less than 200mg  cholesterol, less than 1.5gm of sodium, & 5 or more servings of fruits and vegetables daily;Weight Loss: Understanding of general recommendations for a balanced deficit meal plan, which promotes 1-2 lb weight loss per week and includes a negative energy balance of [phone] kcal/d;Understanding recommendations for meals to include 15-35% energy as protein, 25-35% energy from fat, 35-60% energy from carbohydrates, less than 200mg  of dietary cholesterol, 20-35 gm of total fiber daily;Understanding of distribution of calorie intake throughout the day with the consumption of 4-5 meals/snacks    Improve shortness of breath with ADL's Yes    Intervention Provide education, individualized exercise plan and daily activity instruction to help decrease symptoms of SOB with activities of daily living.    Expected Outcomes Short Term: Improve cardiorespiratory fitness to achieve a reduction of symptoms when performing ADLs;Long Term: Be able to perform more ADLs without symptoms or delay the onset of symptoms             Core Components/Risk Factors/Patient Goals Review:   Goals and Risk Factor Review     Row Name 09/03/22 0630 09/28/22 0913 10/22/22 1512         Core Components/Risk Factors/Patient Goals Review   Personal Goals Review Weight  Management/Obesity;Improve shortness of breath with ADL's Weight Management/Obesity;Improve shortness of breath with ADL's Weight Management/Obesity;Improve shortness of breath with ADL's     Review Neysa Bonito has recently started the program. She is working with staff dietician to achieve her weight loss goals. She is able to demonstrate purse lip breathing when she gets short of breath. She still gets short of breath when performing her ADL's. Neysa Bonito is enjoying the program so far. We will continue to monitor her progress. Goal progressing for weight loss. Neysa Bonito has been working with our dietician for weight loss goals. Goal progressing on improving her shortness of breath with ADLs. Neysa Bonito is being worked up for lung transplant and remains optimistic through this process. We will continue to monitor her progress throughout the program. Goal in progress to lose weight and make healthy choices. Neysa Bonito is working with our dietician on healthy eating habits and less snacking. Per [facility] lung transplant team recommendations, Neysa Bonito will need to lose weight before surgery. Our dietician has recently suggested a low calorie 14-day plan. Neysa Bonito was excited to see that she lost weight last week. Hopefully this will jump start her weight loss journey and give her the confidence that she can lose weight. Goal in progress on improving her shortness of breath with ADLs. Neysa Bonito is maintaining her oxygen saturation of 88% or higher on 8-10L with exertion. She is able to titrate down to 2L at rest. Neysa Bonito will continue to benefit from PR for nutrition, education, exercise, and lifestyle modification.     Expected Outcomes See admission goals. See admission goals. For [name] to lose weight and improve her shortness of breath with ADLs              Core Components/Risk Factors/Patient Goals at Discharge (Final Review):   Goals and Risk Factor Review - 10/22/22 1512       Core Components/Risk Factors/Patient  Goals Review   Personal Goals Review Weight Management/Obesity;Improve shortness of breath with ADL's    Review Goal in progress to lose weight  and make healthy choices. Neysa Bonito is working with our dietician on healthy eating habits and less snacking. Per [facility] lung transplant team recommendations, Neysa Bonito will need to lose weight before surgery. Our dietician has recently suggested a low calorie 14-day plan. Neysa Bonito was excited to see that she lost weight last week. Hopefully this will jump start her weight loss journey and give her the confidence that she can lose weight. Goal in progress on improving her shortness of breath with ADLs. Neysa Bonito is maintaining her oxygen saturation of 88% or higher on 8-10L with exertion. She is able to titrate down to 2L at rest. Neysa Bonito will continue to benefit from PR for nutrition, education, exercise, and lifestyle modification.    Expected Outcomes For [name] to lose weight and improve her shortness of breath with ADLs             ITP Comments:Pt is making expected progress toward Pulmonary Rehab goals after completing 17 sessions. Recommend continued exercise, life style modification, education, and utilization of breathing techniques to increase stamina and strength, while also decreasing shortness of breath with exertion.  Dr. Mechele Collin is Medical Director for Pulmonary Rehab at [facility].     Comments: Dr. Mechele Collin is Medical Director for Pulmonary Rehab at [facility].

## 2022-11-01 ENCOUNTER — Encounter (HOSPITAL_COMMUNITY)
Admission: RE | Admit: 2022-11-01 | Discharge: 2022-11-01 | Disposition: A | Discharge status: Home / Self Care | Payer: Commercial Managed Care - HMO | Source: Ambulatory Visit | Attending: Pulmonary Disease | Admitting: Pulmonary Disease

## 2022-11-01 DIAGNOSIS — J849 Interstitial pulmonary disease, unspecified: Secondary | ICD-10-CM | POA: Diagnosis not present

## 2022-11-01 NOTE — Progress Notes (Signed)
Daily Session Note  Patient Details  Name: Hannah Klein MRN: [id number] Date of Birth: 19-Jun-1975 Referring Provider:   Doristine Devoid Pulmonary Rehab Walk Test from 08/15/2022 in [facility] for [facility], Vascular, & Lung [facility]  Referring Provider Soskis  [sees [name]]       Encounter Date: 11/01/2022  Check In:  Session Check In - 11/01/22 1142       Check-In   Supervising physician immediately available to respond to emergencies [facility] MD immediately available    Physician(s) Elizabeth Sauer, NP    Location [facility]    Staff Present Elissa Lovett BS, ACSM-CEP, Exercise Physiologist;Kaylee Earlene Plater, MS, ACSM-CEP, Exercise Physiologist;Znya Albino Hermine Messick Bel[place], RD, Dutch Gray, RN, BSN    Virtual Visit No    Medication changes reported     No    Fall or balance concerns reported    No    Tobacco Cessation No Change    Warm-up and Cool-down Performed as group-led instruction    Resistance Training Performed Yes    VAD Patient? No    PAD/SET Patient? No      Pain Assessment   Currently in Pain? No/denies             Capillary Blood Glucose: No results found for this or any previous visit (from the past 24 hour(s)).    Social History   Tobacco Use  Smoking Status Former   Current packs/day: 0.00   Average packs/day: 2.0 packs/day for 18.0 years (36.1 ttl pk-yrs)   Types: Cigarettes   Start date: 49   Quit date: 03/16/2007   Years since quitting: 15.6   Passive exposure: Current  Smokeless Tobacco Never    Goals Met:  Proper associated with RPD/PD & O2 Sat Independence with exercise equipment Exercise tolerated well No report of concerns or symptoms today Strength training completed today  Goals Unmet:  Not Applicable  Comments: Service time is from 1010 to 1152.    Dr. Mechele Collin is Medical Director for Pulmonary Rehab at [facility].

## 2022-11-05 ENCOUNTER — Other Ambulatory Visit (INDEPENDENT_AMBULATORY_CARE_PROVIDER_SITE_OTHER): Payer: Commercial Managed Care - HMO

## 2022-11-05 DIAGNOSIS — Z79899 Other long term (current) drug therapy: Secondary | ICD-10-CM | POA: Diagnosis not present

## 2022-11-05 DIAGNOSIS — Z0184 Encounter for antibody response examination: Secondary | ICD-10-CM

## 2022-11-05 LAB — CBC WITH DIFFERENTIAL/PLATELET
Basophils Absolute: 0.1 10*3/uL (ref 0.0–0.1)
Basophils Relative: 1.2 % (ref 0.0–3.0)
Eosinophils Absolute: 0.2 10*3/uL (ref 0.0–0.7)
Eosinophils Relative: 4.9 % (ref 0.0–5.0)
HCT: 36.1 % (ref 36.0–46.0)
Hemoglobin: 11.7 g/dL — ABNORMAL LOW (ref 12.0–15.0)
Lymphocytes Relative: 18.1 % (ref 12.0–46.0)
Lymphs Abs: 0.9 10*3/uL (ref 0.7–4.0)
MCHC: 32.4 g/dL (ref 30.0–36.0)
MCV: 96.9 fl (ref 78.0–100.0)
Monocytes Absolute: 0.4 10*3/uL (ref 0.1–1.0)
Monocytes Relative: 8 % (ref 3.0–12.0)
Neutro Abs: 3.4 10*3/uL (ref 1.4–7.7)
Neutrophils Relative %: 67.8 % (ref 43.0–77.0)
Platelets: 201 10*3/uL (ref 150.0–400.0)
RBC: 3.72 Mil/uL — ABNORMAL LOW (ref 3.87–5.11)
RDW: 14.7 % (ref 11.5–15.5)
WBC: 5 10*3/uL (ref 4.0–10.5)

## 2022-11-06 ENCOUNTER — Ambulatory Visit: Payer: Commercial Managed Care - HMO | Admitting: Pulmonary Disease

## 2022-11-06 ENCOUNTER — Encounter (HOSPITAL_COMMUNITY)
Admission: RE | Admit: 2022-11-06 | Discharge: 2022-11-06 | Disposition: A | Discharge status: Home / Self Care | Payer: Commercial Managed Care - HMO | Source: Ambulatory Visit | Attending: Pulmonary Disease | Admitting: Pulmonary Disease

## 2022-11-06 ENCOUNTER — Encounter: Payer: Self-pay | Admitting: Pulmonary Disease

## 2022-11-06 VITALS — BP 112/64 | HR 72 | Temp 97.3°F | Ht 69.0 in | Wt 233.3 lb

## 2022-11-06 DIAGNOSIS — J849 Interstitial pulmonary disease, unspecified: Secondary | ICD-10-CM

## 2022-11-06 DIAGNOSIS — Z5181 Encounter for therapeutic drug level monitoring: Secondary | ICD-10-CM | POA: Diagnosis not present

## 2022-11-06 LAB — HEPATITIS B SURFACE ANTIBODY, QUANTITATIVE: Hep B S AB Quant (Post): <5 m[IU]/mL — ABNORMAL LOW (ref >=10)

## 2022-11-06 NOTE — Progress Notes (Signed)
Daily Session Note  Patient Details  Name: Hannah Klein MRN: [id number] Date of Birth: [birth year] Referring Provider:   Doristine Devoid Pulmonary Rehab Walk Test from 08/15/2022 in [facility] for Heart, Vascular, & Lung Health  Referring Provider Soskis  [sees [name]]       Encounter Date: 11/06/2022  Check In:  Session Check In - 11/06/22 1216       Check-In   Supervising physician immediately available to respond to emergencies [facility] MD immediately available    Physician(s) Elizabeth Sauer, NP    Location [facility]    Staff Present Elissa Lovett BS, ACSM-CEP, Exercise Physiologist;Kaylee Earlene Plater, MS, ACSM-CEP, Exercise Physiologist;Casey Hermine Messick Belarus, RD, Dutch Gray, RN, BSN    Virtual Visit No    Medication changes reported     No    Fall or balance concerns reported    No    Tobacco Cessation No Change    Warm-up and Cool-down Performed as group-led instruction    Resistance Training Performed Yes    VAD Patient? No      Pain Assessment   Currently in Pain? No/denies             Capillary Blood Glucose: No results found for this or any previous visit (from the past 24 hour(s)).    Social History   Tobacco Use  Smoking Status Former   Current packs/day: 0.00   Average packs/day: 2.0 packs/day for 18.0 years (36.1 ttl pk-yrs)   Types: Cigarettes   Start date: 40   Quit date: 03/16/2007   Years since quitting: 15.6   Passive exposure: Current  Smokeless Tobacco Never    Goals Met:  Independence with exercise equipment Exercise tolerated well No report of concerns or symptoms today Strength training completed today  Goals Unmet:  Not Applicable  Comments: Service time is from 1018 to 1146.    Dr. Mechele Collin is Medical Director for Pulmonary Rehab at [facility].

## 2022-11-06 NOTE — Progress Notes (Signed)
Hannah Klein    [id number]    Sep 07, 1975  Primary Care Physician:Clark, Keane Scrape, NP  Referring Physician: Doreene Nest, NP [address] 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 who  has a past medical history of Arthritis, Asthma, Environmental allergies, Esophageal dysphagia, Generalized anxiety disorder, GERD (gastroesophageal reflux disease), History of hiatal hernia, colonic polyps, Hypertension, Lung disorder, Migraines, MRSA (methicillin resistant Staphylococcus aureus), and Sleep apnea.   Sent to the ILD clinic by Dr. Delton Coombes for evaluation of interstitial lung disease.  She has history of severe persistent asthma for many years and is followed by Dr. Dellis Anes.  She had been on tezpire for 4 months in 2023.  Last dose of tezpire is in 2024.  In spite of the biologic she had significant dyspnea and noted to have a D-dimer.  She had a CTA which showed groundglass opacities concerning for pneumonitis/pneumonia and was admitted on 01/25/2022.  Treated with antibiotics, prednisone taper with minimal improvement.  She subsequently had surgical lung biopsy Dr. Cliffton Asters in early January 2024 with pathology showing nonspecific pneumonitis/pulmonary fibrosis.  Workup in the past shows borderline elevated rheumatoid factor and she has rheumatology evaluation pending for later this year.  Currently on 30 mg of prednisone which is started on March 29, 2022 with some improvement in symptoms  She is currently on performist and Pulmicort nebulizers.  History also notable for hiatal hernia, esophageal dysphagia and she follows with Dr. Marina Goodell.  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 Consulted with Dr. 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  No feather pillows or comforters ILD questionnaire 04/18/2022-negative Smoking history: 36-pack-year smoker.  Quit in 2009 Travel history: Originally from [place].  No significant recent travel Relevant family history: Brother does have asthma  Interim history: Cytoxan was initiated as her ILD was felt to be rapidly progressive and she got her third dose earlier this month. Her Cellcept has been tapered off and prednisone is down to 15 mg/day  Evaluated by [facility] rheumatology who agrees with Cytoxan.  They will take over Cytoxan therapy to be given at [facility].  They are planning on total 6-12 months of therapy.  Outpatient Encounter Medications as of 11/06/2022  Medication Sig   acetaminophen (TYLENOL) 500 MG tablet Take 2 tablets (1,000 mg total) by mouth every 6 (six) hours as needed.   albuterol (VENTOLIN HFA) 108 (90 Base) MCG/ACT inhaler INHALE 1-2 PUFFS BY MOUTH EVERY 6 HOURS AS NEEDED FOR WHEEZE OR SHORTNESS OF BREATH   Azelastine HCl 137 MCG/SPRAY SOLN PLACE 2 SPRAYS INTO BOTH NOSTRILS 2 (TWO) TIMES DAILY. USE IN EACH NOSTRIL AS DIRECTED   B Complex-C (B-COMPLEX WITH VITAMIN C) tablet Take 1 tablet by mouth daily.   budesonide (PULMICORT) 0.25 MG/2ML nebulizer solution Take 0.25 mg by nebulization 2 (two) times daily.   Cholecalciferol (VITAMIN D3) 125 MCG (5000  UT) TABS Take 5,000 Units by mouth daily.   clobetasol ointment (TEMOVATE) 0.05 % Apply 1 Application topically 2 (two) times daily.   dapsone 25 MG tablet TAKE 2 TABLETS BY MOUTH 2  TIMES DAILY.   diclofenac Sodium (VOLTAREN ARTHRITIS PAIN) 1 % GEL Apply 2 g topically daily as needed (Chest pain).   esomeprazole (NEXIUM) 40 MG capsule TAKE 1 CAPSULE (40 MG TOTAL) BY MOUTH 2 (TWO) TIMES DAILY. FOR HEARTBURN.   famotidine (PEPCID) 40 MG tablet Take 1 tablet (40 mg total) by mouth 2 (two) times daily. for heartburn.   formoterol (PERFOROMIST) 20 MCG/2ML nebulizer solution Take 20 mcg by nebulization 2 (two) times daily.   hydrOXYzine (VISTARIL) 50 MG capsule TAKE 1 CAPSULE BY MOUTH ONCE OR TWICE DAILY AS NEEDED FOR ANXIETY   levocetirizine (XYZAL) 5 MG tablet Take 1 tablet (5 mg total) by mouth 2 (two) times daily. For allergies   mometasone-formoterol (DULERA) 200-5 MCG/ACT AERO Inhale 2 puffs into the lungs every 12 (twelve) hours.   Multiple Vitamin (MULTIVITAMIN) capsule Take 1 capsule by mouth daily.   mycophenolate (CELLCEPT) 500 MG tablet Take 3 tablets (1,500 mg total) by mouth 2 (two) times daily.   Omega-3 Fatty Acids (OMEGA-3 FISH OIL) 300 MG CAPS Take 1,000 mg by mouth.   ondansetron (ZOFRAN) 4 MG tablet Take 1 tablet (4 mg total) by mouth every 8 (eight) hours as needed for nausea or vomiting.   predniSONE (DELTASONE) 10 MG tablet TAKE 2 TABLETS BY MOUTH TWICE DAILY   pregabalin (LYRICA) 25 MG capsule Take 25 mg by mouth 2 (two) times daily.   rosuvastatin (CRESTOR) 10 MG tablet TAKE 1 TABLET BY MOUTH EVERY DAY FOR CHOLESTEROL   sucralfate (CARAFATE) 1 g tablet Take 1 tablet (1 g total) by mouth in the morning, at noon, and at bedtime.   vitamin B-12 (CYANOCOBALAMIN) 1000 MCG tablet Take 1,000 mcg by mouth daily.   vitamin C (ASCORBIC ACID) 500 MG tablet Take 500 mg by mouth daily.   vitamin E 180 MG (400 UNITS) capsule Take 400 Units by mouth daily.   formoterol (PERFOROMIST) 20 MCG/2ML nebulizer solution Take 2 mLs (20 mcg total) by nebulization 2 (two) times daily. (Patient taking differently: Take 20 mcg by nebulization daily as needed (Shortness of breath).)    oxyCODONE-acetaminophen (PERCOCET) 7.5-325 MG tablet Take 1 tablet by mouth every 6 (six) hours as needed for up to 20 doses for severe pain. (Patient not taking: Reported on 11/06/2022)   predniSONE (DELTASONE) 5 MG tablet Take 1 tablet (5 mg total) by mouth daily with breakfast. (Patient not taking: Reported on 11/06/2022)   No facility-administered encounter medications on file as of 11/06/2022.   Physical Exam: Blood pressure 112/64, pulse 72, temperature (!) 97.3 F (36.3 C), temperature source Temporal, height 5\' 9"  (1.753 m), weight 233 lb 4.8 oz (105.8 kg), SpO2 94%. Gen:      No acute distress HEENT:  EOMI, sclera anicteric Neck:     No masses; no thyromegaly Lungs:    Clear to auscultation bilaterally; normal respiratory effort CV:         Regular rate and rhythm; no murmurs Abd:      + bowel sounds; soft, non-tender; no palpable masses, no distension Ext:    No edema; adequate peripheral perfusion Skin:      Warm and dry; no rash Neuro: alert and oriented x 3 Psych: normal mood and affect   Data Reviewed: Imaging: High resolution CT 04/11/2020-clear lungs with  4 mm lower lobe nodule Super D CT 04/07/2021-patchy groundglass opacities in the mid to lower lungs.  Stable lung nodules CTA 01/25/2022-no PE, moderate bilateral peripheral predominant groundglass opacities CT chest 02/22/2022-progressive interstitial lung disease with patchy groundglass, subpleural reticulation, traction bronchiectasis bilaterally with basal gradient.  Probable UIP pattern High resolution CT 04/25/2022-increasingly organized return of groundglass opacities with subpleural reticular densities, traction bronchiectasis.  Indeterminate for UIP High resolution CT 09/07/2022-slight interval progression of interstitial lung disease CT head and neck 09/07/2022-unremarkable I have reviewed the images personally.  PFTs: 03/07/2022 FVC 1.77 [41%], FEV1 1.60 [47%], F/F91, DLCO 7.03 [28%] Severe restrictive physiology.   Severe diffusion impairment.  Labs: Rheumatoid factor 01/25/2022- 41 ANA 01/25/2022-negative CCP 01/26/2022 less than 1 ANCA 08/03/2021-negative  CTD serologies 04/18/2022 ANA 1: 1280 ENA RNP 1.2, SSA greater than 200  Surgical lung biopsy 03/08/2022 No unclassifiable interstitial, chronic fibroinflammatory process.  Monitoring labs CMP 07/30/2022-alk phos 38 WBC 07/30/2022-WBC 16.8, 87.6 neutrophils  Sleep: Overnight oximetry on CPAP, room air dated 06/20/2022 Duration of study 8 hours 40 minutes Time spent less than 88% - 2 hours, 50 minutes Nadir O2 sat of 74%  Cardiac: Echocardiogram 01/26/2022-LVEF 60-65%, normal RV systolic size and function,  Assessment:  Connective tissue disease ILD, Sjogren's syndrome Seen as a second opinion at [facility] who feels that she may have antisynthetase syndrome as well. 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  Due to rapid progression on CT scan and pathology findings with fibroblastic foci we felt that the phenotype is aggressive and recommended aggressive immunotherapy and addition of antifibrotic therapy.  Unfortunately Rituxan has been denied in spite of multiple appeals.  As she has aggressive disease with rapid worsening we have on Cytoxan. We applied for Ofev but has been denied by insurance.  This is temporarily on hold until we can get her immunosuppression optimized  Since she has received 4 doses of Cytoxan and will continue therapy at [facility] rheumatology who plan on 6-12 months of therapy She is off CellCept now.  Continue prednisone at 5 mg/day We can attempt azathioprine instead CellCept in the future.  TPMT levels are normal Since [facility] rheumatology is taking over her immunosuppressive regimen I will let them manage this  Continue transplant evaluation at [facility]  Leukocytosis Labs reviewed with elevation  in WBC count.  She does not have any symptoms of infection and will continue to monitor this. Sputum cultures are done for some reason at [facility] recently and they are growing fungus.  Repeat cultures ordered but not done and follow-up CT chest does not suggest any ongoing infection.  Severe persistent asthma Off tezspire since November 2024 Continue Dulera.  She prefers the inhaler over to performist and Pulmicort nebulizer  OSA Continue CPAP with oxygen  Plan/Recommendations: Follow labs for monitoring Continue monthly Cytoxan.  From this month she will start getting her infusions scheduled at [facility] rheumatology Continue prednisone at 5 mg/day  Chilton Greathouse MD [facility] 11/06/2022, 1:37 PM  CC: Doreene Nest, NP

## 2022-11-06 NOTE — Patient Instructions (Signed)
I am glad you are stable with your breathing Continue Cytoxan as planned at [facility] Follow-up in 3 months

## 2022-11-08 ENCOUNTER — Encounter (HOSPITAL_COMMUNITY)
Admission: RE | Admit: 2022-11-08 | Discharge: 2022-11-08 | Disposition: A | Discharge status: Home / Self Care | Payer: Commercial Managed Care - HMO | Source: Ambulatory Visit | Attending: Pulmonary Disease | Admitting: Pulmonary Disease

## 2022-11-08 DIAGNOSIS — J849 Interstitial pulmonary disease, unspecified: Secondary | ICD-10-CM

## 2022-11-08 NOTE — Progress Notes (Signed)
Daily Session Note  Patient Details  Name: Hannah Klein MRN: [id number] Date of Birth: 1975-05-21 Referring Provider:   Doristine Devoid Pulmonary Rehab Walk Test from 08/15/2022 in [facility]  Referring Provider Soskis  [sees [name]]       Encounter Date: 11/08/2022  Check In:  Session Check In - 11/08/22 1141       Check-In   Supervising physician immediately available to respond to emergencies [facility] MD immediately available    Physician(s) Bernadene Person, NP    Location [facility]    Staff Present Elissa Lovett BS, ACSM-CEP, Exercise Physiologist;Keala Drum Earlene Plater, MS, ACSM-CEP, Exercise Physiologist;Casey Hermine Messick Belarus, RD, Dutch Gray, RN, BSN    Virtual Visit No    Medication changes reported     No    Fall or balance concerns reported    No    Tobacco Cessation No Change    Warm-up and Cool-down Performed as group-led instruction    Resistance Training Performed Yes    VAD Patient? No    PAD/SET Patient? No      Pain Assessment   Currently in Pain? No/denies    Pain Score 0-No pain    Multiple Pain Sites No             Capillary Blood Glucose: No results found for this or any previous visit (from the past 24 hour(s)).    Social History   Tobacco Use  Smoking Status Former   Current packs/day: 0.00   Average packs/day: 2.0 packs/day for 18.0 years (36.1 ttl pk-yrs)   Types: Cigarettes   Start date: 74   Quit date: 03/16/2007   Years since quitting: 15.6   Passive exposure: Current  Smokeless Tobacco Never    Goals Met:  Proper associated with RPD/PD & O2 Sat Independence with exercise equipment Exercise tolerated well No report of concerns or symptoms today Strength training completed today  Goals Unmet:  Not Applicable  Comments: Service time is from 1024 to 1145.    Dr. Mechele Collin is Medical Director for Pulmonary Rehab at [facility].

## 2022-11-13 ENCOUNTER — Encounter (HOSPITAL_COMMUNITY): Payer: Commercial Managed Care - HMO

## 2022-11-14 ENCOUNTER — Ambulatory Visit: Payer: Commercial Managed Care - HMO | Admitting: Primary Care

## 2022-11-15 ENCOUNTER — Encounter (HOSPITAL_COMMUNITY)
Admission: RE | Admit: 2022-11-15 | Discharge: 2022-11-15 | Disposition: A | Discharge status: Home / Self Care | Payer: Commercial Managed Care - HMO | Source: Ambulatory Visit | Attending: Pulmonary Disease | Admitting: Pulmonary Disease

## 2022-11-15 VITALS — Wt 232.8 lb

## 2022-11-15 DIAGNOSIS — J849 Interstitial pulmonary disease, unspecified: Secondary | ICD-10-CM

## 2022-11-15 NOTE — Progress Notes (Signed)
Daily Session Note  Patient Details  Name: Hannah Klein MRN: [id number] Date of Birth: 03-Jan-1976 Referring Provider:   Doristine Devoid Pulmonary Rehab Walk Test from 08/15/2022 in [facility]  Referring Provider Soskis  [sees [name]]       Encounter Date: 11/15/2022  Check In:  Session Check In - 11/15/22 0850       Check-In   Supervising physician immediately available to respond to emergencies [facility] MD immediately available    Physician(s) Jari Favre, NP    Location [facility]    Staff Present Elissa Lovett BS, ACSM-CEP, Exercise Physiologist;Kaylee Earlene Plater, MS, ACSM-CEP, Exercise Physiologist;Casey Synthia Innocent, RN, BSN    Virtual Visit No    Medication changes reported     No    Fall or balance concerns reported    No    Tobacco Cessation No Change    Warm-up and Cool-down Performed as group-led instruction    Resistance Training Performed Yes    VAD Patient? No    PAD/SET Patient? No      Pain Assessment   Currently in Pain? No/denies    Multiple Pain Sites No             Capillary Blood Glucose: No results found for this or any previous visit (from the past 24 hour(s)).    Social History   Tobacco Use  Smoking Status Former   Current packs/day: 0.00   Average packs/day: 2.0 packs/day for 18.0 years (36.1 ttl pk-yrs)   Types: Cigarettes   Start date: 58   Quit date: 03/16/2007   Years since quitting: 15.6   Passive exposure: Current  Smokeless Tobacco Never    Goals Met:  Independence with exercise equipment Exercise tolerated well No report of concerns or symptoms today Strength training completed today  Goals Unmet:  Not Applicable  Comments: Service time is from 0814 to [phone].    Dr. Mechele Collin is Medical Director for Pulmonary Rehab at [facility].

## 2022-11-20 ENCOUNTER — Encounter (HOSPITAL_COMMUNITY): Payer: Commercial Managed Care - HMO

## 2022-11-22 ENCOUNTER — Encounter (HOSPITAL_COMMUNITY): Payer: Commercial Managed Care - HMO

## 2022-11-23 ENCOUNTER — Other Ambulatory Visit: Payer: Self-pay | Admitting: Primary Care

## 2022-11-23 DIAGNOSIS — J849 Interstitial pulmonary disease, unspecified: Secondary | ICD-10-CM

## 2022-11-27 ENCOUNTER — Encounter (HOSPITAL_COMMUNITY): Payer: Commercial Managed Care - HMO

## 2022-11-27 ENCOUNTER — Encounter (HOSPITAL_COMMUNITY): Payer: Self-pay

## 2022-11-28 NOTE — Progress Notes (Signed)
Pulmonary Individual Treatment Plan  Patient Details  Name: Hannah Klein MRN: [id number] Date of Birth: 03/13/1975 Referring Provider:   Doristine Devoid Pulmonary Rehab Walk Test from 08/15/2022 in [facility]  Referring Provider Soskis  [sees [name]]       Initial Encounter Date:  Flowsheet Row Pulmonary Rehab Walk Test from 08/15/2022 in [facility]  Date 08/15/22       Visit Diagnosis: Interstitial lung disease (HCC)  Patient's Home Medications on Admission:   Current Outpatient Medications:    acetaminophen (TYLENOL) 500 MG tablet, Take 2 tablets (1,000 mg total) by mouth every 6 (six) hours as needed., Disp: 30 tablet, Rfl: 0   albuterol (VENTOLIN HFA) 108 (90 Base) MCG/ACT inhaler, INHALE 1-2 PUFFS BY MOUTH EVERY 6 HOURS AS NEEDED FOR WHEEZE OR SHORTNESS OF BREATH, Disp: 18 g, Rfl: 0   Azelastine HCl 137 MCG/SPRAY SOLN, PLACE 2 SPRAYS INTO BOTH NOSTRILS 2 (TWO) TIMES DAILY. USE IN EACH NOSTRIL AS DIRECTED, Disp: 30 mL, Rfl: 5   B Complex-C (B-COMPLEX WITH VITAMIN C) tablet, Take 1 tablet by mouth daily., Disp: , Rfl:    budesonide (PULMICORT) 0.25 MG/2ML nebulizer solution, Take 0.25 mg by nebulization 2 (two) times daily., Disp: , Rfl:    Cholecalciferol (VITAMIN D3) 125 MCG (5000 UT) TABS, Take 5,000 Units by mouth daily., Disp: , Rfl:    clobetasol ointment (TEMOVATE) 0.05 %, Apply 1 Application topically 2 (two) times daily., Disp: 30 g, Rfl: 0   dapsone 25 MG tablet, TAKE 2 TABLETS BY MOUTH 2 TIMES DAILY., Disp: 360 tablet, Rfl: 1   diclofenac Sodium (VOLTAREN ARTHRITIS PAIN) 1 % GEL, Apply 2 g topically daily as needed (Chest pain)., Disp: , Rfl:    esomeprazole (NEXIUM) 40 MG capsule, TAKE 1 CAPSULE (40 MG TOTAL) BY MOUTH 2 (TWO) TIMES DAILY. FOR HEARTBURN., Disp: 180 capsule, Rfl: 2   famotidine (PEPCID) 40 MG tablet, Take 1 tablet (40 mg total) by  mouth 2 (two) times daily. for heartburn., Disp: 180 tablet, Rfl: 2   formoterol (PERFOROMIST) 20 MCG/2ML nebulizer solution, Take 2 mLs (20 mcg total) by nebulization 2 (two) times daily. (Patient taking differently: Take 20 mcg by nebulization daily as needed (Shortness of breath).), Disp: 120 mL, Rfl: 1   formoterol (PERFOROMIST) 20 MCG/2ML nebulizer solution, Take 20 mcg by nebulization 2 (two) times daily., Disp: , Rfl:    hydrOXYzine (VISTARIL) 50 MG capsule, TAKE 1 CAPSULE BY MOUTH ONCE OR TWICE DAILY AS NEEDED FOR ANXIETY, Disp: 180 capsule, Rfl: 2   levocetirizine (XYZAL) 5 MG tablet, Take 1 tablet (5 mg total) by mouth 2 (two) times daily. For allergies, Disp: 180 tablet, Rfl: 3   mometasone-formoterol (DULERA) 200-5 MCG/ACT AERO, Inhale 2 puffs into the lungs every 12 (twelve) hours., Disp: 13 g, Rfl: 5   Multiple Vitamin (MULTIVITAMIN) capsule, Take 1 capsule by mouth daily., Disp: , Rfl:    mycophenolate (CELLCEPT) 500 MG tablet, Take 3 tablets (1,500 mg total) by mouth 2 (two) times daily., Disp: 120 tablet, Rfl: 3   Omega-3 Fatty Acids (OMEGA-3 FISH OIL) 300 MG CAPS, Take 1,000 mg by mouth., Disp: , Rfl:    ondansetron (ZOFRAN) 4 MG tablet, Take 1 tablet (4 mg total) by mouth every 8 (eight) hours as needed for nausea or vomiting., Disp: 20 tablet, Rfl: 0   oxyCODONE-acetaminophen (PERCOCET) 7.5-325 MG tablet, Take 1 tablet by  mouth every 6 (six) hours as needed for up to 20 doses for severe pain. (Patient not taking: Reported on 11/06/2022), Disp: 20 tablet, Rfl: 0   predniSONE (DELTASONE) 10 MG tablet, TAKE 2 TABLETS BY MOUTH TWICE DAILY, Disp: 120 tablet, Rfl: 1   predniSONE (DELTASONE) 5 MG tablet, Take 1 tablet (5 mg total) by mouth daily with breakfast. (Patient not taking: Reported on 11/06/2022), Disp: 90 tablet, Rfl: 1   pregabalin (LYRICA) 25 MG capsule, Take 25 mg by mouth 2 (two) times daily., Disp: , Rfl:    rosuvastatin (CRESTOR) 10 MG tablet, TAKE 1 TABLET BY MOUTH EVERY DAY  FOR CHOLESTEROL, Disp: 90 tablet, Rfl: 2   sucralfate (CARAFATE) 1 g tablet, Take 1 tablet (1 g total) by mouth in the morning, at noon, and at bedtime., Disp: 90 tablet, Rfl: 5   vitamin B-12 (CYANOCOBALAMIN) 1000 MCG tablet, Take 1,000 mcg by mouth daily., Disp: , Rfl:    vitamin C (ASCORBIC ACID) 500 MG tablet, Take 500 mg by mouth daily., Disp: , Rfl:    vitamin E 180 MG (400 UNITS) capsule, Take 400 Units by mouth daily., Disp: , Rfl:   Past Medical History: Past Medical History:  Diagnosis Date   Arthritis    Asthma    Environmental allergies    Esophageal dysphagia    Generalized anxiety disorder    GERD (gastroesophageal reflux disease)    History of hiatal hernia    Hx of colonic polyps    Hypertension    Lung disorder    Patient states she has two unidentified ILD   Migraines    MRSA (methicillin resistant Staphylococcus aureus)    Lt upper arm  approx 8 years ago   Sleep apnea     Tobacco Use: Social History   Tobacco Use  Smoking Status Former   Current packs/day: 0.00   Average packs/day: 2.0 packs/day for 18.0 years (36.1 ttl pk-yrs)   Types: Cigarettes   Start date: 23   Quit date: 03/16/2007   Years since quitting: 15.7   Passive exposure: Current  Smokeless Tobacco Never    Labs: Review Flowsheet  More data exists      Latest Ref Rng & Units 04/07/2019 07/07/2019 03/17/2020 06/09/2021 05/31/2022  Labs for ITP Cardiac and Pulmonary Rehab  Cholestrol 0 - 200 mg/dL 295  621  308  657  846   LDL (calc) 0 - 99 mg/dL 78  962  94  - -  Direct LDL mg/dL - - - 952.8  413.2   HDL-C >39.00 mg/dL 44.01  02.72  53.66  44.03  66.80   Trlycerides 0.0 - 149.0 mg/dL 474.2  595.6  387.5  643.3  252.0   Hemoglobin A1c 4.6 - 6.5 % - - 5.5  5.6  5.5     Details            Capillary Blood Glucose: No results found for: "GLUCAP"   Pulmonary Assessment Scores:  Pulmonary Assessment Scores     Row Name 08/15/22 0925         ADL UCSD   ADL Phase Entry      SOB Score total 65       CAT Score   CAT Score 19       mMRC Score   mMRC Score 4             UCSD: Self-administered rating of dyspnea associated with activities of daily living (ADLs) 6-point scale (0 = "not  at all" to 5 = "maximal or unable to do because of breathlessness")  Scoring Scores range from 0 to 120.  Minimally important difference is 5 units  CAT: CAT can identify the health impairment of COPD patients and is better correlated with disease progression.  CAT has a scoring range of zero to 40. The CAT score is classified into four groups of low (less than 10), medium (10 - 20), high (21-30) and very high (31-40) based on the impact level of disease on health status. A CAT score over 10 suggests significant symptoms.  A worsening CAT score could be explained by an exacerbation, poor medication adherence, poor inhaler technique, or progression of COPD or comorbid conditions.  CAT MCID is 2 points  mMRC: mMRC (Modified Medical Research Council) Dyspnea Scale is used to assess the degree of baseline functional disability in patients of respiratory disease due to dyspnea. No minimal important difference is established. A decrease in score of 1 point or greater is considered a positive change.   Pulmonary Function Assessment:  Pulmonary Function Assessment - 08/15/22 0924       Breath   Shortness of Breath Yes;Limiting activity             Exercise Target Goals: Exercise Program Goal: Individual exercise prescription set using results from initial 6 min walk test and THRR while considering  patient's activity barriers and safety.   Exercise Prescription Goal: Initial exercise prescription builds to 30-45 minutes a day of aerobic activity, 2-3 days per week.  Home exercise guidelines will be given to patient during program as part of exercise prescription that the participant will acknowledge.  Activity Barriers & Risk Stratification:  Activity Barriers & Cardiac  Risk Stratification - 08/15/22 0927       Activity Barriers & Cardiac Risk Stratification   Activity Barriers Deconditioning;Muscular Weakness;Shortness of Breath;Arthritis;Neck/Spine Problems;Joint Problems    Comments Rt shoulder bursitis and hip pain             6 Minute Walk:  6 Minute Walk     Row Name 08/15/22 1030         6 Minute Walk   Phase Initial     Distance 1170 feet     Walk Time 6 minutes     # of Rest Breaks 2  2:29-2:40, 4:56-5:10     MPH 2.22     METS 3.55     RPE 11     Perceived Dyspnea  2     VO2 Peak 12.44     Symptoms No     Resting HR 87 bpm     Resting BP 104/62     Resting Oxygen Saturation  96 %     Exercise Oxygen Saturation  during 6 min walk 85 %     Max Ex. HR 107 bpm     Max Ex. BP 134/70     2 Minute Post BP 118/64       Interval HR   1 Minute HR 104     2 Minute HR 107     3 Minute HR 102     4 Minute HR 98     5 Minute HR 97     6 Minute HR 99     2 Minute Post HR 79     Interval Heart Rate? Yes       Interval Oxygen   Interval Oxygen? Yes     Baseline Oxygen Saturation % 96 %  1 Minute Oxygen Saturation % 94 %     1 Minute Liters of Oxygen 8 L     2 Minute Oxygen Saturation % 90 %     2 Minute Liters of Oxygen 8 L     3 Minute Oxygen Saturation % 91 %  2:29 85%     3 Minute Liters of Oxygen 8 L  increased to 10L     4 Minute Oxygen Saturation % 95 %     4 Minute Liters of Oxygen 10 L     5 Minute Oxygen Saturation % 98 %     5 Minute Liters of Oxygen 10 L     6 Minute Oxygen Saturation % 94 %     6 Minute Liters of Oxygen 10 L     2 Minute Post Oxygen Saturation % 99 %     2 Minute Post Liters of Oxygen 8 L              Oxygen Initial Assessment:  Oxygen Initial Assessment - 08/15/22 0923       Home Oxygen   Home Oxygen Device E-Tanks;Home Concentrator    Sleep Oxygen Prescription Continuous    Liters per minute 4    Home Exercise Oxygen Prescription Continuous    Liters per minute 8    Home  Resting Oxygen Prescription Continuous    Liters per minute 2    Compliance with Home Oxygen Use Yes      Initial 6 min Walk   Oxygen Used Continuous    Liters per minute 8      Program Oxygen Prescription   Program Oxygen Prescription Continuous    Liters per minute 8      Intervention   Short Term Goals To learn and exhibit compliance with exercise, home and travel O2 prescription;To learn and understand importance of maintaining oxygen saturations>88%;To learn and demonstrate proper use of respiratory medications;To learn and understand importance of monitoring SPO2 with pulse oximeter and demonstrate accurate use of the pulse oximeter.;To learn and demonstrate proper pursed lip breathing techniques or other breathing techniques.     Long  Term Goals Exhibits compliance with exercise, home  and travel O2 prescription;Verbalizes importance of monitoring SPO2 with pulse oximeter and return demonstration;Maintenance of O2 saturations>88%;Exhibits proper breathing techniques, such as pursed lip breathing or other method taught during program session;Compliance with respiratory medication;Demonstrates proper use of MDI's             Oxygen Re-Evaluation:  Oxygen Re-Evaluation     Row Name 08/31/22 1015 09/21/22 1351 10/25/22 1620 11/20/22 1631       Program Oxygen Prescription   Program Oxygen Prescription Continuous Continuous Continuous Continuous    Liters per minute 8 10 10 10     Comments -- 94% on 10L 94% on 10L --      Home Oxygen   Home Oxygen Device E-Tanks;Home Concentrator E-Tanks;Home Concentrator E-Tanks;Home Concentrator E-Tanks;Home Concentrator    Sleep Oxygen Prescription Continuous Continuous Continuous Continuous    Liters per minute 4 4 4 4     Home Exercise Oxygen Prescription Continuous Continuous Continuous Continuous    Liters per minute 8 8 8 8     Home Resting Oxygen Prescription Continuous Continuous Continuous Continuous    Liters per minute 2 2 2 2      Compliance with Home Oxygen Use Yes Yes Yes Yes      Goals/Expected Outcomes   Short Term Goals To learn and exhibit compliance with exercise,  home and travel O2 prescription;To learn and understand importance of maintaining oxygen saturations>88%;To learn and demonstrate proper use of respiratory medications;To learn and understand importance of monitoring SPO2 with pulse oximeter and demonstrate accurate use of the pulse oximeter.;To learn and demonstrate proper pursed lip breathing techniques or other breathing techniques.  To learn and exhibit compliance with exercise, home and travel O2 prescription;To learn and understand importance of maintaining oxygen saturations>88%;To learn and demonstrate proper use of respiratory medications;To learn and understand importance of monitoring SPO2 with pulse oximeter and demonstrate accurate use of the pulse oximeter.;To learn and demonstrate proper pursed lip breathing techniques or other breathing techniques.  To learn and exhibit compliance with exercise, home and travel O2 prescription;To learn and understand importance of maintaining oxygen saturations>88%;To learn and demonstrate proper use of respiratory medications;To learn and understand importance of monitoring SPO2 with pulse oximeter and demonstrate accurate use of the pulse oximeter.;To learn and demonstrate proper pursed lip breathing techniques or other breathing techniques.  To learn and exhibit compliance with exercise, home and travel O2 prescription;To learn and understand importance of maintaining oxygen saturations>88%;To learn and demonstrate proper use of respiratory medications;To learn and understand importance of monitoring SPO2 with pulse oximeter and demonstrate accurate use of the pulse oximeter.;To learn and demonstrate proper pursed lip breathing techniques or other breathing techniques.     Long  Term Goals Exhibits compliance with exercise, home  and travel O2 prescription;Verbalizes  importance of monitoring SPO2 with pulse oximeter and return demonstration;Maintenance of O2 saturations>88%;Exhibits proper breathing techniques, such as pursed lip breathing or other method taught during program session;Compliance with respiratory medication;Demonstrates proper use of MDI's Exhibits compliance with exercise, home  and travel O2 prescription;Verbalizes importance of monitoring SPO2 with pulse oximeter and return demonstration;Maintenance of O2 saturations>88%;Exhibits proper breathing techniques, such as pursed lip breathing or other method taught during program session;Compliance with respiratory medication;Demonstrates proper use of MDI's Exhibits compliance with exercise, home  and travel O2 prescription;Verbalizes importance of monitoring SPO2 with pulse oximeter and return demonstration;Maintenance of O2 saturations>88%;Exhibits proper breathing techniques, such as pursed lip breathing or other method taught during program session;Compliance with respiratory medication;Demonstrates proper use of MDI's Exhibits compliance with exercise, home  and travel O2 prescription;Verbalizes importance of monitoring SPO2 with pulse oximeter and return demonstration;Maintenance of O2 saturations>88%;Exhibits proper breathing techniques, such as pursed lip breathing or other method taught during program session;Compliance with respiratory medication;Demonstrates proper use of MDI's    Goals/Expected Outcomes Compliance and understanding of oxygen saturation monitoring and breathing techniques to decrease shortness of breath. Compliance and understanding of oxygen saturation monitoring and breathing techniques to decrease shortness of breath. Compliance and understanding of oxygen saturation monitoring and breathing techniques to decrease shortness of breath. Compliance and understanding of oxygen saturation monitoring and breathing techniques to decrease shortness of breath.             Oxygen  Discharge (Final Oxygen Re-Evaluation):  Oxygen Re-Evaluation - 11/20/22 1631       Program Oxygen Prescription   Program Oxygen Prescription Continuous    Liters per minute 10      Home Oxygen   Home Oxygen Device E-Tanks;Home Concentrator    Sleep Oxygen Prescription Continuous    Liters per minute 4    Home Exercise Oxygen Prescription Continuous    Liters per minute 8    Home Resting Oxygen Prescription Continuous    Liters per minute 2    Compliance with Home Oxygen Use Yes  Goals/Expected Outcomes   Short Term Goals To learn and exhibit compliance with exercise, home and travel O2 prescription;To learn and understand importance of maintaining oxygen saturations>88%;To learn and demonstrate proper use of respiratory medications;To learn and understand importance of monitoring SPO2 with pulse oximeter and demonstrate accurate use of the pulse oximeter.;To learn and demonstrate proper pursed lip breathing techniques or other breathing techniques.     Long  Term Goals Exhibits compliance with exercise, home  and travel O2 prescription;Verbalizes importance of monitoring SPO2 with pulse oximeter and return demonstration;Maintenance of O2 saturations>88%;Exhibits proper breathing techniques, such as pursed lip breathing or other method taught during program session;Compliance with respiratory medication;Demonstrates proper use of MDI's    Goals/Expected Outcomes Compliance and understanding of oxygen saturation monitoring and breathing techniques to decrease shortness of breath.             Initial Exercise Prescription:  Initial Exercise Prescription - 08/15/22 1000       Date of Initial Exercise RX and Referring Provider   Date 08/15/22    Referring Provider Soskis   sees [name]   Expected Discharge Date 11/08/22      Oxygen   Oxygen Continuous    Liters 8      Treadmill   MPH 2    Grade 0    Minutes 15    METs 2.53      Recumbant Elliptical   Level 1    RPM 30     Watts 70    Minutes 15    METs 2.3      Prescription Details   Frequency (times per week) 2    Duration Progress to 30 minutes of continuous aerobic without signs/symptoms of physical distress      Intensity   THRR 40-80% of Max Heartrate 69-138    Ratings of Perceived Exertion 11-13    Perceived Dyspnea 0-4      Progression   Progression Continue to progress workloads to maintain intensity without signs/symptoms of physical distress.      Resistance Training   Training Prescription Yes    Weight blue bands    Reps 10-15             Perform Capillary Blood Glucose checks as needed.  Exercise Prescription Changes:   Exercise Prescription Changes     Row Name 08/21/22 1200 09/04/22 1200 09/18/22 1200 10/02/22 1200 10/16/22 1200     Response to Exercise   Blood Pressure (Admit) 114/66 122/70 124/62 118/62 110/64   Blood Pressure (Exercise) 140/66 146/60 150/60 134/62 120/64   Blood Pressure (Exit) 102/64 108/66 118/60 112/68 110/60   Heart Rate (Admit) 75 bpm 75 bpm 73 bpm 71 bpm 62 bpm   Heart Rate (Exercise) 110 bpm 105 bpm 100 bpm 110 bpm 107 bpm   Heart Rate (Exit) 80 bpm 81 bpm 76 bpm 83 bpm 77 bpm   Oxygen Saturation (Admit) 98 % 96 % 97 % 96 % 98 %   Oxygen Saturation (Exercise) 92 % 92 % 94 % 89 % 85 %  85% on 8L, 90% on 10L   Oxygen Saturation (Exit) 97 % 97 % 97 % 96 % 95 %   Rating of Perceived Exertion (Exercise) 15 13 14 13 13    Perceived Dyspnea (Exercise) 2 2 2 2 2    Symptoms DOE -- DOE -- --   Duration Progress to 30 minutes of  aerobic without signs/symptoms of physical distress Progress to 30 minutes of  aerobic without signs/symptoms  of physical distress Continue with 30 min of aerobic exercise without signs/symptoms of physical distress. Continue with 30 min of aerobic exercise without signs/symptoms of physical distress. Continue with 30 min of aerobic exercise without signs/symptoms of physical distress.   Intensity THRR unchanged THRR unchanged  THRR unchanged THRR unchanged THRR unchanged     Progression   Progression Continue to progress workloads to maintain intensity without signs/symptoms of physical distress. Continue to progress workloads to maintain intensity without signs/symptoms of physical distress. Continue to progress workloads to maintain intensity without signs/symptoms of physical distress. Continue to progress workloads to maintain intensity without signs/symptoms of physical distress. Continue to progress workloads to maintain intensity without signs/symptoms of physical distress.   Average METs 2.3 2.5 -- -- --     Resistance Training   Training Prescription Yes Yes Yes Yes Yes   Weight blue bands blue bands blue bands blue bands blue bands   Reps 10-15 10-15 10-15 10-15 10-15   Time -- 10 Minutes 10 Minutes 10 Minutes 10 Minutes     Interval Training   Interval Training -- No -- -- --     Oxygen   Oxygen Continuous Continuous Continuous Continuous Continuous   Liters 10 10 10  8-10 8-10     Treadmill   MPH 1.8 2 2 2  2.4   Grade 0 0 1 1 1.5   Minutes 15 15 15 15 15    METs 2.38 2.53 2.81 2.81 3.33     Recumbant Elliptical   Level 2 2 3 3 3    RPM 40 -- 40 -- --   Minutes 15 15 15 15 15    METs 2.1 2.5 2.6 2.3 3.1     Oxygen   Maintain Oxygen Saturation 88% or higher 88% or higher 88% or higher 88% or higher 88% or higher    Row Name 10/30/22 1100 11/15/22 1221 11/27/22 1200         Response to Exercise   Blood Pressure (Admit) 118/60 120/58 --     Blood Pressure (Exercise) 128/62 -- --     Blood Pressure (Exit) 100/68 98/52 --     Heart Rate (Admit) 69 bpm 70 bpm --     Heart Rate (Exercise) 84 bpm -- --     Heart Rate (Exit) 72 bpm 88 bpm --     Oxygen Saturation (Admit) 99 % 97 %  3L --     Oxygen Saturation (Exercise) 99 % 92 %  10L --     Oxygen Saturation (Exit) 97 % 92 %  3L --     Rating of Perceived Exertion (Exercise) 11 13 --     Perceived Dyspnea (Exercise) 1 2 --     Duration  Continue with 30 min of aerobic exercise without signs/symptoms of physical distress. Continue with 30 min of aerobic exercise without signs/symptoms of physical distress. --     Intensity THRR unchanged THRR unchanged --       Progression   Progression Continue to progress workloads to maintain intensity without signs/symptoms of physical distress. Continue to progress workloads to maintain intensity without signs/symptoms of physical distress. --       Paramedic Prescription Yes Yes --     Weight blue bands blue bands --     Reps 10-15 10-15 --     Time 10 Minutes 10 Minutes --       Oxygen   Oxygen Continuous Continuous --     Liters  8-10 --  3L with rest, 10L with exertion --       Treadmill   MPH 2.5 2.5 --     Grade 1.5 2 --     Minutes 15 15 --     METs 3.43 3.6 --       Recumbant Elliptical   Level 4 4 --     Minutes 15 15 --     METs 2.9 2.9 --       Oxygen   Maintain Oxygen Saturation 88% or higher 88% or higher --              Exercise Comments:   Exercise Comments     Row Name 08/21/22 1155 10/02/22 1539         Exercise Comments Pt completed first day of group exercise. She exercised on the recumbent elliptical for 15 min, level2, METs 2.1. She then walked on the TM for 15 min, speed 1.8 for 12 min, 1.2 mph for 3 min. Tolerated fair. She tolerated warm up and cool down well, performed squats. Discussed METs. Pt is generally overwhelmed by her diagnosis but is managing. Completed home ExRx. Hannah Klein is currently walking at home. She walks 1 non-rehab day/wk for 10-15 min/day. We discussed Lung Transplant guidelines and building to meeting those guidelines. I mentioned walking 3 non-rehab days/wk for at least 20 min/day. She agreed with my recommendations. I discussed increasing her walking time first and then frequency. I recommended doing a slow progression to meeting transplant guidelines. Hannah Klein agreed to my progression plan. We also  discussed exercise strategies as Hannah Klein is walking outside. She is willing to try going to the local [employer] to avoid the heat and humidity. I discussed weather guidelines with [name]. She voiced understanding. She seems motivated to exercise and increase her functional capacity.               Exercise Goals and Review:   Exercise Goals     Row Name 08/15/22 1610 08/31/22 1011 09/21/22 1346 10/25/22 1617       Exercise Goals   Increase Physical Activity Yes Yes Yes Yes    Intervention Provide advice, education, support and counseling about physical activity/exercise needs.;Develop an individualized exercise prescription for aerobic and resistive training based on initial evaluation findings, risk stratification, comorbidities and participant's personal goals. Provide advice, education, support and counseling about physical activity/exercise needs.;Develop an individualized exercise prescription for aerobic and resistive training based on initial evaluation findings, risk stratification, comorbidities and participant's personal goals. Provide advice, education, support and counseling about physical activity/exercise needs.;Develop an individualized exercise prescription for aerobic and resistive training based on initial evaluation findings, risk stratification, comorbidities and participant's personal goals. Provide advice, education, support and counseling about physical activity/exercise needs.;Develop an individualized exercise prescription for aerobic and resistive training based on initial evaluation findings, risk stratification, comorbidities and participant's personal goals.    Expected Outcomes Short Term: Attend rehab on a regular basis to increase amount of physical activity.;Long Term: Exercising regularly at least 3-5 days a week.;Long Term: Add in home exercise to make exercise part of routine and to increase amount of physical activity. Short Term: Attend rehab on a regular basis to  increase amount of physical activity.;Long Term: Exercising regularly at least 3-5 days a week.;Long Term: Add in home exercise to make exercise part of routine and to increase amount of physical activity. Short Term: Attend rehab on a regular basis to increase amount of physical activity.;Long Term: Exercising regularly at  least 3-5 days a week.;Long Term: Add in home exercise to make exercise part of routine and to increase amount of physical activity. Short Term: Attend rehab on a regular basis to increase amount of physical activity.;Long Term: Exercising regularly at least 3-5 days a week.;Long Term: Add in home exercise to make exercise part of routine and to increase amount of physical activity.    Increase Strength and Stamina Yes Yes Yes Yes    Intervention Provide advice, education, support and counseling about physical activity/exercise needs.;Develop an individualized exercise prescription for aerobic and resistive training based on initial evaluation findings, risk stratification, comorbidities and participant's personal goals. Provide advice, education, support and counseling about physical activity/exercise needs.;Develop an individualized exercise prescription for aerobic and resistive training based on initial evaluation findings, risk stratification, comorbidities and participant's personal goals. Provide advice, education, support and counseling about physical activity/exercise needs.;Develop an individualized exercise prescription for aerobic and resistive training based on initial evaluation findings, risk stratification, comorbidities and participant's personal goals. Provide advice, education, support and counseling about physical activity/exercise needs.;Develop an individualized exercise prescription for aerobic and resistive training based on initial evaluation findings, risk stratification, comorbidities and participant's personal goals.    Expected Outcomes Short Term: Increase  workloads from initial exercise prescription for resistance, speed, and METs.;Short Term: Perform resistance training exercises routinely during rehab and add in resistance training at home;Long Term: Improve cardiorespiratory fitness, muscular endurance and strength as measured by increased METs and functional capacity ( ) Short Term: Increase workloads from initial exercise prescription for resistance, speed, and METs.;Short Term: Perform resistance training exercises routinely during rehab and add in resistance training at home;Long Term: Improve cardiorespiratory fitness, muscular endurance and strength as measured by increased METs and functional capacity ( ) Short Term: Increase workloads from initial exercise prescription for resistance, speed, and METs.;Short Term: Perform resistance training exercises routinely during rehab and add in resistance training at home;Long Term: Improve cardiorespiratory fitness, muscular endurance and strength as measured by increased METs and functional capacity ( ) Short Term: Increase workloads from initial exercise prescription for resistance, speed, and METs.;Short Term: Perform resistance training exercises routinely during rehab and add in resistance training at home;Long Term: Improve cardiorespiratory fitness, muscular endurance and strength as measured by increased METs and functional capacity ( )    Able to understand and use rate of perceived exertion (RPE) scale Yes Yes Yes Yes    Intervention Provide education and explanation on how to use RPE scale Provide education and explanation on how to use RPE scale Provide education and explanation on how to use RPE scale Provide education and explanation on how to use RPE scale    Expected Outcomes Short Term: Able to use RPE daily in rehab to express subjective intensity level;Long Term:  Able to use RPE to guide intensity level when exercising independently Short Term: Able to use RPE daily in rehab to  express subjective intensity level;Long Term:  Able to use RPE to guide intensity level when exercising independently Short Term: Able to use RPE daily in rehab to express subjective intensity level;Long Term:  Able to use RPE to guide intensity level when exercising independently Short Term: Able to use RPE daily in rehab to express subjective intensity level;Long Term:  Able to use RPE to guide intensity level when exercising independently    Able to understand and use Dyspnea scale Yes Yes Yes Yes    Intervention Provide education and explanation on how to use Dyspnea scale Provide education and explanation on how to  use Dyspnea scale Provide education and explanation on how to use Dyspnea scale Provide education and explanation on how to use Dyspnea scale    Expected Outcomes Short Term: Able to use Dyspnea scale daily in rehab to express subjective sense of shortness of breath during exertion;Long Term: Able to use Dyspnea scale to guide intensity level when exercising independently Short Term: Able to use Dyspnea scale daily in rehab to express subjective sense of shortness of breath during exertion;Long Term: Able to use Dyspnea scale to guide intensity level when exercising independently Short Term: Able to use Dyspnea scale daily in rehab to express subjective sense of shortness of breath during exertion;Long Term: Able to use Dyspnea scale to guide intensity level when exercising independently Short Term: Able to use Dyspnea scale daily in rehab to express subjective sense of shortness of breath during exertion;Long Term: Able to use Dyspnea scale to guide intensity level when exercising independently    Knowledge and understanding of Target Heart Rate Range (THRR) Yes Yes Yes Yes    Intervention Provide education and explanation of THRR including how the numbers were predicted and where they are located for reference Provide education and explanation of THRR including how the numbers were predicted  and where they are located for reference Provide education and explanation of THRR including how the numbers were predicted and where they are located for reference Provide education and explanation of THRR including how the numbers were predicted and where they are located for reference    Expected Outcomes Short Term: Able to state/look up THRR;Long Term: Able to use THRR to govern intensity when exercising independently;Short Term: Able to use daily as guideline for intensity in rehab Short Term: Able to state/look up THRR;Long Term: Able to use THRR to govern intensity when exercising independently;Short Term: Able to use daily as guideline for intensity in rehab Short Term: Able to state/look up THRR;Long Term: Able to use THRR to govern intensity when exercising independently;Short Term: Able to use daily as guideline for intensity in rehab Short Term: Able to state/look up THRR;Long Term: Able to use THRR to govern intensity when exercising independently;Short Term: Able to use daily as guideline for intensity in rehab    Understanding of Exercise Prescription Yes Yes Yes Yes    Intervention Provide education, explanation, and written materials on patient's individual exercise prescription Provide education, explanation, and written materials on patient's individual exercise prescription Provide education, explanation, and written materials on patient's individual exercise prescription Provide education, explanation, and written materials on patient's individual exercise prescription    Expected Outcomes Short Term: Able to explain program exercise prescription;Long Term: Able to explain home exercise prescription to exercise independently Short Term: Able to explain program exercise prescription;Long Term: Able to explain home exercise prescription to exercise independently Short Term: Able to explain program exercise prescription;Long Term: Able to explain home exercise prescription to exercise  independently Short Term: Able to explain program exercise prescription;Long Term: Able to explain home exercise prescription to exercise independently             Exercise Goals Re-Evaluation :  Exercise Goals Re-Evaluation     Row Name 08/31/22 1011 09/21/22 1346 10/25/22 1618 11/20/22 1629       Exercise Goal Re-Evaluation   Exercise Goals Review Increase Physical Activity;Able to understand and use Dyspnea scale;Understanding of Exercise Prescription;Increase Strength and Stamina;Knowledge and understanding of Target Heart Rate Range (THRR);Able to understand and use rate of perceived exertion (RPE) scale Increase Physical Activity;Able to  understand and use Dyspnea scale;Understanding of Exercise Prescription;Increase Strength and Stamina;Knowledge and understanding of Target Heart Rate Range (THRR);Able to understand and use rate of perceived exertion (RPE) scale Increase Physical Activity;Able to understand and use Dyspnea scale;Understanding of Exercise Prescription;Increase Strength and Stamina;Knowledge and understanding of Target Heart Rate Range (THRR);Able to understand and use rate of perceived exertion (RPE) scale Increase Physical Activity;Able to understand and use Dyspnea scale;Understanding of Exercise Prescription;Increase Strength and Stamina;Knowledge and understanding of Target Heart Rate Range (THRR);Able to understand and use rate of perceived exertion (RPE) scale    Comments Hannah Klein has completed 2 exercise sessions. She exercises for 15 min on the recumbent elliptical and treadmill. She averages 2.5 METs at level 2 on the recumbent elliptical and 2.53 METs at 2 mph and 0% incline. She performs the warmup and cooldown standing without limitations. It is too soon to note any discernable progressions. Will continue to monitor and progress as able. Hannah Klein has completed 8 exercise sessions. She exercises for 15 min on the recumbent elliptical and treadmill. She averages 2.5  METs at level 3 on the recumbent elliptical and 2.81 METs at 2 mph and 1% incline. She performs the warmup and cooldown standing without limitations. Hannah Klein has increased her workload for both exercise modes as METs have increased. She tolerates progressions well. Hannah Klein seems motivated to exercise and improve her functional capacity. She enjoys class as she converses with other patients. Will continue to monitor and progress as able. Hannah Klein has completed 16 exercise sessions. She exercises for 15 min on the recumbent elliptical and treadmill. She averages 3.0 METs at level 4 on the recumbent elliptical and 3.1 METs at 2.5 mph and 1.5% incline. She performs the warmup and cooldown standing without limitations. Hannah Klein is still progressing her workloads steadily. She is determined to reach her full functional capacity in preparation for the [facility] lung transplant program. Hannah Klein has starting walking around her neighborhood on a regular basis. Will continue to monitor and progress as able. Hannah Klein has completed 21 exercise sessions. She exercises for 15 min on the recumbent elliptical and treadmill. She averages 3.0 METs at level 4 on the recumbent elliptical and 3.3 METs at 2.5 mph and 2% incline. She performs the warmup and cooldown standing without limitations. Hannah Klein is still trying to progress workloads. She increased her workload slightly on the treadmill. I have asked for an update on her home exercise regimen. Hannah Klein is walking out home on a regular basis. Will continue to monitor and progress as able.    Expected Outcomes Through exercise at rehab and home, the patient will decrease shortness of breath with daily activities and feel confident in carrying out an exercise regimen at home. Through exercise at rehab and home, the patient will decrease shortness of breath with daily activities and feel confident in carrying out an exercise regimen at home. Through exercise at rehab and home, the patient will  decrease shortness of breath with daily activities and feel confident in carrying out an exercise regimen at home. Through exercise at rehab and home, the patient will decrease shortness of breath with daily activities and feel confident in carrying out an exercise regimen at home.             Discharge Exercise Prescription (Final Exercise Prescription Changes):  Exercise Prescription Changes - 11/27/22 1200       Response to Exercise   Blood Pressure (Admit) --    Blood Pressure (Exit) --    Heart Rate (Admit) --  Heart Rate (Exit) --    Oxygen Saturation (Admit) --    Oxygen Saturation (Exercise) --    Oxygen Saturation (Exit) --    Rating of Perceived Exertion (Exercise) --    Perceived Dyspnea (Exercise) --    Duration --    Intensity --      Progression   Progression --      Resistance Training   Training Prescription --    Weight --    Reps --    Time --      Oxygen   Oxygen --    Liters --      Treadmill   MPH --    Grade --    Minutes --    METs --      Recumbant Elliptical   Level --    Minutes --    METs --      Oxygen   Maintain Oxygen Saturation --             Nutrition:  Target Goals: Understanding of nutrition guidelines, daily intake of sodium 1500mg , cholesterol 200mg , calories 30% from fat and 7% or less from saturated fats, daily to have 5 or more servings of fruits and vegetables.  Biometrics:    Nutrition Therapy Plan and Nutrition Goals:  Nutrition Therapy & Goals - 11/13/22 1527       Nutrition Therapy   Diet heart healthy diet    Drug/Food Interactions Statins/Certain Fruits      Personal Nutrition Goals   Nutrition Goal Patient to improve diet quality by using the plate method as a guide for meal planning to include lean protein/plant protein, fruits, vegetables, whole grains, nonfat dairy as part of a well-balanced diet.   goal in progress.   Personal Goal #2 Patient to identify strategies for weight loss of  0.5-2.0# per week.   goal in progress.   Personal Goal #3 Patient to identify food sources and limit daily intake of saturated fat, tran fat, sodium, and refined carbohdyrates.   goal in progress.   Comments Goals in action. We have discussed multiple strategies for weight loss including increasing low calorie dense foods, protein supplements, the plate method as a guide for meal planning, mindful eating strategies, and tracking intake. She was not successful with weight loss with these methods; recommended starting a Very Low Calorie Diet for 14-30 days to aid with creating consistent calorie deficit  She starting this and  is down 10# (105.2kg/231.4#)  from her highest weight with our program (109.8kg).  Hannah Klein is currently working toward lung transplant through [place]. Per documenation, 6/13, she is working toward an initial weight loss goal of 203# and ultimatley a BMI of 27-28. She reports that multiple weight loss drugs including wegovy have been denied by her insurance company. Patient will benefit from participation in pulmonary rehab for nutrition, exercise, and lifestyle modification.      Intervention Plan   Intervention Prescribe, educate and counsel regarding individualized specific dietary modifications aiming towards targeted core components such as weight, hypertension, lipid management, diabetes, heart failure and other comorbidities.;Nutrition handout(s) given to patient.    Expected Outcomes Short Term Goal: Understand basic principles of dietary content, such as calories, fat, sodium, cholesterol and nutrients.;Long Term Goal: Adherence to prescribed nutrition plan.;Short Term Goal: A plan has been developed with personal nutrition goals set during dietitian appointment.             Nutrition Assessments:  MEDIFICTS Score Key: >=70 Need to make dietary  changes  40-70 Heart Healthy Diet <= 40 Therapeutic Level Cholesterol Diet   Picture Your Plate Scores: <40 Unhealthy  dietary pattern with much room for improvement. 41-50 Dietary pattern unlikely to meet recommendations for good health and room for improvement. 51-60 More healthful dietary pattern, with some room for improvement.  >60 Healthy dietary pattern, although there may be some specific behaviors that could be improved.    Nutrition Goals Re-Evaluation:  Nutrition Goals Re-Evaluation     Row Name 08/21/22 1055 09/18/22 1153 10/16/22 1123 11/13/22 1527       Goals   Current Weight 235 lb 7.2 oz (106.8 kg) 242 lb 4.6 oz (109.9 kg) 235 lb 7.2 oz (106.8 kg) 231 lb 14.8 oz (105.2 kg)    Comment A1c WNL, cholesterol 247, triglycerides 252 no new labs; most recent labs A1c WNL, cholesterol 247, triglycerides 25 252 no new labs; most recent labs A1c WNL, cholesterol 247, triglycerides 252 no new labs; most recent labs A1c WNL, cholesterol 247, triglycerides 252    Expected Outcome Hannah Klein is currently working toward lung transplant through [place]. Per documenation, 6/13, she is working toward an initial weight loss goal of 203# and ultimatley a BMI of 27-28. She reports that multiple weight loss drugs including wegovy have been denied by her insurance company. She is taking prednisone. She has not started making many dietary changes; she continues snacking on pork rinds and twizzlers and eats out 1-2x per week including sweet tea at these meals. Patient will benefit from participation in pulmonary rehab for nutrition, exercise, and lifestyle modification. Goals in progress. We have discussed multiple strategies for weight loss including increasing low calorie dense foods, protein supplements, the plate method as a guide for meal planning, mindful eating strategies, and tracking intake. She has not met weight loss goals and continues to struggle with oversnacking/boredom type snacking. She is up 6.8# since starting with our program. Hannah Klein is currently working toward lung transplant through [place]. Per documenation,  6/13, she is working toward an initial weight loss goal of 203# and ultimatley a BMI of 27-28. She reports that multiple weight loss drugs including wegovy have been denied by her insurance company. She is taking prednisone. Patient will benefit from participation in pulmonary rehab for nutrition, exercise, and lifestyle modification. Goals in progress. We have discussed multiple strategies for weight loss including increasing low calorie dense foods, protein supplements, the plate method as a guide for meal planning, mindful eating strategies, and tracking intake. She has not met weight loss goals and continues to struggle with oversnacking/boredom type snacking and over eating high fat foods (bacon). She is up 4# since starting with our program though down from her highest weight with our program. Hannah Klein is currently working toward lung transplant through [place]. Per documenation, 6/13, she is working toward an initial weight loss goal of 203# and ultimatley a BMI of 27-28. She reports that multiple weight loss drugs including wegovy have been denied by her insurance company. She is taking prednisone. Recommended starting a Very Low Calorie Diet for 14 days to aid with creating consistent calorie deficit; patient is receptive to this. Patient will benefit from participation in pulmonary rehab for nutrition, exercise, and lifestyle modification. Goals in action. We have discussed multiple strategies for weight loss including increasing low calorie dense foods, protein supplements, the plate method as a guide for meal planning, mindful eating strategies, and tracking intake. She was not successful with weight loss with these methods; recommended starting a Very Low  Calorie Diet for 14-30 days to aid with creating consistent calorie deficit She starting this and is down 10# (105.2kg/231.4#) from her highest weight with our program (109.8kg). Hannah Klein is currently working toward lung transplant through [place]. Per  documenation, 6/13, she is working toward an initial weight loss goal of 203# and ultimatley a BMI of 27-28. She reports that multiple weight loss drugs including wegovy have been denied by her insurance company. Patient will benefit from participation in pulmonary rehab for nutrition, exercise, and lifestyle modification.             Nutrition Goals Discharge (Final Nutrition Goals Re-Evaluation):  Nutrition Goals Re-Evaluation - 11/13/22 1527       Goals   Current Weight 231 lb 14.8 oz (105.2 kg)    Comment no new labs; most recent labs A1c WNL, cholesterol 247, triglycerides 252    Expected Outcome Goals in action. We have discussed multiple strategies for weight loss including increasing low calorie dense foods, protein supplements, the plate method as a guide for meal planning, mindful eating strategies, and tracking intake. She was not successful with weight loss with these methods; recommended starting a Very Low Calorie Diet for 14-30 days to aid with creating consistent calorie deficit She starting this and is down 10# (105.2kg/231.4#) from her highest weight with our program (109.8kg). Hannah Klein is currently working toward lung transplant through [place]. Per documenation, 6/13, she is working toward an initial weight loss goal of 203# and ultimatley a BMI of 27-28. She reports that multiple weight loss drugs including wegovy have been denied by her insurance company. Patient will benefit from participation in pulmonary rehab for nutrition, exercise, and lifestyle modification.             Psychosocial: Target Goals: Acknowledge presence or absence of significant depression and/or stress, maximize coping skills, provide positive support system. Participant is able to verbalize types and ability to use techniques and skills needed for reducing stress and depression.  Initial Review & Psychosocial Screening:  Initial Psych Review & Screening - 08/15/22 7846       Initial Review    Current issues with Current Anxiety/Panic    Comments Pt is on medication for anixety. Feels that anxiety is controlled.      Family Dynamics   Good Support System? Yes    Comments mother, 3 brothers, step mother, and friends      Barriers   Psychosocial barriers to participate in program There are no identifiable barriers or psychosocial needs.      Screening Interventions   Interventions Encouraged to exercise             Quality of Life Scores:  Scores of 19 and below usually indicate a poorer quality of life in these areas.  A difference of  2-3 points is a clinically meaningful difference.  A difference of 2-3 points in the total score of the Quality of Life Index has been associated with significant improvement in overall quality of life, self-image, physical symptoms, and general health in studies assessing change in quality of life.  PHQ-9: Review Flowsheet       08/15/2022 08/14/2022 06/09/2021  Depression screen PHQ 2/9  Decreased Interest 1 1 1   Down, Depressed, Hopeless 0 0 0  PHQ - 2 Score 1 1 1   Altered sleeping 0 - 1  Tired, decreased energy 0 - 1  Change in appetite 1 - 0  Feeling bad or failure about yourself  0 - 0  Trouble  concentrating 0 - 0  Moving slowly or fidgety/restless 0 - 0  Suicidal thoughts 0 - 0  PHQ-9 Score 2 - 3  Difficult doing work/chores Not difficult at all - Not difficult at all    Details           Interpretation of Total Score  Total Score Depression Severity:  1-4 = Minimal depression, 5-9 = Mild depression, 10-14 = Moderate depression, 15-19 = Moderately severe depression, 20-27 = Severe depression   Psychosocial Evaluation and Intervention:  Psychosocial Evaluation - 08/15/22 0919       Psychosocial Evaluation & Interventions   Interventions Encouraged to exercise with the program and follow exercise prescription    Comments Hannah Klein denies any psychosocial barriers to participating in rehab. Will continue to monitor.     Expected Outcomes For [name] to participate in rehab free of psychosocial barriers.    Continue Psychosocial Services  No Follow up required             Psychosocial Re-Evaluation:  Psychosocial Re-Evaluation     Row Name 09/03/22 0919 09/28/22 0910 10/22/22 1509 11/19/22 0914       Psychosocial Re-Evaluation   Current issues with None Identified None Identified None Identified None Identified    Comments Hannah Klein denies any new psychosocial barriers or concerns at this time. Hannah Klein continues to deny any psychosocial barriers or concerns at this time. Hannah Klein again denies any needs from the psychosocial eval. She states she has a therapist she sees and is compliant with taking her psychotropic meds. Hannah Klein is still being worked up for a possible lung transplant. She is optimistic about the evaluation. Hannah Klein again denies any needs from the psychosocial eval. She states she has a therapist she sees and is compliant with taking her psychotropic meds. Hannah Klein is still being worked up for a possible lung transplant. She is currently going to [facility] for procedures. She is optimistic about the evaluation.    Expected Outcomes For Hannah Klein to continue participating in PR free of any psychosocial barriers or concerns For Hannah Klein to continue participating in PR free of any psychosocial barriers or concerns For Hannah Klein to continue participating in PR free of any psychosocial barriers or concerns. For Hannah Klein to continue participating in PR free of any psychosocial barriers or concerns.    Interventions Encouraged to attend Pulmonary Rehabilitation for the exercise Encouraged to attend Pulmonary Rehabilitation for the exercise Encouraged to attend Pulmonary Rehabilitation for the exercise Encouraged to attend Pulmonary Rehabilitation for the exercise    Continue Psychosocial Services  No Follow up required No Follow up required No Follow up required No Follow up required             Psychosocial  Discharge (Final Psychosocial Re-Evaluation):  Psychosocial Re-Evaluation - 11/19/22 0914       Psychosocial Re-Evaluation   Current issues with None Identified    Comments Hannah Klein again denies any needs from the psychosocial eval. She states she has a therapist she sees and is compliant with taking her psychotropic meds. Hannah Klein is still being worked up for a possible lung transplant. She is currently going to [facility] for procedures. She is optimistic about the evaluation.    Expected Outcomes For Hannah Klein to continue participating in PR free of any psychosocial barriers or concerns.    Interventions Encouraged to attend Pulmonary Rehabilitation for the exercise    Continue Psychosocial Services  No Follow up required             Education:  Education Goals: Education classes will be provided on a weekly basis, covering required topics. Participant will state understanding/return demonstration of topics presented.  Learning Barriers/Preferences:  Learning Barriers/Preferences - 08/15/22 0920       Learning Barriers/Preferences   Learning Barriers Sight   wears glasses   Learning Preferences Group Instruction;Individual Instruction;Skilled Demonstration             Education Topics: Know Your Numbers Group instruction that is supported by a PowerPoint presentation. Instructor discusses importance of knowing and understanding resting, exercise, and post-exercise oxygen saturation, heart rate, and blood pressure. Oxygen saturation, heart rate, blood pressure, rating of perceived exertion, and dyspnea are reviewed along with a normal range for these values.  Flowsheet Row PULMONARY REHAB OTHER RESPIRATORY from 09/06/2022 in [facility]  Date 09/06/22  Educator EP  Instruction Review Code 1- Verbalizes Understanding       Exercise for the Pulmonary Patient Group instruction that is supported by a PowerPoint presentation.  Instructor discusses benefits of exercise, core components of exercise, frequency, duration, and intensity of an exercise routine, importance of utilizing pulse oximetry during exercise, safety while exercising, and options of places to exercise outside of rehab.  Flowsheet Row PULMONARY REHAB OTHER RESPIRATORY from 08/23/2022 in [facility], [facility], [facility]  Date 08/23/22  Educator EP  Instruction Review Code 1- Verbalizes Understanding       MET Level  Group instruction provided by PowerPoint, verbal discussion, and written material to support subject matter. Instructor reviews what METs are and how to increase METs.  Flowsheet Row PULMONARY REHAB OTHER RESPIRATORY from 10/25/2022 in [facility], [facility]ascular, [facility]  Date 10/25/22  Educator EP  Instruction Review Code 1- Verbalizes Understanding       Pulmonary Medications Verbally interactive group education provided by instructor with focus on inhaled medications and proper administration. Flowsheet Row PULMONARY REHAB OTHER RESPIRATORY from 11/15/2022 in [facility] for Heart, Vascular, & Lung Health  Date 11/15/22  Educator RT  Instruction Review Code 1- Verbalizes Understanding       Anatomy and Physiology of the Respiratory System Group instruction provided by PowerPoint, verbal discussion, and written material to support subject matter. Instructor reviews respiratory cycle and anatomical components of the respiratory system and their functions. Instructor also reviews differences in obstructive and restrictive respiratory diseases with examples of each.  Flowsheet Row PULMONARY REHAB OTHER RESPIRATORY from 11/08/2022 in [facility] for [facility], Vascular, [facility] Lung Health  Date 11/08/22  Educator RT  Instruction Review Code 1- Verbalizes Understanding       Oxygen Safety Group instruction provided by  PowerPoint, verbal discussion, and written material to support subject matter. There is an overview of "What is Oxygen" and "Why do we need it".  Instructor also reviews how to create a safe environment for oxygen use, the importance of using oxygen as prescribed, and the risks of noncompliance. There is a brief discussion on traveling with oxygen and resources the patient may utilize. Flowsheet Row PULMONARY REHAB OTHER RESPIRATORY from 09/13/2022 in [facility] for Heart, Vascular, & Lung Health  Date 09/13/22  Educator RN  Instruction Review Code 1- Verbalizes Understanding       Oxygen Use Group instruction provided by PowerPoint, verbal discussion, and written material to discuss how supplemental oxygen is prescribed and different types of oxygen supply  systems. Resources for more information are provided.  Flowsheet Row PULMONARY REHAB OTHER RESPIRATORY from 09/20/2022 in [facility] for Heart, Vascular, & Lung Health  Date 09/20/22  Educator RT  Instruction Review Code 1- Verbalizes Understanding       Breathing Techniques Group instruction that is supported by demonstration and informational handouts. Instructor discusses the benefits of pursed lip and diaphragmatic breathing and detailed demonstration on how to perform both.     Risk Factor Reduction Group instruction that is supported by a PowerPoint presentation. Instructor discusses the definition of a risk factor, different risk factors for pulmonary disease, and how the heart and lungs work together. Flowsheet Row PULMONARY REHAB OTHER RESPIRATORY from 10/18/2022 in [facility] for Heart, Vascular, & Lung Health  Date 10/18/22  Educator EP  Instruction Review Code 1- Verbalizes Understanding       Pulmonary Diseases Group instruction provided by PowerPoint, verbal discussion, and written material to support subject matter. Instructor gives an overview  of the different type of pulmonary diseases. There is also a discussion on risk factors and symptoms as well as ways to manage the diseases.   Stress and Energy Conservation Group instruction provided by PowerPoint, verbal discussion, and written material to support subject matter. Instructor gives an overview of stress and the impact it can have on the body. Instructor also reviews ways to reduce stress. There is also a discussion on energy conservation and ways to conserve energy throughout the day.   Warning Signs and Symptoms Group instruction provided by PowerPoint, verbal discussion, and written material to support subject matter. Instructor reviews warning signs and symptoms of stroke, heart attack, cold and flu. Instructor also reviews ways to prevent the spread of infection.   Other Education Group or individual verbal, written, or video instructions that support the educational goals of the pulmonary rehab program. Flowsheet Row PULMONARY REHAB OTHER RESPIRATORY from 11/01/2022 in [facility] for Heart, Vascular, & Lung Health  Date 11/01/22  Educator RT  Instruction Review Code 1- Verbalizes Understanding        Knowledge Questionnaire Score:  Knowledge Questionnaire Score - 08/15/22 0929       Knowledge Questionnaire Score   Pre Score 18/18             Core Components/Risk Factors/Patient Goals at Admission:  Personal Goals and Risk Factors at Admission - 08/15/22 0920       Core Components/Risk Factors/Patient Goals on Admission    Weight Management Yes;Weight Loss    Intervention Weight Management: Develop a combined nutrition and exercise program designed to reach desired caloric intake, while maintaining appropriate intake of nutrient and fiber, sodium and fats, and appropriate energy expenditure required for the weight goal.;Weight Management: Provide education and appropriate resources to help participant work on and attain dietary  goals.;Weight Management/Obesity: Establish reasonable short term and long term weight goals.;Obesity: Provide education and appropriate resources to help participant work on and attain dietary goals.    Expected Outcomes Short Term: Continue to assess and modify interventions until short term weight is achieved;Long Term: Adherence to nutrition and physical activity/exercise program aimed toward attainment of established weight goal;Weight Maintenance: Understanding of the daily nutrition guidelines, which includes 25-35% calories from fat, 7% or less cal from saturated fats, less than 200mg  cholesterol, less than 1.5gm of sodium, & 5 or more servings of fruits and vegetables daily;Weight Loss: Understanding of general recommendations for a balanced deficit meal plan, which promotes  1-2 lb weight loss per week and includes a negative energy balance of [phone] kcal/d;Understanding recommendations for meals to include 15-35% energy as protein, 25-35% energy from fat, 35-60% energy from carbohydrates, less than 200mg  of dietary cholesterol, 20-35 gm of total fiber daily;Understanding of distribution of calorie intake throughout the day with the consumption of 4-5 meals/snacks    Improve shortness of breath with ADL's Yes    Intervention Provide education, individualized exercise plan and daily activity instruction to help decrease symptoms of SOB with activities of daily living.    Expected Outcomes Short Term: Improve cardiorespiratory fitness to achieve a reduction of symptoms when performing ADLs;Long Term: Be able to perform more ADLs without symptoms or delay the onset of symptoms             Core Components/Risk Factors/Patient Goals Review:   Goals and Risk Factor Review     Row Name 09/03/22 4098 09/28/22 0913 10/22/22 1512 11/19/22 0915       Core Components/Risk Factors/Patient Goals Review   Personal Goals Review Weight Management/Obesity;Improve shortness of breath with ADL's Weight  Management/Obesity;Improve shortness of breath with ADL's Weight Management/Obesity;Improve shortness of breath with ADL's Weight Management/Obesity;Improve shortness of breath with ADL's    Review Hannah Klein has recently started the program. She is working with staff dietician to achieve her weight loss goals. She is able to demonstrate purse lip breathing when she gets short of breath. She still gets short of breath when performing her ADL's. Hannah Klein is enjoying the program so far. We will continue to monitor her progress. Goal progressing for weight loss. Hannah Klein has been working with our dietician for weight loss goals. Goal progressing on improving her shortness of breath with ADLs. Hannah Klein is being worked up for lung transplant and remains optimistic through this process. We will continue to monitor her progress throughout the program. Goal in progress to lose weight and make healthy choices. Hannah Klein is working with our dietician on healthy eating habits and less snacking. Per [facility] lung transplant team recommendations, Hannah Klein will need to lose weight before surgery. Our dietician has recently suggested a low calorie 14-day plan. Hannah Klein was excited to see that she lost weight last week. Hopefully this will jump start her weight loss journey and give her the confidence that she can lose weight. Goal in progress on improving her shortness of breath with ADLs. Hannah Klein is maintaining her oxygen saturation of 88% or higher on 8-10L with exertion. She is able to titrate down to 2L at rest. Hannah Klein will continue to benefit from PR for nutrition, education, exercise, and lifestyle modification. Goal in progress to lose weight. Hannah Klein is working with our dietician on healthy eating habits and less snacking. Per [facility] lung transplant team recommendations, Hannah Klein will need to lose weight before surgery. She is currently down ~8#. Goal in progress on improving her shortness of breath with ADLs. Hannah Klein is maintaining her  oxygen saturation of 88% or higher on 8-10L with exertion. She has increased both his workload and METs while maintain on her current oxygen prescription. Hannah Klein will continue to benefit from PR for nutrition, education, exercise, and lifestyle modification.    Expected Outcomes See admission goals. See admission goals. For [name] to lose weight and improve her shortness of breath with ADLs For [name] to lose weight and improve her shortness of breath with ADLs             Core Components/Risk Factors/Patient Goals at Discharge (Final Review):   Goals and Risk  Factor Review - 11/19/22 0915       Core Components/Risk Factors/Patient Goals Review   Personal Goals Review Weight Management/Obesity;Improve shortness of breath with ADL's    Review Goal in progress to lose weight. Hannah Klein is working with our dietician on healthy eating habits and less snacking. Per [facility] lung transplant team recommendations, Hannah Klein will need to lose weight before surgery. She is currently down ~8#. Goal in progress on improving her shortness of breath with ADLs. Hannah Klein is maintaining her oxygen saturation of 88% or higher on 8-10L with exertion. She has increased both his workload and METs while maintain on her current oxygen prescription. Hannah Klein will continue to benefit from PR for nutrition, education, exercise, and lifestyle modification.    Expected Outcomes For [name] to lose weight and improve her shortness of breath with ADLs             ITP Comments: Pt is making expected progress toward Pulmonary Rehab goals after completing 21 sessions. Recommend continued exercise, life style modification, education, and utilization of breathing techniques to increase stamina and strength, while also decreasing shortness of breath with exertion.  Dr. Mechele Collin is Medical Director for Pulmonary Rehab at [facility].

## 2022-11-29 ENCOUNTER — Encounter (HOSPITAL_COMMUNITY)
Admission: RE | Admit: 2022-11-29 | Discharge: 2022-11-29 | Disposition: A | Discharge status: Home / Self Care | Payer: Commercial Managed Care - HMO | Source: Ambulatory Visit | Attending: Pulmonary Disease | Admitting: Pulmonary Disease

## 2022-11-29 DIAGNOSIS — J45909 Unspecified asthma, uncomplicated: Secondary | ICD-10-CM | POA: Diagnosis not present

## 2022-11-29 DIAGNOSIS — Z87891 Personal history of nicotine dependence: Secondary | ICD-10-CM | POA: Diagnosis not present

## 2022-11-29 DIAGNOSIS — Z79899 Other long term (current) drug therapy: Secondary | ICD-10-CM | POA: Insufficient documentation

## 2022-11-29 DIAGNOSIS — J849 Interstitial pulmonary disease, unspecified: Secondary | ICD-10-CM | POA: Diagnosis present

## 2022-11-29 NOTE — Progress Notes (Signed)
Daily Session Note  Patient Details  Name: Hannah Klein MRN: [id number] Date of Birth: 1976-01-18 Referring Provider:   Doristine Devoid Pulmonary Rehab Walk Test from 08/15/2022 in [facility]  Referring Provider Soskis  [sees [name]]       Encounter Date: 11/29/2022  Check In:  Session Check In - 11/29/22 0850       Check-In   Supervising physician immediately available to respond to emergencies [facility] MD immediately available    Physician(s) Elizabeth Sauer, NP    Location [facility]    Staff Present Elissa Lovett BS, ACSM-CEP, Exercise Physiologist;Kaylee Earlene Plater, MS, ACSM-CEP, Exercise Physiologist;Elayah Klooster Synthia Innocent, RN, BSN    Virtual Visit No    Medication changes reported     No    Fall or balance concerns reported    No    Tobacco Cessation No Change    Warm-up and Cool-down Performed as group-led instruction    Resistance Training Performed Yes    VAD Patient? No    PAD/SET Patient? No      Pain Assessment   Currently in Pain? No/denies             Capillary Blood Glucose: No results found for this or any previous visit (from the past 24 hour(s)).    Social History   Tobacco Use  Smoking Status Former   Current packs/day: 0.00   Average packs/day: 2.0 packs/day for 18.0 years (36.1 ttl pk-yrs)   Types: Cigarettes   Start date: 54   Quit date: 03/16/2007   Years since quitting: 15.7   Passive exposure: Current  Smokeless Tobacco Never    Goals Met:  Proper associated with RPD/PD & O2 Sat Independence with exercise equipment Exercise tolerated well No report of concerns or symptoms today Strength training completed today  Goals Unmet:  Not Applicable  Comments: Service time is from 0812 to 0940.    Dr. Mechele Collin is Medical Director for Pulmonary Rehab at [facility].

## 2022-12-04 ENCOUNTER — Encounter (HOSPITAL_COMMUNITY)
Admission: RE | Admit: 2022-12-04 | Discharge: 2022-12-04 | Disposition: A | Discharge status: Home / Self Care | Payer: Commercial Managed Care - HMO | Source: Ambulatory Visit | Attending: Pulmonary Disease

## 2022-12-04 DIAGNOSIS — J849 Interstitial pulmonary disease, unspecified: Secondary | ICD-10-CM

## 2022-12-04 NOTE — Progress Notes (Deleted)
Office Visit Note  [name]             Date of Birth: [birth year]           MRN: [id number]             PCP: Doreene Nest, NP Referring: Doreene Nest, NP Visit Date: 12/13/2022   Subjective:  No chief complaint on file.   History of Present Illness: NIMCO Hannah Klein is a 47 y.o. female here for follow up for Sjogren syndrome with CTD associated ILD now on cyclophosphamide infusions tapering mycophenolate and prednisone is currently at 15 mg daily.    Previous HPI 09/12/2022 Hannah Klein is a 47 y.o. female here for follow up for Sjogren syndrome with CTD associated ILD now on cyclophosphamide infusions tapering mycophenolate and prednisone is currently at 15 mg daily.  She has had 2 infusions so far without any apparent severe reaction.  She had increase in burning and stinging type pain affecting both hands concern for neuropathy and seem to be getting worse when taking the mycophenolate which she is now tapering.  So far the symptom remains without much difference.  Also having more trouble with eye irritation especially painful first thing in the morning.  Eyes and mouth remain feeling extremely dry.  Notices more prominent livedo reticularis type discoloration on her legs no associated skin lesions and is not painful.      Previous HPI 06/13/22 Hannah Klein is a 47 y.o. female here for evaluation of suspected Sjogren syndrome or other CTD associated with ILD.  She has a longstanding history of persistent asthma she has been on multiple therapies but developed worsening shortness of breath issues since around November last year.  She had started on tezspire asthma treatment for 4 months prior to this due to active symptoms.  Worsening dyspnea prompted evaluation at the emergency department due to a high D-dimer and she had CTA imaging which was negative for blood clots but demonstrated groundglass opacities and is was admitted in December.   She was treated with antibiotics and prednisone taper without significant resolution of symptoms.  She had surgical lung biopsy by Dr. Cliffton Asters in January with pathology showing nonspecific inflammation and pulmonary fibrosis.  Initial steroid course in January and then resumed again at 30 mg dose in March.  Symptoms are partially improved still having coughing and a lot of complaints about chest pain and sometimes left side neck pain.  She had previous ENT evaluation for cough which was attributed to irritation either upper airway cough syndrome or laryngal esophageal reflux.  She has a positive rheumatoid factor and antibody workup with Dr. 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  She started on mycophenolate and now increased to taking 1000 mg twice daily since last week.  So far tolerating this medication fine. She has longstanding dry eyes and mouth symptoms.  Had attributed this to her allergies and medications more in the past and just treated supportively.  Has noticed face and forearm rashes and sun sensitivity that are more of a new or increased problem recently.  She has noticed fingernail changes with increased brittleness no visible discoloration or pitting.  Has joint pains and stiffness in multiple areas not associated with swelling visible erythema or warmth.  Recently she is noticing swelling in her face and hands and unintentional weight gain.  Also more jittery movements and positional tremor.  These have been more pronounced since starting on  the prednisone. Does not notice salivary gland or lymph node swelling.  No Raynaud's symptoms.   Labs reviewed 03/2022 ANA 1:1280 cytoplasmic SSA >8.0 RNP 1.2 MyoMarker SSA 52 kDa >200 RF 25 CCP neg CK 104 Hepatitis panel neg HIV neg   No Rheumatology ROS completed.   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   Class 1 obesity due to excess calories with serious comorbidity and body mass index (BMI) of 34.0 to 34.9 in adult 08/14/2022   Sjogren syndrome with lung involvement (HCC) 06/13/2022   High risk medication use 06/13/2022   ILD (interstitial lung disease) (HCC) 03/08/2022   S/P robot-assisted surgical procedure 03/08/2022   Pneumonitis 01/26/2022   Perennial allergic rhinitis 09/21/2021   Neck pain 08/31/2021   Allergic rhinitis due to pollen 08/31/2021   Moderate persistent asthma, uncomplicated 08/04/2021   Vitamin D deficiency 06/09/2021   BPV (benign positional vertigo), right 04/28/2021   Right tennis elbow 04/28/2021   Upper airway cough syndrome 09/28/2020   Pulmonary nodule 04/29/2020   Dyspnea 04/04/2020   Chronic rhinitis 04/04/2020   OSA (obstructive sleep apnea) 04/04/2020   Essential hypertension 11/11/2017   Hyperlipidemia 11/29/2016   Preventative health care 11/23/2015   Constipation 11/23/2015   Generalized anxiety disorder 04/26/2015   Right-sided low back pain with right-sided sciatica 04/26/2015   Asthma 04/26/2015   Frequent headaches 04/26/2015   Osteoarthritis 04/26/2015   GERD (gastroesophageal reflux disease) 04/26/2015    Past Medical History:  Diagnosis Date   Arthritis    Asthma    Environmental allergies    Esophageal dysphagia    Generalized anxiety disorder    GERD (gastroesophageal reflux disease)    History of hiatal hernia    Hx of colonic polyps    Hypertension    Lung disorder    Patient states she has two unidentified ILD   Migraines    MRSA (methicillin resistant Staphylococcus aureus)    Lt upper arm  approx 8 years ago   Sleep apnea     Family History  Problem Relation Age of Onset   Eczema Mother    Allergic rhinitis Brother    Asthma Brother    Asthma Brother    Allergic rhinitis Brother    Urticaria Other    Angioedema Other    Breast cancer Neg Hx    Past Surgical History:  Procedure  Laterality Date   CHOLECYSTECTOMY     DILATION AND CURETTAGE OF UTERUS     INTERCOSTAL NERVE BLOCK Right 03/08/2022   Procedure: INTERCOSTAL NERVE BLOCK;  Surgeon: Corliss Skains, MD;  Location: [facility];  Service: Thoracic;  Laterality: Right;   LUNG BIOPSY  02/2022   NECK SURGERY     Fusion to C6 and C7   TYMPANOSTOMY TUBE PLACEMENT     WRIST ARTHROSCOPY WITH DEBRIDEMENT Left 02/27/2018   Procedure: WRIST ARTHROSCOPY WITH DEBRIDEMENT;  Surgeon: Betha Loa, MD;  Location: [facility];  Service: Orthopedics;  Laterality: Left;   Social History   Social History Narrative   Single.   Moved from [place].   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 Filing for disability   Immunization History  Administered Date(s) Administered   Hep B, Unspecified 08/20/2022   Hepb-cpg 08/16/2022, 09/19/2022   Influenza-Unspecified 05/26/2017   Tdap 01/09/2018     Objective: Vital  Signs: There were no vitals taken for this visit.   Physical Exam   Musculoskeletal Exam: ***  CDAI Exam: CDAI Score: -- Patient Global: --; Provider Global: -- Swollen: --; Tender: -- Joint Exam 12/13/2022   No joint exam has been documented for this visit   There is currently no information documented on the homunculus. Go to the Rheumatology activity and complete the homunculus joint exam.  Investigation: No additional findings.  Imaging: No results found.  Recent Labs: Lab Results  Component Value Date   WBC 5.0 11/05/2022   HGB 11.7 (L) 11/05/2022   PLT 201.0 11/05/2022   NA 139 10/25/2022   K 3.6 10/25/2022   CL 103 10/25/2022   CO2 28 10/25/2022   GLUCOSE 84 10/25/2022   BUN 17 10/25/2022   CREATININE 0.94 10/25/2022   BILITOT 0.9 10/25/2022   ALKPHOS 38 (L) 10/25/2022   AST 24 10/25/2022   ALT 19 10/25/2022   PROT 7.3 10/25/2022   ALBUMIN 4.7 10/25/2022   CALCIUM 9.7 10/25/2022   GFRAA >60 02/20/2018   QFTBGOLDPLUS INDETERMINATE (A) 05/17/2022     Speciality Comments: No specialty comments available.  Procedures:  No procedures performed Allergies: Advair diskus [fluticasone-salmeterol], Flonase [fluticasone], Sulfamethoxazole-trimethoprim, and Lipitor [atorvastatin calcium]   Assessment / Plan:     Visit Diagnoses: No diagnosis found.  ***  Orders: No orders of the defined types were placed in this encounter.  No orders of the defined types were placed in this encounter.    Follow-Up Instructions: No follow-ups on file.   Metta Clines, RT  Note - This record has been created using [employer].  Chart creation errors have been sought, but may not always  have been located. Such creation errors do not reflect on  the standard of medical care.

## 2022-12-04 NOTE — Progress Notes (Signed)
Daily Session Note  Patient Details  Name: Hannah Klein MRN: [id number] Date of Birth: 01/15/1976 Referring Provider:   Doristine Devoid Pulmonary Rehab Walk Test from 08/15/2022 in [facility] for Heart, Vascular, & Lung Health  Referring Provider Soskis  [sees [name]]       Encounter Date: 12/04/2022  Check In:  Session Check In - 12/04/22 0825       Check-In   Supervising physician immediately available to respond to emergencies [facility] MD immediately available    Physician(s) Joni Reining, NP    Location [facility]    Staff Present Elissa Lovett BS, ACSM-CEP, Exercise Physiologist;Kaylee Earlene Plater, MS, ACSM-CEP, Exercise Physiologist;Casey Katrinka Blazing, RT    Virtual Visit No    Medication changes reported     No    Fall or balance concerns reported    No    Tobacco Cessation No Change    Warm-up and Cool-down Performed as group-led instruction    Resistance Training Performed Yes    VAD Patient? No    PAD/SET Patient? No      Pain Assessment   Currently in Pain? No/denies    Pain Score 0-No pain    Multiple Pain Sites No             Capillary Blood Glucose: No results found for this or any previous visit (from the past 24 hour(s)).    Social History   Tobacco Use  Smoking Status Former   Current packs/day: 0.00   Average packs/day: 2.0 packs/day for 18.0 years (36.1 ttl pk-yrs)   Types: Cigarettes   Start date: 7   Quit date: 03/16/2007   Years since quitting: 15.7   Passive exposure: Current  Smokeless Tobacco Never    Goals Met:  Independence with exercise equipment Improved SOB with ADL's Exercise tolerated well No report of concerns or symptoms today Strength training completed today  Goals Unmet:  Not Applicable  Comments: Service time is from 0809 to 0910.    Dr. Mechele Collin is Medical Director for Pulmonary Rehab at [facility].

## 2022-12-05 ENCOUNTER — Ambulatory Visit: Payer: Commercial Managed Care - HMO | Admitting: Primary Care

## 2022-12-06 ENCOUNTER — Encounter: Payer: Self-pay | Admitting: Pulmonary Disease

## 2022-12-06 ENCOUNTER — Encounter (HOSPITAL_COMMUNITY)
Admission: RE | Admit: 2022-12-06 | Discharge: 2022-12-06 | Disposition: A | Discharge status: Home / Self Care | Payer: Commercial Managed Care - HMO | Source: Ambulatory Visit | Attending: Pulmonary Disease | Admitting: Pulmonary Disease

## 2022-12-06 DIAGNOSIS — J849 Interstitial pulmonary disease, unspecified: Secondary | ICD-10-CM

## 2022-12-06 NOTE — Progress Notes (Signed)
Daily Session Note  Patient Details  Name: Hannah Klein MRN: [id number] Date of Birth: October 02, 1975 Referring Provider:   Doristine Devoid Pulmonary Rehab Walk Test from 08/15/2022 in [facility] for Heart, Vascular, & Lung Health  Referring Provider Soskis  [sees [name]]       Encounter Date: 12/06/2022  Check In:  Session Check In - 12/06/22 4782       Check-In   Supervising physician immediately available to respond to emergencies [facility] MD immediately available    Physician(s) Jari Favre, NP    [facility]    Staff Present Elissa Lovett BS, ACSM-CEP, Exercise Physiologist;Luc Shammas Earlene Plater, MS, ACSM-CEP, Exercise Physiologist;Casey Synthia Innocent, RN, BSN    Virtual Visit No    Medication changes reported     No    Fall or balance concerns reported    No    Tobacco Cessation No Change    Warm-up and Cool-down Performed as group-led instruction    Resistance Training Performed Yes    VAD Patient? No    PAD/SET Patient? No      Pain Assessment   Currently in Pain? No/denies    Pain Score 0-No pain    Multiple Pain Sites No             Capillary Blood Glucose: No results found for this or any previous visit (from the past 24 hour(s)).    Social History   Tobacco Use  Smoking Status Former   Current packs/day: 0.00   Average packs/day: 2.0 packs/day for 18.0 years (36.1 ttl pk-yrs)   Types: Cigarettes   Start date: 18   Quit date: 03/16/2007   Years since quitting: 15.7   Passive exposure: Current  Smokeless Tobacco Never    Goals Met:  Proper associated with RPD/PD & O2 Sat Independence with exercise equipment Exercise tolerated well No report of concerns or symptoms today Strength training completed today  Goals Unmet:  Not Applicable  Comments: Service time is from 0817 to 0925.    Dr. Mechele Collin is Medical Director for Pulmonary Rehab at [facility]

## 2022-12-11 ENCOUNTER — Encounter (HOSPITAL_COMMUNITY)
Admission: RE | Admit: 2022-12-11 | Discharge: 2022-12-11 | Disposition: A | Discharge status: Home / Self Care | Payer: Commercial Managed Care - HMO | Source: Ambulatory Visit | Attending: Pulmonary Disease

## 2022-12-11 VITALS — Wt 226.2 lb

## 2022-12-11 DIAGNOSIS — J849 Interstitial pulmonary disease, unspecified: Secondary | ICD-10-CM | POA: Diagnosis not present

## 2022-12-11 NOTE — Progress Notes (Addendum)
Daily Session Note  Patient Details  Name: Hannah Klein MRN: [id number] Date of Birth: 12-07-1975 Referring Provider:   Doristine Devoid Pulmonary Rehab Walk Test from 08/15/2022 in [facility] for Heart, Vascular, & Lung Health  Referring Provider Soskis  [sees [name]]       Encounter Date: 12/11/2022  Check In:  Session Check In - 12/11/22 0824       Check-In   Supervising physician immediately available to respond to emergencies [facility] MD immediately available    Physician(s) Jari Favre, NP    [facility]    Staff Present Elissa Lovett BS, ACSM-CEP, Exercise Physiologist;Casey Synthia Innocent, RN, BSN    Virtual Visit No    Medication changes reported     No    Fall or balance concerns reported    No    Tobacco Cessation No Change    Warm-up and Cool-down Performed as group-led instruction    Resistance Training Performed Yes    VAD Patient? No    PAD/SET Patient? No      Pain Assessment   Currently in Pain? No/denies    Multiple Pain Sites No             Capillary Blood Glucose: No results found for this or any previous visit (from the past 24 hour(s)).   Exercise Prescription Changes - 12/11/22 0900       Response to Exercise   Blood Pressure (Admit) 128/68    Blood Pressure (Exercise) 146/64    Blood Pressure (Exit) 118/68    Heart Rate (Admit) 79 bpm    Heart Rate (Exercise) 97 bpm    Heart Rate (Exit) 74 bpm    Oxygen Saturation (Admit) 98 %   4L   Oxygen Saturation (Exercise) 94 %   10L   Oxygen Saturation (Exit) 95 %   3L   Rating of Perceived Exertion (Exercise) 13    Perceived Dyspnea (Exercise) 1    Duration Continue with 30 min of aerobic exercise without signs/symptoms of physical distress.    Intensity THRR unchanged      Progression   Progression Continue to progress workloads to maintain intensity without signs/symptoms of physical distress.      Resistance Training   Training  Prescription Yes    Weight blue bands    Reps 10-15    Time 10 Minutes      Interval Training   Interval Training No      Oxygen   Oxygen Continuous    Liters 3-10L      Treadmill   MPH 2.5    Grade 2    Minutes 15    METs 3.6      Recumbant Elliptical   Level 4    Minutes 15    METs 3      Oxygen   Maintain Oxygen Saturation 88% or higher             Social History   Tobacco Use  Smoking Status Former   Current packs/day: 0.00   Average packs/day: 2.0 packs/day for 18.0 years (36.1 ttl pk-yrs)   Types: Cigarettes   Start date: 11   Quit date: 03/16/2007   Years since quitting: 15.7   Passive exposure: Current  Smokeless Tobacco Never    Goals Met:  Independence with exercise equipment Exercise tolerated well No report of concerns or symptoms today Strength training completed today  Goals Unmet:  Not Applicable  Comments:  Service time is from 0806 to 0911   Dr. Mechele Collin is Medical Director for Pulmonary Rehab at [facility].

## 2022-12-13 ENCOUNTER — Ambulatory Visit: Payer: Commercial Managed Care - HMO | Admitting: Internal Medicine

## 2022-12-13 ENCOUNTER — Encounter (HOSPITAL_COMMUNITY)
Admission: RE | Admit: 2022-12-13 | Discharge: 2022-12-13 | Disposition: A | Discharge status: Home / Self Care | Payer: Commercial Managed Care - HMO | Source: Ambulatory Visit | Attending: Pulmonary Disease | Admitting: Pulmonary Disease

## 2022-12-13 DIAGNOSIS — R231 Pallor: Secondary | ICD-10-CM

## 2022-12-13 DIAGNOSIS — H0289 Other specified disorders of eyelid: Secondary | ICD-10-CM

## 2022-12-13 DIAGNOSIS — J849 Interstitial pulmonary disease, unspecified: Secondary | ICD-10-CM | POA: Diagnosis not present

## 2022-12-13 DIAGNOSIS — G622 Polyneuropathy due to other toxic agents: Secondary | ICD-10-CM

## 2022-12-13 DIAGNOSIS — Z7952 Long term (current) use of systemic steroids: Secondary | ICD-10-CM

## 2022-12-13 DIAGNOSIS — M3502 Sicca syndrome with lung involvement: Secondary | ICD-10-CM

## 2022-12-13 DIAGNOSIS — Z79899 Other long term (current) drug therapy: Secondary | ICD-10-CM

## 2022-12-13 NOTE — Progress Notes (Signed)
Daily Session Note  Patient Details  Name: Hannah Klein MRN: [id number] Date of Birth: 01/10/1976 Referring Provider:   Doristine Devoid Pulmonary Rehab Walk Test from 08/15/2022 in [facility] for Heart, Vascular, & Lung Health  Referring Provider Soskis  [sees [name]]       Encounter Date: 12/13/2022  Check In:  Session Check In - 12/13/22 0831       Check-In   Supervising physician immediately available to respond to emergencies [facility] MD immediately available    Physician(s) Jari Favre, NP    [facility]    Staff Present Elissa Lovett BS, ACSM-CEP, Exercise Physiologist;Casey Synthia Innocent, RN, BSN;Samantha Belarus, RD, Rexene Agent, MS, ACSM-CEP, Exercise Physiologist    Virtual Visit No    Medication changes reported     No    Fall or balance concerns reported    No    Tobacco Cessation No Change    Warm-up and Cool-down Performed as group-led instruction    Resistance Training Performed Yes    VAD Patient? No    PAD/SET Patient? No      Pain Assessment   Currently in Pain? No/denies    Multiple Pain Sites No             Capillary Blood Glucose: No results found for this or any previous visit (from the past 24 hour(s)).    Social History   Tobacco Use  Smoking Status Former   Current packs/day: 0.00   Average packs/day: 2.0 packs/day for 18.0 years (36.1 ttl pk-yrs)   Types: Cigarettes   Start date: 89   Quit date: 03/16/2007   Years since quitting: 15.7   Passive exposure: Current  Smokeless Tobacco Never    Goals Met:  Independence with exercise equipment Exercise tolerated well No report of concerns or symptoms today Strength training completed today  Goals Unmet:  Not Applicable  Comments: Service time is from 0810 to 0926.  Dr. Mechele Collin is Medical Director for Pulmonary Rehab at [facility].

## 2022-12-18 ENCOUNTER — Encounter (HOSPITAL_COMMUNITY)
Admission: RE | Admit: 2022-12-18 | Discharge: 2022-12-18 | Disposition: A | Discharge status: Home / Self Care | Payer: Commercial Managed Care - HMO | Source: Ambulatory Visit | Attending: Pulmonary Disease

## 2022-12-18 ENCOUNTER — Other Ambulatory Visit: Payer: Self-pay | Admitting: Pulmonary Disease

## 2022-12-18 DIAGNOSIS — J849 Interstitial pulmonary disease, unspecified: Secondary | ICD-10-CM | POA: Diagnosis not present

## 2022-12-18 NOTE — Progress Notes (Signed)
Daily Session Note  Patient Details  Name: Hannah Klein MRN: [id number] Date of Birth: Apr 12, 1975 Referring Provider:   Doristine Devoid Pulmonary Rehab Walk Test from 08/15/2022 in [facility] for Heart, Vascular, & Lung Health  Referring Provider Soskis  [sees [name]]       Encounter Date: 12/18/2022  Check In:  Session Check In - 12/18/22 2130       Check-In   Supervising physician immediately available to respond to emergencies [facility] MD immediately available    Physician(s) Edd Fabian, NP    [facility]    Staff Present Elissa Lovett BS, ACSM-CEP, Exercise Physiologist;Tonyia Marschall Synthia Innocent, RN, Doris Cheadle, MS, ACSM-CEP, Exercise Physiologist    Virtual Visit No    Medication changes reported     No    Fall or balance concerns reported    No    Tobacco Cessation No Change    Warm-up and Cool-down Performed as group-led instruction    Resistance Training Performed Yes    VAD Patient? No    PAD/SET Patient? No      Pain Assessment   Currently in Pain? No/denies    Multiple Pain Sites No             Capillary Blood Glucose: No results found for this or any previous visit (from the past 24 hour(s)).    Social History   Tobacco Use  Smoking Status Former   Current packs/day: 0.00   Average packs/day: 2.0 packs/day for 18.0 years (36.1 ttl pk-yrs)   Types: Cigarettes   Start date: 41   Quit date: 03/16/2007   Years since quitting: 15.7   Passive exposure: Current  Smokeless Tobacco Never    Goals Met:  Proper associated with RPD/PD & O2 Sat Independence with exercise equipment Exercise tolerated well No report of concerns or symptoms today Strength training completed today  Goals Unmet:  Not Applicable  Comments: Service time is from 0816 to 0935.

## 2022-12-20 ENCOUNTER — Encounter: Payer: Self-pay | Admitting: Pulmonary Disease

## 2022-12-20 ENCOUNTER — Encounter (HOSPITAL_COMMUNITY)
Admission: RE | Admit: 2022-12-20 | Discharge: 2022-12-20 | Disposition: A | Discharge status: Home / Self Care | Payer: Commercial Managed Care - HMO | Source: Ambulatory Visit | Attending: Pulmonary Disease | Admitting: Pulmonary Disease

## 2022-12-20 DIAGNOSIS — J849 Interstitial pulmonary disease, unspecified: Secondary | ICD-10-CM | POA: Diagnosis not present

## 2022-12-20 NOTE — Telephone Encounter (Signed)
I have the signed form in envelope in my office  Need to know if she is coming to pick up, or if she wants [name] to mail this  [facility]

## 2022-12-20 NOTE — Telephone Encounter (Signed)
I have the signed form in envelope in my office  Pickup or mail to pt?   LMTCB

## 2022-12-20 NOTE — Progress Notes (Signed)
Daily Session Note  Patient Details  Name: Hannah Klein MRN: [id number] Date of Birth: March 02, 1975 Referring Provider:   Doristine Devoid Pulmonary Rehab Walk Test from 08/15/2022 in [facility] for Heart, Vascular, & Lung Health  Referring Provider Soskis  [sees [name]]       Encounter Date: 12/20/2022  Check In:  Session Check In - 12/20/22 4098       Check-In   Supervising physician immediately available to respond to emergencies [facility] MD immediately available    Physician(s) Eligha Bridegroom, NP    Location [facility]    Staff Present Elissa Lovett BS, ACSM-CEP, Exercise Physiologist;Markez Dowland Synthia Innocent, RN, Doris Cheadle, MS, ACSM-CEP, Exercise Physiologist    Virtual Visit No    Medication changes reported     No    Fall or balance concerns reported    No    Tobacco Cessation No Change    Warm-up and Cool-down Performed as group-led instruction    Resistance Training Performed Yes    VAD Patient? No    PAD/SET Patient? No      Pain Assessment   Currently in Pain? No/denies    Multiple Pain Sites No             Capillary Blood Glucose: No results found for this or any previous visit (from the past 24 hour(s)).    Social History   Tobacco Use  Smoking Status Former   Current packs/day: 0.00   Average packs/day: 2.0 packs/day for 18.0 years (36.1 ttl pk-yrs)   Types: Cigarettes   Start date: 69   Quit date: 03/16/2007   Years since quitting: 15.7   Passive exposure: Current  Smokeless Tobacco Never    Goals Met:  Proper associated with RPD/PD & O2 Sat Independence with exercise equipment Exercise tolerated well No report of concerns or symptoms today Strength training completed today  Goals Unmet:  Not Applicable  Comments: Service time is from 0813 to 0946.  Dr. Mechele Collin is Medical Director for Pulmonary Rehab at [facility].

## 2022-12-21 MED ORDER — PREDNISONE 5 MG PO TABS: 5.0000 mg | ORAL_TABLET | Freq: Every day | ORAL | 1 refills | Status: AC

## 2022-12-25 ENCOUNTER — Encounter (HOSPITAL_COMMUNITY)
Admission: RE | Admit: 2022-12-25 | Discharge: 2022-12-25 | Disposition: A | Discharge status: Home / Self Care | Payer: Commercial Managed Care - HMO | Source: Ambulatory Visit | Attending: Pulmonary Disease

## 2022-12-25 VITALS — Wt 222.0 lb

## 2022-12-25 DIAGNOSIS — J849 Interstitial pulmonary disease, unspecified: Secondary | ICD-10-CM | POA: Diagnosis not present

## 2022-12-25 NOTE — Progress Notes (Signed)
Daily Session Note  Patient Details  Name: Hannah Klein MRN: [id number] Date of Birth: 1975-09-13 Referring Provider:   Doristine Devoid Pulmonary Rehab Walk Test from 08/15/2022 in [facility] for Heart, Vascular, & Lung Health  Referring Provider Soskis  [sees [name]]       Encounter Date: 12/25/2022  Check In:  Session Check In - 12/25/22 6295       Check-In   Supervising physician immediately available to respond to emergencies [facility] MD immediately available    Physician(s) Neila Gear, NP    Location [facility]    Staff Present Durel Salts, Patriciaann Clan, RN, Doris Cheadle, MS, ACSM-CEP, Exercise Physiologist    Virtual Visit No    Medication changes reported     No    Fall or balance concerns reported    No    Tobacco Cessation No Change    Warm-up and Cool-down Performed as group-led instruction    Resistance Training Performed Yes    VAD Patient? No    PAD/SET Patient? No      Pain Assessment   Currently in Pain? No/denies    Multiple Pain Sites No             Capillary Blood Glucose: No results found for this or any previous visit (from the past 24 hour(s)).   Exercise Prescription Changes - 12/25/22 0900       Response to Exercise   Blood Pressure (Admit) 122/68    Blood Pressure (Exercise) 134/76    Blood Pressure (Exit) 114/60    Heart Rate (Admit) 77 bpm    Heart Rate (Exercise) 108 bpm    Heart Rate (Exit) 79 bpm    Oxygen Saturation (Admit) 95 %    Oxygen Saturation (Exercise) 90 %    Oxygen Saturation (Exit) 91 %    Rating of Perceived Exertion (Exercise) 11    Perceived Dyspnea (Exercise) 1    Duration Continue with 30 min of aerobic exercise without signs/symptoms of physical distress.    Intensity THRR unchanged      Progression   Progression Continue to progress workloads to maintain intensity without signs/symptoms of physical distress.      Resistance Training   Training  Prescription Yes    Weight black bands    Reps 10-15    Time 10 Minutes      Interval Training   Interval Training Yes    Equipment Treadmill    Comments 1 min @ @ 2 mph + 0 incline      Oxygen   Oxygen Continuous    Liters 1-6      Treadmill   MPH 3    Grade 1    Minutes 15    METs 3.5      Recumbant Elliptical   Level 5    Minutes 15    METs 3.1      Oxygen   Maintain Oxygen Saturation 88% or higher             Social History   Tobacco Use  Smoking Status Former   Current packs/day: 0.00   Average packs/day: 2.0 packs/day for 18.0 years (36.1 ttl pk-yrs)   Types: Cigarettes   Start date: 26   Quit date: 03/16/2007   Years since quitting: 15.7   Passive exposure: Current  Smokeless Tobacco Never    Goals Met:  Proper associated with RPD/PD & O2 Sat Independence with exercise equipment Exercise  tolerated well No report of concerns or symptoms today Strength training completed today  Goals Unmet:  Not Applicable  Comments: Service time is from 0814 to [phone].   Dr. Mechele Collin is Medical Director for Pulmonary Rehab at [facility].

## 2022-12-26 NOTE — Progress Notes (Signed)
Pulmonary Individual Treatment Plan  Patient Details  Name: [name] DOWLEN MRN: [id number] Date of Birth: 09/01/1975 Referring Provider:   Doristine Devoid Pulmonary Rehab Walk Test from 08/15/2022 in [facility]  Referring Provider Soskis  [sees [name]]       Initial Encounter Date:  Flowsheet Row Pulmonary Rehab Walk Test from 08/15/2022 in [facility]  Date 08/15/22       Visit Diagnosis: Interstitial lung disease (HCC)  Patient's Home Medications on Admission:   Current Outpatient Medications:    acetaminophen (TYLENOL) 500 MG tablet, Take 2 tablets (1,000 mg total) by mouth every 6 (six) hours as needed., Disp: 30 tablet, Rfl: 0   albuterol (VENTOLIN HFA) 108 (90 Base) MCG/ACT inhaler, INHALE 1-2 PUFFS BY MOUTH EVERY 6 HOURS AS NEEDED FOR WHEEZE OR SHORTNESS OF BREATH, Disp: 18 g, Rfl: 0   Azelastine HCl 137 MCG/SPRAY SOLN, PLACE 2 SPRAYS INTO BOTH NOSTRILS 2 (TWO) TIMES DAILY. USE IN EACH NOSTRIL AS DIRECTED, Disp: 30 mL, Rfl: 5   B Complex-C (B-COMPLEX WITH VITAMIN C) tablet, Take 1 tablet by mouth daily., Disp: , Rfl:    budesonide (PULMICORT) 0.25 MG/2ML nebulizer solution, Take 0.25 mg by nebulization 2 (two) times daily., Disp: , Rfl:    Cholecalciferol (VITAMIN D3) 125 MCG (5000 UT) TABS, Take 5,000 Units by mouth daily., Disp: , Rfl:    clobetasol ointment (TEMOVATE) 0.05 %, Apply 1 Application topically 2 (two) times daily., Disp: 30 g, Rfl: 0   dapsone 25 MG tablet, TAKE 2 TABLETS BY MOUTH 2 TIMES DAILY., Disp: 360 tablet, Rfl: 1   diclofenac Sodium (VOLTAREN ARTHRITIS PAIN) 1 % GEL, Apply 2 g topically daily as needed (Chest pain)., Disp: , Rfl:    esomeprazole (NEXIUM) 40 MG capsule, TAKE 1 CAPSULE (40 MG TOTAL) BY MOUTH 2 (TWO) TIMES DAILY. FOR HEARTBURN., Disp: 180 capsule, Rfl: 2   famotidine (PEPCID) 40 MG tablet, Take 1 tablet (40 mg total) by  mouth 2 (two) times daily. for heartburn., Disp: 180 tablet, Rfl: 2   formoterol (PERFOROMIST) 20 MCG/2ML nebulizer solution, Take 2 mLs (20 mcg total) by nebulization 2 (two) times daily. (Patient taking differently: Take 20 mcg by nebulization daily as needed (Shortness of breath).), Disp: 120 mL, Rfl: 1   formoterol (PERFOROMIST) 20 MCG/2ML nebulizer solution, Take 20 mcg by nebulization 2 (two) times daily., Disp: , Rfl:    hydrOXYzine (VISTARIL) 50 MG capsule, TAKE 1 CAPSULE BY MOUTH ONCE OR TWICE DAILY AS NEEDED FOR ANXIETY, Disp: 180 capsule, Rfl: 2   levocetirizine (XYZAL) 5 MG tablet, Take 1 tablet (5 mg total) by mouth 2 (two) times daily. For allergies, Disp: 180 tablet, Rfl: 3   mometasone-formoterol (DULERA) 200-5 MCG/ACT AERO, Inhale 2 puffs into the lungs every 12 (twelve) hours., Disp: 13 g, Rfl: 5   Multiple Vitamin (MULTIVITAMIN) capsule, Take 1 capsule by mouth daily., Disp: , Rfl:    mycophenolate (CELLCEPT) 500 MG tablet, Take 3 tablets (1,500 mg total) by mouth 2 (two) times daily., Disp: 120 tablet, Rfl: 3   Omega-3 Fatty Acids (OMEGA-3 FISH OIL) 300 MG CAPS, Take 1,000 mg by mouth., Disp: , Rfl:    ondansetron (ZOFRAN) 4 MG tablet, Take 1 tablet (4 mg total) by mouth every 8 (eight) hours as needed for nausea or vomiting., Disp: 20 tablet, Rfl: 0   oxyCODONE-acetaminophen (PERCOCET) 7.5-325 MG tablet, Take 1 tablet by  mouth every 6 (six) hours as needed for up to 20 doses for severe pain. (Patient not taking: Reported on 11/06/2022), Disp: 20 tablet, Rfl: 0   predniSONE (DELTASONE) 10 MG tablet, TAKE 2 TABLETS BY MOUTH TWICE DAILY, Disp: 120 tablet, Rfl: 1   predniSONE (DELTASONE) 5 MG tablet, Take 1 tablet (5 mg total) by mouth daily with breakfast., Disp: 90 tablet, Rfl: 1   pregabalin (LYRICA) 25 MG capsule, Take 25 mg by mouth 2 (two) times daily., Disp: , Rfl:    rosuvastatin (CRESTOR) 10 MG tablet, TAKE 1 TABLET BY MOUTH EVERY DAY FOR CHOLESTEROL, Disp: 90 tablet, Rfl: 2    sucralfate (CARAFATE) 1 g tablet, Take 1 tablet (1 g total) by mouth in the morning, at noon, and at bedtime., Disp: 90 tablet, Rfl: 5   vitamin B-12 (CYANOCOBALAMIN) 1000 MCG tablet, Take 1,000 mcg by mouth daily., Disp: , Rfl:    vitamin C (ASCORBIC ACID) 500 MG tablet, Take 500 mg by mouth daily., Disp: , Rfl:    vitamin E 180 MG (400 UNITS) capsule, Take 400 Units by mouth daily., Disp: , Rfl:   Past Medical History: Past Medical History:  Diagnosis Date   Arthritis    Asthma    Environmental allergies    Esophageal dysphagia    Generalized anxiety disorder    GERD (gastroesophageal reflux disease)    History of hiatal hernia    Hx of colonic polyps    Hypertension    Lung disorder    Patient states she has two unidentified ILD   Migraines    MRSA (methicillin resistant Staphylococcus aureus)    Lt upper arm  approx 8 years ago   Sleep apnea     Tobacco Use: Social History   Tobacco Use  Smoking Status Former   Current packs/day: 0.00   Average packs/day: 2.0 packs/day for 18.0 years (36.1 ttl pk-yrs)   Types: Cigarettes   Start date: 66   Quit date: 03/16/2007   Years since quitting: 15.7   Passive exposure: Current  Smokeless Tobacco Never    Labs: Review Flowsheet  More data exists      Latest Ref Rng & Units 04/07/2019 07/07/2019 03/17/2020 06/09/2021 05/31/2022  Labs for ITP Cardiac and Pulmonary Rehab  Cholestrol 0 - 200 mg/dL 130  865  784  696  295   LDL (calc) 0 - 99 mg/dL 78  284  94  - -  Direct LDL mg/dL - - - 132.4  401.0   HDL-C >39.00 mg/dL 27.25  36.64  40.34  74.25  66.80   Trlycerides 0.0 - 149.0 mg/dL 956.3  875.6  433.2  951.8  252.0   Hemoglobin A1c 4.6 - 6.5 % - - 5.5  5.6  5.5     Details            Capillary Blood Glucose: No results found for: "GLUCAP"   Pulmonary Assessment Scores:  Pulmonary Assessment Scores     Row Name 08/15/22 0925         ADL UCSD   ADL Phase Entry     SOB Score total 65       CAT Score   CAT  Score 19       mMRC Score   mMRC Score 4             UCSD: Self-administered rating of dyspnea associated with activities of daily living (ADLs) 6-point scale (0 = "not at all" to 5 = "maximal  or unable to do because of breathlessness")  Scoring Scores range from 0 to 120.  Minimally important difference is 5 units  CAT: CAT can identify the health impairment of COPD patients and is better correlated with disease progression.  CAT has a scoring range of zero to 40. The CAT score is classified into four groups of low (less than 10), medium (10 - 20), high (21-30) and very high (31-40) based on the impact level of disease on health status. A CAT score over 10 suggests significant symptoms.  A worsening CAT score could be explained by an exacerbation, poor medication adherence, poor inhaler technique, or progression of COPD or comorbid conditions.  CAT MCID is 2 points  mMRC: mMRC (Modified Medical Research Council) Dyspnea Scale is used to assess the degree of baseline functional disability in patients of respiratory disease due to dyspnea. No minimal important difference is established. A decrease in score of 1 point or greater is considered a positive change.   Pulmonary Function Assessment:  Pulmonary Function Assessment - 08/15/22 0924       Breath   Shortness of Breath Yes;Limiting activity             Exercise Target Goals: Exercise Program Goal: Individual exercise prescription set using results from initial 6 min walk test and THRR while considering  patient's activity barriers and safety.   Exercise Prescription Goal: Initial exercise prescription builds to 30-45 minutes a day of aerobic activity, 2-3 days per week.  Home exercise guidelines will be given to patient during program as part of exercise prescription that the participant will acknowledge.  Activity Barriers & Risk Stratification:  Activity Barriers & Cardiac Risk Stratification - 08/15/22 0927        Activity Barriers & Cardiac Risk Stratification   Activity Barriers Deconditioning;Muscular Weakness;Shortness of Breath;Arthritis;Neck/Spine Problems;Joint Problems    Comments Rt shoulder bursitis and hip pain             6 Minute Walk:  6 Minute Walk     Row Name 08/15/22 1030         6 Minute Walk   Phase Initial     Distance 1170 feet     Walk Time 6 minutes     # of Rest Breaks 2  2:29-2:40, 4:56-5:10     MPH 2.22     METS 3.55     RPE 11     Perceived Dyspnea  2     VO2 Peak 12.44     Symptoms No     Resting HR 87 bpm     Resting BP 104/62     Resting Oxygen Saturation  96 %     Exercise Oxygen Saturation  during 6 min walk 85 %     Max Ex. HR 107 bpm     Max Ex. BP 134/70     2 Minute Post BP 118/64       Interval HR   1 Minute HR 104     2 Minute HR 107     3 Minute HR 102     4 Minute HR 98     5 Minute HR 97     6 Minute HR 99     2 Minute Post HR 79     Interval Heart Rate? Yes       Interval Oxygen   Interval Oxygen? Yes     Baseline Oxygen Saturation % 96 %     1 Minute Oxygen Saturation %  94 %     1 Minute Liters of Oxygen 8 L     2 Minute Oxygen Saturation % 90 %     2 Minute Liters of Oxygen 8 L     3 Minute Oxygen Saturation % 91 %  2:29 85%     3 Minute Liters of Oxygen 8 L  increased to 10L     4 Minute Oxygen Saturation % 95 %     4 Minute Liters of Oxygen 10 L     5 Minute Oxygen Saturation % 98 %     5 Minute Liters of Oxygen 10 L     6 Minute Oxygen Saturation % 94 %     6 Minute Liters of Oxygen 10 L     2 Minute Post Oxygen Saturation % 99 %     2 Minute Post Liters of Oxygen 8 L              Oxygen Initial Assessment:  Oxygen Initial Assessment - 08/15/22 0923       Home Oxygen   Home Oxygen Device E-Tanks;Home Concentrator    Sleep Oxygen Prescription Continuous    Liters per minute 4    Home Exercise Oxygen Prescription Continuous    Liters per minute 8    Home Resting Oxygen Prescription Continuous     Liters per minute 2    Compliance with Home Oxygen Use Yes      Initial 6 min Walk   Oxygen Used Continuous    Liters per minute 8      Program Oxygen Prescription   Program Oxygen Prescription Continuous    Liters per minute 8      Intervention   Short Term Goals To learn and exhibit compliance with exercise, home and travel O2 prescription;To learn and understand importance of maintaining oxygen saturations>88%;To learn and demonstrate proper use of respiratory medications;To learn and understand importance of monitoring SPO2 with pulse oximeter and demonstrate accurate use of the pulse oximeter.;To learn and demonstrate proper pursed lip breathing techniques or other breathing techniques.     Long  Term Goals Exhibits compliance with exercise, home  and travel O2 prescription;Verbalizes importance of monitoring SPO2 with pulse oximeter and return demonstration;Maintenance of O2 saturations>88%;Exhibits proper breathing techniques, such as pursed lip breathing or other method taught during program session;Compliance with respiratory medication;Demonstrates proper use of MDI's             Oxygen Re-Evaluation:  Oxygen Re-Evaluation     Row Name 08/31/22 1015 09/21/22 1351 10/25/22 1620 11/20/22 1631 12/19/22 1032     Program Oxygen Prescription   Program Oxygen Prescription Continuous Continuous Continuous Continuous Continuous   Liters per minute 8 10 10 10 10    Comments -- 94% on 10L 94% on 10L -- --     Home Oxygen   Home Oxygen Device E-Tanks;Home Concentrator E-Tanks;Home Concentrator E-Tanks;Home Concentrator E-Tanks;Home Concentrator E-Tanks;Home Concentrator   Sleep Oxygen Prescription Continuous Continuous Continuous Continuous Continuous   Liters per minute 4 4 4 4 4    Home Exercise Oxygen Prescription Continuous Continuous Continuous Continuous Continuous   Liters per minute 8 8 8 8 8    Home Resting Oxygen Prescription Continuous Continuous Continuous Continuous  Continuous   Liters per minute 2 2 2 2 2    Compliance with Home Oxygen Use Yes Yes Yes Yes Yes     Goals/Expected Outcomes   Short Term Goals To learn and exhibit compliance with exercise, home and travel O2  prescription;To learn and understand importance of maintaining oxygen saturations>88%;To learn and demonstrate proper use of respiratory medications;To learn and understand importance of monitoring SPO2 with pulse oximeter and demonstrate accurate use of the pulse oximeter.;To learn and demonstrate proper pursed lip breathing techniques or other breathing techniques.  To learn and exhibit compliance with exercise, home and travel O2 prescription;To learn and understand importance of maintaining oxygen saturations>88%;To learn and demonstrate proper use of respiratory medications;To learn and understand importance of monitoring SPO2 with pulse oximeter and demonstrate accurate use of the pulse oximeter.;To learn and demonstrate proper pursed lip breathing techniques or other breathing techniques.  To learn and exhibit compliance with exercise, home and travel O2 prescription;To learn and understand importance of maintaining oxygen saturations>88%;To learn and demonstrate proper use of respiratory medications;To learn and understand importance of monitoring SPO2 with pulse oximeter and demonstrate accurate use of the pulse oximeter.;To learn and demonstrate proper pursed lip breathing techniques or other breathing techniques.  To learn and exhibit compliance with exercise, home and travel O2 prescription;To learn and understand importance of maintaining oxygen saturations>88%;To learn and demonstrate proper use of respiratory medications;To learn and understand importance of monitoring SPO2 with pulse oximeter and demonstrate accurate use of the pulse oximeter.;To learn and demonstrate proper pursed lip breathing techniques or other breathing techniques.  To learn and exhibit compliance with exercise, home  and travel O2 prescription;To learn and understand importance of maintaining oxygen saturations>88%;To learn and demonstrate proper use of respiratory medications;To learn and understand importance of monitoring SPO2 with pulse oximeter and demonstrate accurate use of the pulse oximeter.;To learn and demonstrate proper pursed lip breathing techniques or other breathing techniques.    Long  Term Goals Exhibits compliance with exercise, home  and travel O2 prescription;Verbalizes importance of monitoring SPO2 with pulse oximeter and return demonstration;Maintenance of O2 saturations>88%;Exhibits proper breathing techniques, such as pursed lip breathing or other method taught during program session;Compliance with respiratory medication;Demonstrates proper use of MDI's Exhibits compliance with exercise, home  and travel O2 prescription;Verbalizes importance of monitoring SPO2 with pulse oximeter and return demonstration;Maintenance of O2 saturations>88%;Exhibits proper breathing techniques, such as pursed lip breathing or other method taught during program session;Compliance with respiratory medication;Demonstrates proper use of MDI's Exhibits compliance with exercise, home  and travel O2 prescription;Verbalizes importance of monitoring SPO2 with pulse oximeter and return demonstration;Maintenance of O2 saturations>88%;Exhibits proper breathing techniques, such as pursed lip breathing or other method taught during program session;Compliance with respiratory medication;Demonstrates proper use of MDI's Exhibits compliance with exercise, home  and travel O2 prescription;Verbalizes importance of monitoring SPO2 with pulse oximeter and return demonstration;Maintenance of O2 saturations>88%;Exhibits proper breathing techniques, such as pursed lip breathing or other method taught during program session;Compliance with respiratory medication;Demonstrates proper use of MDI's Exhibits compliance with exercise, home  and travel  O2 prescription;Verbalizes importance of monitoring SPO2 with pulse oximeter and return demonstration;Maintenance of O2 saturations>88%;Exhibits proper breathing techniques, such as pursed lip breathing or other method taught during program session;Compliance with respiratory medication;Demonstrates proper use of MDI's   Goals/Expected Outcomes Compliance and understanding of oxygen saturation monitoring and breathing techniques to decrease shortness of breath. Compliance and understanding of oxygen saturation monitoring and breathing techniques to decrease shortness of breath. Compliance and understanding of oxygen saturation monitoring and breathing techniques to decrease shortness of breath. Compliance and understanding of oxygen saturation monitoring and breathing techniques to decrease shortness of breath. Compliance and understanding of oxygen saturation monitoring and breathing techniques to decrease shortness of breath.  Oxygen Discharge (Final Oxygen Re-Evaluation):  Oxygen Re-Evaluation - 12/19/22 1032       Program Oxygen Prescription   Program Oxygen Prescription Continuous    Liters per minute 10      Home Oxygen   Home Oxygen Device E-Tanks;Home Concentrator    Sleep Oxygen Prescription Continuous    Liters per minute 4    Home Exercise Oxygen Prescription Continuous    Liters per minute 8    Home Resting Oxygen Prescription Continuous    Liters per minute 2    Compliance with Home Oxygen Use Yes      Goals/Expected Outcomes   Short Term Goals To learn and exhibit compliance with exercise, home and travel O2 prescription;To learn and understand importance of maintaining oxygen saturations>88%;To learn and demonstrate proper use of respiratory medications;To learn and understand importance of monitoring SPO2 with pulse oximeter and demonstrate accurate use of the pulse oximeter.;To learn and demonstrate proper pursed lip breathing techniques or other breathing  techniques.     Long  Term Goals Exhibits compliance with exercise, home  and travel O2 prescription;Verbalizes importance of monitoring SPO2 with pulse oximeter and return demonstration;Maintenance of O2 saturations>88%;Exhibits proper breathing techniques, such as pursed lip breathing or other method taught during program session;Compliance with respiratory medication;Demonstrates proper use of MDI's    Goals/Expected Outcomes Compliance and understanding of oxygen saturation monitoring and breathing techniques to decrease shortness of breath.             Initial Exercise Prescription:  Initial Exercise Prescription - 08/15/22 1000       Date of Initial Exercise RX and Referring Provider   Date 08/15/22    Referring Provider Soskis   sees [name]   Expected Discharge Date 11/08/22      Oxygen   Oxygen Continuous    Liters 8      Treadmill   MPH 2    Grade 0    Minutes 15    METs 2.53      Recumbant Elliptical   Level 1    RPM 30    Watts 70    Minutes 15    METs 2.3      Prescription Details   Frequency (times per week) 2    Duration Progress to 30 minutes of continuous aerobic without signs/symptoms of physical distress      Intensity   THRR 40-80% of Max Heartrate 69-138    Ratings of Perceived Exertion 11-13    Perceived Dyspnea 0-4      Progression   Progression Continue to progress workloads to maintain intensity without signs/symptoms of physical distress.      Resistance Training   Training Prescription Yes    Weight blue bands    Reps 10-15             Perform Capillary Blood Glucose checks as needed.  Exercise Prescription Changes:   Exercise Prescription Changes     Row Name 08/21/22 1200 09/04/22 1200 09/18/22 1200 10/02/22 1200 10/16/22 1200     Response to Exercise   Blood Pressure (Admit) 114/66 122/70 124/62 118/62 110/64   Blood Pressure (Exercise) 140/66 146/60 150/60 134/62 120/64   Blood Pressure (Exit) 102/64 108/66 118/60  112/68 110/60   Heart Rate (Admit) 75 bpm 75 bpm 73 bpm 71 bpm 62 bpm   Heart Rate (Exercise) 110 bpm 105 bpm 100 bpm 110 bpm 107 bpm   Heart Rate (Exit) 80 bpm 81 bpm 76 bpm 83 bpm 77 bpm  Oxygen Saturation (Admit) 98 % 96 % 97 % 96 % 98 %   Oxygen Saturation (Exercise) 92 % 92 % 94 % 89 % 85 %  85% on 8L, 90% on 10L   Oxygen Saturation (Exit) 97 % 97 % 97 % 96 % 95 %   Rating of Perceived Exertion (Exercise) 15 13 14 13 13    Perceived Dyspnea (Exercise) 2 2 2 2 2    Symptoms DOE -- DOE -- --   Duration Progress to 30 minutes of  aerobic without signs/symptoms of physical distress Progress to 30 minutes of  aerobic without signs/symptoms of physical distress Continue with 30 min of aerobic exercise without signs/symptoms of physical distress. Continue with 30 min of aerobic exercise without signs/symptoms of physical distress. Continue with 30 min of aerobic exercise without signs/symptoms of physical distress.   Intensity THRR unchanged THRR unchanged THRR unchanged THRR unchanged THRR unchanged     Progression   Progression Continue to progress workloads to maintain intensity without signs/symptoms of physical distress. Continue to progress workloads to maintain intensity without signs/symptoms of physical distress. Continue to progress workloads to maintain intensity without signs/symptoms of physical distress. Continue to progress workloads to maintain intensity without signs/symptoms of physical distress. Continue to progress workloads to maintain intensity without signs/symptoms of physical distress.   Average METs 2.3 2.5 -- -- --     Resistance Training   Training Prescription Yes Yes Yes Yes Yes   Weight blue bands blue bands blue bands blue bands blue bands   Reps 10-15 10-15 10-15 10-15 10-15   Time -- 10 Minutes 10 Minutes 10 Minutes 10 Minutes     Interval Training   Interval Training -- No -- -- --     Oxygen   Oxygen Continuous Continuous Continuous Continuous Continuous    Liters 10 10 10  8-10 8-10     Treadmill   MPH 1.8 2 2 2  2.4   Grade 0 0 1 1 1.5   Minutes 15 15 15 15 15    METs 2.38 2.53 2.81 2.81 3.33     Recumbant Elliptical   Level 2 2 3 3 3    RPM 40 -- 40 -- --   Minutes 15 15 15 15 15    METs 2.1 2.5 2.6 2.3 3.1     Oxygen   Maintain Oxygen Saturation 88% or higher 88% or higher 88% or higher 88% or higher 88% or higher    Row Name 10/30/22 1100 11/15/22 1221 11/27/22 1200 12/11/22 0900 12/25/22 0900     Response to Exercise   Blood Pressure (Admit) 118/60 120/58 -- 128/68 122/68   Blood Pressure (Exercise) 128/62 -- -- 146/64 134/76   Blood Pressure (Exit) 100/68 98/52 -- 118/68 114/60   Heart Rate (Admit) 69 bpm 70 bpm -- 79 bpm 77 bpm   Heart Rate (Exercise) 84 bpm -- -- 97 bpm 108 bpm   Heart Rate (Exit) 72 bpm 88 bpm -- 74 bpm 79 bpm   Oxygen Saturation (Admit) 99 % 97 %  3L -- 98 %  4L 95 %   Oxygen Saturation (Exercise) 99 % 92 %  10L -- 94 %  10L 90 %   Oxygen Saturation (Exit) 97 % 92 %  3L -- 95 %  3L 91 %   Rating of Perceived Exertion (Exercise) 11 13 -- 13 11   Perceived Dyspnea (Exercise) 1 2 -- 1 1   Duration Continue with 30 min of aerobic exercise without signs/symptoms of  physical distress. Continue with 30 min of aerobic exercise without signs/symptoms of physical distress. -- Continue with 30 min of aerobic exercise without signs/symptoms of physical distress. Continue with 30 min of aerobic exercise without signs/symptoms of physical distress.   Intensity THRR unchanged THRR unchanged -- THRR unchanged THRR unchanged     Progression   Progression Continue to progress workloads to maintain intensity without signs/symptoms of physical distress. Continue to progress workloads to maintain intensity without signs/symptoms of physical distress. -- Continue to progress workloads to maintain intensity without signs/symptoms of physical distress. Continue to progress workloads to maintain intensity without signs/symptoms of  physical distress.     Resistance Training   Training Prescription Yes Yes -- Yes Yes   Weight blue bands blue bands -- blue bands black bands   Reps 10-15 10-15 -- 10-15 10-15   Time 10 Minutes 10 Minutes -- 10 Minutes 10 Minutes     Interval Training   Interval Training -- -- -- No Yes   Equipment -- -- -- -- Treadmill   Comments -- -- -- -- 1 min @ @ 2 mph + 0 incline     Oxygen   Oxygen Continuous Continuous -- Continuous Continuous   Liters 8-10 --  3L with rest, 10L with exertion -- 3-10L 1-6     Treadmill   MPH 2.5 2.5 -- 2.5 3   Grade 1.5 2 -- 2 1   Minutes 15 15 -- 15 15   METs 3.43 3.6 -- 3.6 3.5     Recumbant Elliptical   Level 4 4 -- 4 5   Minutes 15 15 -- 15 15   METs 2.9 2.9 -- 3 3.1     Oxygen   Maintain Oxygen Saturation 88% or higher 88% or higher -- 88% or higher 88% or higher            Exercise Comments:   Exercise Comments     Row Name 08/21/22 1155 10/02/22 1539         Exercise Comments Pt completed first day of group exercise. She exercised on the recumbent elliptical for 15 min, level2, METs 2.1. She then walked on the TM for 15 min, speed 1.8 for 12 min, 1.2 mph for 3 min. Tolerated fair. She tolerated warm up and cool down well, performed squats. Discussed METs. Pt is generally overwhelmed by her diagnosis but is managing. Completed home ExRx. Neysa Bonito is currently walking at home. She walks 1 non-rehab day/wk for 10-15 min/day. We discussed Lung Transplant guidelines and building to meeting those guidelines. I mentioned walking 3 non-rehab days/wk for at least 20 min/day. She agreed with my recommendations. I discussed increasing her walking time first and then frequency. I recommended doing a slow progression to meeting transplant guidelines. Neysa Bonito agreed to my progression plan. We also discussed exercise strategies as Neysa Bonito is walking outside. She is willing to try going to the local [employer] to avoid the heat and humidity. I discussed  weather guidelines with [name]. She voiced understanding. She seems motivated to exercise and increase her functional capacity.               Exercise Goals and Review:   Exercise Goals     Row Name 08/15/22 7846 08/31/22 1011 09/21/22 1346 10/25/22 1617       Exercise Goals   Increase Physical Activity Yes Yes Yes Yes    Intervention Provide advice, education, support and counseling about physical activity/exercise needs.;Develop an individualized exercise prescription  for aerobic and resistive training based on initial evaluation findings, risk stratification, comorbidities and participant's personal goals. Provide advice, education, support and counseling about physical activity/exercise needs.;Develop an individualized exercise prescription for aerobic and resistive training based on initial evaluation findings, risk stratification, comorbidities and participant's personal goals. Provide advice, education, support and counseling about physical activity/exercise needs.;Develop an individualized exercise prescription for aerobic and resistive training based on initial evaluation findings, risk stratification, comorbidities and participant's personal goals. Provide advice, education, support and counseling about physical activity/exercise needs.;Develop an individualized exercise prescription for aerobic and resistive training based on initial evaluation findings, risk stratification, comorbidities and participant's personal goals.    Expected Outcomes Short Term: Attend rehab on a regular basis to increase amount of physical activity.;Long Term: Exercising regularly at least 3-5 days a week.;Long Term: Add in home exercise to make exercise part of routine and to increase amount of physical activity. Short Term: Attend rehab on a regular basis to increase amount of physical activity.;Long Term: Exercising regularly at least 3-5 days a week.;Long Term: Add in home exercise to make exercise part of  routine and to increase amount of physical activity. Short Term: Attend rehab on a regular basis to increase amount of physical activity.;Long Term: Exercising regularly at least 3-5 days a week.;Long Term: Add in home exercise to make exercise part of routine and to increase amount of physical activity. Short Term: Attend rehab on a regular basis to increase amount of physical activity.;Long Term: Exercising regularly at least 3-5 days a week.;Long Term: Add in home exercise to make exercise part of routine and to increase amount of physical activity.    Increase Strength and Stamina Yes Yes Yes Yes    Intervention Provide advice, education, support and counseling about physical activity/exercise needs.;Develop an individualized exercise prescription for aerobic and resistive training based on initial evaluation findings, risk stratification, comorbidities and participant's personal goals. Provide advice, education, support and counseling about physical activity/exercise needs.;Develop an individualized exercise prescription for aerobic and resistive training based on initial evaluation findings, risk stratification, comorbidities and participant's personal goals. Provide advice, education, support and counseling about physical activity/exercise needs.;Develop an individualized exercise prescription for aerobic and resistive training based on initial evaluation findings, risk stratification, comorbidities and participant's personal goals. Provide advice, education, support and counseling about physical activity/exercise needs.;Develop an individualized exercise prescription for aerobic and resistive training based on initial evaluation findings, risk stratification, comorbidities and participant's personal goals.    Expected Outcomes Short Term: Increase workloads from initial exercise prescription for resistance, speed, and METs.;Short Term: Perform resistance training exercises routinely during rehab and add  in resistance training at home;Long Term: Improve cardiorespiratory fitness, muscular endurance and strength as measured by increased METs and functional capacity ( ) Short Term: Increase workloads from initial exercise prescription for resistance, speed, and METs.;Short Term: Perform resistance training exercises routinely during rehab and add in resistance training at home;Long Term: Improve cardiorespiratory fitness, muscular endurance and strength as measured by increased METs and functional capacity ( ) Short Term: Increase workloads from initial exercise prescription for resistance, speed, and METs.;Short Term: Perform resistance training exercises routinely during rehab and add in resistance training at home;Long Term: Improve cardiorespiratory fitness, muscular endurance and strength as measured by increased METs and functional capacity ( ) Short Term: Increase workloads from initial exercise prescription for resistance, speed, and METs.;Short Term: Perform resistance training exercises routinely during rehab and add in resistance training at home;Long Term: Improve cardiorespiratory fitness, muscular endurance and strength as measured by increased  METs and functional capacity ( )    Able to understand and use rate of perceived exertion (RPE) scale Yes Yes Yes Yes    Intervention Provide education and explanation on how to use RPE scale Provide education and explanation on how to use RPE scale Provide education and explanation on how to use RPE scale Provide education and explanation on how to use RPE scale    Expected Outcomes Short Term: Able to use RPE daily in rehab to express subjective intensity level;Long Term:  Able to use RPE to guide intensity level when exercising independently Short Term: Able to use RPE daily in rehab to express subjective intensity level;Long Term:  Able to use RPE to guide intensity level when exercising independently Short Term: Able to use RPE daily in rehab  to express subjective intensity level;Long Term:  Able to use RPE to guide intensity level when exercising independently Short Term: Able to use RPE daily in rehab to express subjective intensity level;Long Term:  Able to use RPE to guide intensity level when exercising independently    Able to understand and use Dyspnea scale Yes Yes Yes Yes    Intervention Provide education and explanation on how to use Dyspnea scale Provide education and explanation on how to use Dyspnea scale Provide education and explanation on how to use Dyspnea scale Provide education and explanation on how to use Dyspnea scale    Expected Outcomes Short Term: Able to use Dyspnea scale daily in rehab to express subjective sense of shortness of breath during exertion;Long Term: Able to use Dyspnea scale to guide intensity level when exercising independently Short Term: Able to use Dyspnea scale daily in rehab to express subjective sense of shortness of breath during exertion;Long Term: Able to use Dyspnea scale to guide intensity level when exercising independently Short Term: Able to use Dyspnea scale daily in rehab to express subjective sense of shortness of breath during exertion;Long Term: Able to use Dyspnea scale to guide intensity level when exercising independently Short Term: Able to use Dyspnea scale daily in rehab to express subjective sense of shortness of breath during exertion;Long Term: Able to use Dyspnea scale to guide intensity level when exercising independently    Knowledge and understanding of Target Heart Rate Range (THRR) Yes Yes Yes Yes    Intervention Provide education and explanation of THRR including how the numbers were predicted and where they are located for reference Provide education and explanation of THRR including how the numbers were predicted and where they are located for reference Provide education and explanation of THRR including how the numbers were predicted and where they are located for  reference Provide education and explanation of THRR including how the numbers were predicted and where they are located for reference    Expected Outcomes Short Term: Able to state/look up THRR;Long Term: Able to use THRR to govern intensity when exercising independently;Short Term: Able to use daily as guideline for intensity in rehab Short Term: Able to state/look up THRR;Long Term: Able to use THRR to govern intensity when exercising independently;Short Term: Able to use daily as guideline for intensity in rehab Short Term: Able to state/look up THRR;Long Term: Able to use THRR to govern intensity when exercising independently;Short Term: Able to use daily as guideline for intensity in rehab Short Term: Able to state/look up THRR;Long Term: Able to use THRR to govern intensity when exercising independently;Short Term: Able to use daily as guideline for intensity in rehab    Understanding of Exercise  Prescription Yes Yes Yes Yes    Intervention Provide education, explanation, and written materials on patient's individual exercise prescription Provide education, explanation, and written materials on patient's individual exercise prescription Provide education, explanation, and written materials on patient's individual exercise prescription Provide education, explanation, and written materials on patient's individual exercise prescription    Expected Outcomes Short Term: Able to explain program exercise prescription;Long Term: Able to explain home exercise prescription to exercise independently Short Term: Able to explain program exercise prescription;Long Term: Able to explain home exercise prescription to exercise independently Short Term: Able to explain program exercise prescription;Long Term: Able to explain home exercise prescription to exercise independently Short Term: Able to explain program exercise prescription;Long Term: Able to explain home exercise prescription to exercise independently              Exercise Goals Re-Evaluation :  Exercise Goals Re-Evaluation     Row Name 08/31/22 1011 09/21/22 1346 10/25/22 1618 11/20/22 1629 12/19/22 1029     Exercise Goal Re-Evaluation   Exercise Goals Review Increase Physical Activity;Able to understand and use Dyspnea scale;Understanding of Exercise Prescription;Increase Strength and Stamina;Knowledge and understanding of Target Heart Rate Range (THRR);Able to understand and use rate of perceived exertion (RPE) scale Increase Physical Activity;Able to understand and use Dyspnea scale;Understanding of Exercise Prescription;Increase Strength and Stamina;Knowledge and understanding of Target Heart Rate Range (THRR);Able to understand and use rate of perceived exertion (RPE) scale Increase Physical Activity;Able to understand and use Dyspnea scale;Understanding of Exercise Prescription;Increase Strength and Stamina;Knowledge and understanding of Target Heart Rate Range (THRR);Able to understand and use rate of perceived exertion (RPE) scale Increase Physical Activity;Able to understand and use Dyspnea scale;Understanding of Exercise Prescription;Increase Strength and Stamina;Knowledge and understanding of Target Heart Rate Range (THRR);Able to understand and use rate of perceived exertion (RPE) scale Increase Physical Activity;Able to understand and use Dyspnea scale;Understanding of Exercise Prescription;Increase Strength and Stamina;Knowledge and understanding of Target Heart Rate Range (THRR);Able to understand and use rate of perceived exertion (RPE) scale   Comments Neysa Bonito has completed 2 exercise sessions. She exercises for 15 min on the recumbent elliptical and treadmill. She averages 2.5 METs at level 2 on the recumbent elliptical and 2.53 METs at 2 mph and 0% incline. She performs the warmup and cooldown standing without limitations. It is too soon to note any discernable progressions. Will continue to monitor and progress as able. Neysa Bonito has  completed 8 exercise sessions. She exercises for 15 min on the recumbent elliptical and treadmill. She averages 2.5 METs at level 3 on the recumbent elliptical and 2.81 METs at 2 mph and 1% incline. She performs the warmup and cooldown standing without limitations. Neysa Bonito has increased her workload for both exercise modes as METs have increased. She tolerates progressions well. Neysa Bonito seems motivated to exercise and improve her functional capacity. She enjoys class as she converses with other patients. Will continue to monitor and progress as able. Neysa Bonito has completed 16 exercise sessions. She exercises for 15 min on the recumbent elliptical and treadmill. She averages 3.0 METs at level 4 on the recumbent elliptical and 3.1 METs at 2.5 mph and 1.5% incline. She performs the warmup and cooldown standing without limitations. Neysa Bonito is still progressing her workloads steadily. She is determined to reach her full functional capacity in preparation for the [facility] lung transplant program. Neysa Bonito has starting walking around her neighborhood on a regular basis. Will continue to monitor and progress as able. Neysa Bonito has completed 21 exercise sessions. She exercises for 15  min on the recumbent elliptical and treadmill. She averages 3.0 METs at level 4 on the recumbent elliptical and 3.3 METs at 2.5 mph and 2% incline. She performs the warmup and cooldown standing without limitations. Neysa Bonito is still trying to progress workloads. She increased her workload slightly on the treadmill. I have asked for an update on her home exercise regimen. Neysa Bonito is walking out home on a regular basis. Will continue to monitor and progress as able. Neysa Bonito has completed 27 exercise sessions. She exercises for 15 min on the recumbent elliptical and treadmill. She averages 3.6 METs at level 4 on the recumbent elliptical and 3.4 METs at 2.5 mph and 2% incline. She performs the warmup and cooldown standing without limitations. Neysa Bonito has not  increased her workload for either exercise mode although METs have increased on the recumbent elliptical. Neysa Bonito is also progressing in her home exercise as her time has increased. Will continue to monitor and progress as able.   Expected Outcomes Through exercise at rehab and home, the patient will decrease shortness of breath with daily activities and feel confident in carrying out an exercise regimen at home. Through exercise at rehab and home, the patient will decrease shortness of breath with daily activities and feel confident in carrying out an exercise regimen at home. Through exercise at rehab and home, the patient will decrease shortness of breath with daily activities and feel confident in carrying out an exercise regimen at home. Through exercise at rehab and home, the patient will decrease shortness of breath with daily activities and feel confident in carrying out an exercise regimen at home. Through exercise at rehab and home, the patient will decrease shortness of breath with daily activities and feel confident in carrying out an exercise regimen at home.            Discharge Exercise Prescription (Final Exercise Prescription Changes):  Exercise Prescription Changes - 12/25/22 0900       Response to Exercise   Blood Pressure (Admit) 122/68    Blood Pressure (Exercise) 134/76    Blood Pressure (Exit) 114/60    Heart Rate (Admit) 77 bpm    Heart Rate (Exercise) 108 bpm    Heart Rate (Exit) 79 bpm    Oxygen Saturation (Admit) 95 %    Oxygen Saturation (Exercise) 90 %    Oxygen Saturation (Exit) 91 %    Rating of Perceived Exertion (Exercise) 11    Perceived Dyspnea (Exercise) 1    Duration Continue with 30 min of aerobic exercise without signs/symptoms of physical distress.    Intensity THRR unchanged      Progression   Progression Continue to progress workloads to maintain intensity without signs/symptoms of physical distress.      Resistance Training   Training  Prescription Yes    Weight black bands    Reps 10-15    Time 10 Minutes      Interval Training   Interval Training Yes    Equipment Treadmill    Comments 1 min @ @ 2 mph + 0 incline      Oxygen   Oxygen Continuous    Liters 1-6      Treadmill   MPH 3    Grade 1    Minutes 15    METs 3.5      Recumbant Elliptical   Level 5    Minutes 15    METs 3.1      Oxygen   Maintain Oxygen Saturation 88% or  higher             Nutrition:  Target Goals: Understanding of nutrition guidelines, daily intake of sodium 1500mg , cholesterol 200mg , calories 30% from fat and 7% or less from saturated fats, daily to have 5 or more servings of fruits and vegetables.  Biometrics:    Nutrition Therapy Plan and Nutrition Goals:  Nutrition Therapy & Goals - 12/20/22 0905       Nutrition Therapy   Diet heart healthy diet    Drug/Food Interactions Statins/Certain Fruits      Personal Nutrition Goals   Nutrition Goal Patient to improve diet quality by using the plate method as a guide for meal planning to include lean protein/plant protein, fruits, vegetables, whole grains, nonfat dairy as part of a well-balanced diet.   goal in progress.   Personal Goal #2 Patient to identify strategies for weight loss of 0.5-2.0# per week.   goal in progress.   Personal Goal #3 Patient to identify food sources and limit daily intake of saturated fat, tran fat, sodium, and refined carbohdyrates.   goal in progress.   Comments Goals in action. We have discussed multiple strategies for weight loss including increasing low calorie dense foods, protein supplements, the plate method as a guide for meal planning, mindful eating strategies, and tracking intake. She was not successful with weight loss with these methods; recommended starting a Very Low Calorie Diet rich in protein and non-starchy vegetables for 30+ days to aid with creating consistent calorie deficit  She is down 18.5# from her highest weight  with our program (109.8kg).  Neysa Bonito is currently working toward lung transplant through [facility]. Per documenation with [facility] transplant dietitian, 6/13 and 9/26, she is working toward an initial weight loss goal of 203# and ultimatley a BMI of 27-28. She reports that multiple weight loss drugs including wegovy have been denied by her insurance company. Patient will benefit from participation in pulmonary rehab for nutrition, exercise, and lifestyle modification.      Intervention Plan   Intervention Prescribe, educate and counsel regarding individualized specific dietary modifications aiming towards targeted core components such as weight, hypertension, lipid management, diabetes, heart failure and other comorbidities.;Nutrition handout(s) given to patient.    Expected Outcomes Short Term Goal: Understand basic principles of dietary content, such as calories, fat, sodium, cholesterol and nutrients.;Long Term Goal: Adherence to prescribed nutrition plan.;Short Term Goal: A plan has been developed with personal nutrition goals set during dietitian appointment.             Nutrition Assessments:  MEDIFICTS Score Key: >=70 Need to make dietary changes  40-70 Heart Healthy Diet <= 40 Therapeutic Level Cholesterol Diet   Picture Your Plate Scores: <46 Unhealthy dietary pattern with much room for improvement. 41-50 Dietary pattern unlikely to meet recommendations for good health and room for improvement. 51-60 More healthful dietary pattern, with some room for improvement.  >60 Healthy dietary pattern, although there may be some specific behaviors that could be improved.    Nutrition Goals Re-Evaluation:  Nutrition Goals Re-Evaluation     Row Name 08/21/22 1055 09/18/22 1153 10/16/22 1123 11/13/22 1527 12/20/22 0905     Goals   Current Weight 235 lb 7.2 oz (106.8 kg) 242 lb 4.6 oz (109.9 kg) 235 lb 7.2 oz (106.8 kg) 231 lb 14.8 oz (105.2 kg) 223 lb 8.7 oz (101.4 kg)   Comment A1c WNL,  cholesterol 247, triglycerides 252 no new labs; most recent labs A1c WNL, cholesterol 247, triglycerides 25  252 no new labs; most recent labs A1c WNL, cholesterol 247, triglycerides 252 no new labs; most recent labs A1c WNL, cholesterol 247, triglycerides 252 no new pertinent labs; most recent labs A1c WNL, cholesterol 247, triglycerides 252   Expected Outcome Neysa Bonito is currently working toward lung transplant through [facility]. Per documenation, 6/13, she is working toward an initial weight loss goal of 203# and ultimatley a BMI of 27-28. She reports that multiple weight loss drugs including wegovy have been denied by her insurance company. She is taking prednisone. She has not started making many dietary changes; she continues snacking on pork rinds and twizzlers and eats out 1-2x per week including sweet tea at these meals. Patient will benefit from participation in pulmonary rehab for nutrition, exercise, and lifestyle modification. Goals in progress. We have discussed multiple strategies for weight loss including increasing low calorie dense foods, protein supplements, the plate method as a guide for meal planning, mindful eating strategies, and tracking intake. She has not met weight loss goals and continues to struggle with oversnacking/boredom type snacking. She is up 6.8# since starting with our program. Neysa Bonito is currently working toward lung transplant through [facility]. Per documenation, 6/13, she is working toward an initial weight loss goal of 203# and ultimatley a BMI of 27-28. She reports that multiple weight loss drugs including wegovy have been denied by her insurance company. She is taking prednisone. Patient will benefit from participation in pulmonary rehab for nutrition, exercise, and lifestyle modification. Goals in progress. We have discussed multiple strategies for weight loss including increasing low calorie dense foods, protein supplements, the plate method as a guide for meal planning, mindful  eating strategies, and tracking intake. She has not met weight loss goals and continues to struggle with oversnacking/boredom type snacking and over eating high fat foods (bacon). She is up 4# since starting with our program though down from her highest weight with our program. Neysa Bonito is currently working toward lung transplant through [facility]. Per documenation, 6/13, she is working toward an initial weight loss goal of 203# and ultimatley a BMI of 27-28. She reports that multiple weight loss drugs including wegovy have been denied by her insurance company. She is taking prednisone. Recommended starting a Very Low Calorie Diet for 14 days to aid with creating consistent calorie deficit; patient is receptive to this. Patient will benefit from participation in pulmonary rehab for nutrition, exercise, and lifestyle modification. Goals in action. We have discussed multiple strategies for weight loss including increasing low calorie dense foods, protein supplements, the plate method as a guide for meal planning, mindful eating strategies, and tracking intake. She was not successful with weight loss with these methods; recommended starting a Very Low Calorie Diet for 14-30 days to aid with creating consistent calorie deficit She starting this and is down 10# (105.2kg/231.4#) from her highest weight with our program (109.8kg). Neysa Bonito is currently working toward lung transplant through [facility]. Per documenation, 6/13, she is working toward an initial weight loss goal of 203# and ultimatley a BMI of 27-28. She reports that multiple weight loss drugs including wegovy have been denied by her insurance company. Patient will benefit from participation in pulmonary rehab for nutrition, exercise, and lifestyle modification. Goals in action. We have discussed multiple strategies for weight loss including increasing low calorie dense foods, protein supplements, the plate method as a guide for meal planning, mindful eating strategies,  and tracking intake. She was not successful with weight loss with these methods; recommended starting a Very Low  Calorie Diet rich in protein and non-starchy vegetables for 30+ days to aid with creating consistent calorie deficit She is down 18.5# from her highest weight with our program (109.8kg). Neysa Bonito is currently working toward lung transplant through [facility]. Per documenation with [facility] transplant dietitian, 6/13 and 9/26, she is working toward an initial weight loss goal of 203# and ultimatley a BMI of 27-28. She reports that multiple weight loss drugs including wegovy have been denied by her insurance company. Patient will benefit from participation in pulmonary rehab for nutrition, exercise, and lifestyle modification.            Nutrition Goals Discharge (Final Nutrition Goals Re-Evaluation):  Nutrition Goals Re-Evaluation - 12/20/22 0905       Goals   Current Weight 223 lb 8.7 oz (101.4 kg)    Comment no new pertinent labs; most recent labs A1c WNL, cholesterol 247, triglycerides 252    Expected Outcome Goals in action. We have discussed multiple strategies for weight loss including increasing low calorie dense foods, protein supplements, the plate method as a guide for meal planning, mindful eating strategies, and tracking intake. She was not successful with weight loss with these methods; recommended starting a Very Low Calorie Diet rich in protein and non-starchy vegetables for 30+ days to aid with creating consistent calorie deficit She is down 18.5# from her highest weight with our program (109.8kg). Neysa Bonito is currently working toward lung transplant through [place]. Per documenation with [facility] transplant dietitian, 6/13 and 9/26, she is working toward an initial weight loss goal of 203# and ultimatley a BMI of 27-28. She reports that multiple weight loss drugs including wegovy have been denied by her insurance company. Patient will benefit from participation in pulmonary rehab for  nutrition, exercise, and lifestyle modification.             Psychosocial: Target Goals: Acknowledge presence or absence of significant depression and/or stress, maximize coping skills, provide positive support system. Participant is able to verbalize types and ability to use techniques and skills needed for reducing stress and depression.  Initial Review & Psychosocial Screening:  Initial Psych Review & Screening - 08/15/22 2536       Initial Review   Current issues with Current Anxiety/Panic    Comments Pt is on medication for anixety. Feels that anxiety is controlled.      Family Dynamics   Good Support System? Yes    Comments mother, 3 brothers, step mother, and friends      Barriers   Psychosocial barriers to participate in program There are no identifiable barriers or psychosocial needs.      Screening Interventions   Interventions Encouraged to exercise             Quality of Life Scores:  Scores of 19 and below usually indicate a poorer quality of life in these areas.  A difference of  2-3 points is a clinically meaningful difference.  A difference of 2-3 points in the total score of the Quality of Life Index has been associated with significant improvement in overall quality of life, self-image, physical symptoms, and general health in studies assessing change in quality of life.  PHQ-9: Review Flowsheet       08/15/2022 08/14/2022 06/09/2021  Depression screen PHQ 2/9  Decreased Interest 1 1 1   Down, Depressed, Hopeless 0 0 0  PHQ - 2 Score 1 1 1   Altered sleeping 0 - 1  Tired, decreased energy 0 - 1  Change in appetite 1 -  0  Feeling bad or failure about yourself  0 - 0  Trouble concentrating 0 - 0  Moving slowly or fidgety/restless 0 - 0  Suicidal thoughts 0 - 0  PHQ-9 Score 2 - 3  Difficult doing work/chores Not difficult at all - Not difficult at all    Details           Interpretation of Total Score  Total Score Depression Severity:  1-4 =  Minimal depression, 5-9 = Mild depression, 10-14 = Moderate depression, 15-19 = Moderately severe depression, 20-27 = Severe depression   Psychosocial Evaluation and Intervention:  Psychosocial Evaluation - 08/15/22 0919       Psychosocial Evaluation & Interventions   Interventions Encouraged to exercise with the program and follow exercise prescription    Comments Neysa Bonito denies any psychosocial barriers to participating in rehab. Will continue to monitor.    Expected Outcomes For [name] to participate in rehab free of psychosocial barriers.    Continue Psychosocial Services  No Follow up required             Psychosocial Re-Evaluation:  Psychosocial Re-Evaluation     Row Name 09/03/22 0919 09/28/22 0910 10/22/22 1509 11/19/22 0914 12/17/22 1122     Psychosocial Re-Evaluation   Current issues with None Identified None Identified None Identified None Identified None Identified   Comments Neysa Bonito denies any new psychosocial barriers or concerns at this time. Neysa Bonito continues to deny any psychosocial barriers or concerns at this time. Neysa Bonito again denies any needs from the psychosocial eval. She states she has a therapist she sees and is compliant with taking her psychotropic meds. Neysa Bonito is still being worked up for a possible lung transplant. She is optimistic about the evaluation. Neysa Bonito again denies any needs from the psychosocial eval. She states she has a therapist she sees and is compliant with taking her psychotropic meds. Neysa Bonito is still being worked up for a possible lung transplant. She is currently going to [facility] for procedures. She is optimistic about the evaluation. Neysa Bonito again denies any new psychosocial barriers or concerns. She states she has a therapist and is compliant with taking her psychotropic meds. Neysa Bonito is still being worked up for a possible lung transplant. She is optimistic about the evaluation.   Expected Outcomes For Neysa Bonito to continue participating in  PR free of any psychosocial barriers or concerns For Neysa Bonito to continue participating in PR free of any psychosocial barriers or concerns For Neysa Bonito to continue participating in PR free of any psychosocial barriers or concerns. For Neysa Bonito to continue participating in PR free of any psychosocial barriers or concerns. For Neysa Bonito to continue participating in PR free of any psychosocial barriers or concerns.   Interventions Encouraged to attend Pulmonary Rehabilitation for the exercise Encouraged to attend Pulmonary Rehabilitation for the exercise Encouraged to attend Pulmonary Rehabilitation for the exercise Encouraged to attend Pulmonary Rehabilitation for the exercise Encouraged to attend Pulmonary Rehabilitation for the exercise   Continue Psychosocial Services  No Follow up required No Follow up required No Follow up required No Follow up required No Follow up required            Psychosocial Discharge (Final Psychosocial Re-Evaluation):  Psychosocial Re-Evaluation - 12/17/22 1122       Psychosocial Re-Evaluation   Current issues with None Identified    Comments Neysa Bonito again denies any new psychosocial barriers or concerns. She states she has a therapist and is compliant with taking her psychotropic meds. Neysa Bonito is still being worked  up for a possible lung transplant. She is optimistic about the evaluation.    Expected Outcomes For Neysa Bonito to continue participating in PR free of any psychosocial barriers or concerns.    Interventions Encouraged to attend Pulmonary Rehabilitation for the exercise    Continue Psychosocial Services  No Follow up required             Education: Education Goals: Education classes will be provided on a weekly basis, covering required topics. Participant will state understanding/return demonstration of topics presented.  Learning Barriers/Preferences:  Learning Barriers/Preferences - 08/15/22 0920       Learning Barriers/Preferences   Learning  Barriers Sight   wears glasses   Learning Preferences Group Instruction;Individual Instruction;Skilled Demonstration             Education Topics: Know Your Numbers Group instruction that is supported by a PowerPoint presentation. Instructor discusses importance of knowing and understanding resting, exercise, and post-exercise oxygen saturation, heart rate, and blood pressure. Oxygen saturation, heart rate, blood pressure, rating of perceived exertion, and dyspnea are reviewed along with a normal range for these values.  Flowsheet Row PULMONARY REHAB OTHER RESPIRATORY from 11/29/2022 in [facility] for Heart, Vascular, & Lung Health  Date 11/29/22  Educator EP  Instruction Review Code 1- Verbalizes Understanding       Exercise for the Pulmonary Patient Group instruction that is supported by a PowerPoint presentation. Instructor discusses benefits of exercise, core components of exercise, frequency, duration, and intensity of an exercise routine, importance of utilizing pulse oximetry during exercise, safety while exercising, and options of places to exercise outside of rehab.  Flowsheet Row PULMONARY REHAB OTHER RESPIRATORY from 08/23/2022 in [facility] for Heart, Vascular, & Lung Health  Date 08/23/22  Educator EP  Instruction Review Code 1- Verbalizes Understanding       MET Level  Group instruction provided by PowerPoint, verbal discussion, and written material to support subject matter. Instructor reviews what METs are and how to increase METs.  Flowsheet Row PULMONARY REHAB OTHER RESPIRATORY from 10/25/2022 in [facility] for Heart, Vascular, & Lung Health  Date 10/25/22  Educator EP  Instruction Review Code 1- Verbalizes Understanding       Pulmonary Medications Verbally interactive group education provided by instructor with focus on inhaled medications and proper administration. Flowsheet Row  PULMONARY REHAB OTHER RESPIRATORY from 11/15/2022 in [facility] for Heart, Vascular, & Lung Health  Date 11/15/22  Educator RT  Instruction Review Code 1- Verbalizes Understanding       Anatomy and Physiology of the Respiratory System Group instruction provided by PowerPoint, verbal discussion, and written material to support subject matter. Instructor reviews respiratory cycle and anatomical components of the respiratory system and their functions. Instructor also reviews differences in obstructive and restrictive respiratory diseases with examples of each.  Flowsheet Row PULMONARY REHAB OTHER RESPIRATORY from 11/08/2022 in [facility] for Heart, Vascular, & Lung Health  Date 11/08/22  Educator RT  Instruction Review Code 1- Verbalizes Understanding       Oxygen Safety Group instruction provided by PowerPoint, verbal discussion, and written material to support subject matter. There is an overview of "What is Oxygen" and "Why do we need it".  Instructor also reviews how to create a safe environment for oxygen use, the importance of using oxygen as prescribed, and the risks of noncompliance. There is a brief discussion on traveling with oxygen and resources the  patient may utilize. Flowsheet Row PULMONARY REHAB OTHER RESPIRATORY from 12/06/2022 in [facility] for Heart, Vascular, & Lung Health  Date 12/06/22  Educator RN  Instruction Review Code 1- Verbalizes Understanding       Oxygen Use Group instruction provided by PowerPoint, verbal discussion, and written material to discuss how supplemental oxygen is prescribed and different types of oxygen supply systems. Resources for more information are provided.  Flowsheet Row PULMONARY REHAB OTHER RESPIRATORY from 12/13/2022 in [facility] for Heart, Vascular, & Lung Health  Date 12/13/22  Educator RT  Instruction Review Code 1- Verbalizes  Understanding       Breathing Techniques Group instruction that is supported by demonstration and informational handouts. Instructor discusses the benefits of pursed lip and diaphragmatic breathing and detailed demonstration on how to perform both.  Flowsheet Row PULMONARY REHAB OTHER RESPIRATORY from 12/20/2022 in [facility] for Heart, Vascular, & Lung Health  Date 12/20/22  Educator RN  Instruction Review Code 1- Verbalizes Understanding        Risk Factor Reduction Group instruction that is supported by a PowerPoint presentation. Instructor discusses the definition of a risk factor, different risk factors for pulmonary disease, and how the heart and lungs work together. Flowsheet Row PULMONARY REHAB OTHER RESPIRATORY from 10/18/2022 in [facility] for Heart, Vascular, & Lung Health  Date 10/18/22  Educator EP  Instruction Review Code 1- Verbalizes Understanding       Pulmonary Diseases Group instruction provided by PowerPoint, verbal discussion, and written material to support subject matter. Instructor gives an overview of the different type of pulmonary diseases. There is also a discussion on risk factors and symptoms as well as ways to manage the diseases.   Stress and Energy Conservation Group instruction provided by PowerPoint, verbal discussion, and written material to support subject matter. Instructor gives an overview of stress and the impact it can have on the body. Instructor also reviews ways to reduce stress. There is also a discussion on energy conservation and ways to conserve energy throughout the day.   Warning Signs and Symptoms Group instruction provided by PowerPoint, verbal discussion, and written material to support subject matter. Instructor reviews warning signs and symptoms of stroke, heart attack, cold and flu. Instructor also reviews ways to prevent the spread of infection.   Other Education Group or  individual verbal, written, or video instructions that support the educational goals of the pulmonary rehab program. Flowsheet Row PULMONARY REHAB OTHER RESPIRATORY from 11/01/2022 in [facility] for Heart, Vascular, & Lung Health  Date 11/01/22  Educator RT  Instruction Review Code 1- Verbalizes Understanding        Knowledge Questionnaire Score:  Knowledge Questionnaire Score - 08/15/22 0929       Knowledge Questionnaire Score   Pre Score 18/18             Core Components/Risk Factors/Patient Goals at Admission:  Personal Goals and Risk Factors at Admission - 08/15/22 0920       Core Components/Risk Factors/Patient Goals on Admission    Weight Management Yes;Weight Loss    Intervention Weight Management: Develop a combined nutrition and exercise program designed to reach desired caloric intake, while maintaining appropriate intake of nutrient and fiber, sodium and fats, and appropriate energy expenditure required for the weight goal.;Weight Management: Provide education and appropriate resources to help participant work on and attain dietary goals.;Weight Management/Obesity: Establish reasonable short term and  long term weight goals.;Obesity: Provide education and appropriate resources to help participant work on and attain dietary goals.    Expected Outcomes Short Term: Continue to assess and modify interventions until short term weight is achieved;Long Term: Adherence to nutrition and physical activity/exercise program aimed toward attainment of established weight goal;Weight Maintenance: Understanding of the daily nutrition guidelines, which includes 25-35% calories from fat, 7% or less cal from saturated fats, less than 200mg  cholesterol, less than 1.5gm of sodium, & 5 or more servings of fruits and vegetables daily;Weight Loss: Understanding of general recommendations for a balanced deficit meal plan, which promotes 1-2 lb weight loss per week and includes a  negative energy balance of [phone] kcal/d;Understanding recommendations for meals to include 15-35% energy as protein, 25-35% energy from fat, 35-60% energy from carbohydrates, less than 200mg  of dietary cholesterol, 20-35 gm of total fiber daily;Understanding of distribution of calorie intake throughout the day with the consumption of 4-5 meals/snacks    Improve shortness of breath with ADL's Yes    Intervention Provide education, individualized exercise plan and daily activity instruction to help decrease symptoms of SOB with activities of daily living.    Expected Outcomes Short Term: Improve cardiorespiratory fitness to achieve a reduction of symptoms when performing ADLs;Long Term: Be able to perform more ADLs without symptoms or delay the onset of symptoms             Core Components/Risk Factors/Patient Goals Review:   Goals and Risk Factor Review     Row Name 09/03/22 0924 09/28/22 0913 10/22/22 1512 11/19/22 0915 12/17/22 1124     Core Components/Risk Factors/Patient Goals Review   Personal Goals Review Weight Management/Obesity;Improve shortness of breath with ADL's Weight Management/Obesity;Improve shortness of breath with ADL's Weight Management/Obesity;Improve shortness of breath with ADL's Weight Management/Obesity;Improve shortness of breath with ADL's Weight Management/Obesity;Improve shortness of breath with ADL's   Review Neysa Bonito has recently started the program. She is working with staff dietician to achieve her weight loss goals. She is able to demonstrate purse lip breathing when she gets short of breath. She still gets short of breath when performing her ADL's. Neysa Bonito is enjoying the program so far. We will continue to monitor her progress. Goal progressing for weight loss. Neysa Bonito has been working with our dietician for weight loss goals. Goal progressing on improving her shortness of breath with ADLs. Neysa Bonito is being worked up for lung transplant and remains optimistic  through this process. We will continue to monitor her progress throughout the program. Goal in progress to lose weight and make healthy choices. Neysa Bonito is working with our dietician on healthy eating habits and less snacking. Per [facility] lung transplant team recommendations, Neysa Bonito will need to lose weight before surgery. Our dietician has recently suggested a low calorie 14-day plan. Neysa Bonito was excited to see that she lost weight last week. Hopefully this will jump start her weight loss journey and give her the confidence that she can lose weight. Goal in progress on improving her shortness of breath with ADLs. Neysa Bonito is maintaining her oxygen saturation of 88% or higher on 8-10L with exertion. She is able to titrate down to 2L at rest. Neysa Bonito will continue to benefit from PR for nutrition, education, exercise, and lifestyle modification. Goal in progress to lose weight. Neysa Bonito is working with our dietician on healthy eating habits and less snacking. Per [facility] lung transplant team recommendations, Neysa Bonito will need to lose weight before surgery. She is currently down ~8#. Goal in progress on improving her  shortness of breath with ADLs. Neysa Bonito is maintaining her oxygen saturation of 88% or higher on 8-10L with exertion. She has increased both his workload and METs while maintain on her current oxygen prescription. Neysa Bonito will continue to benefit from PR for nutrition, education, exercise, and lifestyle modification. Goal in progress to lose weight. Neysa Bonito is working with our dietician to achieve her weight loss goals. Per [facility] lung transplant team recommendations, Neysa Bonito will need to lose weight before surgery. She is currently down 10#. Goal in progress on improving her shortness of breath with ADLs. Neysa Bonito is maintaining her oxygen saturation of 88% or higher on 8-10L with exertion. She has increased both his workload and METs while maintain on her current oxygen prescription. Neysa Bonito will continue to  benefit from PR for nutrition, education, exercise, and lifestyle modification.   Expected Outcomes See admission goals. See admission goals. For [name] to lose weight and improve her shortness of breath with ADLs For [name] to lose weight and improve her shortness of breath with ADLs For [name] to lose weight and improve her shortness of breath with ADLs            Core Components/Risk Factors/Patient Goals at Discharge (Final Review):   Goals and Risk Factor Review - 12/17/22 1124       Core Components/Risk Factors/Patient Goals Review   Personal Goals Review Weight Management/Obesity;Improve shortness of breath with ADL's    Review Goal in progress to lose weight. Neysa Bonito is working with our dietician to achieve her weight loss goals. Per [facility] lung transplant team recommendations, Neysa Bonito will need to lose weight before surgery. She is currently down 10#. Goal in progress on improving her shortness of breath with ADLs. Neysa Bonito is maintaining her oxygen saturation of 88% or higher on 8-10L with exertion. She has increased both his workload and METs while maintain on her current oxygen prescription. Neysa Bonito will continue to benefit from PR for nutrition, education, exercise, and lifestyle modification.    Expected Outcomes For [name] to lose weight and improve her shortness of breath with ADLs             ITP Comments:Pt is making expected progress toward Pulmonary Rehab goals after completing 29 session(s). Recommend continued exercise, life style modification, education, and utilization of breathing techniques to increase stamina and strength, while also decreasing shortness of breath with exertion.  Dr. Mechele Collin is Medical Director for Pulmonary Rehab at [facility].

## 2022-12-27 ENCOUNTER — Encounter (HOSPITAL_COMMUNITY)
Admission: RE | Admit: 2022-12-27 | Discharge: 2022-12-27 | Disposition: A | Discharge status: Home / Self Care | Payer: Commercial Managed Care - HMO | Source: Ambulatory Visit | Attending: Pulmonary Disease | Admitting: Pulmonary Disease

## 2022-12-27 DIAGNOSIS — J849 Interstitial pulmonary disease, unspecified: Secondary | ICD-10-CM | POA: Diagnosis not present

## 2022-12-27 NOTE — Progress Notes (Signed)
Daily Session Note  Patient Details  Name: Hannah Klein MRN: [id number] Date of Birth: [birth year] Referring Provider:   Doristine Devoid Pulmonary Rehab Walk Test from 08/15/2022 in [facility] for Heart, Vascular, & Lung Health  Referring Provider Soskis  [sees [name]]       Encounter Date: 12/27/2022  Check In:  Session Check In - 12/27/22 4098       Check-In   Supervising physician immediately available to respond to emergencies [facility] MD immediately available    Physician(s) Carlos Levering, NP    Location [facility]    Staff Present Durel Salts, Patriciaann Clan, RN, Doris Cheadle, MS, ACSM-CEP, Exercise Physiologist    Virtual Visit No    Medication changes reported     No    Fall or balance concerns reported    No    Tobacco Cessation No Change    Warm-up and Cool-down Performed as group-led instruction    Resistance Training Performed Yes    VAD Patient? No    PAD/SET Patient? No      Pain Assessment   Currently in Pain? No/denies    Pain Score 0-No pain    Multiple Pain Sites No             Capillary Blood Glucose: No results found for this or any previous visit (from the past 24 hour(s)).    Social History   Tobacco Use  Smoking Status Former   Current packs/day: 0.00   Average packs/day: 2.0 packs/day for 18.0 years (36.1 ttl pk-yrs)   Types: Cigarettes   Start date: 23   Quit date: 03/16/2007   Years since quitting: 15.7   Passive exposure: Current  Smokeless Tobacco Never    Goals Met:  Proper associated with RPD/PD & O2 Sat Independence with exercise equipment Exercise tolerated well No report of concerns or symptoms today Strength training completed today  Goals Unmet:  Not Applicable  Comments: Service time is from 0812 to 0926.    Dr. Mechele Collin is Medical Director for Pulmonary Rehab at [facility].

## 2023-01-01 ENCOUNTER — Encounter (HOSPITAL_COMMUNITY)
Admission: RE | Admit: 2023-01-01 | Discharge: 2023-01-01 | Disposition: A | Discharge status: Home / Self Care | Payer: Commercial Managed Care - HMO | Source: Ambulatory Visit | Attending: Pulmonary Disease | Admitting: Pulmonary Disease

## 2023-01-01 DIAGNOSIS — J849 Interstitial pulmonary disease, unspecified: Secondary | ICD-10-CM | POA: Insufficient documentation

## 2023-01-01 DIAGNOSIS — Z7722 Contact with and (suspected) exposure to environmental tobacco smoke (acute) (chronic): Secondary | ICD-10-CM | POA: Diagnosis not present

## 2023-01-01 DIAGNOSIS — Z5189 Encounter for other specified aftercare: Secondary | ICD-10-CM | POA: Insufficient documentation

## 2023-01-01 DIAGNOSIS — Z87891 Personal history of nicotine dependence: Secondary | ICD-10-CM | POA: Diagnosis not present

## 2023-01-01 NOTE — Progress Notes (Signed)
Daily Session Note  Patient Details  Name: Hannah Klein MRN: [id number] Date of Birth: [birth year] Referring Provider:   Doristine Devoid Pulmonary Rehab Walk Test from 08/15/2022 in [facility] for Heart, Vascular, & Lung Health  Referring Provider Soskis  [sees [name]]       Encounter Date: 01/01/2023  Check In:  Session Check In - 01/01/23 0824       Check-In   Supervising physician immediately available to respond to emergencies [facility] MD immediately available    Physician(s) Carlos Levering, NP    Location [facility]    Staff Present Durel Salts, Patriciaann Clan, RN, Doris Cheadle, MS, ACSM-CEP, Exercise Physiologist;Randi Dionisio Paschal, ACSM-CEP, Exercise Physiologist    Virtual Visit No    Medication changes reported     No    Fall or balance concerns reported    No    Tobacco Cessation No Change    Warm-up and Cool-down Performed as group-led instruction    Resistance Training Performed Yes    VAD Patient? No    PAD/SET Patient? No      Pain Assessment   Currently in Pain? No/denies    Multiple Pain Sites No             Capillary Blood Glucose: No results found for this or any previous visit (from the past 24 hour(s)).    Social History   Tobacco Use  Smoking Status Former   Current packs/day: 0.00   Average packs/day: 2.0 packs/day for 18.0 years (36.1 ttl pk-yrs)   Types: Cigarettes   Start date: 20   Quit date: 03/16/2007   Years since quitting: 15.8   Passive exposure: Current  Smokeless Tobacco Never    Goals Met:  Proper associated with RPD/PD & O2 Sat Independence with exercise equipment Exercise tolerated well No report of concerns or symptoms today Strength training completed today  Goals Unmet:  Not Applicable  Comments: Service time is from 0811 to 0915.    Dr. Mechele Collin is Medical Director for Pulmonary Rehab at [facility].

## 2023-01-03 ENCOUNTER — Encounter (HOSPITAL_COMMUNITY)
Admission: RE | Admit: 2023-01-03 | Discharge: 2023-01-03 | Disposition: A | Discharge status: Home / Self Care | Payer: Commercial Managed Care - HMO | Source: Ambulatory Visit | Attending: Pulmonary Disease | Admitting: Pulmonary Disease

## 2023-01-03 DIAGNOSIS — J849 Interstitial pulmonary disease, unspecified: Secondary | ICD-10-CM

## 2023-01-03 DIAGNOSIS — Z87891 Personal history of nicotine dependence: Secondary | ICD-10-CM | POA: Diagnosis not present

## 2023-01-03 DIAGNOSIS — Z7722 Contact with and (suspected) exposure to environmental tobacco smoke (acute) (chronic): Secondary | ICD-10-CM | POA: Diagnosis not present

## 2023-01-03 DIAGNOSIS — Z5189 Encounter for other specified aftercare: Secondary | ICD-10-CM | POA: Diagnosis not present

## 2023-01-03 NOTE — Progress Notes (Signed)
Daily Session Note  Patient Details  Name: Hannah Klein MRN: [id number] Date of Birth: June 12, 1975 Referring Provider:   Doristine Devoid Pulmonary Rehab Walk Test from 08/15/2022 in [facility] for Heart, Vascular, & Lung Health  Referring Provider Soskis  [sees [name]]       Encounter Date: 01/03/2023  Check In:  Session Check In - 01/03/23 0804       Check-In   Supervising physician immediately available to respond to emergencies [facility] MD immediately available    Physician(s) Robin Searing, NP    [facility]    Staff Present Durel Salts, Patriciaann Clan, RN, Doris Cheadle, MS, ACSM-CEP, Exercise Physiologist;Marthann Abshier Dionisio Paschal, ACSM-CEP, Exercise Physiologist    Virtual Visit No    Medication changes reported     No    Fall or balance concerns reported    No    Tobacco Cessation No Change    Warm-up and Cool-down Performed as group-led instruction    Resistance Training Performed Yes    VAD Patient? No    PAD/SET Patient? No      Pain Assessment   Currently in Pain? No/denies    Multiple Pain Sites No             Capillary Blood Glucose: No results found for this or any previous visit (from the past 24 hour(s)).    Social History   Tobacco Use  Smoking Status Former   Current packs/day: 0.00   Average packs/day: 2.0 packs/day for 18.0 years (36.1 ttl pk-yrs)   Types: Cigarettes   Start date: 32   Quit date: 03/16/2007   Years since quitting: 15.8   Passive exposure: Current  Smokeless Tobacco Never    Goals Met:  Independence with exercise equipment Achieving weight loss Exercise tolerated well No report of concerns or symptoms today Strength training completed today  Goals Unmet:  Not Applicable  Comments: Service time is from 0802 to 0925.    Dr. Mechele Collin is Medical Director for Pulmonary Rehab at [facility].

## 2023-01-08 ENCOUNTER — Encounter (HOSPITAL_COMMUNITY)
Admission: RE | Admit: 2023-01-08 | Discharge: 2023-01-08 | Disposition: A | Discharge status: Home / Self Care | Payer: Commercial Managed Care - HMO | Source: Ambulatory Visit | Attending: Pulmonary Disease | Admitting: Pulmonary Disease

## 2023-01-08 VITALS — Wt 221.8 lb

## 2023-01-08 DIAGNOSIS — J849 Interstitial pulmonary disease, unspecified: Secondary | ICD-10-CM

## 2023-01-08 DIAGNOSIS — Z5189 Encounter for other specified aftercare: Secondary | ICD-10-CM | POA: Diagnosis not present

## 2023-01-08 DIAGNOSIS — Z87891 Personal history of nicotine dependence: Secondary | ICD-10-CM | POA: Diagnosis not present

## 2023-01-08 DIAGNOSIS — Z7722 Contact with and (suspected) exposure to environmental tobacco smoke (acute) (chronic): Secondary | ICD-10-CM | POA: Diagnosis not present

## 2023-01-08 NOTE — Progress Notes (Signed)
Daily Session Note  Patient Details  Name: Hannah Klein MRN: [id number] Date of Birth: Mar 04, 1975 Referring Provider:   Doristine Devoid Pulmonary Rehab Walk Test from 08/15/2022 in [facility] for Heart, Vascular, & Lung Health  Referring Provider Soskis  [sees [name]]       Encounter Date: 01/08/2023  Check In:  Session Check In - 01/08/23 0813       Check-In   Supervising physician immediately available to respond to emergencies [facility] MD immediately available    Physician(s) Joni Reining, NP    [facility]    Staff Present Durel Salts, Patriciaann Clan, RN, Doris Cheadle, MS, ACSM-CEP, Exercise Physiologist    Virtual Visit No    Medication changes reported     No    Fall or balance concerns reported    No    Tobacco Cessation No Change    Warm-up and Cool-down Performed as group-led instruction    Resistance Training Performed Yes    VAD Patient? No    PAD/SET Patient? No      Pain Assessment   Currently in Pain? No/denies    Multiple Pain Sites No             Capillary Blood Glucose: No results found for this or any previous visit (from the past 24 hour(s)).   Exercise Prescription Changes - 01/08/23 0900       Response to Exercise   Blood Pressure (Admit) 118/64    Blood Pressure (Exercise) 150/64    Blood Pressure (Exit) 112/58    Heart Rate (Admit) 78 bpm    Heart Rate (Exercise) 100 bpm    Heart Rate (Exit) 95 bpm    Oxygen Saturation (Admit) 98 %    Oxygen Saturation (Exercise) 93 %    Oxygen Saturation (Exit) 96 %    Rating of Perceived Exertion (Exercise) 11    Perceived Dyspnea (Exercise) 1    Duration Continue with 30 min of aerobic exercise without signs/symptoms of physical distress.    Intensity THRR unchanged      Progression   Progression Continue to progress workloads to maintain intensity without signs/symptoms of physical distress.      Resistance Training   Training  Prescription Yes    Weight black bands    Reps 10-15    Time 10 Minutes      Interval Training   Interval Training Yes    Equipment Treadmill    Comments 1 min @ 2. @ + 1      Oxygen   Oxygen Continuous    Liters 1-6      Treadmill   MPH 3    Grade 1    Minutes 15    METs 3.5      Recumbant Elliptical   Level 5    Minutes 15    METs 3.5      Oxygen   Maintain Oxygen Saturation 88% or higher             Social History   Tobacco Use  Smoking Status Former   Current packs/day: 0.00   Average packs/day: 2.0 packs/day for 18.0 years (36.1 ttl pk-yrs)   Types: Cigarettes   Start date: 36   Quit date: 03/16/2007   Years since quitting: 15.8   Passive exposure: Current  Smokeless Tobacco Never    Goals Met:  Proper associated with RPD/PD & O2 Sat Independence with exercise equipment Exercise tolerated well No  report of concerns or symptoms today Strength training completed today  Goals Unmet:  Not Applicable  Comments: Service time is from 0805 to 0907.    Dr. Mechele Collin is Medical Director for Pulmonary Rehab at [facility].

## 2023-01-10 ENCOUNTER — Encounter (HOSPITAL_COMMUNITY)
Admission: RE | Admit: 2023-01-10 | Discharge: 2023-01-10 | Disposition: A | Discharge status: Home / Self Care | Payer: Commercial Managed Care - HMO | Source: Ambulatory Visit | Attending: Pulmonary Disease | Admitting: Pulmonary Disease

## 2023-01-10 DIAGNOSIS — J849 Interstitial pulmonary disease, unspecified: Secondary | ICD-10-CM | POA: Diagnosis not present

## 2023-01-10 DIAGNOSIS — Z7722 Contact with and (suspected) exposure to environmental tobacco smoke (acute) (chronic): Secondary | ICD-10-CM | POA: Diagnosis not present

## 2023-01-10 DIAGNOSIS — Z87891 Personal history of nicotine dependence: Secondary | ICD-10-CM | POA: Diagnosis not present

## 2023-01-10 DIAGNOSIS — Z5189 Encounter for other specified aftercare: Secondary | ICD-10-CM | POA: Diagnosis not present

## 2023-01-10 NOTE — Progress Notes (Signed)
Daily Session Note  Patient Details  Name: Hannah Klein MRN: [id number] Date of Birth: Oct 11, 1975 Referring Provider:   Doristine Devoid Pulmonary Rehab Walk Test from 08/15/2022 in [facility] for Heart, Vascular, & Lung Health  Referring Provider Soskis  [sees [name]]       Encounter Date: 01/10/2023  Check In:  Session Check In - 01/10/23 2952       Check-In   Supervising physician immediately available to respond to emergencies [facility] MD immediately available    Physician(s) Bernadene Person, NP    [facility]    Staff Present Durel Salts, Patriciaann Clan, RN, Doris Cheadle, MS, ACSM-CEP, Exercise Physiologist;Randi Dionisio Paschal, ACSM-CEP, Exercise Physiologist    Virtual Visit No    Medication changes reported     No    Fall or balance concerns reported    No    Tobacco Cessation No Change    Warm-up and Cool-down Performed as group-led instruction    Resistance Training Performed Yes    VAD Patient? No    PAD/SET Patient? No      Pain Assessment   Currently in Pain? No/denies    Multiple Pain Sites No             Capillary Blood Glucose: No results found for this or any previous visit (from the past 24 hour(s)).    Social History   Tobacco Use  Smoking Status Former   Current packs/day: 0.00   Average packs/day: 2.0 packs/day for 18.0 years (36.1 ttl pk-yrs)   Types: Cigarettes   Start date: 75   Quit date: 03/16/2007   Years since quitting: 15.8   Passive exposure: Current  Smokeless Tobacco Never    Goals Met:  Proper associated with RPD/PD & O2 Sat Independence with exercise equipment Exercise tolerated well No report of concerns or symptoms today Strength training completed today  Goals Unmet:  Not Applicable  Comments: Service time is from 0811 to [id number].    Dr. Mechele Collin is Medical Director for Pulmonary Rehab at [facility].

## 2023-01-14 ENCOUNTER — Telehealth: Payer: Self-pay | Admitting: Primary Care

## 2023-01-14 DIAGNOSIS — Z79899 Other long term (current) drug therapy: Secondary | ICD-10-CM

## 2023-01-14 NOTE — Telephone Encounter (Signed)
Of course, no problem! I will respond to her MyChart message for labs needed. Okay to schedule lab appointment.

## 2023-01-14 NOTE — Telephone Encounter (Signed)
Patient called in stating that she has lab orders from [facility] that she would like to come to our lab and have drawn.I do not see that the orders have been placed,but patient said that she has a paper with the orders on them. Is it okay to schedule her a lab appointment? I checked with lab,and they did advise that Jae Dire would have to place the orders. Please advise

## 2023-01-15 ENCOUNTER — Encounter (HOSPITAL_COMMUNITY)
Admission: RE | Admit: 2023-01-15 | Discharge: 2023-01-15 | Disposition: A | Discharge status: Home / Self Care | Payer: Commercial Managed Care - HMO | Source: Ambulatory Visit | Attending: Pulmonary Disease | Admitting: Pulmonary Disease

## 2023-01-15 DIAGNOSIS — J849 Interstitial pulmonary disease, unspecified: Secondary | ICD-10-CM | POA: Diagnosis not present

## 2023-01-15 DIAGNOSIS — Z7722 Contact with and (suspected) exposure to environmental tobacco smoke (acute) (chronic): Secondary | ICD-10-CM | POA: Diagnosis not present

## 2023-01-15 DIAGNOSIS — Z87891 Personal history of nicotine dependence: Secondary | ICD-10-CM | POA: Diagnosis not present

## 2023-01-15 DIAGNOSIS — Z5189 Encounter for other specified aftercare: Secondary | ICD-10-CM | POA: Diagnosis not present

## 2023-01-15 NOTE — Progress Notes (Signed)
Daily Session Note  Patient Details  Name: Hannah Klein MRN: [id number] Date of Birth: Mar 25, 1975 Referring Provider:   Doristine Devoid Pulmonary Rehab Walk Test from 08/15/2022 in [facility] for Heart, Vascular, & Lung Health  Referring Provider Soskis  [sees [name]]       Encounter Date: 01/15/2023  Check In:  Session Check In - 01/15/23 0813       Check-In   Supervising physician immediately available to respond to emergencies [facility] MD immediately available    Physician(s) Bernadene Person, NP    [facility]    Staff Present Durel Salts, Patriciaann Clan, RN, Doris Cheadle, MS, ACSM-CEP, Exercise Physiologist    Virtual Visit No    Medication changes reported     Yes    Comments Pt started Azathioprine    Fall or balance concerns reported    No    Tobacco Cessation No Change    Warm-up and Cool-down Performed as group-led instruction    Resistance Training Performed Yes    VAD Patient? No    PAD/SET Patient? No      Pain Assessment   Currently in Pain? No/denies    Multiple Pain Sites No             Capillary Blood Glucose: No results found for this or any previous visit (from the past 24 hour(s)).    Social History   Tobacco Use  Smoking Status Former   Current packs/day: 0.00   Average packs/day: 2.0 packs/day for 18.0 years (36.1 ttl pk-yrs)   Types: Cigarettes   Start date: 7   Quit date: 03/16/2007   Years since quitting: 15.8   Passive exposure: Current  Smokeless Tobacco Never    Goals Met:  Proper associated with RPD/PD & O2 Sat Independence with exercise equipment Exercise tolerated well No report of concerns or symptoms today Strength training completed today  Goals Unmet:  Not Applicable  Comments: Service time is from 0809 to 0911.    Dr. Mechele Collin is Medical Director for Pulmonary Rehab at [facility].

## 2023-01-15 NOTE — Telephone Encounter (Signed)
Lvm for patient tcb and schedule 

## 2023-01-17 ENCOUNTER — Encounter (HOSPITAL_COMMUNITY)
Admission: RE | Admit: 2023-01-17 | Discharge: 2023-01-17 | Disposition: A | Discharge status: Home / Self Care | Payer: Commercial Managed Care - HMO | Source: Ambulatory Visit | Attending: Pulmonary Disease | Admitting: Pulmonary Disease

## 2023-01-17 DIAGNOSIS — Z5189 Encounter for other specified aftercare: Secondary | ICD-10-CM | POA: Diagnosis not present

## 2023-01-17 DIAGNOSIS — Z7722 Contact with and (suspected) exposure to environmental tobacco smoke (acute) (chronic): Secondary | ICD-10-CM | POA: Diagnosis not present

## 2023-01-17 DIAGNOSIS — Z87891 Personal history of nicotine dependence: Secondary | ICD-10-CM | POA: Diagnosis not present

## 2023-01-17 DIAGNOSIS — J849 Interstitial pulmonary disease, unspecified: Secondary | ICD-10-CM

## 2023-01-17 NOTE — Progress Notes (Signed)
Daily Session Note  Patient Details  Name: Hannah Klein MRN: [id number] Date of Birth: 03/03/1975 Referring Provider:   Doristine Devoid Pulmonary Rehab Walk Test from 08/15/2022 in [facility] for Heart, Vascular, & Lung Health  Referring Provider Soskis  [sees [name]]       Encounter Date: 01/17/2023  Check In:  Session Check In - 01/17/23 4098       Check-In   Supervising physician immediately available to respond to emergencies [facility] MD immediately available    Physician(s) Reather Littler, NP    [facility]    Staff Present Durel Salts, Patriciaann Clan, RN, Doris Cheadle, MS, ACSM-CEP, Exercise Physiologist;Randi Idelle Crouch BS, ACSM-CEP, Exercise Physiologist;Samantha Belarus, RD, LDN    Virtual Visit No    Medication changes reported     No    Fall or balance concerns reported    No    Tobacco Cessation No Change    Warm-up and Cool-down Performed as group-led instruction    Resistance Training Performed Yes    VAD Patient? No    PAD/SET Patient? No      Pain Assessment   Currently in Pain? No/denies    Multiple Pain Sites No             Capillary Blood Glucose: No results found for this or any previous visit (from the past 24 hour(s)).    Social History   Tobacco Use  Smoking Status Former   Current packs/day: 0.00   Average packs/day: 2.0 packs/day for 18.0 years (36.1 ttl pk-yrs)   Types: Cigarettes   Start date: 24   Quit date: 03/16/2007   Years since quitting: 15.8   Passive exposure: Current  Smokeless Tobacco Never    Goals Met:  Proper associated with RPD/PD & O2 Sat Independence with exercise equipment Exercise tolerated well No report of concerns or symptoms today Strength training completed today  Goals Unmet:  Not Applicable  Comments: Service time is from 0810 to 0925.    Dr. Mechele Collin is Medical Director for Pulmonary Rehab at [facility].

## 2023-01-22 ENCOUNTER — Encounter (HOSPITAL_COMMUNITY)
Admission: RE | Admit: 2023-01-22 | Discharge: 2023-01-22 | Disposition: A | Discharge status: Home / Self Care | Payer: Commercial Managed Care - HMO | Source: Ambulatory Visit | Attending: Pulmonary Disease

## 2023-01-22 VITALS — Wt 225.1 lb

## 2023-01-22 DIAGNOSIS — Z87891 Personal history of nicotine dependence: Secondary | ICD-10-CM | POA: Diagnosis not present

## 2023-01-22 DIAGNOSIS — Z5189 Encounter for other specified aftercare: Secondary | ICD-10-CM | POA: Diagnosis not present

## 2023-01-22 DIAGNOSIS — J849 Interstitial pulmonary disease, unspecified: Secondary | ICD-10-CM | POA: Diagnosis not present

## 2023-01-22 DIAGNOSIS — Z7722 Contact with and (suspected) exposure to environmental tobacco smoke (acute) (chronic): Secondary | ICD-10-CM | POA: Diagnosis not present

## 2023-01-22 NOTE — Progress Notes (Signed)
Daily Session Note  Patient Details  Name: Hannah Klein MRN: [id number] Date of Birth: 22-Apr-1975 Referring Provider:   Doristine Devoid Pulmonary Rehab Walk Test from 08/15/2022 in [facility] for Heart, Vascular, & Lung Health  Referring Provider Soskis  [sees [name]]       Encounter Date: 01/22/2023  Check In:  Session Check In - 01/22/23 0816       Check-In   Supervising physician immediately available to respond to emergencies [facility] MD immediately available    Physician(s) Jari Favre, NP    Location [facility]    Staff Present Durel Salts, Patriciaann Clan, RN, Doris Cheadle, MS, ACSM-CEP, Exercise Physiologist;Randi Idelle Crouch BS, ACSM-CEP, Exercise Physiologist    Virtual Visit No    Medication changes reported     No    Fall or balance concerns reported    No    Tobacco Cessation No Change    Warm-up and Cool-down Performed as group-led instruction    Resistance Training Performed Yes    VAD Patient? No    PAD/SET Patient? No      Pain Assessment   Currently in Pain? No/denies    Multiple Pain Sites No             Capillary Blood Glucose: No results found for this or any previous visit (from the past 24 hour(s)).   Exercise Prescription Changes - 01/22/23 0900       Response to Exercise   Blood Pressure (Admit) 112/64    Blood Pressure (Exercise) 136/66    Blood Pressure (Exit) 114/62    Heart Rate (Admit) 65 bpm    Heart Rate (Exercise) 101 bpm    Heart Rate (Exit) 74 bpm    Oxygen Saturation (Admit) 97 %    Oxygen Saturation (Exercise) 90 %    Oxygen Saturation (Exit) 95 %    Rating of Perceived Exertion (Exercise) 12    Perceived Dyspnea (Exercise) 1.5    Duration Continue with 30 min of aerobic exercise without signs/symptoms of physical distress.    Intensity THRR unchanged      Progression   Progression Continue to progress workloads to maintain intensity without signs/symptoms of physical distress.       Resistance Training   Training Prescription Yes    Weight black bands    Reps 10-15    Time 10 Minutes      Interval Training   Interval Training Yes    Equipment Treadmill    Comments 1 min @ 3.2 mph and 1.5% incline, 1 min @ 2.2 mph and 0% incline      Oxygen   Oxygen Continuous    Liters 1-6      Treadmill   MPH 3.2    Grade 1.5    Minutes 15    METs 3.9      Recumbant Elliptical   Level 5    Minutes 15    METs 3.3      Oxygen   Maintain Oxygen Saturation 88% or higher             Social History   Tobacco Use  Smoking Status Former   Current packs/day: 0.00   Average packs/day: 2.0 packs/day for 18.0 years (36.1 ttl pk-yrs)   Types: Cigarettes   Start date: 38   Quit date: 03/16/2007   Years since quitting: 15.8   Passive exposure: Current  Smokeless Tobacco Never    Goals Met:  Proper associated  with RPD/PD & O2 Sat Independence with exercise equipment Exercise tolerated well No report of concerns or symptoms today Strength training completed today  Goals Unmet:  Not Applicable  Comments: Service time is from 0809 to 0920.    Dr. Mechele Collin is Medical Director for Pulmonary Rehab at [facility].

## 2023-01-23 NOTE — Progress Notes (Signed)
Pulmonary Individual Treatment Plan  Patient Details  Name: Hannah Klein MRN: [id number] Date of Birth: 06/07/1975 Referring Provider:   Doristine Devoid Pulmonary Rehab Walk Test from 08/15/2022 in [facility]  Referring Provider Soskis  [sees [name]]       Initial Encounter Date:  Flowsheet Row Pulmonary Rehab Walk Test from 08/15/2022 in [facility]  Date 08/15/22       Visit Diagnosis: Interstitial lung disease (HCC)  Patient's Home Medications on Admission:   Current Outpatient Medications:    acetaminophen (TYLENOL) 500 MG tablet, Take 2 tablets (1,000 mg total) by mouth every 6 (six) hours as needed., Disp: 30 tablet, Rfl: 0   albuterol (VENTOLIN HFA) 108 (90 Base) MCG/ACT inhaler, INHALE 1-2 PUFFS BY MOUTH EVERY 6 HOURS AS NEEDED FOR WHEEZE OR SHORTNESS OF BREATH, Disp: 18 g, Rfl: 0   Azelastine HCl 137 MCG/SPRAY SOLN, PLACE 2 SPRAYS INTO BOTH NOSTRILS 2 (TWO) TIMES DAILY. USE IN EACH NOSTRIL AS DIRECTED, Disp: 30 mL, Rfl: 5   B Complex-C (B-COMPLEX WITH VITAMIN C) tablet, Take 1 tablet by mouth daily., Disp: , Rfl:    budesonide (PULMICORT) 0.25 MG/2ML nebulizer solution, Take 0.25 mg by nebulization 2 (two) times daily., Disp: , Rfl:    Cholecalciferol (VITAMIN D3) 125 MCG (5000 UT) TABS, Take 5,000 Units by mouth daily., Disp: , Rfl:    clobetasol ointment (TEMOVATE) 0.05 %, Apply 1 Application topically 2 (two) times daily., Disp: 30 g, Rfl: 0   dapsone 25 MG tablet, TAKE 2 TABLETS BY MOUTH 2 TIMES DAILY., Disp: 360 tablet, Rfl: 1   diclofenac Sodium (VOLTAREN ARTHRITIS PAIN) 1 % GEL, Apply 2 g topically daily as needed (Chest pain)., Disp: , Rfl:    esomeprazole (NEXIUM) 40 MG capsule, TAKE 1 CAPSULE (40 MG TOTAL) BY MOUTH 2 (TWO) TIMES DAILY. FOR HEARTBURN., Disp: 180 capsule, Rfl: 2   famotidine (PEPCID) 40 MG tablet, Take 1 tablet (40 mg total) by  mouth 2 (two) times daily. for heartburn., Disp: 180 tablet, Rfl: 2   formoterol (PERFOROMIST) 20 MCG/2ML nebulizer solution, Take 2 mLs (20 mcg total) by nebulization 2 (two) times daily. (Patient taking differently: Take 20 mcg by nebulization daily as needed (Shortness of breath).), Disp: 120 mL, Rfl: 1   formoterol (PERFOROMIST) 20 MCG/2ML nebulizer solution, Take 20 mcg by nebulization 2 (two) times daily., Disp: , Rfl:    hydrOXYzine (VISTARIL) 50 MG capsule, TAKE 1 CAPSULE BY MOUTH ONCE OR TWICE DAILY AS NEEDED FOR ANXIETY, Disp: 180 capsule, Rfl: 2   levocetirizine (XYZAL) 5 MG tablet, Take 1 tablet (5 mg total) by mouth 2 (two) times daily. For allergies, Disp: 180 tablet, Rfl: 3   mometasone-formoterol (DULERA) 200-5 MCG/ACT AERO, Inhale 2 puffs into the lungs every 12 (twelve) hours., Disp: 13 g, Rfl: 5   Multiple Vitamin (MULTIVITAMIN) capsule, Take 1 capsule by mouth daily., Disp: , Rfl:    mycophenolate (CELLCEPT) 500 MG tablet, Take 3 tablets (1,500 mg total) by mouth 2 (two) times daily., Disp: 120 tablet, Rfl: 3   Omega-3 Fatty Acids (OMEGA-3 FISH OIL) 300 MG CAPS, Take 1,000 mg by mouth., Disp: , Rfl:    ondansetron (ZOFRAN) 4 MG tablet, Take 1 tablet (4 mg total) by mouth every 8 (eight) hours as needed for nausea or vomiting., Disp: 20 tablet, Rfl: 0   oxyCODONE-acetaminophen (PERCOCET) 7.5-325 MG tablet, Take 1 tablet by  mouth every 6 (six) hours as needed for up to 20 doses for severe pain. (Patient not taking: Reported on 11/06/2022), Disp: 20 tablet, Rfl: 0   predniSONE (DELTASONE) 10 MG tablet, TAKE 2 TABLETS BY MOUTH TWICE DAILY, Disp: 120 tablet, Rfl: 1   predniSONE (DELTASONE) 5 MG tablet, Take 1 tablet (5 mg total) by mouth daily with breakfast., Disp: 90 tablet, Rfl: 1   pregabalin (LYRICA) 25 MG capsule, Take 25 mg by mouth 2 (two) times daily., Disp: , Rfl:    rosuvastatin (CRESTOR) 10 MG tablet, TAKE 1 TABLET BY MOUTH EVERY DAY FOR CHOLESTEROL, Disp: 90 tablet, Rfl: 2    sucralfate (CARAFATE) 1 g tablet, Take 1 tablet (1 g total) by mouth in the morning, at noon, and at bedtime., Disp: 90 tablet, Rfl: 5   vitamin B-12 (CYANOCOBALAMIN) 1000 MCG tablet, Take 1,000 mcg by mouth daily., Disp: , Rfl:    vitamin C (ASCORBIC ACID) 500 MG tablet, Take 500 mg by mouth daily., Disp: , Rfl:    vitamin E 180 MG (400 UNITS) capsule, Take 400 Units by mouth daily., Disp: , Rfl:   Past Medical History: Past Medical History:  Diagnosis Date   Arthritis    Asthma    Environmental allergies    Esophageal dysphagia    Generalized anxiety disorder    GERD (gastroesophageal reflux disease)    History of hiatal hernia    Hx of colonic polyps    Hypertension    Lung disorder    Patient states she has two unidentified ILD   Migraines    MRSA (methicillin resistant Staphylococcus aureus)    Lt upper arm  approx 8 years ago   Sleep apnea     Tobacco Use: Social History   Tobacco Use  Smoking Status Former   Current packs/day: 0.00   Average packs/day: 2.0 packs/day for 18.0 years (36.1 ttl pk-yrs)   Types: Cigarettes   Start date: 29   Quit date: 03/16/2007   Years since quitting: 15.8   Passive exposure: Current  Smokeless Tobacco Never    Labs: Review Flowsheet  More data exists      Latest Ref Rng & Units 04/07/2019 07/07/2019 03/17/2020 06/09/2021 05/31/2022  Labs for ITP Cardiac and Pulmonary Rehab  Cholestrol 0 - 200 mg/dL 595  638  756  433  295   LDL (calc) 0 - 99 mg/dL 78  188  94  - -  Direct LDL mg/dL - - - 416.6  063.0   HDL-C >39.00 mg/dL 16.01  09.32  35.57  32.20  66.80   Trlycerides 0.0 - 149.0 mg/dL 254.2  706.2  376.2  831.5  252.0   Hemoglobin A1c 4.6 - 6.5 % - - 5.5  5.6  5.5     Details            Capillary Blood Glucose: No results found for: "GLUCAP"   Pulmonary Assessment Scores:  Pulmonary Assessment Scores     Row Name 08/15/22 0925         ADL UCSD   ADL Phase Entry     SOB Score total 65       CAT Score   CAT  Score 19       mMRC Score   mMRC Score 4             UCSD: Self-administered rating of dyspnea associated with activities of daily living (ADLs) 6-point scale (0 = "not at all" to 5 = "maximal  or unable to do because of breathlessness")  Scoring Scores range from 0 to 120.  Minimally important difference is 5 units  CAT: CAT can identify the health impairment of COPD patients and is better correlated with disease progression.  CAT has a scoring range of zero to 40. The CAT score is classified into four groups of low (less than 10), medium (10 - 20), high (21-30) and very high (31-40) based on the impact level of disease on health status. A CAT score over 10 suggests significant symptoms.  A worsening CAT score could be explained by an exacerbation, poor medication adherence, poor inhaler technique, or progression of COPD or comorbid conditions.  CAT MCID is 2 points  mMRC: mMRC (Modified Medical Research Council) Dyspnea Scale is used to assess the degree of baseline functional disability in patients of respiratory disease due to dyspnea. No minimal important difference is established. A decrease in score of 1 point or greater is considered a positive change.   Pulmonary Function Assessment:  Pulmonary Function Assessment - 08/15/22 0924       Breath   Shortness of Breath Yes;Limiting activity             Exercise Target Goals: Exercise Program Goal: Individual exercise prescription set using results from initial 6 min walk test and THRR while considering  patient's activity barriers and safety.   Exercise Prescription Goal: Initial exercise prescription builds to 30-45 minutes a day of aerobic activity, 2-3 days per week.  Home exercise guidelines will be given to patient during program as part of exercise prescription that the participant will acknowledge.  Activity Barriers & Risk Stratification:  Activity Barriers & Cardiac Risk Stratification - 08/15/22 0927        Activity Barriers & Cardiac Risk Stratification   Activity Barriers Deconditioning;Muscular Weakness;Shortness of Breath;Arthritis;Neck/Spine Problems;Joint Problems    Comments Rt shoulder bursitis and hip pain             6 Minute Walk:  6 Minute Walk     Row Name 08/15/22 1030         6 Minute Walk   Phase Initial     Distance 1170 feet     Walk Time 6 minutes     # of Rest Breaks 2  2:29-2:40, 4:56-5:10     MPH 2.22     METS 3.55     RPE 11     Perceived Dyspnea  2     VO2 Peak 12.44     Symptoms No     Resting HR 87 bpm     Resting BP 104/62     Resting Oxygen Saturation  96 %     Exercise Oxygen Saturation  during 6 min walk 85 %     Max Ex. HR 107 bpm     Max Ex. BP 134/70     2 Minute Post BP 118/64       Interval HR   1 Minute HR 104     2 Minute HR 107     3 Minute HR 102     4 Minute HR 98     5 Minute HR 97     6 Minute HR 99     2 Minute Post HR 79     Interval Heart Rate? Yes       Interval Oxygen   Interval Oxygen? Yes     Baseline Oxygen Saturation % 96 %     1 Minute Oxygen Saturation %  94 %     1 Minute Liters of Oxygen 8 L     2 Minute Oxygen Saturation % 90 %     2 Minute Liters of Oxygen 8 L     3 Minute Oxygen Saturation % 91 %  2:29 85%     3 Minute Liters of Oxygen 8 L  increased to 10L     4 Minute Oxygen Saturation % 95 %     4 Minute Liters of Oxygen 10 L     5 Minute Oxygen Saturation % 98 %     5 Minute Liters of Oxygen 10 L     6 Minute Oxygen Saturation % 94 %     6 Minute Liters of Oxygen 10 L     2 Minute Post Oxygen Saturation % 99 %     2 Minute Post Liters of Oxygen 8 L              Oxygen Initial Assessment:  Oxygen Initial Assessment - 08/15/22 0923       Home Oxygen   Home Oxygen Device E-Tanks;Home Concentrator    Sleep Oxygen Prescription Continuous    Liters per minute 4    Home Exercise Oxygen Prescription Continuous    Liters per minute 8    Home Resting Oxygen Prescription Continuous     Liters per minute 2    Compliance with Home Oxygen Use Yes      Initial 6 min Walk   Oxygen Used Continuous    Liters per minute 8      Program Oxygen Prescription   Program Oxygen Prescription Continuous    Liters per minute 8      Intervention   Short Term Goals To learn and exhibit compliance with exercise, home and travel O2 prescription;To learn and understand importance of maintaining oxygen saturations>88%;To learn and demonstrate proper use of respiratory medications;To learn and understand importance of monitoring SPO2 with pulse oximeter and demonstrate accurate use of the pulse oximeter.;To learn and demonstrate proper pursed lip breathing techniques or other breathing techniques.     Long  Term Goals Exhibits compliance with exercise, home  and travel O2 prescription;Verbalizes importance of monitoring SPO2 with pulse oximeter and return demonstration;Maintenance of O2 saturations>88%;Exhibits proper breathing techniques, such as pursed lip breathing or other method taught during program session;Compliance with respiratory medication;Demonstrates proper use of MDI's             Oxygen Re-Evaluation:  Oxygen Re-Evaluation     Row Name 08/31/22 1015 09/21/22 1351 10/25/22 1620 11/20/22 1631 12/19/22 1032     Program Oxygen Prescription   Program Oxygen Prescription Continuous Continuous Continuous Continuous Continuous   Liters per minute 8 10 10 10 10    Comments -- 94% on 10L 94% on 10L -- --     Home Oxygen   Home Oxygen Device E-Tanks;Home Concentrator E-Tanks;Home Concentrator E-Tanks;Home Concentrator E-Tanks;Home Concentrator E-Tanks;Home Concentrator   Sleep Oxygen Prescription Continuous Continuous Continuous Continuous Continuous   Liters per minute 4 4 4 4 4    Home Exercise Oxygen Prescription Continuous Continuous Continuous Continuous Continuous   Liters per minute 8 8 8 8 8    Home Resting Oxygen Prescription Continuous Continuous Continuous Continuous  Continuous   Liters per minute 2 2 2 2 2    Compliance with Home Oxygen Use Yes Yes Yes Yes Yes     Goals/Expected Outcomes   Short Term Goals To learn and exhibit compliance with exercise, home and travel O2  prescription;To learn and understand importance of maintaining oxygen saturations>88%;To learn and demonstrate proper use of respiratory medications;To learn and understand importance of monitoring SPO2 with pulse oximeter and demonstrate accurate use of the pulse oximeter.;To learn and demonstrate proper pursed lip breathing techniques or other breathing techniques.  To learn and exhibit compliance with exercise, home and travel O2 prescription;To learn and understand importance of maintaining oxygen saturations>88%;To learn and demonstrate proper use of respiratory medications;To learn and understand importance of monitoring SPO2 with pulse oximeter and demonstrate accurate use of the pulse oximeter.;To learn and demonstrate proper pursed lip breathing techniques or other breathing techniques.  To learn and exhibit compliance with exercise, home and travel O2 prescription;To learn and understand importance of maintaining oxygen saturations>88%;To learn and demonstrate proper use of respiratory medications;To learn and understand importance of monitoring SPO2 with pulse oximeter and demonstrate accurate use of the pulse oximeter.;To learn and demonstrate proper pursed lip breathing techniques or other breathing techniques.  To learn and exhibit compliance with exercise, home and travel O2 prescription;To learn and understand importance of maintaining oxygen saturations>88%;To learn and demonstrate proper use of respiratory medications;To learn and understand importance of monitoring SPO2 with pulse oximeter and demonstrate accurate use of the pulse oximeter.;To learn and demonstrate proper pursed lip breathing techniques or other breathing techniques.  To learn and exhibit compliance with exercise, home  and travel O2 prescription;To learn and understand importance of maintaining oxygen saturations>88%;To learn and demonstrate proper use of respiratory medications;To learn and understand importance of monitoring SPO2 with pulse oximeter and demonstrate accurate use of the pulse oximeter.;To learn and demonstrate proper pursed lip breathing techniques or other breathing techniques.    Long  Term Goals Exhibits compliance with exercise, home  and travel O2 prescription;Verbalizes importance of monitoring SPO2 with pulse oximeter and return demonstration;Maintenance of O2 saturations>88%;Exhibits proper breathing techniques, such as pursed lip breathing or other method taught during program session;Compliance with respiratory medication;Demonstrates proper use of MDI's Exhibits compliance with exercise, home  and travel O2 prescription;Verbalizes importance of monitoring SPO2 with pulse oximeter and return demonstration;Maintenance of O2 saturations>88%;Exhibits proper breathing techniques, such as pursed lip breathing or other method taught during program session;Compliance with respiratory medication;Demonstrates proper use of MDI's Exhibits compliance with exercise, home  and travel O2 prescription;Verbalizes importance of monitoring SPO2 with pulse oximeter and return demonstration;Maintenance of O2 saturations>88%;Exhibits proper breathing techniques, such as pursed lip breathing or other method taught during program session;Compliance with respiratory medication;Demonstrates proper use of MDI's Exhibits compliance with exercise, home  and travel O2 prescription;Verbalizes importance of monitoring SPO2 with pulse oximeter and return demonstration;Maintenance of O2 saturations>88%;Exhibits proper breathing techniques, such as pursed lip breathing or other method taught during program session;Compliance with respiratory medication;Demonstrates proper use of MDI's Exhibits compliance with exercise, home  and travel  O2 prescription;Verbalizes importance of monitoring SPO2 with pulse oximeter and return demonstration;Maintenance of O2 saturations>88%;Exhibits proper breathing techniques, such as pursed lip breathing or other method taught during program session;Compliance with respiratory medication;Demonstrates proper use of MDI's   Goals/Expected Outcomes Compliance and understanding of oxygen saturation monitoring and breathing techniques to decrease shortness of breath. Compliance and understanding of oxygen saturation monitoring and breathing techniques to decrease shortness of breath. Compliance and understanding of oxygen saturation monitoring and breathing techniques to decrease shortness of breath. Compliance and understanding of oxygen saturation monitoring and breathing techniques to decrease shortness of breath. Compliance and understanding of oxygen saturation monitoring and breathing techniques to decrease shortness of breath.    Row  Name 01/16/23 1040             Program Oxygen Prescription   Program Oxygen Prescription Continuous       Liters per minute 6       Comments 95% on 8L, 88-90% on 6L         Home Oxygen   Home Oxygen Device E-Tanks;Home Concentrator       Sleep Oxygen Prescription Continuous       Liters per minute 4       Home Exercise Oxygen Prescription Continuous       Liters per minute 8       Home Resting Oxygen Prescription Continuous       Liters per minute 2       Compliance with Home Oxygen Use Yes         Goals/Expected Outcomes   Short Term Goals To learn and exhibit compliance with exercise, home and travel O2 prescription;To learn and understand importance of maintaining oxygen saturations>88%;To learn and demonstrate proper use of respiratory medications;To learn and understand importance of monitoring SPO2 with pulse oximeter and demonstrate accurate use of the pulse oximeter.;To learn and demonstrate proper pursed lip breathing techniques or other breathing  techniques.        Long  Term Goals Exhibits compliance with exercise, home  and travel O2 prescription;Verbalizes importance of monitoring SPO2 with pulse oximeter and return demonstration;Maintenance of O2 saturations>88%;Exhibits proper breathing techniques, such as pursed lip breathing or other method taught during program session;Compliance with respiratory medication;Demonstrates proper use of MDI's       Goals/Expected Outcomes Compliance and understanding of oxygen saturation monitoring and breathing techniques to decrease shortness of breath.                Oxygen Discharge (Final Oxygen Re-Evaluation):  Oxygen Re-Evaluation - 01/16/23 1040       Program Oxygen Prescription   Program Oxygen Prescription Continuous    Liters per minute 6    Comments 95% on 8L, 88-90% on 6L      Home Oxygen   Home Oxygen Device E-Tanks;Home Concentrator    Sleep Oxygen Prescription Continuous    Liters per minute 4    Home Exercise Oxygen Prescription Continuous    Liters per minute 8    Home Resting Oxygen Prescription Continuous    Liters per minute 2    Compliance with Home Oxygen Use Yes      Goals/Expected Outcomes   Short Term Goals To learn and exhibit compliance with exercise, home and travel O2 prescription;To learn and understand importance of maintaining oxygen saturations>88%;To learn and demonstrate proper use of respiratory medications;To learn and understand importance of monitoring SPO2 with pulse oximeter and demonstrate accurate use of the pulse oximeter.;To learn and demonstrate proper pursed lip breathing techniques or other breathing techniques.     Long  Term Goals Exhibits compliance with exercise, home  and travel O2 prescription;Verbalizes importance of monitoring SPO2 with pulse oximeter and return demonstration;Maintenance of O2 saturations>88%;Exhibits proper breathing techniques, such as pursed lip breathing or other method taught during program session;Compliance  with respiratory medication;Demonstrates proper use of MDI's    Goals/Expected Outcomes Compliance and understanding of oxygen saturation monitoring and breathing techniques to decrease shortness of breath.             Initial Exercise Prescription:  Initial Exercise Prescription - 08/15/22 1000       Date of Initial Exercise RX and Referring Provider   Date 08/15/22  Referring Provider Soskis   sees [facility]   Expected Discharge Date 11/08/22      Oxygen   Oxygen Continuous    Liters 8      Treadmill   MPH 2    Grade 0    Minutes 15    METs 2.53      Recumbant Elliptical   Level 1    RPM 30    Watts 70    Minutes 15    METs 2.3      Prescription Details   Frequency (times per week) 2    Duration Progress to 30 minutes of continuous aerobic without signs/symptoms of physical distress      Intensity   THRR 40-80% of Max Heartrate 69-138    Ratings of Perceived Exertion 11-13    Perceived Dyspnea 0-4      Progression   Progression Continue to progress workloads to maintain intensity without signs/symptoms of physical distress.      Resistance Training   Training Prescription Yes    Weight blue bands    Reps 10-15             Perform Capillary Blood Glucose checks as needed.  Exercise Prescription Changes:   Exercise Prescription Changes     Row Name 08/21/22 1200 09/04/22 1200 09/18/22 1200 10/02/22 1200 10/16/22 1200     Response to Exercise   Blood Pressure (Admit) 114/66 122/70 124/62 118/62 110/64   Blood Pressure (Exercise) 140/66 146/60 150/60 134/62 120/64   Blood Pressure (Exit) 102/64 108/66 118/60 112/68 110/60   Heart Rate (Admit) 75 bpm 75 bpm 73 bpm 71 bpm 62 bpm   Heart Rate (Exercise) 110 bpm 105 bpm 100 bpm 110 bpm 107 bpm   Heart Rate (Exit) 80 bpm 81 bpm 76 bpm 83 bpm 77 bpm   Oxygen Saturation (Admit) 98 % 96 % 97 % 96 % 98 %   Oxygen Saturation (Exercise) 92 % 92 % 94 % 89 % 85 %  85% on 8L, 90% on 10L   Oxygen Saturation  (Exit) 97 % 97 % 97 % 96 % 95 %   Rating of Perceived Exertion (Exercise) 15 13 14 13 13    Perceived Dyspnea (Exercise) 2 2 2 2 2    Symptoms DOE -- DOE -- --   Duration Progress to 30 minutes of  aerobic without signs/symptoms of physical distress Progress to 30 minutes of  aerobic without signs/symptoms of physical distress Continue with 30 min of aerobic exercise without signs/symptoms of physical distress. Continue with 30 min of aerobic exercise without signs/symptoms of physical distress. Continue with 30 min of aerobic exercise without signs/symptoms of physical distress.   Intensity THRR unchanged THRR unchanged THRR unchanged THRR unchanged THRR unchanged     Progression   Progression Continue to progress workloads to maintain intensity without signs/symptoms of physical distress. Continue to progress workloads to maintain intensity without signs/symptoms of physical distress. Continue to progress workloads to maintain intensity without signs/symptoms of physical distress. Continue to progress workloads to maintain intensity without signs/symptoms of physical distress. Continue to progress workloads to maintain intensity without signs/symptoms of physical distress.   Average METs 2.3 2.5 -- -- --     Resistance Training   Training Prescription Yes Yes Yes Yes Yes   Weight blue bands blue bands blue bands blue bands blue bands   Reps 10-15 10-15 10-15 10-15 10-15   Time -- 10 Minutes 10 Minutes 10 Minutes 10 Minutes  Interval Training   Interval Training -- No -- -- --     Oxygen   Oxygen Continuous Continuous Continuous Continuous Continuous   Liters 10 10 10  8-10 8-10     Treadmill   MPH 1.8 2 2 2  2.4   Grade 0 0 1 1 1.5   Minutes 15 15 15 15 15    METs 2.38 2.53 2.81 2.81 3.33     Recumbant Elliptical   Level 2 2 3 3 3    RPM 40 -- 40 -- --   Minutes 15 15 15 15 15    METs 2.1 2.5 2.6 2.3 3.1     Oxygen   Maintain Oxygen Saturation 88% or higher 88% or higher 88% or  higher 88% or higher 88% or higher    Row Name 10/30/22 1100 11/15/22 1221 11/27/22 1200 12/11/22 0900 12/25/22 0900     Response to Exercise   Blood Pressure (Admit) 118/60 120/58 -- 128/68 122/68   Blood Pressure (Exercise) 128/62 -- -- 146/64 134/76   Blood Pressure (Exit) 100/68 98/52 -- 118/68 114/60   Heart Rate (Admit) 69 bpm 70 bpm -- 79 bpm 77 bpm   Heart Rate (Exercise) 84 bpm -- -- 97 bpm 108 bpm   Heart Rate (Exit) 72 bpm 88 bpm -- 74 bpm 79 bpm   Oxygen Saturation (Admit) 99 % 97 %  3L -- 98 %  4L 95 %   Oxygen Saturation (Exercise) 99 % 92 %  10L -- 94 %  10L 90 %   Oxygen Saturation (Exit) 97 % 92 %  3L -- 95 %  3L 91 %   Rating of Perceived Exertion (Exercise) 11 13 -- 13 11   Perceived Dyspnea (Exercise) 1 2 -- 1 1   Duration Continue with 30 min of aerobic exercise without signs/symptoms of physical distress. Continue with 30 min of aerobic exercise without signs/symptoms of physical distress. -- Continue with 30 min of aerobic exercise without signs/symptoms of physical distress. Continue with 30 min of aerobic exercise without signs/symptoms of physical distress.   Intensity THRR unchanged THRR unchanged -- THRR unchanged THRR unchanged     Progression   Progression Continue to progress workloads to maintain intensity without signs/symptoms of physical distress. Continue to progress workloads to maintain intensity without signs/symptoms of physical distress. -- Continue to progress workloads to maintain intensity without signs/symptoms of physical distress. Continue to progress workloads to maintain intensity without signs/symptoms of physical distress.     Resistance Training   Training Prescription Yes Yes -- Yes Yes   Weight blue bands blue bands -- blue bands black bands   Reps 10-15 10-15 -- 10-15 10-15   Time 10 Minutes 10 Minutes -- 10 Minutes 10 Minutes     Interval Training   Interval Training -- -- -- No Yes   Equipment -- -- -- -- Treadmill   Comments --  -- -- -- 1 min @ @ 2 mph + 0 incline     Oxygen   Oxygen Continuous Continuous -- Continuous Continuous   Liters 8-10 --  3L with rest, 10L with exertion -- 3-10L 1-6     Treadmill   MPH 2.5 2.5 -- 2.5 3   Grade 1.5 2 -- 2 1   Minutes 15 15 -- 15 15   METs 3.43 3.6 -- 3.6 3.5     Recumbant Elliptical   Level 4 4 -- 4 5   Minutes 15 15 -- 15 15  METs 2.9 2.9 -- 3 3.1     Oxygen   Maintain Oxygen Saturation 88% or higher 88% or higher -- 88% or higher 88% or higher    Row Name 01/08/23 0900 01/22/23 0900           Response to Exercise   Blood Pressure (Admit) 118/64 112/64      Blood Pressure (Exercise) 150/64 136/66      Blood Pressure (Exit) 112/58 114/62      Heart Rate (Admit) 78 bpm 65 bpm      Heart Rate (Exercise) 100 bpm 101 bpm      Heart Rate (Exit) 95 bpm 74 bpm      Oxygen Saturation (Admit) 98 % 97 %      Oxygen Saturation (Exercise) 93 % 90 %      Oxygen Saturation (Exit) 96 % 95 %      Rating of Perceived Exertion (Exercise) 11 12      Perceived Dyspnea (Exercise) 1 1.5      Duration Continue with 30 min of aerobic exercise without signs/symptoms of physical distress. Continue with 30 min of aerobic exercise without signs/symptoms of physical distress.      Intensity THRR unchanged THRR unchanged        Progression   Progression Continue to progress workloads to maintain intensity without signs/symptoms of physical distress. Continue to progress workloads to maintain intensity without signs/symptoms of physical distress.        Resistance Training   Training Prescription Yes Yes      Weight black bands black bands      Reps 10-15 10-15      Time 10 Minutes 10 Minutes        Interval Training   Interval Training Yes Yes      Equipment Treadmill Treadmill      Comments 1 min @ 2. @ + 1 1 min @ 3.2 mph and 1.5% incline, 1 min @ 2.2 mph and 0% incline        Oxygen   Oxygen Continuous Continuous      Liters 1-6 1-6        Treadmill    MPH 3 3.2      Grade 1 1.5      Minutes 15 15      METs 3.5 3.9        Recumbant Elliptical   Level 5 5      Minutes 15 15      METs 3.5 3.3        Oxygen   Maintain Oxygen Saturation 88% or higher 88% or higher               Exercise Comments:   Exercise Comments     Row Name 08/21/22 1155 10/02/22 1539         Exercise Comments Pt completed first day of group exercise. She exercised on the recumbent elliptical for 15 min, level2, METs 2.1. She then walked on the TM for 15 min, speed 1.8 for 12 min, 1.2 mph for 3 min. Tolerated fair. She tolerated warm up and cool down well, performed squats. Discussed METs. Pt is generally overwhelmed by her diagnosis but is managing. Completed home ExRx. Hannah Klein is currently walking at home. She walks 1 non-rehab day/wk for 10-15 min/day. We discussed Lung Transplant guidelines and building to meeting those guidelines. I mentioned walking 3 non-rehab days/wk for at least 20 min/day. She agreed with my recommendations. I discussed increasing  her walking time first and then frequency. I recommended doing a slow progression to meeting transplant guidelines. Hannah Klein agreed to my progression plan. We also discussed exercise strategies as Hannah Klein is walking outside. She is willing to try going to the local [employer] to avoid the heat and humidity. I discussed weather guidelines with [name]. She voiced understanding. She seems motivated to exercise and increase her functional capacity.               Exercise Goals and Review:   Exercise Goals     Row Name 08/15/22 1610 08/31/22 1011 09/21/22 1346 10/25/22 1617       Exercise Goals   Increase Physical Activity Yes Yes Yes Yes    Intervention Provide advice, education, support and counseling about physical activity/exercise needs.;Develop an individualized exercise prescription for aerobic and resistive training based on initial evaluation findings, risk stratification, comorbidities and  participant's personal goals. Provide advice, education, support and counseling about physical activity/exercise needs.;Develop an individualized exercise prescription for aerobic and resistive training based on initial evaluation findings, risk stratification, comorbidities and participant's personal goals. Provide advice, education, support and counseling about physical activity/exercise needs.;Develop an individualized exercise prescription for aerobic and resistive training based on initial evaluation findings, risk stratification, comorbidities and participant's personal goals. Provide advice, education, support and counseling about physical activity/exercise needs.;Develop an individualized exercise prescription for aerobic and resistive training based on initial evaluation findings, risk stratification, comorbidities and participant's personal goals.    Expected Outcomes Short Term: Attend rehab on a regular basis to increase amount of physical activity.;Long Term: Exercising regularly at least 3-5 days a week.;Long Term: Add in home exercise to make exercise part of routine and to increase amount of physical activity. Short Term: Attend rehab on a regular basis to increase amount of physical activity.;Long Term: Exercising regularly at least 3-5 days a week.;Long Term: Add in home exercise to make exercise part of routine and to increase amount of physical activity. Short Term: Attend rehab on a regular basis to increase amount of physical activity.;Long Term: Exercising regularly at least 3-5 days a week.;Long Term: Add in home exercise to make exercise part of routine and to increase amount of physical activity. Short Term: Attend rehab on a regular basis to increase amount of physical activity.;Long Term: Exercising regularly at least 3-5 days a week.;Long Term: Add in home exercise to make exercise part of routine and to increase amount of physical activity.    Increase Strength and Stamina Yes Yes Yes  Yes    Intervention Provide advice, education, support and counseling about physical activity/exercise needs.;Develop an individualized exercise prescription for aerobic and resistive training based on initial evaluation findings, risk stratification, comorbidities and participant's personal goals. Provide advice, education, support and counseling about physical activity/exercise needs.;Develop an individualized exercise prescription for aerobic and resistive training based on initial evaluation findings, risk stratification, comorbidities and participant's personal goals. Provide advice, education, support and counseling about physical activity/exercise needs.;Develop an individualized exercise prescription for aerobic and resistive training based on initial evaluation findings, risk stratification, comorbidities and participant's personal goals. Provide advice, education, support and counseling about physical activity/exercise needs.;Develop an individualized exercise prescription for aerobic and resistive training based on initial evaluation findings, risk stratification, comorbidities and participant's personal goals.    Expected Outcomes Short Term: Increase workloads from initial exercise prescription for resistance, speed, and METs.;Short Term: Perform resistance training exercises routinely during rehab and add in resistance training at home;Long Term: Improve cardiorespiratory fitness, muscular endurance and strength  as measured by increased METs and functional capacity ( ) Short Term: Increase workloads from initial exercise prescription for resistance, speed, and METs.;Short Term: Perform resistance training exercises routinely during rehab and add in resistance training at home;Long Term: Improve cardiorespiratory fitness, muscular endurance and strength as measured by increased METs and functional capacity ( ) Short Term: Increase workloads from initial exercise prescription for resistance,  speed, and METs.;Short Term: Perform resistance training exercises routinely during rehab and add in resistance training at home;Long Term: Improve cardiorespiratory fitness, muscular endurance and strength as measured by increased METs and functional capacity ( ) Short Term: Increase workloads from initial exercise prescription for resistance, speed, and METs.;Short Term: Perform resistance training exercises routinely during rehab and add in resistance training at home;Long Term: Improve cardiorespiratory fitness, muscular endurance and strength as measured by increased METs and functional capacity ( )    Able to understand and use rate of perceived exertion (RPE) scale Yes Yes Yes Yes    Intervention Provide education and explanation on how to use RPE scale Provide education and explanation on how to use RPE scale Provide education and explanation on how to use RPE scale Provide education and explanation on how to use RPE scale    Expected Outcomes Short Term: Able to use RPE daily in rehab to express subjective intensity level;Long Term:  Able to use RPE to guide intensity level when exercising independently Short Term: Able to use RPE daily in rehab to express subjective intensity level;Long Term:  Able to use RPE to guide intensity level when exercising independently Short Term: Able to use RPE daily in rehab to express subjective intensity level;Long Term:  Able to use RPE to guide intensity level when exercising independently Short Term: Able to use RPE daily in rehab to express subjective intensity level;Long Term:  Able to use RPE to guide intensity level when exercising independently    Able to understand and use Dyspnea scale Yes Yes Yes Yes    Intervention Provide education and explanation on how to use Dyspnea scale Provide education and explanation on how to use Dyspnea scale Provide education and explanation on how to use Dyspnea scale Provide education and explanation on how to use  Dyspnea scale    Expected Outcomes Short Term: Able to use Dyspnea scale daily in rehab to express subjective sense of shortness of breath during exertion;Long Term: Able to use Dyspnea scale to guide intensity level when exercising independently Short Term: Able to use Dyspnea scale daily in rehab to express subjective sense of shortness of breath during exertion;Long Term: Able to use Dyspnea scale to guide intensity level when exercising independently Short Term: Able to use Dyspnea scale daily in rehab to express subjective sense of shortness of breath during exertion;Long Term: Able to use Dyspnea scale to guide intensity level when exercising independently Short Term: Able to use Dyspnea scale daily in rehab to express subjective sense of shortness of breath during exertion;Long Term: Able to use Dyspnea scale to guide intensity level when exercising independently    Knowledge and understanding of Target Heart Rate Range (THRR) Yes Yes Yes Yes    Intervention Provide education and explanation of THRR including how the numbers were predicted and where they are located for reference Provide education and explanation of THRR including how the numbers were predicted and where they are located for reference Provide education and explanation of THRR including how the numbers were predicted and where they are located for reference Provide education and explanation of THRR including  how the numbers were predicted and where they are located for reference    Expected Outcomes Short Term: Able to state/look up THRR;Long Term: Able to use THRR to govern intensity when exercising independently;Short Term: Able to use daily as guideline for intensity in rehab Short Term: Able to state/look up THRR;Long Term: Able to use THRR to govern intensity when exercising independently;Short Term: Able to use daily as guideline for intensity in rehab Short Term: Able to state/look up THRR;Long Term: Able to use THRR to govern  intensity when exercising independently;Short Term: Able to use daily as guideline for intensity in rehab Short Term: Able to state/look up THRR;Long Term: Able to use THRR to govern intensity when exercising independently;Short Term: Able to use daily as guideline for intensity in rehab    Understanding of Exercise Prescription Yes Yes Yes Yes    Intervention Provide education, explanation, and written materials on patient's individual exercise prescription Provide education, explanation, and written materials on patient's individual exercise prescription Provide education, explanation, and written materials on patient's individual exercise prescription Provide education, explanation, and written materials on patient's individual exercise prescription    Expected Outcomes Short Term: Able to explain program exercise prescription;Long Term: Able to explain home exercise prescription to exercise independently Short Term: Able to explain program exercise prescription;Long Term: Able to explain home exercise prescription to exercise independently Short Term: Able to explain program exercise prescription;Long Term: Able to explain home exercise prescription to exercise independently Short Term: Able to explain program exercise prescription;Long Term: Able to explain home exercise prescription to exercise independently             Exercise Goals Re-Evaluation :  Exercise Goals Re-Evaluation     Row Name 08/31/22 1011 09/21/22 1346 10/25/22 1618 11/20/22 1629 12/19/22 1029     Exercise Goal Re-Evaluation   Exercise Goals Review Increase Physical Activity;Able to understand and use Dyspnea scale;Understanding of Exercise Prescription;Increase Strength and Stamina;Knowledge and understanding of Target Heart Rate Range (THRR);Able to understand and use rate of perceived exertion (RPE) scale Increase Physical Activity;Able to understand and use Dyspnea scale;Understanding of Exercise Prescription;Increase  Strength and Stamina;Knowledge and understanding of Target Heart Rate Range (THRR);Able to understand and use rate of perceived exertion (RPE) scale Increase Physical Activity;Able to understand and use Dyspnea scale;Understanding of Exercise Prescription;Increase Strength and Stamina;Knowledge and understanding of Target Heart Rate Range (THRR);Able to understand and use rate of perceived exertion (RPE) scale Increase Physical Activity;Able to understand and use Dyspnea scale;Understanding of Exercise Prescription;Increase Strength and Stamina;Knowledge and understanding of Target Heart Rate Range (THRR);Able to understand and use rate of perceived exertion (RPE) scale Increase Physical Activity;Able to understand and use Dyspnea scale;Understanding of Exercise Prescription;Increase Strength and Stamina;Knowledge and understanding of Target Heart Rate Range (THRR);Able to understand and use rate of perceived exertion (RPE) scale   Comments Hannah Klein has completed 2 exercise sessions. She exercises for 15 min on the recumbent elliptical and treadmill. She averages 2.5 METs at level 2 on the recumbent elliptical and 2.53 METs at 2 mph and 0% incline. She performs the warmup and cooldown standing without limitations. It is too soon to note any discernable progressions. Will continue to monitor and progress as able. Hannah Klein has completed 8 exercise sessions. She exercises for 15 min on the recumbent elliptical and treadmill. She averages 2.5 METs at level 3 on the recumbent elliptical and 2.81 METs at 2 mph and 1% incline. She performs the warmup and cooldown standing without limitations. Hannah Klein has increased her  workload for both exercise modes as METs have increased. She tolerates progressions well. Hannah Klein seems motivated to exercise and improve her functional capacity. She enjoys class as she converses with other patients. Will continue to monitor and progress as able. Hannah Klein has completed 16 exercise sessions.  She exercises for 15 min on the recumbent elliptical and treadmill. She averages 3.0 METs at level 4 on the recumbent elliptical and 3.1 METs at 2.5 mph and 1.5% incline. She performs the warmup and cooldown standing without limitations. Hannah Klein is still progressing her workloads steadily. She is determined to reach her full functional capacity in preparation for the [facility] lung transplant program. Hannah Klein has starting walking around her neighborhood on a regular basis. Will continue to monitor and progress as able. Hannah Klein has completed 21 exercise sessions. She exercises for 15 min on the recumbent elliptical and treadmill. She averages 3.0 METs at level 4 on the recumbent elliptical and 3.3 METs at 2.5 mph and 2% incline. She performs the warmup and cooldown standing without limitations. Hannah Klein is still trying to progress workloads. She increased her workload slightly on the treadmill. I have asked for an update on her home exercise regimen. Hannah Klein is walking out home on a regular basis. Will continue to monitor and progress as able. Hannah Klein has completed 27 exercise sessions. She exercises for 15 min on the recumbent elliptical and treadmill. She averages 3.6 METs at level 4 on the recumbent elliptical and 3.4 METs at 2.5 mph and 2% incline. She performs the warmup and cooldown standing without limitations. Hannah Klein has not increased her workload for either exercise mode although METs have increased on the recumbent elliptical. Hannah Klein is also progressing in her home exercise as her time has increased. Will continue to monitor and progress as able.   Expected Outcomes Through exercise at rehab and home, the patient will decrease shortness of breath with daily activities and feel confident in carrying out an exercise regimen at home. Through exercise at rehab and home, the patient will decrease shortness of breath with daily activities and feel confident in carrying out an exercise regimen at home. Through  exercise at rehab and home, the patient will decrease shortness of breath with daily activities and feel confident in carrying out an exercise regimen at home. Through exercise at rehab and home, the patient will decrease shortness of breath with daily activities and feel confident in carrying out an exercise regimen at home. Through exercise at rehab and home, the patient will decrease shortness of breath with daily activities and feel confident in carrying out an exercise regimen at home.    Row Name 01/16/23 1036             Exercise Goal Re-Evaluation   Exercise Goals Review Increase Physical Activity;Able to understand and use Dyspnea scale;Understanding of Exercise Prescription;Increase Strength and Stamina;Knowledge and understanding of Target Heart Rate Range (THRR);Able to understand and use rate of perceived exertion (RPE) scale       Comments Hannah Klein has completed 35 exercise sessions. She exercises for 15 min on the recumbent elliptical and treadmill. She averages 3.7 METs at level 5 on the recumbent elliptical and 3.7 METs at 3.2 mph and 1.5% incline. She performs the warmup and cooldown standing without limitations. Hannah Klein has increased her workload for both exercise modes as METs have slightly increased. Hannah Klein has also progressed to interval training. She tolerates interval training well. Will continue to monitor and progress as able.       Expected Outcomes Through  exercise at rehab and home, the patient will decrease shortness of breath with daily activities and feel confident in carrying out an exercise regimen at home.                Discharge Exercise Prescription (Final Exercise Prescription Changes):  Exercise Prescription Changes - 01/22/23 0900       Response to Exercise   Blood Pressure (Admit) 112/64    Blood Pressure (Exercise) 136/66    Blood Pressure (Exit) 114/62    Heart Rate (Admit) 65 bpm    Heart Rate (Exercise) 101 bpm    Heart Rate (Exit) 74 bpm     Oxygen Saturation (Admit) 97 %    Oxygen Saturation (Exercise) 90 %    Oxygen Saturation (Exit) 95 %    Rating of Perceived Exertion (Exercise) 12    Perceived Dyspnea (Exercise) 1.5    Duration Continue with 30 min of aerobic exercise without signs/symptoms of physical distress.    Intensity THRR unchanged      Progression   Progression Continue to progress workloads to maintain intensity without signs/symptoms of physical distress.      Resistance Training   Training Prescription Yes    Weight black bands    Reps 10-15    Time 10 Minutes      Interval Training   Interval Training Yes    Equipment Treadmill    Comments 1 min @ 3.2 mph and 1.5% incline, 1 min @ 2.2 mph and 0% incline      Oxygen   Oxygen Continuous    Liters 1-6      Treadmill   MPH 3.2    Grade 1.5    Minutes 15    METs 3.9      Recumbant Elliptical   Level 5    Minutes 15    METs 3.3      Oxygen   Maintain Oxygen Saturation 88% or higher             Nutrition:  Target Goals: Understanding of nutrition guidelines, daily intake of sodium 1500mg , cholesterol 200mg , calories 30% from fat and 7% or less from saturated fats, daily to have 5 or more servings of fruits and vegetables.  Biometrics:    Nutrition Therapy Plan and Nutrition Goals:  Nutrition Therapy & Goals - 01/18/23 1541       Nutrition Therapy   Diet heart healthy diet    Drug/Food Interactions Statins/Certain Fruits      Personal Nutrition Goals   Nutrition Goal Patient to improve diet quality by using the plate method as a guide for meal planning to include lean protein/plant protein, fruits, vegetables, whole grains, nonfat dairy as part of a well-balanced diet.   goal in progress.   Personal Goal #2 Patient to identify strategies for weight loss of 0.5-2.0# per week.   goal in progress.   Personal Goal #3 Patient to identify food sources and limit daily intake of saturated fat, tran fat, sodium, and refined  carbohdyrates.   goal in progress.   Comments Goals in action. We have discussed multiple strategies for weight loss including increasing low calorie dense foods, protein supplements, the plate method as a guide for meal planning, mindful eating strategies, and tracking intake. She was not successful with weight loss with these methods; recommended starting a Very Low Calorie Diet rich in protein and non-starchy vegetables for 30+ days to aid with creating consistent calorie deficit  Since then, she is down 18.5# from  her highest weight with our program (109.8kg).  Hannah Klein is currently working toward lung transplant through [place]. Per documenation with [facility] transplant dietitian, 6/13 and 9/26, she is working toward an initial weight loss goal of 203# and ultimatley a BMI of 27-28. She has maintained her weight over the last ~30 days. She will follow-up with [facility] transplant team 12/26. She reports that multiple weight loss drugs including wegovy have been denied by her insurance company. Patient will benefit from participation in pulmonary rehab for nutrition, exercise, and lifestyle modification.      Intervention Plan   Intervention Prescribe, educate and counsel regarding individualized specific dietary modifications aiming towards targeted core components such as weight, hypertension, lipid management, diabetes, heart failure and other comorbidities.;Nutrition handout(s) given to patient.    Expected Outcomes Short Term Goal: Understand basic principles of dietary content, such as calories, fat, sodium, cholesterol and nutrients.;Long Term Goal: Adherence to prescribed nutrition plan.;Short Term Goal: A plan has been developed with personal nutrition goals set during dietitian appointment.             Nutrition Assessments:  MEDIFICTS Score Key: >=70 Need to make dietary changes  40-70 Heart Healthy Diet <= 40 Therapeutic Level Cholesterol Diet   Picture Your Plate Scores: <40 Unhealthy  dietary pattern with much room for improvement. 41-50 Dietary pattern unlikely to meet recommendations for good health and room for improvement. 51-60 More healthful dietary pattern, with some room for improvement.  >60 Healthy dietary pattern, although there may be some specific behaviors that could be improved.    Nutrition Goals Re-Evaluation:  Nutrition Goals Re-Evaluation     Row Name 08/21/22 1055 09/18/22 1153 10/16/22 1123 11/13/22 1527 12/20/22 0905     Goals   Current Weight 235 lb 7.2 oz (106.8 kg) 242 lb 4.6 oz (109.9 kg) 235 lb 7.2 oz (106.8 kg) 231 lb 14.8 oz (105.2 kg) 223 lb 8.7 oz (101.4 kg)   Comment A1c WNL, cholesterol 247, triglycerides 252 no new labs; most recent labs A1c WNL, cholesterol 247, triglycerides 25 252 no new labs; most recent labs A1c WNL, cholesterol 247, triglycerides 252 no new labs; most recent labs A1c WNL, cholesterol 247, triglycerides 252 no new pertinent labs; most recent labs A1c WNL, cholesterol 247, triglycerides 252   Expected Outcome Hannah Klein is currently working toward lung transplant through [place]. Per documenation, 6/13, she is working toward an initial weight loss goal of 203# and ultimatley a BMI of 27-28. She reports that multiple weight loss drugs including wegovy have been denied by her insurance company. She is taking prednisone. She has not started making many dietary changes; she continues snacking on pork rinds and twizzlers and eats out 1-2x per week including sweet tea at these meals. Patient will benefit from participation in pulmonary rehab for nutrition, exercise, and lifestyle modification. Goals in progress. We have discussed multiple strategies for weight loss including increasing low calorie dense foods, protein supplements, the plate method as a guide for meal planning, mindful eating strategies, and tracking intake. She has not met weight loss goals and continues to struggle with oversnacking/boredom type snacking. She is up 6.8#  since starting with our program. Hannah Klein is currently working toward lung transplant through [place]. Per documenation, 6/13, she is working toward an initial weight loss goal of 203# and ultimatley a BMI of 27-28. She reports that multiple weight loss drugs including wegovy have been denied by her insurance company. She is taking prednisone. Patient will benefit from participation in pulmonary  rehab for nutrition, exercise, and lifestyle modification. Goals in progress. We have discussed multiple strategies for weight loss including increasing low calorie dense foods, protein supplements, the plate method as a guide for meal planning, mindful eating strategies, and tracking intake. She has not met weight loss goals and continues to struggle with oversnacking/boredom type snacking and over eating high fat foods (bacon). She is up 4# since starting with our program though down from her highest weight with our program. Hannah Klein is currently working toward lung transplant through [place]. Per documenation, 6/13, she is working toward an initial weight loss goal of 203# and ultimatley a BMI of 27-28. She reports that multiple weight loss drugs including wegovy have been denied by her insurance company. She is taking prednisone. Recommended starting a Very Low Calorie Diet for 14 days to aid with creating consistent calorie deficit; patient is receptive to this. Patient will benefit from participation in pulmonary rehab for nutrition, exercise, and lifestyle modification. Goals in action. We have discussed multiple strategies for weight loss including increasing low calorie dense foods, protein supplements, the plate method as a guide for meal planning, mindful eating strategies, and tracking intake. She was not successful with weight loss with these methods; recommended starting a Very Low Calorie Diet for 14-30 days to aid with creating consistent calorie deficit She starting this and is down 10# (105.2kg/231.4#) from her  highest weight with our program (109.8kg). Hannah Klein is currently working toward lung transplant through [facility]. Per documenation, 6/13, she is working toward an initial weight loss goal of 203# and ultimatley a BMI of 27-28. She reports that multiple weight loss drugs including wegovy have been denied by her insurance company. Patient will benefit from participation in pulmonary rehab for nutrition, exercise, and lifestyle modification. Goals in action. We have discussed multiple strategies for weight loss including increasing low calorie dense foods, protein supplements, the plate method as a guide for meal planning, mindful eating strategies, and tracking intake. She was not successful with weight loss with these methods; recommended starting a Very Low Calorie Diet rich in protein and non-starchy vegetables for 30+ days to aid with creating consistent calorie deficit She is down 18.5# from her highest weight with our program (109.8kg). Hannah Klein is currently working toward lung transplant through [place]. Per documenation with [facility] transplant dietitian, 6/13 and 9/26, she is working toward an initial weight loss goal of 203# and ultimatley a BMI of 27-28. She reports that multiple weight loss drugs including wegovy have been denied by her insurance company. Patient will benefit from participation in pulmonary rehab for nutrition, exercise, and lifestyle modification.    Row Name 01/18/23 1541             Goals   Current Weight 223 lb 8.7 oz (101.4 kg)       Comment no new pertinent labs; most recent labs A1c WNL, cholesterol 247, triglycerides 252       Expected Outcome Goals in action. We have discussed multiple strategies for weight loss including increasing low calorie dense foods, protein supplements, the plate method as a guide for meal planning, mindful eating strategies, and tracking intake. She was not successful with weight loss with these methods; recommended starting a Very Low Calorie Diet rich in  protein and non-starchy vegetables for 30+ days to aid with creating consistent calorie deficit Since then, she is down 18.5# from her highest weight with our program (109.8kg). Hannah Klein is currently working toward lung transplant through [place]. Per documenation with [facility]  transplant dietitian, 6/13 and 9/26, she is working toward an initial weight loss goal of 203# and ultimatley a BMI of 27-28. She has maintained her weight over the last ~30 days. She will follow-up with [facility] transplant team 12/26. She reports that multiple weight loss drugs including wegovy have been denied by her insurance company. Patient will benefit from participation in pulmonary rehab for nutrition, exercise, and lifestyle modification.                Nutrition Goals Discharge (Final Nutrition Goals Re-Evaluation):  Nutrition Goals Re-Evaluation - 01/18/23 1541       Goals   Current Weight 223 lb 8.7 oz (101.4 kg)    Comment no new pertinent labs; most recent labs A1c WNL, cholesterol 247, triglycerides 252    Expected Outcome Goals in action. We have discussed multiple strategies for weight loss including increasing low calorie dense foods, protein supplements, the plate method as a guide for meal planning, mindful eating strategies, and tracking intake. She was not successful with weight loss with these methods; recommended starting a Very Low Calorie Diet rich in protein and non-starchy vegetables for 30+ days to aid with creating consistent calorie deficit Since then, she is down 18.5# from her highest weight with our program (109.8kg). Hannah Klein is currently working toward lung transplant through [place]. Per documenation with [facility] transplant dietitian, 6/13 and 9/26, she is working toward an initial weight loss goal of 203# and ultimatley a BMI of 27-28. She has maintained her weight over the last ~30 days. She will follow-up with [facility] transplant team 12/26. She reports that multiple weight loss drugs including wegovy have been  denied by her insurance company. Patient will benefit from participation in pulmonary rehab for nutrition, exercise, and lifestyle modification.             Psychosocial: Target Goals: Acknowledge presence or absence of significant depression and/or stress, maximize coping skills, provide positive support system. Participant is able to verbalize types and ability to use techniques and skills needed for reducing stress and depression.  Initial Review & Psychosocial Screening:  Initial Psych Review & Screening - 08/15/22 6433       Initial Review   Current issues with Current Anxiety/Panic    Comments Pt is on medication for anixety. Feels that anxiety is controlled.      Family Dynamics   Good Support System? Yes    Comments mother, 3 brothers, step mother, and friends      Barriers   Psychosocial barriers to participate in program There are no identifiable barriers or psychosocial needs.      Screening Interventions   Interventions Encouraged to exercise             Quality of Life Scores:  Scores of 19 and below usually indicate a poorer quality of life in these areas.  A difference of  2-3 points is a clinically meaningful difference.  A difference of 2-3 points in the total score of the Quality of Life Index has been associated with significant improvement in overall quality of life, self-image, physical symptoms, and general health in studies assessing change in quality of life.  PHQ-9: Review Flowsheet       08/15/2022 08/14/2022 06/09/2021  Depression screen PHQ 2/9  Decreased Interest 1 1 1   Down, Depressed, Hopeless 0 0 0  PHQ - 2 Score 1 1 1   Altered sleeping 0 - 1  Tired, decreased energy 0 - 1  Change in appetite 1 -  0  Feeling bad or failure about yourself  0 - 0  Trouble concentrating 0 - 0  Moving slowly or fidgety/restless 0 - 0  Suicidal thoughts 0 - 0  PHQ-9 Score 2 - 3  Difficult doing work/chores Not difficult at all - Not difficult at all     Details           Interpretation of Total Score  Total Score Depression Severity:  1-4 = Minimal depression, 5-9 = Mild depression, 10-14 = Moderate depression, 15-19 = Moderately severe depression, 20-27 = Severe depression   Psychosocial Evaluation and Intervention:  Psychosocial Evaluation - 08/15/22 0919       Psychosocial Evaluation & Interventions   Interventions Encouraged to exercise with the program and follow exercise prescription    Comments Hannah Klein denies any psychosocial barriers to participating in rehab. Will continue to monitor.    Expected Outcomes For [name] to participate in rehab free of psychosocial barriers.    Continue Psychosocial Services  No Follow up required             Psychosocial Re-Evaluation:  Psychosocial Re-Evaluation     Row Name 09/03/22 0919 09/28/22 0910 10/22/22 1509 11/19/22 0914 12/17/22 1122     Psychosocial Re-Evaluation   Current issues with None Identified None Identified None Identified None Identified None Identified   Comments Hannah Klein denies any new psychosocial barriers or concerns at this time. Hannah Klein continues to deny any psychosocial barriers or concerns at this time. Hannah Klein again denies any needs from the psychosocial eval. She states she has a therapist she sees and is compliant with taking her psychotropic meds. Hannah Klein is still being worked up for a possible lung transplant. She is optimistic about the evaluation. Hannah Klein again denies any needs from the psychosocial eval. She states she has a therapist she sees and is compliant with taking her psychotropic meds. Hannah Klein is still being worked up for a possible lung transplant. She is currently going to [facility] for procedures. She is optimistic about the evaluation. Hannah Klein again denies any new psychosocial barriers or concerns. She states she has a therapist and is compliant with taking her psychotropic meds. Hannah Klein is still being worked up for a possible lung transplant. She  is optimistic about the evaluation.   Expected Outcomes For Hannah Klein to continue participating in PR free of any psychosocial barriers or concerns For Hannah Klein to continue participating in PR free of any psychosocial barriers or concerns For Hannah Klein to continue participating in PR free of any psychosocial barriers or concerns. For Hannah Klein to continue participating in PR free of any psychosocial barriers or concerns. For Hannah Klein to continue participating in PR free of any psychosocial barriers or concerns.   Interventions Encouraged to attend Pulmonary Rehabilitation for the exercise Encouraged to attend Pulmonary Rehabilitation for the exercise Encouraged to attend Pulmonary Rehabilitation for the exercise Encouraged to attend Pulmonary Rehabilitation for the exercise Encouraged to attend Pulmonary Rehabilitation for the exercise   Continue Psychosocial Services  No Follow up required No Follow up required No Follow up required No Follow up required No Follow up required    Row Name 01/14/23 0951             Psychosocial Re-Evaluation   Current issues with None Identified       Comments Hannah Klein again denies any new psychosocial barriers or concerns. She states she has finally been approved for disability. This has taken alot of stress off of her. She states she is in a good  place as far as her mental health is concerned. She is working with her therapist and is compliant with taking her psychotropic meds. Hannah Klein is still being worked up for a possible lung transplant. She is optimistic about the evaluation. She is also happy about having to use less oxygen when exercising.       Expected Outcomes For Hannah Klein to continue participating in PR free of any psychosocial barriers or concerns.       Interventions Encouraged to attend Pulmonary Rehabilitation for the exercise       Continue Psychosocial Services  No Follow up required                Psychosocial Discharge (Final Psychosocial  Re-Evaluation):  Psychosocial Re-Evaluation - 01/14/23 0951       Psychosocial Re-Evaluation   Current issues with None Identified    Comments Hannah Klein again denies any new psychosocial barriers or concerns. She states she has finally been approved for disability. This has taken alot of stress off of her. She states she is in a good place as far as her mental health is concerned. She is working with her therapist and is compliant with taking her psychotropic meds. Hannah Klein is still being worked up for a possible lung transplant. She is optimistic about the evaluation. She is also happy about having to use less oxygen when exercising.    Expected Outcomes For Hannah Klein to continue participating in PR free of any psychosocial barriers or concerns.    Interventions Encouraged to attend Pulmonary Rehabilitation for the exercise    Continue Psychosocial Services  No Follow up required             Education: Education Goals: Education classes will be provided on a weekly basis, covering required topics. Participant will state understanding/return demonstration of topics presented.  Learning Barriers/Preferences:  Learning Barriers/Preferences - 08/15/22 0920       Learning Barriers/Preferences   Learning Barriers Sight   wears glasses   Learning Preferences Group Instruction;Individual Instruction;Skilled Demonstration             Education Topics: Know Your Numbers Group instruction that is supported by a PowerPoint presentation. Instructor discusses importance of knowing and understanding resting, exercise, and post-exercise oxygen saturation, heart rate, and blood pressure. Oxygen saturation, heart rate, blood pressure, rating of perceived exertion, and dyspnea are reviewed along with a normal range for these values.  Flowsheet Row PULMONARY REHAB OTHER RESPIRATORY from 11/29/2022 in [facility] for Heart, Vascular, & Lung Health  Date 11/29/22  Educator EP   Instruction Review Code 1- Verbalizes Understanding       Exercise for the Pulmonary Patient Group instruction that is supported by a PowerPoint presentation. Instructor discusses benefits of exercise, core components of exercise, frequency, duration, and intensity of an exercise routine, importance of utilizing pulse oximetry during exercise, safety while exercising, and options of places to exercise outside of rehab.  Flowsheet Row PULMONARY REHAB OTHER RESPIRATORY from 08/23/2022 in [facility] for Heart, Vascular, & Lung Health  Date 08/23/22  Educator EP  Instruction Review Code 1- Verbalizes Understanding       MET Level  Group instruction provided by PowerPoint, verbal discussion, and written material to support subject matter. Instructor reviews what METs are and how to increase METs.  Flowsheet Row PULMONARY REHAB OTHER RESPIRATORY from 01/17/2023 in [facility] for Heart, Vascular, & Lung Health  Date 01/17/23  Educator EP  Instruction Review  Code 1- Verbalizes Understanding       Pulmonary Medications Verbally interactive group education provided by instructor with focus on inhaled medications and proper administration. Flowsheet Row PULMONARY REHAB OTHER RESPIRATORY from 11/15/2022 in [facility] for Heart, Vascular, & Lung Health  Date 11/15/22  Educator RT  Instruction Review Code 1- Verbalizes Understanding       Anatomy and Physiology of the Respiratory System Group instruction provided by PowerPoint, verbal discussion, and written material to support subject matter. Instructor reviews respiratory cycle and anatomical components of the respiratory system and their functions. Instructor also reviews differences in obstructive and restrictive respiratory diseases with examples of each.  Flowsheet Row PULMONARY REHAB OTHER RESPIRATORY from 11/08/2022 in [facility] for  Heart, Vascular, & Lung Health  Date 11/08/22  Educator RT  Instruction Review Code 1- Verbalizes Understanding       Oxygen Safety Group instruction provided by PowerPoint, verbal discussion, and written material to support subject matter. There is an overview of "What is Oxygen" and "Why do we need it".  Instructor also reviews how to create a safe environment for oxygen use, the importance of using oxygen as prescribed, and the risks of noncompliance. There is a brief discussion on traveling with oxygen and resources the patient may utilize. Flowsheet Row PULMONARY REHAB OTHER RESPIRATORY from 12/06/2022 in [facility] for Heart, Vascular, & Lung Health  Date 12/06/22  Educator RN  Instruction Review Code 1- Verbalizes Understanding       Oxygen Use Group instruction provided by PowerPoint, verbal discussion, and written material to discuss how supplemental oxygen is prescribed and different types of oxygen supply systems. Resources for more information are provided.  Flowsheet Row PULMONARY REHAB OTHER RESPIRATORY from 12/13/2022 in [facility] for Heart, Vascular, & Lung Health  Date 12/13/22  Educator RT  Instruction Review Code 1- Verbalizes Understanding       Breathing Techniques Group instruction that is supported by demonstration and informational handouts. Instructor discusses the benefits of pursed lip and diaphragmatic breathing and detailed demonstration on how to perform both.  Flowsheet Row PULMONARY REHAB OTHER RESPIRATORY from 12/20/2022 in [facility] for Heart, Vascular, & Lung Health  Date 12/20/22  Educator RN  Instruction Review Code 1- Verbalizes Understanding        Risk Factor Reduction Group instruction that is supported by a PowerPoint presentation. Instructor discusses the definition of a risk factor, different risk factors for pulmonary disease, and how the heart and lungs  work together. Flowsheet Row PULMONARY REHAB OTHER RESPIRATORY from 01/10/2023 in [facility] for Heart, Vascular, & Lung Health  Date 01/10/23  Educator EP  Instruction Review Code 1- Verbalizes Understanding       Pulmonary Diseases Group instruction provided by PowerPoint, verbal discussion, and written material to support subject matter. Instructor gives an overview of the different type of pulmonary diseases. There is also a discussion on risk factors and symptoms as well as ways to manage the diseases.   Stress and Energy Conservation Group instruction provided by PowerPoint, verbal discussion, and written material to support subject matter. Instructor gives an overview of stress and the impact it can have on the body. Instructor also reviews ways to reduce stress. There is also a discussion on energy conservation and ways to conserve energy throughout the day. Flowsheet Row PULMONARY REHAB OTHER RESPIRATORY from 12/27/2022 in [facility] for  Heart, Vascular, & Lung Health  Date 12/27/22  Educator RN  Instruction Review Code 1- Verbalizes Understanding       Warning Signs and Symptoms Group instruction provided by PowerPoint, verbal discussion, and written material to support subject matter. Instructor reviews warning signs and symptoms of stroke, heart attack, cold and flu. Instructor also reviews ways to prevent the spread of infection. Flowsheet Row PULMONARY REHAB OTHER RESPIRATORY from 01/03/2023 in [facility] for Heart, Vascular, & Lung Health  Date 01/03/23  Educator RN  Instruction Review Code 1- Verbalizes Understanding       Other Education Group or individual verbal, written, or video instructions that support the educational goals of the pulmonary rehab program. Flowsheet Row PULMONARY REHAB OTHER RESPIRATORY from 11/01/2022 in [facility] for Heart, Vascular, & Lung  Health  Date 11/01/22  Educator RT  Instruction Review Code 1- Verbalizes Understanding        Knowledge Questionnaire Score:  Knowledge Questionnaire Score - 08/15/22 0929       Knowledge Questionnaire Score   Pre Score 18/18             Core Components/Risk Factors/Patient Goals at Admission:  Personal Goals and Risk Factors at Admission - 08/15/22 0920       Core Components/Risk Factors/Patient Goals on Admission    Weight Management Yes;Weight Loss    Intervention Weight Management: Develop a combined nutrition and exercise program designed to reach desired caloric intake, while maintaining appropriate intake of nutrient and fiber, sodium and fats, and appropriate energy expenditure required for the weight goal.;Weight Management: Provide education and appropriate resources to help participant work on and attain dietary goals.;Weight Management/Obesity: Establish reasonable short term and long term weight goals.;Obesity: Provide education and appropriate resources to help participant work on and attain dietary goals.    Expected Outcomes Short Term: Continue to assess and modify interventions until short term weight is achieved;Long Term: Adherence to nutrition and physical activity/exercise program aimed toward attainment of established weight goal;Weight Maintenance: Understanding of the daily nutrition guidelines, which includes 25-35% calories from fat, 7% or less cal from saturated fats, less than 200mg  cholesterol, less than 1.5gm of sodium, & 5 or more servings of fruits and vegetables daily;Weight Loss: Understanding of general recommendations for a balanced deficit meal plan, which promotes 1-2 lb weight loss per week and includes a negative energy balance of [phone] kcal/d;Understanding recommendations for meals to include 15-35% energy as protein, 25-35% energy from fat, 35-60% energy from carbohydrates, less than 200mg  of dietary cholesterol, 20-35 gm of total fiber  daily;Understanding of distribution of calorie intake throughout the day with the consumption of 4-5 meals/snacks    Improve shortness of breath with ADL's Yes    Intervention Provide education, individualized exercise plan and daily activity instruction to help decrease symptoms of SOB with activities of daily living.    Expected Outcomes Short Term: Improve cardiorespiratory fitness to achieve a reduction of symptoms when performing ADLs;Long Term: Be able to perform more ADLs without symptoms or delay the onset of symptoms             Core Components/Risk Factors/Patient Goals Review:   Goals and Risk Factor Review     Row Name 09/03/22 0924 09/28/22 0913 10/22/22 1512 11/19/22 0915 12/17/22 1124     Core Components/Risk Factors/Patient Goals Review   Personal Goals Review Weight Management/Obesity;Improve shortness of breath with ADL's Weight Management/Obesity;Improve shortness of breath with ADL's Weight Management/Obesity;Improve shortness of  breath with ADL's Weight Management/Obesity;Improve shortness of breath with ADL's Weight Management/Obesity;Improve shortness of breath with ADL's   Review Hannah Klein has recently started the program. She is working with staff dietician to achieve her weight loss goals. She is able to demonstrate purse lip breathing when she gets short of breath. She still gets short of breath when performing her ADL's. Hannah Klein is enjoying the program so far. We will continue to monitor her progress. Goal progressing for weight loss. Hannah Klein has been working with our dietician for weight loss goals. Goal progressing on improving her shortness of breath with ADLs. Hannah Klein is being worked up for lung transplant and remains optimistic through this process. We will continue to monitor her progress throughout the program. Goal in progress to lose weight and make healthy choices. Hannah Klein is working with our dietician on healthy eating habits and less snacking. Per [facility] lung  transplant team recommendations, Hannah Klein will need to lose weight before surgery. Our dietician has recently suggested a low calorie 14-day plan. Hannah Klein was excited to see that she lost weight last week. Hopefully this will jump start her weight loss journey and give her the confidence that she can lose weight. Goal in progress on improving her shortness of breath with ADLs. Hannah Klein is maintaining her oxygen saturation of 88% or higher on 8-10L with exertion. She is able to titrate down to 2L at rest. Hannah Klein will continue to benefit from PR for nutrition, education, exercise, and lifestyle modification. Goal in progress to lose weight. Hannah Klein is working with our dietician on healthy eating habits and less snacking. Per [facility] lung transplant team recommendations, Hannah Klein will need to lose weight before surgery. She is currently down ~8#. Goal in progress on improving her shortness of breath with ADLs. Hannah Klein is maintaining her oxygen saturation of 88% or higher on 8-10L with exertion. She has increased both his workload and METs while maintain on her current oxygen prescription. Hannah Klein will continue to benefit from PR for nutrition, education, exercise, and lifestyle modification. Goal in progress to lose weight. Hannah Klein is working with our dietician to achieve her weight loss goals. Per [facility] lung transplant team recommendations, Hannah Klein will need to lose weight before surgery. She is currently down 10#. Goal in progress on improving her shortness of breath with ADLs. Hannah Klein is maintaining her oxygen saturation of 88% or higher on 8-10L with exertion. She has increased both his workload and METs while maintain on her current oxygen prescription. Hannah Klein will continue to benefit from PR for nutrition, education, exercise, and lifestyle modification.   Expected Outcomes See admission goals. See admission goals. For [name] to lose weight and improve her shortness of breath with ADLs For [name] to lose weight and  improve her shortness of breath with ADLs For [name] to lose weight and improve her shortness of breath with ADLs    Row Name 01/14/23 4098             Core Components/Risk Factors/Patient Goals Review   Personal Goals Review Weight Management/Obesity;Improve shortness of breath with ADL's       Review Goal in progress to lose weight. Hannah Klein is working with our dietician to achieve her weight loss goals. She is currently down 18# from her highest weigh since starting the program. Goal in progress on improving her shortness of breath with ADLs. Hannah Klein is maintaining her oxygen saturation of 88% or higher on 6-8L with exertion. She has increased both his workload and METs while maintaining on her current oxygen prescription.  Hannah Klein will continue to benefit from PR for nutrition, education, exercise, and lifestyle modification.       Expected Outcomes For [name] to lose weight and improve her shortness of breath with ADLs                Core Components/Risk Factors/Patient Goals at Discharge (Final Review):   Goals and Risk Factor Review - 01/14/23 0955       Core Components/Risk Factors/Patient Goals Review   Personal Goals Review Weight Management/Obesity;Improve shortness of breath with ADL's    Review Goal in progress to lose weight. Hannah Klein is working with our dietician to achieve her weight loss goals. She is currently down 18# from her highest weigh since starting the program. Goal in progress on improving her shortness of breath with ADLs. Hannah Klein is maintaining her oxygen saturation of 88% or higher on 6-8L with exertion. She has increased both his workload and METs while maintaining on her current oxygen prescription. Hannah Klein will continue to benefit from PR for nutrition, education, exercise, and lifestyle modification.    Expected Outcomes For [name] to lose weight and improve her shortness of breath with ADLs             ITP Comments:Pt is making expected progress  toward Pulmonary Rehab goals after completing 37 session(s). Recommend continued exercise, life style modification, education, and utilization of breathing techniques to increase stamina and strength, while also decreasing shortness of breath with exertion.  Dr. Mechele Collin is Medical Director for Pulmonary Rehab at [facility].

## 2023-01-28 ENCOUNTER — Other Ambulatory Visit (INDEPENDENT_AMBULATORY_CARE_PROVIDER_SITE_OTHER): Payer: Commercial Managed Care - HMO

## 2023-01-28 DIAGNOSIS — Z79899 Other long term (current) drug therapy: Secondary | ICD-10-CM | POA: Diagnosis not present

## 2023-01-28 LAB — CBC WITH DIFFERENTIAL/PLATELET
Basophils Absolute: 0.1 10*3/uL (ref 0.0–0.1)
Basophils Relative: 1.1 % (ref 0.0–3.0)
Eosinophils Absolute: 0.2 10*3/uL (ref 0.0–0.7)
Eosinophils Relative: 3.1 % (ref 0.0–5.0)
HCT: 40.5 % (ref 36.0–46.0)
Hemoglobin: 13.3 g/dL (ref 12.0–15.0)
Lymphocytes Relative: 18.7 % (ref 12.0–46.0)
Lymphs Abs: 1.1 10*3/uL (ref 0.7–4.0)
MCHC: 32.7 g/dL (ref 30.0–36.0)
MCV: 93.8 fL (ref 78.0–100.0)
Monocytes Absolute: 0.6 10*3/uL (ref 0.1–1.0)
Monocytes Relative: 10 % (ref 3.0–12.0)
Neutro Abs: 4.1 10*3/uL (ref 1.4–7.7)
Neutrophils Relative %: 67.1 % (ref 43.0–77.0)
Platelets: 189 10*3/uL (ref 150.0–400.0)
RBC: 4.32 Mil/uL (ref 3.87–5.11)
RDW: 13.8 % (ref 11.5–15.5)
WBC: 6.1 10*3/uL (ref 4.0–10.5)

## 2023-01-28 LAB — COMPREHENSIVE METABOLIC PANEL
ALT: 24 U/L (ref 0–35)
AST: 22 U/L (ref 0–37)
Albumin: 4.6 g/dL (ref 3.5–5.2)
Alkaline Phosphatase: 46 U/L (ref 39–117)
BUN: 23 mg/dL (ref 6–23)
CO2: 30 meq/L (ref 19–32)
Calcium: 9.6 mg/dL (ref 8.4–10.5)
Chloride: 101 meq/L (ref 96–112)
Creatinine, Ser: 0.92 mg/dL (ref 0.40–1.20)
GFR: 73.61 mL/min (ref >60.00)
Glucose, Bld: 98 mg/dL (ref 70–99)
Potassium: 3.8 meq/L (ref 3.5–5.1)
Sodium: 139 meq/L (ref 135–145)
Total Bilirubin: 0.5 mg/dL (ref 0.2–1.2)
Total Protein: 6.9 g/dL (ref 6.0–8.3)

## 2023-01-29 ENCOUNTER — Encounter (HOSPITAL_COMMUNITY)
Admission: RE | Admit: 2023-01-29 | Discharge: 2023-01-29 | Disposition: A | Discharge status: Home / Self Care | Payer: Commercial Managed Care - HMO | Source: Ambulatory Visit | Attending: Pulmonary Disease | Admitting: Pulmonary Disease

## 2023-01-29 DIAGNOSIS — J849 Interstitial pulmonary disease, unspecified: Secondary | ICD-10-CM | POA: Diagnosis present

## 2023-01-29 NOTE — Progress Notes (Signed)
Daily Session Note  Patient Details  Name: Hannah Klein MRN: [id number] Date of Birth: 1976/01/08 Referring Provider:   Doristine Devoid Pulmonary Rehab Walk Test from 08/15/2022 in [facility]  Referring Provider Soskis  [sees [name]]       Encounter Date: 01/29/2023  Check In:  Session Check In - 01/29/23 0816       Check-In   Supervising physician immediately available to respond to emergencies [facility] MD immediately available    Physician(s) Eligha Bridegroom, NP    Location [facility]    Staff Present Raford Pitcher, MS, ACSM-CEP, Exercise Physiologist;Randi Dionisio Paschal, ACSM-CEP, Exercise Physiologist    Virtual Visit No    Medication changes reported     No    Fall or balance concerns reported    No    Tobacco Cessation No Change    Warm-up and Cool-down Performed as group-led instruction    Resistance Training Performed Yes    VAD Patient? No    PAD/SET Patient? No      Pain Assessment   Currently in Pain? No/denies             Capillary Blood Glucose: No results found for this or any previous visit (from the past 24 hour(s)).    Social History   Tobacco Use  Smoking Status Former   Current packs/day: 0.00   Average packs/day: 2.0 packs/day for 18.0 years (36.1 ttl pk-yrs)   Types: Cigarettes   Start date: 20   Quit date: 03/16/2007   Years since quitting: 15.8   Passive exposure: Current  Smokeless Tobacco Never    Goals Met:  Proper associated with RPD/PD & O2 Sat Independence with exercise equipment Exercise tolerated well No report of concerns or symptoms today Strength training completed today  Goals Unmet:  Not Applicable  Comments: Service time is from 0805 to 0915.    Dr. Mechele Collin is Medical Director for Pulmonary Rehab at [facility].

## 2023-01-31 ENCOUNTER — Encounter (HOSPITAL_COMMUNITY)
Admission: RE | Admit: 2023-01-31 | Discharge: 2023-01-31 | Disposition: A | Discharge status: Home / Self Care | Payer: Commercial Managed Care - HMO | Source: Ambulatory Visit | Attending: Pulmonary Disease | Admitting: Pulmonary Disease

## 2023-01-31 DIAGNOSIS — J849 Interstitial pulmonary disease, unspecified: Secondary | ICD-10-CM | POA: Diagnosis not present

## 2023-01-31 NOTE — Progress Notes (Signed)
Daily Session Note  Patient Details  Name: Hannah Klein MRN: [id number] Date of Birth: 12-Aug-1975 Referring Provider:   Doristine Devoid Pulmonary Rehab Walk Test from 08/15/2022 in [facility]  Referring Provider Soskis  [sees [name]]       Encounter Date: 01/31/2023  Check In:  Session Check In - 01/31/23 0815       Check-In   Supervising physician immediately available to respond to emergencies [facility] MD immediately available    Physician(s) Edd Fabian, NP    Location [facility]    Staff Present Raford Pitcher, MS, ACSM-CEP, Exercise Physiologist;Randi Dionisio Paschal, ACSM-CEP, Exercise Physiologist;Mary Gerre Scull, RN, Fuller Plan, RT    Virtual Visit No    Medication changes reported     No    Fall or balance concerns reported    No    Tobacco Cessation No Change    Warm-up and Cool-down Performed as group-led instruction    Resistance Training Performed Yes    VAD Patient? No    PAD/SET Patient? No      Pain Assessment   Currently in Pain? No/denies    Multiple Pain Sites No             Capillary Blood Glucose: No results found for this or any previous visit (from the past 24 hour(s)).    Social History   Tobacco Use  Smoking Status Former   Current packs/day: 0.00   Average packs/day: 2.0 packs/day for 18.0 years (36.1 ttl pk-yrs)   Types: Cigarettes   Start date: 82   Quit date: 03/16/2007   Years since quitting: 15.8   Passive exposure: Current  Smokeless Tobacco Never    Goals Met:  Proper associated with RPD/PD & O2 Sat Independence with exercise equipment Exercise tolerated well No report of concerns or symptoms today Strength training completed today  Goals Unmet:  Not Applicable  Comments: Service time is from 0808 to 0921.    Dr. Mechele Collin is Medical Director for Pulmonary Rehab at [facility].

## 2023-02-05 ENCOUNTER — Encounter (HOSPITAL_COMMUNITY)
Admission: RE | Admit: 2023-02-05 | Discharge: 2023-02-05 | Disposition: A | Discharge status: Home / Self Care | Payer: Commercial Managed Care - HMO | Source: Ambulatory Visit | Attending: Pulmonary Disease | Admitting: Pulmonary Disease

## 2023-02-05 VITALS — Wt 228.8 lb

## 2023-02-05 DIAGNOSIS — J849 Interstitial pulmonary disease, unspecified: Secondary | ICD-10-CM | POA: Diagnosis not present

## 2023-02-05 NOTE — Progress Notes (Signed)
Daily Session Note  Patient Details  Name: Hannah Klein MRN: [id number] Date of Birth: 1975-08-29 Referring Provider:   Doristine Devoid Pulmonary Rehab Walk Test from 08/15/2022 in [facility]  Referring Provider Soskis  [sees [name]]       Encounter Date: 02/05/2023  Check In:  Session Check In - 02/05/23 0827       Check-In   Supervising physician immediately available to respond to emergencies [facility] MD immediately available    Physician(s) Johnnette Barrios, NP    Location [facility]    Staff Present Raford Pitcher, MS, ACSM-CEP, Exercise Physiologist;Randi Dionisio Paschal, ACSM-CEP, Exercise Physiologist;Casey Katrinka Blazing, RT    Virtual Visit No    Medication changes reported     No    Fall or balance concerns reported    No    Tobacco Cessation No Change    Warm-up and Cool-down Performed as group-led instruction    Resistance Training Performed Yes    VAD Patient? No    PAD/SET Patient? No      Pain Assessment   Currently in Pain? No/denies    Multiple Pain Sites No             Capillary Blood Glucose: No results found for this or any previous visit (from the past 24 hour(s)).   Exercise Prescription Changes - 02/05/23 0900       Response to Exercise   Blood Pressure (Admit) 110/68    Blood Pressure (Exercise) 156/70    Blood Pressure (Exit) 118/68    Heart Rate (Admit) 72 bpm    Heart Rate (Exercise) 101 bpm    Heart Rate (Exit) 77 bpm    Oxygen Saturation (Admit) 97 %    Oxygen Saturation (Exercise) 90 %    Oxygen Saturation (Exit) 96 %    Rating of Perceived Exertion (Exercise) 11    Perceived Dyspnea (Exercise) 1    Duration Continue with 30 min of aerobic exercise without signs/symptoms of physical distress.    Intensity THRR unchanged      Progression   Progression Continue to progress workloads to maintain intensity without signs/symptoms of physical distress.       Resistance Training   Training Prescription Yes    Weight black bands    Reps 10-15    Time 10 Minutes      Interval Training   Interval Training Yes    Equipment Treadmill    Comments 1 min @ 3.2 mph and 1.5% incline, 1 min @ 2.2 mph and 0% incline.      Oxygen   Oxygen Continuous    Liters 1-6      Treadmill   MPH 3.2    Grade 1.5    Minutes 15    METs 3.9      Recumbant Elliptical   Level 2    Minutes 15    METs 3.2      Oxygen   Maintain Oxygen Saturation 88% or higher             Social History   Tobacco Use  Smoking Status Former   Current packs/day: 0.00   Average packs/day: 2.0 packs/day for 18.0 years (36.1 ttl pk-yrs)   Types: Cigarettes   Start date: 8   Quit date: 03/16/2007   Years since quitting: 15.9   Passive exposure: Current  Smokeless Tobacco Never    Goals Met:  Proper associated with RPD/PD &  O2 Sat Independence with exercise equipment Exercise tolerated well No report of concerns or symptoms today Strength training completed today  Goals Unmet:  Not Applicable  Comments: Service time is from 0818 to 0926.    Dr. Mechele Collin is Medical Director for Pulmonary Rehab at [facility].

## 2023-02-06 ENCOUNTER — Ambulatory Visit: Payer: Commercial Managed Care - HMO | Admitting: Pulmonary Disease

## 2023-02-06 ENCOUNTER — Encounter: Payer: Self-pay | Admitting: Pulmonary Disease

## 2023-02-06 VITALS — BP 130/62 | HR 72 | Temp 98.8°F | Ht 69.0 in | Wt 230.0 lb

## 2023-02-06 DIAGNOSIS — G4733 Obstructive sleep apnea (adult) (pediatric): Secondary | ICD-10-CM

## 2023-02-06 DIAGNOSIS — J849 Interstitial pulmonary disease, unspecified: Secondary | ICD-10-CM

## 2023-02-06 DIAGNOSIS — J4550 Severe persistent asthma, uncomplicated: Secondary | ICD-10-CM | POA: Diagnosis not present

## 2023-02-06 DIAGNOSIS — Z5181 Encounter for therapeutic drug level monitoring: Secondary | ICD-10-CM

## 2023-02-06 MED ORDER — PREDNISONE 10 MG PO TABS: ORAL_TABLET | ORAL | 3 refills | Status: DC

## 2023-02-06 NOTE — Patient Instructions (Signed)
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 We discussed your current medications, recent weight changes, and ongoing symptoms.  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 You are currently stable on Imuran 150mg  daily, and we plan to increase the dose to 200mg  daily if your blood work remains stable. Your prednisone dose has been increased back to 10mg  daily due to symptoms. We will check your blood work on February 21, 2023, and order a CT scan in January or February 2025.  -LUNG TRANSPLANT EVALUATION: You qualify for a lung transplant but need to lose more weight before being placed on the list. You have lost 20 pounds but have recently gained some weight back. Please continue with your transplant evaluation appointments and focus on weight loss.  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 You are tolerating Bactrim well, so please continue taking it as prescribed.  INSTRUCTIONS:  Please follow up in 6 months. We will check your blood work on February 21, 2023, and plan for a CT scan in January or February 2025. Continue with your transplant evaluation appointments and focus on weight loss.

## 2023-02-06 NOTE — Progress Notes (Signed)
Hannah Klein    [id number]    1975-08-30  Primary Care Physician:Clark, Keane Scrape, NP  Referring Physician: Doreene Nest, NP [address] 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 who  has a past medical history of Arthritis, Asthma, Environmental allergies, Esophageal dysphagia, Generalized anxiety disorder, GERD (gastroesophageal reflux disease), History of hiatal hernia, colonic polyps, Hypertension, Lung disorder, Migraines, MRSA (methicillin resistant Staphylococcus aureus), and Sleep apnea.   Sent to the ILD clinic by Dr. Delton Coombes for evaluation of interstitial lung disease.  She has history of severe persistent asthma for many years and is followed by Dr. Dellis Anes.  She had been on tezpire for 4 months in 2023.  Last dose of tezpire is in 2024.  In spite of the biologic she had significant dyspnea and noted to have a D-dimer.  She had a CTA which showed groundglass opacities concerning for pneumonitis/pneumonia and was admitted on 01/25/2022.  Treated with antibiotics, prednisone taper with minimal improvement.  She subsequently had surgical lung biopsy Dr. Cliffton Asters in early January 2024 with pathology showing nonspecific pneumonitis/pulmonary fibrosis.  Workup in the past shows borderline elevated rheumatoid factor and she has rheumatology evaluation pending for later this year.  Currently on 30 mg of prednisone which is started on March 29, 2022 with some improvement in symptoms  She is currently on performist and Pulmicort nebulizers.  History also notable for hiatal hernia, esophageal dysphagia and she follows with Dr. Marina Goodell.  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 Consulted with Dr. 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  No feather pillows or comforters ILD questionnaire 04/18/2022-negative Smoking history: 36-pack-year smoker.  Quit in 2009 Travel history: Originally from [place].  No significant recent travel Relevant family history: Brother does have asthma  Interim history: Discussed the use of AI scribe software for clinical note transcription with the patient, who gave verbal consent to proceed.  The patient, with a history of interstitial lung disease, presents for follow-up. She was previously on Cytoxan for six treatments, which was then tapered off in November. She started Imuran on November 8th, initially one a day, then increased to two a day after blood work confirmed stability. She is also on Bactrim, which was initially thought to cause heat intolerance, but this symptom is now attributed to the autoimmune disease.  The patient was previously on 10mg  of prednisone, which was reduced to 5mg . However, she experienced unspecified issues after about a week and returned to 10mg .  The patient is also being evaluated for a lung transplant at [facility]. She qualifies for the transplant list but is encouraged to lose weight. She has lost 20 pounds but has recently started gaining weight again. She is not yet on the transplant list. She is using less oxygen than before and is scheduled to see the transplant team again on the 26th of  this month.  The patient also reports ongoing pain in the right lung, possibly related to a biopsy performed in  January. The pain is noticeable when she is short of breath or exercising.    Outpatient Encounter Medications as of 02/06/2023  Medication Sig   acetaminophen (TYLENOL) 500 MG tablet Take 2 tablets (1,000 mg total) by mouth every 6 (six) hours as needed.   albuterol (VENTOLIN HFA) 108 (90 Base) MCG/ACT inhaler INHALE 1-2 PUFFS BY MOUTH EVERY 6 HOURS AS NEEDED FOR WHEEZE OR SHORTNESS OF BREATH   Azelastine HCl 137 MCG/SPRAY SOLN PLACE 2 SPRAYS INTO BOTH NOSTRILS 2 (TWO) TIMES DAILY. USE IN EACH NOSTRIL AS DIRECTED   B Complex-C (B-COMPLEX WITH VITAMIN C) tablet Take 1 tablet by mouth daily.   budesonide (PULMICORT) 0.25 MG/2ML nebulizer solution Take 0.25 mg by nebulization 2 (two) times daily.   Cholecalciferol (VITAMIN D3) 125 MCG (5000 UT) TABS Take 5,000 Units by mouth daily.   cycloSPORINE (RESTASIS) 0.05 % ophthalmic emulsion Place 1 drop into both eyes 2 (two) times daily.   desonide (DESOWEN) 0.05 % cream Apply 1 Application topically daily as needed.   diclofenac Sodium (VOLTAREN ARTHRITIS PAIN) 1 % GEL Apply 2 g topically daily as needed (Chest pain).   econazole nitrate 1 % cream Apply 1 Application topically daily as needed.   esomeprazole (NEXIUM) 40 MG capsule TAKE 1 CAPSULE (40 MG TOTAL) BY MOUTH 2 (TWO) TIMES DAILY. FOR HEARTBURN.   famotidine (PEPCID) 40 MG tablet Take 1 tablet (40 mg total) by mouth 2 (two) times daily. for heartburn.   fexofenadine (ALLEGRA) 180 MG tablet Take 180 mg by mouth daily.   hydrOXYzine (VISTARIL) 50 MG capsule TAKE 1 CAPSULE BY MOUTH ONCE OR TWICE DAILY AS NEEDED FOR ANXIETY   levocetirizine (XYZAL) 5 MG tablet Take 1 tablet (5 mg total) by mouth 2 (two) times daily. For allergies   Multiple Vitamin (MULTIVITAMIN) capsule Take 1 capsule by mouth daily.   neomycin-polymyxin b-dexamethasone (MAXITROL) 3.5-10000-0.1 OINT Place 1 Application into both eyes at bedtime.   Omega-3 Fatty Acids (OMEGA-3 FISH OIL) 300 MG CAPS Take 1,000 mg by mouth.    ondansetron (ZOFRAN) 4 MG tablet Take 1 tablet (4 mg total) by mouth every 8 (eight) hours as needed for nausea or vomiting.   oxyCODONE-acetaminophen (PERCOCET) 7.5-325 MG tablet Take 1 tablet by mouth every 6 (six) hours as needed for up to 20 doses for severe pain.   predniSONE (DELTASONE) 10 MG tablet TAKE 2 TABLETS BY MOUTH TWICE DAILY   pregabalin (LYRICA) 25 MG capsule Take 25 mg by mouth 2 (two) times daily.   rosuvastatin (CRESTOR) 10 MG tablet TAKE 1 TABLET BY MOUTH EVERY DAY FOR CHOLESTEROL   sucralfate (CARAFATE) 1 g tablet Take 1 tablet (1 g total) by mouth in the morning, at noon, and at bedtime.   sulfamethoxazole-trimethoprim (BACTRIM) 400-80 MG tablet Take 1 tablet by mouth daily.   vitamin B-12 (CYANOCOBALAMIN) 1000 MCG tablet Take 1,000 mcg by mouth daily.   vitamin C (ASCORBIC ACID) 500 MG tablet Take 500 mg by mouth daily.   vitamin E 180 MG (400 UNITS) capsule Take 400 Units by mouth daily.   formoterol (PERFOROMIST) 20 MCG/2ML nebulizer solution Take 20 mcg by nebulization 2 (two) times daily. (Patient not taking: Reported on 02/06/2023)   mometasone-formoterol (DULERA) 200-5 MCG/ACT AERO Inhale 2 puffs into the lungs every 12 (twelve) hours. (Patient not taking: Reported on 02/06/2023)   predniSONE (DELTASONE) 5 MG tablet Take  1 tablet (5 mg total) by mouth daily with breakfast. (Patient not taking: Reported on 02/06/2023)   [DISCONTINUED] clobetasol ointment (TEMOVATE) 0.05 % Apply 1 Application topically 2 (two) times daily. (Patient not taking: Reported on 02/06/2023)   [DISCONTINUED] dapsone 25 MG tablet TAKE 2 TABLETS BY MOUTH 2 TIMES DAILY. (Patient not taking: Reported on 02/06/2023)   [DISCONTINUED] formoterol (PERFOROMIST) 20 MCG/2ML nebulizer solution Take 2 mLs (20 mcg total) by nebulization 2 (two) times daily. (Patient taking differently: Take 20 mcg by nebulization daily as needed (Shortness of breath).)   [DISCONTINUED] mycophenolate (CELLCEPT) 500 MG tablet  Take 3 tablets (1,500 mg total) by mouth 2 (two) times daily. (Patient not taking: Reported on 02/06/2023)   No facility-administered encounter medications on file as of 02/06/2023.   Physical Exam: Blood pressure 112/64, pulse 72, temperature (!) 97.3 F (36.3 C), temperature source Temporal, height 5\' 9"  (1.753 m), weight 233 lb 4.8 oz (105.8 kg), SpO2 94%. Gen:      No acute distress HEENT:  EOMI, sclera anicteric Neck:     No masses; no thyromegaly Lungs:    Clear to auscultation bilaterally; normal respiratory effort CV:         Regular rate and rhythm; no murmurs Abd:      + bowel sounds; soft, non-tender; no palpable masses, no distension Ext:    No edema; adequate peripheral perfusion Skin:      Warm and dry; no rash Neuro: alert and oriented x 3 Psych: normal mood and affect   Data Reviewed: Imaging: High resolution CT 04/11/2020-clear lungs with 4 mm lower lobe nodule Super D CT 04/07/2021-patchy groundglass opacities in the mid to lower lungs.  Stable lung nodules CTA 01/25/2022-no PE, moderate bilateral peripheral predominant groundglass opacities CT chest 02/22/2022-progressive interstitial lung disease with patchy groundglass, subpleural reticulation, traction bronchiectasis bilaterally with basal gradient.  Probable UIP pattern High resolution CT 04/25/2022-increasingly organized return of groundglass opacities with subpleural reticular densities, traction bronchiectasis.  Indeterminate for UIP High resolution CT 09/07/2022-slight interval progression of interstitial lung disease CT head and neck 09/07/2022-unremarkable I have reviewed the images personally.  PFTs: 03/07/2022 FVC 1.77 [41%], FEV1 1.60 [47%], F/F91, DLCO 7.03 [28%] Severe restrictive physiology.  Severe diffusion impairment.  Labs: Rheumatoid factor 01/25/2022- 41 ANA 01/25/2022-negative CCP 01/26/2022 less than 1 ANCA 08/03/2021-negative  CTD serologies 04/18/2022 ANA 1: 1280 ENA RNP 1.2, SSA greater  than 200  Surgical lung biopsy 03/08/2022 No unclassifiable interstitial, chronic fibroinflammatory process.  Monitoring labs CMP 07/30/2022-alk phos 38 WBC 07/30/2022-WBC 16.8, 87.6 neutrophils  Sleep: Overnight oximetry on CPAP, room air dated 06/20/2022 Duration of study 8 hours 40 minutes Time spent less than 88% - 2 hours, 50 minutes Nadir O2 sat of 74%  Cardiac: Echocardiogram 01/26/2022-LVEF 60-65%, normal RV systolic size and function,  Assessment:  Connective tissue disease ILD, Sjogren's syndrome Seen as a second opinion at [facility] who feels that she may have antisynthetase syndrome as well. 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  Due to rapid progression on CT scan and pathology findings with fibroblastic foci we felt that the phenotype is aggressive and recommended aggressive immunotherapy and addition of antifibrotic therapy.  Unfortunately Rituxan has been denied in spite of multiple appeals.  As she has aggressive disease with rapid worsening we give her Cytoxan. We applied for Ofev but has been denied by insurance.  This is temporarily on hold until  we can get her immunosuppression optimized  She has received 6 doses of Cytoxan  Stable on Imuran 150mg  daily, with plans to increase to 200mg  daily if blood work is stable. Prednisone dose increased from 5mg  to 10mg  due to symptoms. -Continue Imuran 150mg  daily, increase to 200mg  daily after 02/27/2023 if blood work is stable.  This is managed by her rheumatologist at [facility].  Blood work earlier this month was stable. -Change Prednisone prescription to 10mg  daily. -Order CT scan in January or February 2025.  Severe persistent asthma Off tezspire since November 2024 Continue Dulera.  She prefers the inhaler over to performist and Pulmicort nebulizer  OSA Continue CPAP with oxygen  Lung  Transplant Evaluation Patient qualifies for transplant but has not been placed on the list yet. Weight loss is recommended. -Continue with transplant evaluation appointments. -Encourage weight loss.  Prophylaxis for Pneumocystis Pneumonia Tolerating Bactrim without adverse reactions. -Continue Bactrim.  Follow-up in 6 months.    Plan/Recommendations: Follow labs for monitoring Continue Imuran, prednisone CT scan in February Follow-up in 6 months  Chilton Greathouse MD [facility] 02/06/2023, 2:30 PM  CC: Doreene Nest, NP

## 2023-02-07 ENCOUNTER — Encounter (HOSPITAL_COMMUNITY)
Admission: RE | Admit: 2023-02-07 | Discharge: 2023-02-07 | Disposition: A | Discharge status: Home / Self Care | Payer: Commercial Managed Care - HMO | Source: Ambulatory Visit | Attending: Pulmonary Disease | Admitting: Pulmonary Disease

## 2023-02-07 DIAGNOSIS — J849 Interstitial pulmonary disease, unspecified: Secondary | ICD-10-CM | POA: Diagnosis not present

## 2023-02-07 NOTE — Progress Notes (Signed)
Daily Session Note  Patient Details  Name: NAYDINE ROGNESS MRN: [id number] Date of Birth: 1976/01/17 Referring Provider:   Doristine Devoid Pulmonary Rehab Walk Test from 08/15/2022 in [facility], [facility]  Referring Provider Soskis  [sees [name]]       Encounter Date: 02/07/2023  Check In:  Session Check In - 02/07/23 0818       Check-In   Supervising physician immediately available to respond to emergencies [facility] MD immediately available    Physician(s) Joni Reining, NP    Location [facility]    Staff Present Raford Pitcher, MS, ACSM-CEP, Exercise Physiologist;Randi Dionisio Paschal, ACSM-CEP, Exercise Physiologist;Casey Synthia Innocent, RN, BSN    Virtual Visit No    Medication changes reported     No    Fall or balance concerns reported    No    Tobacco Cessation No Change    Warm-up and Cool-down Performed as group-led instruction    Resistance Training Performed Yes    VAD Patient? No    PAD/SET Patient? No      Pain Assessment   Currently in Pain? No/denies    Multiple Pain Sites No             Capillary Blood Glucose: No results found for this or any previous visit (from the past 24 hours).    Social History   Tobacco Use  Smoking Status Former   Current packs/day: 0.00   Average packs/day: 2.0 packs/day for 18.0 years (36.1 ttl pk-yrs)   Types: Cigarettes   Start date: 44   Quit date: 03/16/2007   Years since quitting: 15.9   Passive exposure: Current  Smokeless Tobacco Never    Goals Met:  Independence with exercise equipment Exercise tolerated well No report of concerns or symptoms today Strength training completed today  Goals Unmet:  Not Applicable  Comments: Service time is from 0811 to 0921    Dr. Mechele Collin is Medical Director for Pulmonary Rehab at [facility].

## 2023-02-12 ENCOUNTER — Encounter (HOSPITAL_COMMUNITY)
Admission: RE | Admit: 2023-02-12 | Discharge: 2023-02-12 | Disposition: A | Discharge status: Home / Self Care | Payer: Commercial Managed Care - HMO | Source: Ambulatory Visit | Attending: Pulmonary Disease | Admitting: Pulmonary Disease

## 2023-02-12 DIAGNOSIS — J849 Interstitial pulmonary disease, unspecified: Secondary | ICD-10-CM

## 2023-02-12 NOTE — Progress Notes (Signed)
Daily Session Note  Patient Details  Name: Hannah Klein MRN: [id number] Date of Birth: 07-28-1975 Referring Provider:   Doristine Devoid Pulmonary Rehab Walk Test from 08/15/2022 in [facility] for [facility], Vascular, & Lung Health  Referring Provider Soskis  [sees [name]]       Encounter Date: 02/12/2023  Check In:  Session Check In - 02/12/23 0830       Check-In   Supervising physician immediately available to respond to emergencies [facility] MD immediately available    Physician(s) Bernadene Person, NP    Location [facility]    Staff Present Raford Pitcher, MS, ACSM-CEP, Exercise Physiologist;Randi Dionisio Paschal, ACSM-CEP, Exercise Physiologist;Dellamae Rosamilia Katrinka Blazing, RT    Virtual Visit No    Medication changes reported     No    Fall or balance concerns reported    No    Tobacco Cessation No Change    Warm-up and Cool-down Performed as group-led instruction    Resistance Training Performed Yes    VAD Patient? No    PAD/SET Patient? No      Pain Assessment   Currently in Pain? No/denies    Multiple Pain Sites No             Capillary Blood Glucose: No results found for this or any previous visit (from the past 24 hours).    Social History   Tobacco Use  Smoking Status Former   Current packs/day: 0.00   Average packs/day: 2.0 packs/day for 18.0 years (36.1 ttl pk-yrs)   Types: Cigarettes   Start date: 76   Quit date: 03/16/2007   Years since quitting: 15.9   Passive exposure: Current  Smokeless Tobacco Never    Goals Met:  Proper associated with RPD/PD & O2 Sat Independence with exercise equipment Exercise tolerated well No report of concerns or symptoms today Strength training completed today  Goals Unmet:  Not Applicable  Comments: Service time is from 0810 to 0914.    Dr. Mechele Collin is Medical Director for Pulmonary Rehab at [facility].

## 2023-02-14 ENCOUNTER — Encounter (HOSPITAL_COMMUNITY)
Admission: RE | Admit: 2023-02-14 | Discharge: 2023-02-14 | Disposition: A | Discharge status: Home / Self Care | Payer: Commercial Managed Care - HMO | Source: Ambulatory Visit | Attending: Pulmonary Disease | Admitting: Pulmonary Disease

## 2023-02-14 DIAGNOSIS — J849 Interstitial pulmonary disease, unspecified: Secondary | ICD-10-CM

## 2023-02-14 NOTE — Progress Notes (Signed)
Daily Session Note  Patient Details  Name: Hannah Klein MRN: [id number] Date of Birth: [birth year] Referring Provider:   Doristine Devoid Pulmonary Rehab Walk Test from 08/15/2022 in [facility] for [facility], Vascular, [facility] Lung [facility]  Referring Provider Soskis  [sees [name]]       Encounter Date: 02/14/2023  Check In:  Session Check In - 02/14/23 0827       Check-In   Supervising physician immediately available to respond to emergencies [facility] MD immediately available    Physician(s) Robin Searing, NP    Location [facility]    Staff Present Raford Pitcher, MS, ACSM-CEP, Exercise Physiologist;Infinity [place] Synthia Innocent, RN, BSN    Virtual Visit No    Medication changes reported     No    Fall or balance concerns reported    No    Tobacco Cessation No Change    Warm-up and Cool-down Performed as group-led instruction    Resistance Training Performed Yes    VAD Patient? No    PAD/SET Patient? No      Pain Assessment   Currently in Pain? No/denies    Pain Score 0-No pain    Multiple Pain Sites No             Capillary Blood Glucose: No results found for this or any previous visit (from the past 24 hours).    Social History   Tobacco Use  Smoking Status Former   Current packs/day: 0.00   Average packs/day: 2.0 packs/day for 18.0 years (36.1 ttl pk-yrs)   Types: Cigarettes   Start date: 63   Quit date: 03/16/2007   Years since quitting: 15.9   Passive exposure: Current  Smokeless Tobacco Never    Goals Met:  Proper associated with RPD/PD & O2 Sat Independence with exercise equipment Exercise tolerated well No report of concerns or symptoms today Strength training completed today  Goals Unmet:  Not Applicable  Comments: Service time is from 0809 to 0931.    Dr. Mechele Collin is Medical Director for Pulmonary Rehab at [facility].

## 2023-02-19 ENCOUNTER — Encounter (HOSPITAL_COMMUNITY)
Admission: RE | Admit: 2023-02-19 | Discharge: 2023-02-19 | Disposition: A | Discharge status: Home / Self Care | Payer: Commercial Managed Care - HMO | Source: Ambulatory Visit | Attending: Pulmonary Disease | Admitting: Pulmonary Disease

## 2023-02-19 VITALS — Wt 227.1 lb

## 2023-02-19 DIAGNOSIS — J849 Interstitial pulmonary disease, unspecified: Secondary | ICD-10-CM

## 2023-02-19 NOTE — Progress Notes (Signed)
Pulmonary Individual Treatment Plan  Patient Details  Name: Hannah Klein MRN: [id number] Date of Birth: Aug 26, 1975 Referring Provider:   Doristine Devoid Pulmonary Rehab Walk Test from 08/15/2022 in [facility]  Referring Provider Soskis  [sees [name]]       Initial Encounter Date:  Flowsheet Row Pulmonary Rehab Walk Test from 08/15/2022 in [facility]  Date 08/15/22       Visit Diagnosis: Interstitial lung disease (HCC)  Patient's Home Medications on Admission:   Current Outpatient Medications:    acetaminophen (TYLENOL) 500 MG tablet, Take 2 tablets (1,000 mg total) by mouth every 6 (six) hours as needed., Disp: 30 tablet, Rfl: 0   albuterol (VENTOLIN HFA) 108 (90 Base) MCG/ACT inhaler, INHALE 1-2 PUFFS BY MOUTH EVERY 6 HOURS AS NEEDED FOR WHEEZE OR SHORTNESS OF BREATH, Disp: 18 g, Rfl: 0   Azelastine HCl 137 MCG/SPRAY SOLN, PLACE 2 SPRAYS INTO BOTH NOSTRILS 2 (TWO) TIMES DAILY. USE IN EACH NOSTRIL AS DIRECTED, Disp: 30 mL, Rfl: 5   B Complex-C (B-COMPLEX WITH VITAMIN C) tablet, Take 1 tablet by mouth daily., Disp: , Rfl:    budesonide (PULMICORT) 0.25 MG/2ML nebulizer solution, Take 0.25 mg by nebulization 2 (two) times daily., Disp: , Rfl:    Cholecalciferol (VITAMIN D3) 125 MCG (5000 UT) TABS, Take 5,000 Units by mouth daily., Disp: , Rfl:    cycloSPORINE (RESTASIS) 0.05 % ophthalmic emulsion, Place 1 drop into both eyes 2 (two) times daily., Disp: , Rfl:    desonide (DESOWEN) 0.05 % cream, Apply 1 Application topically daily as needed., Disp: , Rfl:    diclofenac Sodium (VOLTAREN ARTHRITIS PAIN) 1 % GEL, Apply 2 g topically daily as needed (Chest pain)., Disp: , Rfl:    econazole nitrate 1 % cream, Apply 1 Application topically daily as needed., Disp: , Rfl:    esomeprazole (NEXIUM) 40 MG capsule, TAKE 1 CAPSULE (40 MG TOTAL) BY MOUTH 2 (TWO) TIMES DAILY. FOR  HEARTBURN., Disp: 180 capsule, Rfl: 2   famotidine (PEPCID) 40 MG tablet, Take 1 tablet (40 mg total) by mouth 2 (two) times daily. for heartburn., Disp: 180 tablet, Rfl: 2   fexofenadine (ALLEGRA) 180 MG tablet, Take 180 mg by mouth daily., Disp: , Rfl:    formoterol (PERFOROMIST) 20 MCG/2ML nebulizer solution, Take 20 mcg by nebulization 2 (two) times daily. (Patient not taking: Reported on 02/06/2023), Disp: , Rfl:    hydrOXYzine (VISTARIL) 50 MG capsule, TAKE 1 CAPSULE BY MOUTH ONCE OR TWICE DAILY AS NEEDED FOR ANXIETY, Disp: 180 capsule, Rfl: 2   levocetirizine (XYZAL) 5 MG tablet, Take 1 tablet (5 mg total) by mouth 2 (two) times daily. For allergies, Disp: 180 tablet, Rfl: 3   mometasone-formoterol (DULERA) 200-5 MCG/ACT AERO, Inhale 2 puffs into the lungs every 12 (twelve) hours. (Patient not taking: Reported on 02/06/2023), Disp: 13 g, Rfl: 5   Multiple Vitamin (MULTIVITAMIN) capsule, Take 1 capsule by mouth daily., Disp: , Rfl:    neomycin-polymyxin b-dexamethasone (MAXITROL) 3.5-10000-0.1 OINT, Place 1 Application into both eyes at bedtime., Disp: , Rfl:    Omega-3 Fatty Acids (OMEGA-3 FISH OIL) 300 MG CAPS, Take 1,000 mg by mouth., Disp: , Rfl:    ondansetron (ZOFRAN) 4 MG tablet, Take 1 tablet (4 mg total) by mouth every 8 (eight) hours as needed for nausea or vomiting., Disp: 20 tablet, Rfl: 0   oxyCODONE-acetaminophen (PERCOCET) 7.5-325 MG tablet,  Take 1 tablet by mouth every 6 (six) hours as needed for up to 20 doses for severe pain., Disp: 20 tablet, Rfl: 0   predniSONE (DELTASONE) 10 MG tablet, TAKE 2 TABLETS BY MOUTH TWICE DAILY, Disp: 120 tablet, Rfl: 3   predniSONE (DELTASONE) 5 MG tablet, Take 1 tablet (5 mg total) by mouth daily with breakfast. (Patient not taking: Reported on 02/06/2023), Disp: 90 tablet, Rfl: 1   pregabalin (LYRICA) 25 MG capsule, Take 25 mg by mouth 2 (two) times daily., Disp: , Rfl:    rosuvastatin (CRESTOR) 10 MG tablet, TAKE 1 TABLET BY MOUTH EVERY DAY FOR  CHOLESTEROL, Disp: 90 tablet, Rfl: 2   sucralfate (CARAFATE) 1 g tablet, Take 1 tablet (1 g total) by mouth in the morning, at noon, and at bedtime., Disp: 90 tablet, Rfl: 5   sulfamethoxazole-trimethoprim (BACTRIM) 400-80 MG tablet, Take 1 tablet by mouth daily., Disp: , Rfl:    vitamin B-12 (CYANOCOBALAMIN) 1000 MCG tablet, Take 1,000 mcg by mouth daily., Disp: , Rfl:    vitamin C (ASCORBIC ACID) 500 MG tablet, Take 500 mg by mouth daily., Disp: , Rfl:    vitamin E 180 MG (400 UNITS) capsule, Take 400 Units by mouth daily., Disp: , Rfl:   Past Medical History: Past Medical History:  Diagnosis Date   Arthritis    Asthma    Environmental allergies    Esophageal dysphagia    Generalized anxiety disorder    GERD (gastroesophageal reflux disease)    History of hiatal hernia    Hx of colonic polyps    Hypertension    Lung disorder    Patient states she has two unidentified ILD   Migraines    MRSA (methicillin resistant Staphylococcus aureus)    Lt upper arm  approx 8 years ago   Sleep apnea     Tobacco Use: Social History   Tobacco Use  Smoking Status Former   Current packs/day: 0.00   Average packs/day: 2.0 packs/day for 18.0 years (36.1 ttl pk-yrs)   Types: Cigarettes   Start date: 76   Quit date: 03/16/2007   Years since quitting: 15.9   Passive exposure: Current  Smokeless Tobacco Never    Labs: Review Flowsheet  More data exists      Latest Ref Rng & Units 04/07/2019 07/07/2019 03/17/2020 06/09/2021 05/31/2022  Labs for ITP Cardiac and Pulmonary Rehab  Cholestrol 0 - 200 mg/dL 161  096  045  409  811   LDL (calc) 0 - 99 mg/dL 78  914  94  - -  Direct LDL mg/dL - - - 782.9  562.1   HDL-C >39.00 mg/dL 30.86  57.84  69.62  95.28  66.80   Trlycerides 0.0 - 149.0 mg/dL 413.2  440.1  027.2  536.6  252.0   Hemoglobin A1c 4.6 - 6.5 % - - 5.5  5.6  5.5     Capillary Blood Glucose: No results found for: "GLUCAP"   Pulmonary Assessment Scores:  UCSD: Self-administered  rating of dyspnea associated with activities of daily living (ADLs) 6-point scale (0 = "not at all" to 5 = "maximal or unable to do because of breathlessness")  Scoring Scores range from 0 to 120.  Minimally important difference is 5 units  CAT: CAT can identify the health impairment of COPD patients and is better correlated with disease progression.  CAT has a scoring range of zero to 40. The CAT score is classified into four groups of low (less than 10),  medium (10 - 20), high (21-30) and very high (31-40) based on the impact level of disease on health status. A CAT score over 10 suggests significant symptoms.  A worsening CAT score could be explained by an exacerbation, poor medication adherence, poor inhaler technique, or progression of COPD or comorbid conditions.  CAT MCID is 2 points  mMRC: mMRC (Modified Medical Research Council) Dyspnea Scale is used to assess the degree of baseline functional disability in patients of respiratory disease due to dyspnea. No minimal important difference is established. A decrease in score of 1 point or greater is considered a positive change.   Pulmonary Function Assessment:   Exercise Target Goals: Exercise Program Goal: Individual exercise prescription set using results from initial 6 min walk test and THRR while considering  patient's activity barriers and safety.   Exercise Prescription Goal: Initial exercise prescription builds to 30-45 minutes a day of aerobic activity, 2-3 days per week.  Home exercise guidelines will be given to patient during program as part of exercise prescription that the participant will acknowledge.  Activity Barriers & Risk Stratification:   6 Minute Walk:   Oxygen Initial Assessment:   Oxygen Re-Evaluation:  Oxygen Re-Evaluation     Row Name 08/31/22 1015 09/21/22 1351 10/25/22 1620 11/20/22 1631 12/19/22 1032     Program Oxygen Prescription   Program Oxygen Prescription Continuous Continuous Continuous  Continuous Continuous   Liters per minute 8 10 10 10 10    Comments -- 94% on 10L 94% on 10L -- --     Home Oxygen   Home Oxygen Device E-Tanks;Home Concentrator E-Tanks;Home Concentrator E-Tanks;Home Concentrator E-Tanks;Home Concentrator E-Tanks;Home Concentrator   Sleep Oxygen Prescription Continuous Continuous Continuous Continuous Continuous   Liters per minute 4 4 4 4 4    Home Exercise Oxygen Prescription Continuous Continuous Continuous Continuous Continuous   Liters per minute 8 8 8 8 8    Home Resting Oxygen Prescription Continuous Continuous Continuous Continuous Continuous   Liters per minute 2 2 2 2 2    Compliance with Home Oxygen Use Yes Yes Yes Yes Yes     Goals/Expected Outcomes   Short Term Goals To learn and exhibit compliance with exercise, home and travel O2 prescription;To learn and understand importance of maintaining oxygen saturations>88%;To learn and demonstrate proper use of respiratory medications;To learn and understand importance of monitoring SPO2 with pulse oximeter and demonstrate accurate use of the pulse oximeter.;To learn and demonstrate proper pursed lip breathing techniques or other breathing techniques.  To learn and exhibit compliance with exercise, home and travel O2 prescription;To learn and understand importance of maintaining oxygen saturations>88%;To learn and demonstrate proper use of respiratory medications;To learn and understand importance of monitoring SPO2 with pulse oximeter and demonstrate accurate use of the pulse oximeter.;To learn and demonstrate proper pursed lip breathing techniques or other breathing techniques.  To learn and exhibit compliance with exercise, home and travel O2 prescription;To learn and understand importance of maintaining oxygen saturations>88%;To learn and demonstrate proper use of respiratory medications;To learn and understand importance of monitoring SPO2 with pulse oximeter and demonstrate accurate use of the pulse  oximeter.;To learn and demonstrate proper pursed lip breathing techniques or other breathing techniques.  To learn and exhibit compliance with exercise, home and travel O2 prescription;To learn and understand importance of maintaining oxygen saturations>88%;To learn and demonstrate proper use of respiratory medications;To learn and understand importance of monitoring SPO2 with pulse oximeter and demonstrate accurate use of the pulse oximeter.;To learn and demonstrate proper pursed lip breathing techniques or  other breathing techniques.  To learn and exhibit compliance with exercise, home and travel O2 prescription;To learn and understand importance of maintaining oxygen saturations>88%;To learn and demonstrate proper use of respiratory medications;To learn and understand importance of monitoring SPO2 with pulse oximeter and demonstrate accurate use of the pulse oximeter.;To learn and demonstrate proper pursed lip breathing techniques or other breathing techniques.    Long  Term Goals Exhibits compliance with exercise, home  and travel O2 prescription;Verbalizes importance of monitoring SPO2 with pulse oximeter and return demonstration;Maintenance of O2 saturations>88%;Exhibits proper breathing techniques, such as pursed lip breathing or other method taught during program session;Compliance with respiratory medication;Demonstrates proper use of MDI's Exhibits compliance with exercise, home  and travel O2 prescription;Verbalizes importance of monitoring SPO2 with pulse oximeter and return demonstration;Maintenance of O2 saturations>88%;Exhibits proper breathing techniques, such as pursed lip breathing or other method taught during program session;Compliance with respiratory medication;Demonstrates proper use of MDI's Exhibits compliance with exercise, home  and travel O2 prescription;Verbalizes importance of monitoring SPO2 with pulse oximeter and return demonstration;Maintenance of O2 saturations>88%;Exhibits proper  breathing techniques, such as pursed lip breathing or other method taught during program session;Compliance with respiratory medication;Demonstrates proper use of MDI's Exhibits compliance with exercise, home  and travel O2 prescription;Verbalizes importance of monitoring SPO2 with pulse oximeter and return demonstration;Maintenance of O2 saturations>88%;Exhibits proper breathing techniques, such as pursed lip breathing or other method taught during program session;Compliance with respiratory medication;Demonstrates proper use of MDI's Exhibits compliance with exercise, home  and travel O2 prescription;Verbalizes importance of monitoring SPO2 with pulse oximeter and return demonstration;Maintenance of O2 saturations>88%;Exhibits proper breathing techniques, such as pursed lip breathing or other method taught during program session;Compliance with respiratory medication;Demonstrates proper use of MDI's   Goals/Expected Outcomes Compliance and understanding of oxygen saturation monitoring and breathing techniques to decrease shortness of breath. Compliance and understanding of oxygen saturation monitoring and breathing techniques to decrease shortness of breath. Compliance and understanding of oxygen saturation monitoring and breathing techniques to decrease shortness of breath. Compliance and understanding of oxygen saturation monitoring and breathing techniques to decrease shortness of breath. Compliance and understanding of oxygen saturation monitoring and breathing techniques to decrease shortness of breath.    Row Name 01/16/23 1040 02/07/23 1607           Program Oxygen Prescription   Program Oxygen Prescription Continuous Continuous      Liters per minute 6 6      Comments 95% on 8L, 88-90% on 6L 4L on recumbent elliptical. 6L on treadmill        Home Oxygen   Home Oxygen Device E-Tanks;Home Concentrator E-Tanks;Home Concentrator      Sleep Oxygen Prescription Continuous Continuous      Liters  per minute 4 4      Home Exercise Oxygen Prescription Continuous Continuous      Liters per minute 8 8      Home Resting Oxygen Prescription Continuous Continuous      Liters per minute 2 2      Compliance with Home Oxygen Use Yes Yes        Goals/Expected Outcomes   Short Term Goals To learn and exhibit compliance with exercise, home and travel O2 prescription;To learn and understand importance of maintaining oxygen saturations>88%;To learn and demonstrate proper use of respiratory medications;To learn and understand importance of monitoring SPO2 with pulse oximeter and demonstrate accurate use of the pulse oximeter.;To learn and demonstrate proper pursed lip breathing techniques or other breathing techniques.  To learn  and exhibit compliance with exercise, home and travel O2 prescription;To learn and understand importance of maintaining oxygen saturations>88%;To learn and demonstrate proper use of respiratory medications;To learn and understand importance of monitoring SPO2 with pulse oximeter and demonstrate accurate use of the pulse oximeter.;To learn and demonstrate proper pursed lip breathing techniques or other breathing techniques.       Long  Term Goals Exhibits compliance with exercise, home  and travel O2 prescription;Verbalizes importance of monitoring SPO2 with pulse oximeter and return demonstration;Maintenance of O2 saturations>88%;Exhibits proper breathing techniques, such as pursed lip breathing or other method taught during program session;Compliance with respiratory medication;Demonstrates proper use of MDI's Exhibits compliance with exercise, home  and travel O2 prescription;Verbalizes importance of monitoring SPO2 with pulse oximeter and return demonstration;Maintenance of O2 saturations>88%;Exhibits proper breathing techniques, such as pursed lip breathing or other method taught during program session;Compliance with respiratory medication;Demonstrates proper use of MDI's       Goals/Expected Outcomes Compliance and understanding of oxygen saturation monitoring and breathing techniques to decrease shortness of breath. Compliance and understanding of oxygen saturation monitoring and breathing techniques to decrease shortness of breath.               Oxygen Discharge (Final Oxygen Re-Evaluation):  Oxygen Re-Evaluation - 02/07/23 1607       Program Oxygen Prescription   Program Oxygen Prescription Continuous    Liters per minute 6    Comments 4L on recumbent elliptical. 6L on treadmill      Home Oxygen   Home Oxygen Device E-Tanks;Home Concentrator    Sleep Oxygen Prescription Continuous    Liters per minute 4    Home Exercise Oxygen Prescription Continuous    Liters per minute 8    Home Resting Oxygen Prescription Continuous    Liters per minute 2    Compliance with Home Oxygen Use Yes      Goals/Expected Outcomes   Short Term Goals To learn and exhibit compliance with exercise, home and travel O2 prescription;To learn and understand importance of maintaining oxygen saturations>88%;To learn and demonstrate proper use of respiratory medications;To learn and understand importance of monitoring SPO2 with pulse oximeter and demonstrate accurate use of the pulse oximeter.;To learn and demonstrate proper pursed lip breathing techniques or other breathing techniques.     Long  Term Goals Exhibits compliance with exercise, home  and travel O2 prescription;Verbalizes importance of monitoring SPO2 with pulse oximeter and return demonstration;Maintenance of O2 saturations>88%;Exhibits proper breathing techniques, such as pursed lip breathing or other method taught during program session;Compliance with respiratory medication;Demonstrates proper use of MDI's    Goals/Expected Outcomes Compliance and understanding of oxygen saturation monitoring and breathing techniques to decrease shortness of breath.             Initial Exercise Prescription:   Perform Capillary  Blood Glucose checks as needed.  Exercise Prescription Changes:   Exercise Prescription Changes     Row Name 09/04/22 1200 09/18/22 1200 10/02/22 1200 10/16/22 1200 10/30/22 1100     Response to Exercise   Blood Pressure (Admit) 122/70 124/62 118/62 110/64 118/60   Blood Pressure (Exercise) 146/60 150/60 134/62 120/64 128/62   Blood Pressure (Exit) 108/66 118/60 112/68 110/60 100/68   Heart Rate (Admit) 75 bpm 73 bpm 71 bpm 62 bpm 69 bpm   Heart Rate (Exercise) 105 bpm 100 bpm 110 bpm 107 bpm 84 bpm   Heart Rate (Exit) 81 bpm 76 bpm 83 bpm 77 bpm 72 bpm   Oxygen Saturation (Admit) 96 %  97 % 96 % 98 % 99 %   Oxygen Saturation (Exercise) 92 % 94 % 89 % 85 %  85% on 8L, 90% on 10L 99 %   Oxygen Saturation (Exit) 97 % 97 % 96 % 95 % 97 %   Rating of Perceived Exertion (Exercise) 13 14 13 13 11    Perceived Dyspnea (Exercise) 2 2 2 2 1    Symptoms -- DOE -- -- --   Duration Progress to 30 minutes of  aerobic without signs/symptoms of physical distress Continue with 30 min of aerobic exercise without signs/symptoms of physical distress. Continue with 30 min of aerobic exercise without signs/symptoms of physical distress. Continue with 30 min of aerobic exercise without signs/symptoms of physical distress. Continue with 30 min of aerobic exercise without signs/symptoms of physical distress.   Intensity THRR unchanged THRR unchanged THRR unchanged THRR unchanged THRR unchanged     Progression   Progression Continue to progress workloads to maintain intensity without signs/symptoms of physical distress. Continue to progress workloads to maintain intensity without signs/symptoms of physical distress. Continue to progress workloads to maintain intensity without signs/symptoms of physical distress. Continue to progress workloads to maintain intensity without signs/symptoms of physical distress. Continue to progress workloads to maintain intensity without signs/symptoms of physical distress.   Average  METs 2.5 -- -- -- --     Resistance Training   Training Prescription Yes Yes Yes Yes Yes   Weight blue bands blue bands blue bands blue bands blue bands   Reps 10-15 10-15 10-15 10-15 10-15   Time 10 Minutes 10 Minutes 10 Minutes 10 Minutes 10 Minutes     Interval Training   Interval Training No -- -- -- --     Oxygen   Oxygen Continuous Continuous Continuous Continuous Continuous   Liters 10 10 8-10 8-10 8-10     Treadmill   MPH 2 2 2  2.4 2.5   Grade 0 1 1 1.5 1.5   Minutes 15 15 15 15 15    METs 2.53 2.81 2.81 3.33 3.43     Recumbant Elliptical   Level 2 3 3 3 4    RPM -- 40 -- -- --   Minutes 15 15 15 15 15    METs 2.5 2.6 2.3 3.1 2.9     Oxygen   Maintain Oxygen Saturation 88% or higher 88% or higher 88% or higher 88% or higher 88% or higher    Row Name 11/15/22 1221 11/27/22 1200 12/11/22 0900 12/25/22 0900 01/08/23 0900     Response to Exercise   Blood Pressure (Admit) 120/58 -- 128/68 122/68 118/64   Blood Pressure (Exercise) -- -- 146/64 134/76 150/64   Blood Pressure (Exit) 98/52 -- 118/68 114/60 112/58   Heart Rate (Admit) 70 bpm -- 79 bpm 77 bpm 78 bpm   Heart Rate (Exercise) -- -- 97 bpm 108 bpm 100 bpm   Heart Rate (Exit) 88 bpm -- 74 bpm 79 bpm 95 bpm   Oxygen Saturation (Admit) 97 %  3L -- 98 %  4L 95 % 98 %   Oxygen Saturation (Exercise) 92 %  10L -- 94 %  10L 90 % 93 %   Oxygen Saturation (Exit) 92 %  3L -- 95 %  3L 91 % 96 %   Rating of Perceived Exertion (Exercise) 13 -- 13 11 11    Perceived Dyspnea (Exercise) 2 -- 1 1 1    Duration Continue with 30 min of aerobic exercise without signs/symptoms of physical distress. -- Continue  with 30 min of aerobic exercise without signs/symptoms of physical distress. Continue with 30 min of aerobic exercise without signs/symptoms of physical distress. Continue with 30 min of aerobic exercise without signs/symptoms of physical distress.   Intensity THRR unchanged -- THRR unchanged THRR unchanged THRR unchanged      Progression   Progression Continue to progress workloads to maintain intensity without signs/symptoms of physical distress. -- Continue to progress workloads to maintain intensity without signs/symptoms of physical distress. Continue to progress workloads to maintain intensity without signs/symptoms of physical distress. Continue to progress workloads to maintain intensity without signs/symptoms of physical distress.     Resistance Training   Training Prescription Yes -- Yes Yes Yes   Weight blue bands -- blue bands black bands black bands   Reps 10-15 -- 10-15 10-15 10-15   Time 10 Minutes -- 10 Minutes 10 Minutes 10 Minutes     Interval Training   Interval Training -- -- No Yes Yes   Equipment -- -- -- Treadmill Treadmill   Comments -- -- -- 1 min @ @ 2 mph + 0 incline 1 min @ 2. @ + 1     Oxygen   Oxygen Continuous -- Continuous Continuous Continuous   Liters --  3L with rest, 10L with exertion -- 3-10L 1-6 1-6     Treadmill   MPH 2.5 -- 2.5 3 3    Grade 2 -- 2 1 1    Minutes 15 -- 15 15 15    METs 3.6 -- 3.6 3.5 3.5     Recumbant Elliptical   Level 4 -- 4 5 5    Minutes 15 -- 15 15 15    METs 2.9 -- 3 3.1 3.5     Oxygen   Maintain Oxygen Saturation 88% or higher -- 88% or higher 88% or higher 88% or higher    Row Name 01/22/23 0900 02/05/23 0900           Response to Exercise   Blood Pressure (Admit) 112/64 110/68      Blood Pressure (Exercise) 136/66 156/70      Blood Pressure (Exit) 114/62 118/68      Heart Rate (Admit) 65 bpm 72 bpm      Heart Rate (Exercise) 101 bpm 101 bpm      Heart Rate (Exit) 74 bpm 77 bpm      Oxygen Saturation (Admit) 97 % 97 %      Oxygen Saturation (Exercise) 90 % 90 %      Oxygen Saturation (Exit) 95 % 96 %      Rating of Perceived Exertion (Exercise) 12 11      Perceived Dyspnea (Exercise) 1.5 1      Duration Continue with 30 min of aerobic exercise without signs/symptoms of physical distress. Continue with 30 min of aerobic  exercise without signs/symptoms of physical distress.      Intensity THRR unchanged THRR unchanged        Progression   Progression Continue to progress workloads to maintain intensity without signs/symptoms of physical distress. Continue to progress workloads to maintain intensity without signs/symptoms of physical distress.        Resistance Training   Training Prescription Yes Yes      Weight black bands black bands      Reps 10-15 10-15      Time 10 Minutes 10 Minutes        Interval Training   Interval Training Yes Yes      Equipment  Treadmill Treadmill      Comments 1 min @ 3.2 mph and 1.5% incline, 1 min @ 2.2 mph and 0% incline 1 min @ 3.2 mph and 1.5% incline, 1 min @ 2.2 mph and 0% incline.        Oxygen   Oxygen Continuous Continuous      Liters 1-6 1-6        Treadmill   MPH 3.2 3.2      Grade 1.5 1.5      Minutes 15 15      METs 3.9 3.9        Recumbant Elliptical   Level 5 2      Minutes 15 15      METs 3.3 3.2        Oxygen   Maintain Oxygen Saturation 88% or higher 88% or higher               Exercise Comments:   Exercise Comments     Row Name 10/02/22 1539           Exercise Comments Completed home [facility]. Hannah Klein is currently walking at home. She walks 1 non-rehab day/wk for 10-15 min/day. We discussed Lung Transplant guidelines and building to meeting those guidelines. I mentioned walking 3 non-rehab days/wk for at least 20 min/day. She agreed with my recommendations. I discussed increasing her walking time first and then frequency. I recommended doing a slow progression to meeting transplant guidelines. Hannah Klein agreed to my progression plan. We also discussed exercise strategies as Hannah Klein is walking outside. She is willing to try going to the local [employer] to avoid the heat and humidity. I discussed weather guidelines with [name]. She voiced understanding. She seems motivated to exercise and increase her functional capacity.                 Exercise Goals and Review:   Exercise Goals     Row Name 08/31/22 1011 09/21/22 1346 10/25/22 1617         Exercise Goals   Increase Physical Activity Yes Yes Yes     Intervention Provide advice, education, support and counseling about physical activity/exercise needs.;Develop an individualized exercise prescription for aerobic and resistive training based on initial evaluation findings, risk stratification, comorbidities and participant's personal goals. Provide advice, education, support and counseling about physical activity/exercise needs.;Develop an individualized exercise prescription for aerobic and resistive training based on initial evaluation findings, risk stratification, comorbidities and participant's personal goals. Provide advice, education, support and counseling about physical activity/exercise needs.;Develop an individualized exercise prescription for aerobic and resistive training based on initial evaluation findings, risk stratification, comorbidities and participant's personal goals.     Expected Outcomes Short Term: Attend rehab on a regular basis to increase amount of physical activity.;Long Term: Exercising regularly at least 3-5 days a week.;Long Term: Add in home exercise to make exercise part of routine and to increase amount of physical activity. Short Term: Attend rehab on a regular basis to increase amount of physical activity.;Long Term: Exercising regularly at least 3-5 days a week.;Long Term: Add in home exercise to make exercise part of routine and to increase amount of physical activity. Short Term: Attend rehab on a regular basis to increase amount of physical activity.;Long Term: Exercising regularly at least 3-5 days a week.;Long Term: Add in home exercise to make exercise part of routine and to increase amount of physical activity.     Increase Strength and Stamina Yes Yes Yes     Intervention Provide  advice, education, support and counseling about physical  activity/exercise needs.;Develop an individualized exercise prescription for aerobic and resistive training based on initial evaluation findings, risk stratification, comorbidities and participant's personal goals. Provide advice, education, support and counseling about physical activity/exercise needs.;Develop an individualized exercise prescription for aerobic and resistive training based on initial evaluation findings, risk stratification, comorbidities and participant's personal goals. Provide advice, education, support and counseling about physical activity/exercise needs.;Develop an individualized exercise prescription for aerobic and resistive training based on initial evaluation findings, risk stratification, comorbidities and participant's personal goals.     Expected Outcomes Short Term: Increase workloads from initial exercise prescription for resistance, speed, and METs.;Short Term: Perform resistance training exercises routinely during rehab and add in resistance training at home;Long Term: Improve cardiorespiratory fitness, muscular endurance and strength as measured by increased METs and functional capacity ( ) Short Term: Increase workloads from initial exercise prescription for resistance, speed, and METs.;Short Term: Perform resistance training exercises routinely during rehab and add in resistance training at home;Long Term: Improve cardiorespiratory fitness, muscular endurance and strength as measured by increased METs and functional capacity ( ) Short Term: Increase workloads from initial exercise prescription for resistance, speed, and METs.;Short Term: Perform resistance training exercises routinely during rehab and add in resistance training at home;Long Term: Improve cardiorespiratory fitness, muscular endurance and strength as measured by increased METs and functional capacity ( )     Able to understand and use rate of perceived exertion (RPE) scale Yes Yes Yes     Intervention  Provide education and explanation on how to use RPE scale Provide education and explanation on how to use RPE scale Provide education and explanation on how to use RPE scale     Expected Outcomes Short Term: Able to use RPE daily in rehab to express subjective intensity level;Long Term:  Able to use RPE to guide intensity level when exercising independently Short Term: Able to use RPE daily in rehab to express subjective intensity level;Long Term:  Able to use RPE to guide intensity level when exercising independently Short Term: Able to use RPE daily in rehab to express subjective intensity level;Long Term:  Able to use RPE to guide intensity level when exercising independently     Able to understand and use Dyspnea scale Yes Yes Yes     Intervention Provide education and explanation on how to use Dyspnea scale Provide education and explanation on how to use Dyspnea scale Provide education and explanation on how to use Dyspnea scale     Expected Outcomes Short Term: Able to use Dyspnea scale daily in rehab to express subjective sense of shortness of breath during exertion;Long Term: Able to use Dyspnea scale to guide intensity level when exercising independently Short Term: Able to use Dyspnea scale daily in rehab to express subjective sense of shortness of breath during exertion;Long Term: Able to use Dyspnea scale to guide intensity level when exercising independently Short Term: Able to use Dyspnea scale daily in rehab to express subjective sense of shortness of breath during exertion;Long Term: Able to use Dyspnea scale to guide intensity level when exercising independently     Knowledge and understanding of Target Heart Rate Range (THRR) Yes Yes Yes     Intervention Provide education and explanation of THRR including how the numbers were predicted and where they are located for reference Provide education and explanation of THRR including how the numbers were predicted and where they are located for  reference Provide education and explanation of THRR including how the numbers were  predicted and where they are located for reference     Expected Outcomes Short Term: Able to state/look up THRR;Long Term: Able to use THRR to govern intensity when exercising independently;Short Term: Able to use daily as guideline for intensity in rehab Short Term: Able to state/look up THRR;Long Term: Able to use THRR to govern intensity when exercising independently;Short Term: Able to use daily as guideline for intensity in rehab Short Term: Able to state/look up THRR;Long Term: Able to use THRR to govern intensity when exercising independently;Short Term: Able to use daily as guideline for intensity in rehab     Understanding of Exercise Prescription Yes Yes Yes     Intervention Provide education, explanation, and written materials on patient's individual exercise prescription Provide education, explanation, and written materials on patient's individual exercise prescription Provide education, explanation, and written materials on patient's individual exercise prescription     Expected Outcomes Short Term: Able to explain program exercise prescription;Long Term: Able to explain home exercise prescription to exercise independently Short Term: Able to explain program exercise prescription;Long Term: Able to explain home exercise prescription to exercise independently Short Term: Able to explain program exercise prescription;Long Term: Able to explain home exercise prescription to exercise independently              Exercise Goals Re-Evaluation :  Exercise Goals Re-Evaluation     Row Name 08/31/22 1011 09/21/22 1346 10/25/22 1618 11/20/22 1629 12/19/22 1029     Exercise Goal Re-Evaluation   Exercise Goals Review Increase Physical Activity;Able to understand and use Dyspnea scale;Understanding of Exercise Prescription;Increase Strength and Stamina;Knowledge and understanding of Target Heart Rate Range (THRR);Able  to understand and use rate of perceived exertion (RPE) scale Increase Physical Activity;Able to understand and use Dyspnea scale;Understanding of Exercise Prescription;Increase Strength and Stamina;Knowledge and understanding of Target Heart Rate Range (THRR);Able to understand and use rate of perceived exertion (RPE) scale Increase Physical Activity;Able to understand and use Dyspnea scale;Understanding of Exercise Prescription;Increase Strength and Stamina;Knowledge and understanding of Target Heart Rate Range (THRR);Able to understand and use rate of perceived exertion (RPE) scale Increase Physical Activity;Able to understand and use Dyspnea scale;Understanding of Exercise Prescription;Increase Strength and Stamina;Knowledge and understanding of Target Heart Rate Range (THRR);Able to understand and use rate of perceived exertion (RPE) scale Increase Physical Activity;Able to understand and use Dyspnea scale;Understanding of Exercise Prescription;Increase Strength and Stamina;Knowledge and understanding of Target Heart Rate Range (THRR);Able to understand and use rate of perceived exertion (RPE) scale   Comments Hannah Klein has completed 2 exercise sessions. She exercises for 15 min on the recumbent elliptical and treadmill. She averages 2.5 METs at level 2 on the recumbent elliptical and 2.53 METs at 2 mph and 0% incline. She performs the warmup and cooldown standing without limitations. It is too soon to note any discernable progressions. Will continue to monitor and progress as able. Hannah Klein has completed 8 exercise sessions. She exercises for 15 min on the recumbent elliptical and treadmill. She averages 2.5 METs at level 3 on the recumbent elliptical and 2.81 METs at 2 mph and 1% incline. She performs the warmup and cooldown standing without limitations. Hannah Klein has increased her workload for both exercise modes as METs have increased. She tolerates progressions well. Hannah Klein seems motivated to exercise and  improve her functional capacity. She enjoys class as she converses with other patients. Will continue to monitor and progress as able. Hannah Klein has completed 16 exercise sessions. She exercises for 15 min on the recumbent elliptical and treadmill. She  averages 3.0 METs at level 4 on the recumbent elliptical and 3.1 METs at 2.5 mph and 1.5% incline. She performs the warmup and cooldown standing without limitations. Hannah Klein is still progressing her workloads steadily. She is determined to reach her full functional capacity in preparation for the [facility] lung transplant program. Hannah Klein has starting walking around her neighborhood on a regular basis. Will continue to monitor and progress as able. Hannah Klein has completed 21 exercise sessions. She exercises for 15 min on the recumbent elliptical and treadmill. She averages 3.0 METs at level 4 on the recumbent elliptical and 3.3 METs at 2.5 mph and 2% incline. She performs the warmup and cooldown standing without limitations. Hannah Klein is still trying to progress workloads. She increased her workload slightly on the treadmill. I have asked for an update on her home exercise regimen. Hannah Klein is walking out home on a regular basis. Will continue to monitor and progress as able. Hannah Klein has completed 27 exercise sessions. She exercises for 15 min on the recumbent elliptical and treadmill. She averages 3.6 METs at level 4 on the recumbent elliptical and 3.4 METs at 2.5 mph and 2% incline. She performs the warmup and cooldown standing without limitations. Hannah Klein has not increased her workload for either exercise mode although METs have increased on the recumbent elliptical. Hannah Klein is also progressing in her home exercise as her time has increased. Will continue to monitor and progress as able.   Expected Outcomes Through exercise at rehab and home, the patient will decrease shortness of breath with daily activities and feel confident in carrying out an exercise regimen at home.  Through exercise at rehab and home, the patient will decrease shortness of breath with daily activities and feel confident in carrying out an exercise regimen at home. Through exercise at rehab and home, the patient will decrease shortness of breath with daily activities and feel confident in carrying out an exercise regimen at home. Through exercise at rehab and home, the patient will decrease shortness of breath with daily activities and feel confident in carrying out an exercise regimen at home. Through exercise at rehab and home, the patient will decrease shortness of breath with daily activities and feel confident in carrying out an exercise regimen at home.    Row Name 01/16/23 1036 02/07/23 1602           Exercise Goal Re-Evaluation   Exercise Goals Review Increase Physical Activity;Able to understand and use Dyspnea scale;Understanding of Exercise Prescription;Increase Strength and Stamina;Knowledge and understanding of Target Heart Rate Range (THRR);Able to understand and use rate of perceived exertion (RPE) scale Increase Physical Activity;Able to understand and use Dyspnea scale;Understanding of Exercise Prescription;Increase Strength and Stamina;Knowledge and understanding of Target Heart Rate Range (THRR);Able to understand and use rate of perceived exertion (RPE) scale      Comments Hannah Klein has completed 35 exercise sessions. She exercises for 15 min on the recumbent elliptical and treadmill. She averages 3.7 METs at level 5 on the recumbent elliptical and 3.7 METs at 3.2 mph and 1.5% incline. She performs the warmup and cooldown standing without limitations. Hannah Klein has increased her workload for both exercise modes as METs have slightly increased. Hannah Klein has also progressed to interval training. She tolerates interval training well. Will continue to monitor and progress as able. Hannah Klein has completed 41 exercise sessions. She exercises for 15 min on the recumbent elliptical and treadmill.  She averages 3.5 METs at level 5 on the recumbent elliptical and 2.8-3.5 METs at 3.2 mph and  1.5% incline and 2.2 mph. Will change interval training to increase higher intensity time and decrease lower intensity time. Hannah Klein performs the warmup and cooldown standing without limitations. Will continue to monitor and progress as able.      Expected Outcomes Through exercise at rehab and home, the patient will decrease shortness of breath with daily activities and feel confident in carrying out an exercise regimen at home. Through exercise at rehab and home, the patient will decrease shortness of breath with daily activities and feel confident in carrying out an exercise regimen at home.               Discharge Exercise Prescription (Final Exercise Prescription Changes):  Exercise Prescription Changes - 02/05/23 0900       Response to Exercise   Blood Pressure (Admit) 110/68    Blood Pressure (Exercise) 156/70    Blood Pressure (Exit) 118/68    Heart Rate (Admit) 72 bpm    Heart Rate (Exercise) 101 bpm    Heart Rate (Exit) 77 bpm    Oxygen Saturation (Admit) 97 %    Oxygen Saturation (Exercise) 90 %    Oxygen Saturation (Exit) 96 %    Rating of Perceived Exertion (Exercise) 11    Perceived Dyspnea (Exercise) 1    Duration Continue with 30 min of aerobic exercise without signs/symptoms of physical distress.    Intensity THRR unchanged      Progression   Progression Continue to progress workloads to maintain intensity without signs/symptoms of physical distress.      Resistance Training   Training Prescription Yes    Weight black bands    Reps 10-15    Time 10 Minutes      Interval Training   Interval Training Yes    Equipment Treadmill    Comments 1 min @ 3.2 mph and 1.5% incline, 1 min @ 2.2 mph and 0% incline.      Oxygen   Oxygen Continuous    Liters 1-6      Treadmill   MPH 3.2    Grade 1.5    Minutes 15    METs 3.9      Recumbant Elliptical   Level 2    Minutes  15    METs 3.2      Oxygen   Maintain Oxygen Saturation 88% or higher             Nutrition:  Target Goals: Understanding of nutrition guidelines, daily intake of sodium 1500mg , cholesterol 200mg , calories 30% from fat and 7% or less from saturated fats, daily to have 5 or more servings of fruits and vegetables.  Biometrics:    Nutrition Therapy Plan and Nutrition Goals:  Nutrition Therapy & Goals - 01/18/23 1541       Nutrition Therapy   Diet heart healthy diet    Drug/Food Interactions Statins/Certain Fruits      Personal Nutrition Goals   Nutrition Goal Patient to improve diet quality by using the plate method as a guide for meal planning to include lean protein/plant protein, fruits, vegetables, whole grains, nonfat dairy as part of a well-balanced diet.   goal in progress.   Personal Goal #2 Patient to identify strategies for weight loss of 0.5-2.0# per week.   goal in progress.   Personal Goal #3 Patient to identify food sources and limit daily intake of saturated fat, tran fat, sodium, and refined carbohdyrates.   goal in progress.   Comments Goals in action. We have discussed  multiple strategies for weight loss including increasing low calorie dense foods, protein supplements, the plate method as a guide for meal planning, mindful eating strategies, and tracking intake. She was not successful with weight loss with these methods; recommended starting a Very Low Calorie Diet rich in protein and non-starchy vegetables for 30+ days to aid with creating consistent calorie deficit  Since then, she is down 18.5# from her highest weight with our program (109.8kg).  Hannah Klein is currently working toward lung transplant through [place]. Per documenation with [facility] transplant dietitian, 6/13 and 9/26, she is working toward an initial weight loss goal of 203# and ultimatley a BMI of 27-28. She has maintained her weight over the last ~30 days. She will follow-up with [facility] transplant team 12/26.  She reports that multiple weight loss drugs including wegovy have been denied by her insurance company. Patient will benefit from participation in pulmonary rehab for nutrition, exercise, and lifestyle modification.      Intervention Plan   Intervention Prescribe, educate and counsel regarding individualized specific dietary modifications aiming towards targeted core components such as weight, hypertension, lipid management, diabetes, heart failure and other comorbidities.;Nutrition handout(s) given to patient.    Expected Outcomes Short Term Goal: Understand basic principles of dietary content, such as calories, fat, sodium, cholesterol and nutrients.;Long Term Goal: Adherence to prescribed nutrition plan.;Short Term Goal: A plan has been developed with personal nutrition goals set during dietitian appointment.             Nutrition Assessments:  MEDIFICTS Score Key: >=70 Need to make dietary changes  40-70 Heart Healthy Diet <= 40 Therapeutic Level Cholesterol Diet   Picture Your Plate Scores: <54 Unhealthy dietary pattern with much room for improvement. 41-50 Dietary pattern unlikely to meet recommendations for good health and room for improvement. 51-60 More healthful dietary pattern, with some room for improvement.  >60 Healthy dietary pattern, although there may be some specific behaviors that could be improved.    Nutrition Goals Re-Evaluation:  Nutrition Goals Re-Evaluation     Row Name 09/18/22 1153 10/16/22 1123 11/13/22 1527 12/20/22 0905 01/18/23 1541     Goals   Current Weight 242 lb 4.6 oz (109.9 kg) 235 lb 7.2 oz (106.8 kg) 231 lb 14.8 oz (105.2 kg) 223 lb 8.7 oz (101.4 kg) 223 lb 8.7 oz (101.4 kg)   Comment no new labs; most recent labs A1c WNL, cholesterol 247, triglycerides 25 252 no new labs; most recent labs A1c WNL, cholesterol 247, triglycerides 252 no new labs; most recent labs A1c WNL, cholesterol 247, triglycerides 252 no new pertinent labs; most recent labs  A1c WNL, cholesterol 247, triglycerides 252 no new pertinent labs; most recent labs A1c WNL, cholesterol 247, triglycerides 252   Expected Outcome Goals in progress. We have discussed multiple strategies for weight loss including increasing low calorie dense foods, protein supplements, the plate method as a guide for meal planning, mindful eating strategies, and tracking intake. She has not met weight loss goals and continues to struggle with oversnacking/boredom type snacking. She is up 6.8# since starting with our program. Hannah Klein is currently working toward lung transplant through [place]. Per documenation, 6/13, she is working toward an initial weight loss goal of 203# and ultimatley a BMI of 27-28. She reports that multiple weight loss drugs including wegovy have been denied by her insurance company. She is taking prednisone. Patient will benefit from participation in pulmonary rehab for nutrition, exercise, and lifestyle modification. Goals in progress. We have discussed multiple strategies  for weight loss including increasing low calorie dense foods, protein supplements, the plate method as a guide for meal planning, mindful eating strategies, and tracking intake. She has not met weight loss goals and continues to struggle with oversnacking/boredom type snacking and over eating high fat foods (bacon). She is up 4# since starting with our program though down from her highest weight with our program. Hannah Klein is currently working toward lung transplant through [place]. Per documenation, 6/13, she is working toward an initial weight loss goal of 203# and ultimatley a BMI of 27-28. She reports that multiple weight loss drugs including wegovy have been denied by her insurance company. She is taking prednisone. Recommended starting a Very Low Calorie Diet for 14 days to aid with creating consistent calorie deficit; patient is receptive to this. Patient will benefit from participation in pulmonary rehab for nutrition,  exercise, and lifestyle modification. Goals in action. We have discussed multiple strategies for weight loss including increasing low calorie dense foods, protein supplements, the plate method as a guide for meal planning, mindful eating strategies, and tracking intake. She was not successful with weight loss with these methods; recommended starting a Very Low Calorie Diet for 14-30 days to aid with creating consistent calorie deficit She starting this and is down 10# (105.2kg/231.4#) from her highest weight with our program (109.8kg). Hannah Klein is currently working toward lung transplant through [place]. Per documenation, 6/13, she is working toward an initial weight loss goal of 203# and ultimatley a BMI of 27-28. She reports that multiple weight loss drugs including wegovy have been denied by her insurance company. Patient will benefit from participation in pulmonary rehab for nutrition, exercise, and lifestyle modification. Goals in action. We have discussed multiple strategies for weight loss including increasing low calorie dense foods, protein supplements, the plate method as a guide for meal planning, mindful eating strategies, and tracking intake. She was not successful with weight loss with these methods; recommended starting a Very Low Calorie Diet rich in protein and non-starchy vegetables for 30+ days to aid with creating consistent calorie deficit She is down 18.5# from her highest weight with our program (109.8kg). Hannah Klein is currently working toward lung transplant through [place]. Per documenation with [facility] transplant dietitian, 6/13 and 9/26, she is working toward an initial weight loss goal of 203# and ultimatley a BMI of 27-28. She reports that multiple weight loss drugs including wegovy have been denied by her insurance company. Patient will benefit from participation in pulmonary rehab for nutrition, exercise, and lifestyle modification. Goals in action. We have discussed multiple strategies for weight  loss including increasing low calorie dense foods, protein supplements, the plate method as a guide for meal planning, mindful eating strategies, and tracking intake. She was not successful with weight loss with these methods; recommended starting a Very Low Calorie Diet rich in protein and non-starchy vegetables for 30+ days to aid with creating consistent calorie deficit Since then, she is down 18.5# from her highest weight with our program (109.8kg). Hannah Klein is currently working toward lung transplant through [place]. Per documenation with [facility] transplant dietitian, 6/13 and 9/26, she is working toward an initial weight loss goal of 203# and ultimatley a BMI of 27-28. She has maintained her weight over the last ~30 days. She will follow-up with [facility] transplant team 12/26. She reports that multiple weight loss drugs including wegovy have been denied by her insurance company. Patient will benefit from participation in pulmonary rehab for nutrition, exercise, and lifestyle modification.  Nutrition Goals Discharge (Final Nutrition Goals Re-Evaluation):  Nutrition Goals Re-Evaluation - 01/18/23 1541       Goals   Current Weight 223 lb 8.7 oz (101.4 kg)    Comment no new pertinent labs; most recent labs A1c WNL, cholesterol 247, triglycerides 252    Expected Outcome Goals in action. We have discussed multiple strategies for weight loss including increasing low calorie dense foods, protein supplements, the plate method as a guide for meal planning, mindful eating strategies, and tracking intake. She was not successful with weight loss with these methods; recommended starting a Very Low Calorie Diet rich in protein and non-starchy vegetables for 30+ days to aid with creating consistent calorie deficit Since then, she is down 18.5# from her highest weight with our program (109.8kg). Hannah Klein is currently working toward lung transplant through [place]. Per documenation with [facility] transplant dietitian, 6/13  and 9/26, she is working toward an initial weight loss goal of 203# and ultimatley a BMI of 27-28. She has maintained her weight over the last ~30 days. She will follow-up with [facility] transplant team 12/26. She reports that multiple weight loss drugs including wegovy have been denied by her insurance company. Patient will benefit from participation in pulmonary rehab for nutrition, exercise, and lifestyle modification.             Psychosocial: Target Goals: Acknowledge presence or absence of significant depression and/or stress, maximize coping skills, provide positive support system. Participant is able to verbalize types and ability to use techniques and skills needed for reducing stress and depression.  Initial Review & Psychosocial Screening:   Quality of Life Scores:  Scores of 19 and below usually indicate a poorer quality of life in these areas.  A difference of  2-3 points is a clinically meaningful difference.  A difference of 2-3 points in the total score of the Quality of Life Index has been associated with significant improvement in overall quality of life, self-image, physical symptoms, and general health in studies assessing change in quality of life.  PHQ-9: Review Flowsheet       08/15/2022 08/14/2022 06/09/2021  Depression screen PHQ 2/9  Decreased Interest 1 1 1   Down, Depressed, Hopeless 0 0 0  PHQ - 2 Score 1 1 1   Altered sleeping 0 - 1  Tired, decreased energy 0 - 1  Change in appetite 1 - 0  Feeling bad or failure about yourself  0 - 0  Trouble concentrating 0 - 0  Moving slowly or fidgety/restless 0 - 0  Suicidal thoughts 0 - 0  PHQ-9 Score 2 - 3  Difficult doing work/chores Not difficult at all - Not difficult at all   Interpretation of Total Score  Total Score Depression Severity:  1-4 = Minimal depression, 5-9 = Mild depression, 10-14 = Moderate depression, 15-19 = Moderately severe depression, 20-27 = Severe depression   Psychosocial Evaluation and  Intervention:   Psychosocial Re-Evaluation:  Psychosocial Re-Evaluation     Row Name 09/03/22 0919 09/28/22 0910 10/22/22 1509 11/19/22 0914 12/17/22 1122     Psychosocial Re-Evaluation   Current issues with None Identified None Identified None Identified None Identified None Identified   Comments Hannah Klein denies any new psychosocial barriers or concerns at this time. Hannah Klein continues to deny any psychosocial barriers or concerns at this time. Hannah Klein again denies any needs from the psychosocial eval. She states she has a therapist she sees and is compliant with taking her psychotropic meds. Hannah Klein is still being worked up for  a possible lung transplant. She is optimistic about the evaluation. Hannah Klein again denies any needs from the psychosocial eval. She states she has a therapist she sees and is compliant with taking her psychotropic meds. Hannah Klein is still being worked up for a possible lung transplant. She is currently going to [facility] for procedures. She is optimistic about the evaluation. Hannah Klein again denies any new psychosocial barriers or concerns. She states she has a therapist and is compliant with taking her psychotropic meds. Hannah Klein is still being worked up for a possible lung transplant. She is optimistic about the evaluation.   Expected Outcomes For Hannah Klein to continue participating in PR free of any psychosocial barriers or concerns For Hannah Klein to continue participating in PR free of any psychosocial barriers or concerns For Hannah Klein to continue participating in PR free of any psychosocial barriers or concerns. For Hannah Klein to continue participating in PR free of any psychosocial barriers or concerns. For Hannah Klein to continue participating in PR free of any psychosocial barriers or concerns.   Interventions Encouraged to attend Pulmonary Rehabilitation for the exercise Encouraged to attend Pulmonary Rehabilitation for the exercise Encouraged to attend Pulmonary Rehabilitation for the exercise  Encouraged to attend Pulmonary Rehabilitation for the exercise Encouraged to attend Pulmonary Rehabilitation for the exercise   Continue Psychosocial Services  No Follow up required No Follow up required No Follow up required No Follow up required No Follow up required    Row Name 01/14/23 0951 02/08/23 1424           Psychosocial Re-Evaluation   Current issues with None Identified None Identified      Comments Hannah Klein again denies any new psychosocial barriers or concerns. She states she has finally been approved for disability. This has taken alot of stress off of her. She states she is in a good place as far as her mental health is concerned. She is working with her therapist and is compliant with taking her psychotropic meds. Hannah Klein is still being worked up for a possible lung transplant. She is optimistic about the evaluation. She is also happy about having to use less oxygen when exercising. Hannah Klein continues to deny any psy/soc needs during her monthly psychosocial evaluation. She states that her anxiety is well managed, and she is compliant with her psychotropic medication. She is relieved that she was approved for disability. She is currently being followed by [facility] for possible lung transplant. Since starting the program, she was able to wean down her oxygen. She is happy with the progress she's making as well as losing weight since starting. Hannah Klein has positive coping skills with a positive outlook.      Expected Outcomes For Hannah Klein to continue participating in PR free of any psychosocial barriers or concerns. For Hannah Klein to continue participating in PR free of any psychosocial barriers or concerns.      Interventions Encouraged to attend Pulmonary Rehabilitation for the exercise Encouraged to attend Pulmonary Rehabilitation for the exercise      Continue Psychosocial Services  No Follow up required No Follow up required               Psychosocial Discharge (Final Psychosocial  Re-Evaluation):  Psychosocial Re-Evaluation - 02/08/23 1424       Psychosocial Re-Evaluation   Current issues with None Identified    Comments Hannah Klein continues to deny any psy/soc needs during her monthly psychosocial evaluation. She states that her anxiety is well managed, and she is compliant with her psychotropic medication. She is relieved  that she was approved for disability. She is currently being followed by [facility] for possible lung transplant. Since starting the program, she was able to wean down her oxygen. She is happy with the progress she's making as well as losing weight since starting. Hannah Klein has positive coping skills with a positive outlook.    Expected Outcomes For Hannah Klein to continue participating in PR free of any psychosocial barriers or concerns.    Interventions Encouraged to attend Pulmonary Rehabilitation for the exercise    Continue Psychosocial Services  No Follow up required             Education: Education Goals: Education classes will be provided on a weekly basis, covering required topics. Participant will state understanding/return demonstration of topics presented.  Learning Barriers/Preferences:   Education Topics: Know Your Numbers Group instruction that is supported by a PowerPoint presentation. Instructor discusses importance of knowing and understanding resting, exercise, and post-exercise oxygen saturation, heart rate, and blood pressure. Oxygen saturation, heart rate, blood pressure, rating of perceived exertion, and dyspnea are reviewed along with a normal range for these values.  Flowsheet Row PULMONARY REHAB OTHER RESPIRATORY from 11/29/2022 in [facility] for Heart, Vascular, & Lung Health  Date 11/29/22  Educator EP  Instruction Review Code 1- Verbalizes Understanding       Exercise for the Pulmonary Patient Group instruction that is supported by a PowerPoint presentation. Instructor discusses benefits of exercise,  core components of exercise, frequency, duration, and intensity of an exercise routine, importance of utilizing pulse oximetry during exercise, safety while exercising, and options of places to exercise outside of rehab.  Flowsheet Row PULMONARY REHAB OTHER RESPIRATORY from 08/23/2022 in [facility] for Heart, Vascular, & Lung Health  Date 08/23/22  Educator EP  Instruction Review Code 1- Verbalizes Understanding       MET Level  Group instruction provided by PowerPoint, verbal discussion, and written material to support subject matter. Instructor reviews what METs are and how to increase METs.  Flowsheet Row PULMONARY REHAB OTHER RESPIRATORY from 01/17/2023 in [facility] for Heart, Vascular, & Lung Health  Date 01/17/23  Educator EP  Instruction Review Code 1- Verbalizes Understanding       Pulmonary Medications Verbally interactive group education provided by instructor with focus on inhaled medications and proper administration. Flowsheet Row PULMONARY REHAB OTHER RESPIRATORY from 11/15/2022 in [facility] for Heart, Vascular, & Lung Health  Date 11/15/22  Educator RT  Instruction Review Code 1- Verbalizes Understanding       Anatomy and Physiology of the Respiratory System Group instruction provided by PowerPoint, verbal discussion, and written material to support subject matter. Instructor reviews respiratory cycle and anatomical components of the respiratory system and their functions. Instructor also reviews differences in obstructive and restrictive respiratory diseases with examples of each.  Flowsheet Row PULMONARY REHAB OTHER RESPIRATORY from 02/07/2023 in [facility] for Heart, Vascular, & Lung Health  Date 02/07/23  Educator RT  Instruction Review Code 1- Verbalizes Understanding       Oxygen Safety Group instruction provided by PowerPoint, verbal discussion, and written  material to support subject matter. There is an overview of "What is Oxygen" and "Why do we need it".  Instructor also reviews how to create a safe environment for oxygen use, the importance of using oxygen as prescribed, and the risks of noncompliance. There is a brief discussion on traveling with oxygen and resources the  patient may utilize. Flowsheet Row PULMONARY REHAB OTHER RESPIRATORY from 12/06/2022 in [facility] for Heart, Vascular, & Lung Health  Date 12/06/22  Educator RN  Instruction Review Code 1- Verbalizes Understanding       Oxygen Use Group instruction provided by PowerPoint, verbal discussion, and written material to discuss how supplemental oxygen is prescribed and different types of oxygen supply systems. Resources for more information are provided.  Flowsheet Row PULMONARY REHAB OTHER RESPIRATORY from 12/13/2022 in [facility] for Heart, Vascular, & Lung Health  Date 12/13/22  Educator RT  Instruction Review Code 1- Verbalizes Understanding       Breathing Techniques Group instruction that is supported by demonstration and informational handouts. Instructor discusses the benefits of pursed lip and diaphragmatic breathing and detailed demonstration on how to perform both.  Flowsheet Row PULMONARY REHAB OTHER RESPIRATORY from 12/20/2022 in [facility] for Heart, Vascular, & Lung Health  Date 12/20/22  Educator RN  Instruction Review Code 1- Verbalizes Understanding        Risk Factor Reduction Group instruction that is supported by a PowerPoint presentation. Instructor discusses the definition of a risk factor, different risk factors for pulmonary disease, and how the heart and lungs work together. Flowsheet Row PULMONARY REHAB OTHER RESPIRATORY from 01/10/2023 in [facility] for Heart, Vascular, & Lung Health  Date 01/10/23  Educator EP  Instruction Review Code 1-  Verbalizes Understanding       Pulmonary Diseases Group instruction provided by PowerPoint, verbal discussion, and written material to support subject matter. Instructor gives an overview of the different type of pulmonary diseases. There is also a discussion on risk factors and symptoms as well as ways to manage the diseases. Flowsheet Row PULMONARY REHAB OTHER RESPIRATORY from 01/31/2023 in [facility] for Heart, Vascular, & Lung Health  Date 01/31/23  Educator RT  Instruction Review Code 1- Verbalizes Understanding       Stress and Energy Conservation Group instruction provided by PowerPoint, verbal discussion, and written material to support subject matter. Instructor gives an overview of stress and the impact it can have on the body. Instructor also reviews ways to reduce stress. There is also a discussion on energy conservation and ways to conserve energy throughout the day. Flowsheet Row PULMONARY REHAB OTHER RESPIRATORY from 12/27/2022 in [facility] for Heart, Vascular, & Lung Health  Date 12/27/22  Educator RN  Instruction Review Code 1- Verbalizes Understanding       Warning Signs and Symptoms Group instruction provided by PowerPoint, verbal discussion, and written material to support subject matter. Instructor reviews warning signs and symptoms of stroke, heart attack, cold and flu. Instructor also reviews ways to prevent the spread of infection. Flowsheet Row PULMONARY REHAB OTHER RESPIRATORY from 01/03/2023 in [facility] for Heart, Vascular, & Lung Health  Date 01/03/23  Educator RN  Instruction Review Code 1- Verbalizes Understanding       Other Education Group or individual verbal, written, or video instructions that support the educational goals of the pulmonary rehab program. Flowsheet Row PULMONARY REHAB OTHER RESPIRATORY from 11/01/2022 in [facility] for Heart,  Vascular, & Lung Health  Date 11/01/22  Educator RT  Instruction Review Code 1- Verbalizes Understanding        Knowledge Questionnaire Score:   Core Components/Risk Factors/Patient Goals at Admission:   Core Components/Risk Factors/Patient Goals Review:   Goals  and Risk Factor Review     Row Name 09/03/22 6606 09/28/22 0913 10/22/22 1512 11/19/22 0915 12/17/22 1124     Core Components/Risk Factors/Patient Goals Review   Personal Goals Review Weight Management/Obesity;Improve shortness of breath with ADL's Weight Management/Obesity;Improve shortness of breath with ADL's Weight Management/Obesity;Improve shortness of breath with ADL's Weight Management/Obesity;Improve shortness of breath with ADL's Weight Management/Obesity;Improve shortness of breath with ADL's   Review Hannah Klein has recently started the program. She is working with staff dietician to achieve her weight loss goals. She is able to demonstrate purse lip breathing when she gets short of breath. She still gets short of breath when performing her ADL's. Hannah Klein is enjoying the program so far. We will continue to monitor her progress. Goal progressing for weight loss. Hannah Klein has been working with our dietician for weight loss goals. Goal progressing on improving her shortness of breath with ADLs. Hannah Klein is being worked up for lung transplant and remains optimistic through this process. We will continue to monitor her progress throughout the program. Goal in progress to lose weight and make healthy choices. Hannah Klein is working with our dietician on healthy eating habits and less snacking. Per [facility] lung transplant team recommendations, Hannah Klein will need to lose weight before surgery. Our dietician has recently suggested a low calorie 14-day plan. Hannah Klein was excited to see that she lost weight last week. Hopefully this will jump start her weight loss journey and give her the confidence that she can lose weight. Goal in progress on  improving her shortness of breath with ADLs. Hannah Klein is maintaining her oxygen saturation of 88% or higher on 8-10L with exertion. She is able to titrate down to 2L at rest. Hannah Klein will continue to benefit from PR for nutrition, education, exercise, and lifestyle modification. Goal in progress to lose weight. Hannah Klein is working with our dietician on healthy eating habits and less snacking. Per [facility] lung transplant team recommendations, Hannah Klein will need to lose weight before surgery. She is currently down ~8#. Goal in progress on improving her shortness of breath with ADLs. Hannah Klein is maintaining her oxygen saturation of 88% or higher on 8-10L with exertion. She has increased both his workload and METs while maintain on her current oxygen prescription. Hannah Klein will continue to benefit from PR for nutrition, education, exercise, and lifestyle modification. Goal in progress to lose weight. Hannah Klein is working with our dietician to achieve her weight loss goals. Per [facility] lung transplant team recommendations, Hannah Klein will need to lose weight before surgery. She is currently down 10#. Goal in progress on improving her shortness of breath with ADLs. Hannah Klein is maintaining her oxygen saturation of 88% or higher on 8-10L with exertion. She has increased both his workload and METs while maintain on her current oxygen prescription. Hannah Klein will continue to benefit from PR for nutrition, education, exercise, and lifestyle modification.   Expected Outcomes See admission goals. See admission goals. For [name] to lose weight and improve her shortness of breath with ADLs For [name] to lose weight and improve her shortness of breath with ADLs For [name] to lose weight and improve her shortness of breath with ADLs    Row Name 01/14/23 3016 02/08/23 1424           Core Components/Risk Factors/Patient Goals Review   Personal Goals Review Weight Management/Obesity;Improve shortness of breath with ADL's Weight  Management/Obesity;Improve shortness of breath with ADL's      Review Goal in progress to lose weight. Hannah Klein is working with our dietician to  achieve her weight loss goals. She is currently down 18# from her highest weigh since starting the program. Goal in progress on improving her shortness of breath with ADLs. Hannah Klein is maintaining her oxygen saturation of 88% or higher on 6-8L with exertion. She has increased both his workload and METs while maintaining on her current oxygen prescription. Hannah Klein will continue to benefit from PR for nutrition, education, exercise, and lifestyle modification. Monthly review of patient's Core Components/Risk Factors/Patient Goals are as follows: Goal is progressing for weight loss. Hannah Klein has lost ~11.6# since starting the program. Weight loss is a goal for Hannah Klein because she must meet criteria for a possible lung transplant. She worked with out dietician to formulate a plan that works for her. She is happy with the progress so far. Goal progressing on improving [name]'s shortness of breath with ADLs. Hannah Klein states since starting the program she went from "couch potato" to someone who enjoys exercise. She has completed 20 weeks of exercise with [name] so far. She has been able to wean down her oxygen with exertion, improve her METs, and her workload. Hannah Klein enjoys practicing interval training on the treadmill. We will continue to follow along with [facility] and re-evaluate her goals monthly. Hannah Klein will continue to benefit from participation in PR for nutrition, education, exercise, and lifestyle modification.      Expected Outcomes For [name] to lose weight and improve her shortness of breath with ADLs For [name] to lose weight and improve her shortness of breath with ADLs               Core Components/Risk Factors/Patient Goals at Discharge (Final Review):   Goals and Risk Factor Review - 02/08/23 1424       Core Components/Risk Factors/Patient Goals Review    Personal Goals Review Weight Management/Obesity;Improve shortness of breath with ADL's    Review Monthly review of patient's Core Components/Risk Factors/Patient Goals are as follows: Goal is progressing for weight loss. Hannah Klein has lost ~11.6# since starting the program. Weight loss is a goal for Hannah Klein because she must meet criteria for a possible lung transplant. She worked with out dietician to formulate a plan that works for her. She is happy with the progress so far. Goal progressing on improving [name]'s shortness of breath with ADLs. Hannah Klein states since starting the program she went from "couch potato" to someone who enjoys exercise. She has completed 20 weeks of exercise with [name] so far. She has been able to wean down her oxygen with exertion, improve her METs, and her workload. Hannah Klein enjoys practicing interval training on the treadmill. We will continue to follow along with [facility] and re-evaluate her goals monthly. Hannah Klein will continue to benefit from participation in PR for nutrition, education, exercise, and lifestyle modification.    Expected Outcomes For [name] to lose weight and improve her shortness of breath with ADLs             ITP Comments:Pt is making expected progress toward Pulmonary Rehab goals after completing 43 session(s). Recommend continued exercise, life style modification, education, and utilization of breathing techniques to increase stamina and strength, while also decreasing shortness of breath with exertion.  Dr. Mechele Collin is Medical Director for Pulmonary Rehab at [facility].

## 2023-02-19 NOTE — Progress Notes (Signed)
Daily Session Note  Patient Details  Name: Hannah Klein MRN: [id number] Date of Birth: [birth year] Referring Provider:   Doristine Devoid Pulmonary Rehab Walk Test from 08/15/2022 in [facility] for Heart, Vascular, & Lung Health  Referring Provider Soskis  [sees [name]]       Encounter Date: 02/19/2023  Check In:  Session Check In - 02/19/23 0820       Check-In   Supervising physician immediately available to respond to emergencies [facility] MD immediately available    Physician(s) Edd Fabian, NP    Location [facility]    Staff Present Raford Pitcher, MS, ACSM-CEP, Exercise Physiologist;Casey Synthia Innocent, RN, BSN    Virtual Visit No    Medication changes reported     No    Fall or balance concerns reported    No    Tobacco Cessation No Change    Warm-up and Cool-down Performed as group-led instruction    Resistance Training Performed Yes    VAD Patient? No    PAD/SET Patient? No      Pain Assessment   Currently in Pain? No/denies    Multiple Pain Sites No             Capillary Blood Glucose: No results found for this or any previous visit (from the past 24 hours).   Exercise Prescription Changes - 02/19/23 0900       Response to Exercise   Blood Pressure (Admit) 120/70    Blood Pressure (Exercise) 142/68    Blood Pressure (Exit) 128/70    Heart Rate (Admit) 68 bpm    Heart Rate (Exercise) 100 bpm    Heart Rate (Exit) 79 bpm    Oxygen Saturation (Admit) 100 %    Oxygen Saturation (Exercise) 93 %    Oxygen Saturation (Exit) 95 %    Rating of Perceived Exertion (Exercise) 12    Perceived Dyspnea (Exercise) 1    Duration Continue with 30 min of aerobic exercise without signs/symptoms of physical distress.    Intensity THRR unchanged      Progression   Progression Continue to progress workloads to maintain intensity without signs/symptoms of physical distress.    Average METs 3.3      Resistance Training    Training Prescription Yes    Weight black bands    Reps 10-15    Time 10 Minutes      Interval Training   Interval Training Yes    Equipment Treadmill    Comments 1 min @ 3.5 mph and 1.5% incline, 1 min @ 2.2 mph and 0% incline.      Oxygen   Oxygen Continuous    Liters 1-6      Treadmill   MPH 3.5    Grade 1.5    Minutes 15    METs 4.4      Recumbant Elliptical   Level 6    Watts 92    Minutes 15    METs 3.3      Oxygen   Maintain Oxygen Saturation 88% or higher             Social History   Tobacco Use  Smoking Status Former   Current packs/day: 0.00   Average packs/day: 2.0 packs/day for 18.0 years (36.1 ttl pk-yrs)   Types: Cigarettes   Start date: 46   Quit date: 03/16/2007   Years since quitting: 15.9   Passive exposure: Current  Smokeless Tobacco Never  Goals Met:  Independence with exercise equipment Exercise tolerated well No report of concerns or symptoms today Strength training completed today  Goals Unmet:  Not Applicable  Comments: Service time is from 0814 to 0930.    Dr. Mechele Collin is Medical Director for Pulmonary Rehab at [facility].

## 2023-02-21 ENCOUNTER — Encounter (HOSPITAL_COMMUNITY): Payer: Commercial Managed Care - HMO

## 2023-02-21 ENCOUNTER — Encounter (HOSPITAL_COMMUNITY)
Admission: RE | Admit: 2023-02-21 | Discharge: 2023-02-21 | Disposition: A | Discharge status: Home / Self Care | Payer: Commercial Managed Care - HMO | Source: Ambulatory Visit | Attending: Pulmonary Disease | Admitting: Pulmonary Disease

## 2023-02-21 DIAGNOSIS — J849 Interstitial pulmonary disease, unspecified: Secondary | ICD-10-CM | POA: Diagnosis not present

## 2023-02-21 NOTE — Progress Notes (Signed)
Daily Session Note  Patient Details  Name: Hannah Klein MRN: [id number] Date of Birth: November 30, 1975 Referring Provider:   Doristine Devoid Pulmonary Rehab Walk Test from 08/15/2022 in [facility] for Heart, Vascular, & Lung Health  Referring Provider Soskis  [sees [name]]       Encounter Date: 02/21/2023  Check In:  Session Check In - 02/21/23 0818       Check-In   Supervising physician immediately available to respond to emergencies [facility] immediately available    Physician(s) Reather Littler, NP    [facility]    Staff Present Durel Salts, Patriciaann Clan, RN, BSN;Randi Reeve BS, ACSM-CEP, Exercise Physiologist    Virtual Visit No    Medication changes reported     No    Fall or balance concerns reported    No    Tobacco Cessation No Change    Warm-up and Cool-down Performed as group-led instruction    Resistance Training Performed Yes    VAD Patient? No    PAD/SET Patient? No      Pain Assessment   Currently in Pain? No/denies    Multiple Pain Sites No             Capillary Blood Glucose: No results found for this or any previous visit (from the past 24 hours).    Social History   Tobacco Use  Smoking Status Former   Current packs/day: 0.00   Average packs/day: 2.0 packs/day for 18.0 years (36.1 ttl pk-yrs)   Types: Cigarettes   Start date: 65   Quit date: 03/16/2007   Years since quitting: 15.9   Passive exposure: Current  Smokeless Tobacco Never    Goals Met:  Independence with exercise equipment Exercise tolerated well No report of concerns or symptoms today Strength training completed today  Goals Unmet:  Not Applicable  Comments: Service time is from 0816 to 0926    Dr. Mechele Collin is Medical Director for Pulmonary Rehab at [facility].

## 2023-02-25 DIAGNOSIS — R0602 Shortness of breath: Secondary | ICD-10-CM | POA: Diagnosis not present

## 2023-02-25 DIAGNOSIS — Z7682 Awaiting organ transplant status: Secondary | ICD-10-CM | POA: Diagnosis not present

## 2023-02-25 DIAGNOSIS — R918 Other nonspecific abnormal finding of lung field: Secondary | ICD-10-CM | POA: Diagnosis not present

## 2023-02-25 DIAGNOSIS — Z981 Arthrodesis status: Secondary | ICD-10-CM | POA: Diagnosis not present

## 2023-02-25 DIAGNOSIS — G4733 Obstructive sleep apnea (adult) (pediatric): Secondary | ICD-10-CM | POA: Diagnosis not present

## 2023-02-25 DIAGNOSIS — R931 Abnormal findings on diagnostic imaging of heart and coronary circulation: Secondary | ICD-10-CM | POA: Diagnosis not present

## 2023-02-25 DIAGNOSIS — J849 Interstitial pulmonary disease, unspecified: Secondary | ICD-10-CM | POA: Diagnosis not present

## 2023-02-26 ENCOUNTER — Encounter (HOSPITAL_COMMUNITY)
Admission: RE | Admit: 2023-02-26 | Discharge: 2023-02-26 | Disposition: A | Discharge status: Home / Self Care | Payer: Commercial Managed Care - HMO | Source: Ambulatory Visit | Attending: Pulmonary Disease | Admitting: Pulmonary Disease

## 2023-02-26 DIAGNOSIS — J849 Interstitial pulmonary disease, unspecified: Secondary | ICD-10-CM | POA: Diagnosis not present

## 2023-02-26 NOTE — Progress Notes (Signed)
 Daily Session Note  Patient Details  Name: DIVA LEMBERGER MRN: [id number] Date of Birth: 10-13-1975 Referring Provider:   [name] Pulmonary Rehab Walk Test from 08/15/2022 in [facility] for Heart, Vascular, & Lung Health  Referring Provider Soskis  [sees [name]]       Encounter Date: 02/26/2023  Check In:  Session Check In - 02/26/23 0834       Check-In   Supervising physician immediately available to respond to emergencies [facility] immediately available    Physician(s) Orren Fabry, NP    [facility]    Staff Present Augustin Sharps, Candia Levin, RN, BSN;Randi Reeve BS, ACSM-CEP, Exercise Physiologist    Virtual Visit No    Medication changes reported     No    Fall or balance concerns reported    No    Tobacco Cessation No Change    Warm-up and Cool-down Performed as group-led instruction    Resistance Training Performed Yes    VAD Patient? No    PAD/SET Patient? No      Pain Assessment   Currently in Pain? No/denies    Multiple Pain Sites No             Capillary Blood Glucose: No results found for this or any previous visit (from the past 24 hours).    Social History   Tobacco Use  Smoking Status Former   Current packs/day: 0.00   Average packs/day: 2.0 packs/day for 18.0 years (36.1 ttl pk-yrs)   Types: Cigarettes   Start date: 14   Quit date: 03/16/2007   Years since quitting: 15.9   Passive exposure: Current  Smokeless Tobacco Never    Goals Met:  Proper associated with RPD/PD & O2 Sat Independence with exercise equipment Exercise tolerated well No report of concerns or symptoms today Strength training completed today  Goals Unmet:  Not Applicable  Comments: Service time is from 0809 to [phone].    Dr. Slater Staff is Medical Director for Pulmonary Rehab at [facility].

## 2023-02-28 ENCOUNTER — Other Ambulatory Visit: Payer: Commercial Managed Care - HMO

## 2023-02-28 ENCOUNTER — Encounter (HOSPITAL_COMMUNITY)
Admission: RE | Admit: 2023-02-28 | Discharge: 2023-02-28 | Disposition: A | Discharge status: Home / Self Care | Payer: Medicaid Other | Source: Ambulatory Visit | Attending: Pulmonary Disease | Admitting: Pulmonary Disease

## 2023-02-28 DIAGNOSIS — J849 Interstitial pulmonary disease, unspecified: Secondary | ICD-10-CM | POA: Insufficient documentation

## 2023-02-28 NOTE — Progress Notes (Signed)
 Daily Session Note  Patient Details  Name: Hannah Klein MRN: [id number] Date of Birth: April 15, 1975 Referring Provider:   [name] Pulmonary Rehab Walk Test from 08/15/2022 in [facility] for Heart, Vascular, & Lung Health  Referring Provider Soskis  [sees [name]]       Encounter Date: 02/28/2023  Check In:  Session Check In - 02/28/23 0831       Check-In   Supervising physician immediately available to respond to emergencies [facility] immediately available    Physician(s) Rosaline Skains, NP    [facility]    Staff Present Augustin Sharps, Candia Levin, RN, BSN;Randi Reeve BS, ACSM-CEP, Exercise Physiologist    Virtual Visit No    Medication changes reported     No    Fall or balance concerns reported    No    Tobacco Cessation No Change    Warm-up and Cool-down Performed as group-led instruction    Resistance Training Performed Yes    VAD Patient? No    PAD/SET Patient? No      Pain Assessment   Currently in Pain? No/denies    Multiple Pain Sites No             Capillary Blood Glucose: No results found for this or any previous visit (from the past 24 hours).    Social History   Tobacco Use  Smoking Status Former   Current packs/day: 0.00   Average packs/day: 2.0 packs/day for 18.0 years (36.1 ttl pk-yrs)   Types: Cigarettes   Start date: 48   Quit date: 03/16/2007   Years since quitting: 15.9   Passive exposure: Current  Smokeless Tobacco Never    Goals Met:  Proper associated with RPD/PD & O2 Sat Independence with exercise equipment Exercise tolerated well No report of concerns or symptoms today Strength training completed today  Goals Unmet:  Not Applicable  Comments: Service time is from 0802 to 0926.    Dr. Slater Staff is Medical Director for Pulmonary Rehab at [facility].

## 2023-03-01 ENCOUNTER — Other Ambulatory Visit: Payer: Managed Care, Other (non HMO)

## 2023-03-04 DIAGNOSIS — G4733 Obstructive sleep apnea (adult) (pediatric): Secondary | ICD-10-CM

## 2023-03-04 DIAGNOSIS — I10 Essential (primary) hypertension: Secondary | ICD-10-CM

## 2023-03-04 DIAGNOSIS — E6609 Other obesity due to excess calories: Secondary | ICD-10-CM

## 2023-03-04 DIAGNOSIS — E785 Hyperlipidemia, unspecified: Secondary | ICD-10-CM

## 2023-03-04 DIAGNOSIS — J849 Interstitial pulmonary disease, unspecified: Secondary | ICD-10-CM

## 2023-03-05 ENCOUNTER — Encounter (HOSPITAL_COMMUNITY)
Admission: RE | Admit: 2023-03-05 | Discharge: 2023-03-05 | Disposition: A | Discharge status: Home / Self Care | Payer: Medicaid Other | Source: Ambulatory Visit | Attending: Pulmonary Disease | Admitting: Pulmonary Disease

## 2023-03-05 VITALS — Wt 228.4 lb

## 2023-03-05 DIAGNOSIS — J849 Interstitial pulmonary disease, unspecified: Secondary | ICD-10-CM | POA: Diagnosis not present

## 2023-03-05 NOTE — Progress Notes (Signed)
 Daily Session Note  Patient Details  Name: HILDEGARDE DUNAWAY MRN: [id number] Date of Birth: 04/03/1975 Referring Provider:   [name] Pulmonary Rehab Walk Test from 08/15/2022 in [facility] for Heart, Vascular, & Lung Health  Referring Provider Soskis  [sees [name]]       Encounter Date: 03/05/2023  Check In:  Session Check In - 03/05/23 9177       Check-In   Supervising physician immediately available to respond to emergencies [facility] immediately available    Physician(s) Rosaline Skains, NP    [facility]    Staff Present Augustin Sharps, RT;Trig Mcbryar Midge BS, ACSM-CEP, Exercise Physiologist;Kaylee Nicholaus, MS, ACSM-CEP, Exercise Physiologist    Virtual Visit No    Medication changes reported     No    Fall or balance concerns reported    No    Tobacco Cessation No Change    Warm-up and Cool-down Performed as group-led instruction    Resistance Training Performed Yes    VAD Patient? No    PAD/SET Patient? No      Pain Assessment   Currently in Pain? No/denies    Multiple Pain Sites No             Capillary Blood Glucose: No results found for this or any previous visit (from the past 24 hours).   Exercise Prescription Changes - 03/05/23 0900       Response to Exercise   Blood Pressure (Admit) 120/60    Blood Pressure (Exercise) 170/66    Blood Pressure (Exit) 110/62    Heart Rate (Admit) 82 bpm    Heart Rate (Exercise) 106 bpm    Heart Rate (Exit) 88 bpm    Oxygen  Saturation (Admit) 97 %    Oxygen  Saturation (Exercise) 90 %    Oxygen  Saturation (Exit) 93 %    Rating of Perceived Exertion (Exercise) 13    Perceived Dyspnea (Exercise) 2    Duration Continue with 30 min of aerobic exercise without signs/symptoms of physical distress.    Intensity THRR unchanged      Progression   Progression Continue to progress workloads to maintain intensity without signs/symptoms of physical distress.    Average METs 3.3       Resistance Training   Training Prescription Yes    Weight black bands    Reps 10-15    Time 10 Minutes      Interval Training   Interval Training Yes    Equipment Treadmill    Comments 1 min @ 3.4 mph and 1.5% incline, 1 min @ 2.2 mph and 0% incline.      Oxygen    Oxygen  Continuous    Liters 1-6      Treadmill   MPH 3.4    Grade 1.5    Minutes 15    METs 4.2      Recumbant Elliptical   Level 6    Minutes 15    METs 3.3             Social History   Tobacco Use  Smoking Status Former   Current packs/day: 0.00   Average packs/day: 2.0 packs/day for 18.0 years (36.1 ttl pk-yrs)   Types: Cigarettes   Start date: 74   Quit date: 03/16/2007   Years since quitting: 15.9   Passive exposure: Current  Smokeless Tobacco Never    Goals Met:  Independence with exercise equipment Exercise tolerated well No report of concerns or symptoms  today Strength training completed today  Goals Unmet:  Not Applicable  Comments: Service time is from 0810 to [phone].    Dr. Slater Staff is Medical Director for Pulmonary Rehab at [facility].

## 2023-03-06 DIAGNOSIS — Z7682 Awaiting organ transplant status: Secondary | ICD-10-CM | POA: Diagnosis not present

## 2023-03-06 DIAGNOSIS — F54 Psychological and behavioral factors associated with disorders or diseases classified elsewhere: Secondary | ICD-10-CM | POA: Diagnosis not present

## 2023-03-07 ENCOUNTER — Encounter (HOSPITAL_COMMUNITY)
Admission: RE | Admit: 2023-03-07 | Discharge: 2023-03-07 | Disposition: A | Discharge status: Home / Self Care | Payer: Medicaid Other | Source: Ambulatory Visit | Attending: Pulmonary Disease | Admitting: Pulmonary Disease

## 2023-03-07 DIAGNOSIS — J849 Interstitial pulmonary disease, unspecified: Secondary | ICD-10-CM

## 2023-03-07 NOTE — Progress Notes (Signed)
 Daily Session Note  Patient Details  Name: Hannah Klein MRN: [id number] Date of Birth: December 10, 1975 Referring Provider:   [name] Pulmonary Rehab Walk Test from 08/15/2022 in [facility] for Heart, Vascular, & Lung Health  Referring Provider Soskis  [sees [name]]       Encounter Date: 03/07/2023  Check In:  Session Check In - 03/07/23 9166       Check-In   Supervising physician immediately available to respond to emergencies [facility] immediately available    Physician(s) Sloan press, NP    [facility]    Staff Present Augustin Sharps, RT;Randi Midge BS, ACSM-CEP, Exercise Physiologist;Kaylee Nicholaus, MS, ACSM-CEP, Exercise Physiologist;Mary Harvy, RN, BSN    Virtual Visit No    Medication changes reported     No    Fall or balance concerns reported    No    Tobacco Cessation No Change    Warm-up and Cool-down Performed as group-led instruction    Resistance Training Performed Yes    VAD Patient? No    PAD/SET Patient? No      Pain Assessment   Currently in Pain? No/denies    Multiple Pain Sites No             Capillary Blood Glucose: No results found for this or any previous visit (from the past 24 hours).    Social History   Tobacco Use  Smoking Status Former   Current packs/day: 0.00   Average packs/day: 2.0 packs/day for 18.0 years (36.1 ttl pk-yrs)   Types: Cigarettes   Start date: 61   Quit date: 03/16/2007   Years since quitting: 15.9   Passive exposure: Current  Smokeless Tobacco Never    Goals Met:  Proper associated with RPD/PD & O2 Sat Independence with exercise equipment Exercise tolerated well No report of concerns or symptoms today Strength training completed today  Goals Unmet:  Not Applicable  Comments: Service time is from 0820 to 0935.    Dr. Slater Staff is Medical Director for Pulmonary Rehab at [facility].

## 2023-03-11 DIAGNOSIS — M3502 Sicca syndrome with lung involvement: Secondary | ICD-10-CM | POA: Diagnosis not present

## 2023-03-11 DIAGNOSIS — J849 Interstitial pulmonary disease, unspecified: Secondary | ICD-10-CM | POA: Diagnosis not present

## 2023-03-11 DIAGNOSIS — Z79899 Other long term (current) drug therapy: Secondary | ICD-10-CM | POA: Diagnosis not present

## 2023-03-11 DIAGNOSIS — G6289 Other specified polyneuropathies: Secondary | ICD-10-CM | POA: Diagnosis not present

## 2023-03-11 DIAGNOSIS — R7401 Elevation of levels of liver transaminase levels: Secondary | ICD-10-CM | POA: Diagnosis not present

## 2023-03-11 DIAGNOSIS — D8989 Other specified disorders involving the immune mechanism, not elsewhere classified: Secondary | ICD-10-CM | POA: Diagnosis not present

## 2023-03-11 DIAGNOSIS — I7300 Raynaud's syndrome without gangrene: Secondary | ICD-10-CM | POA: Diagnosis not present

## 2023-03-12 ENCOUNTER — Encounter (HOSPITAL_COMMUNITY)
Admission: RE | Admit: 2023-03-12 | Discharge: 2023-03-12 | Disposition: A | Discharge status: Home / Self Care | Payer: Medicaid Other | Source: Ambulatory Visit | Attending: Pulmonary Disease

## 2023-03-12 ENCOUNTER — Other Ambulatory Visit: Payer: Self-pay | Admitting: Medical Genetics

## 2023-03-12 DIAGNOSIS — J849 Interstitial pulmonary disease, unspecified: Secondary | ICD-10-CM

## 2023-03-12 NOTE — Progress Notes (Signed)
 Daily Session Note  Patient Details  Name: Hannah Klein MRN: [id number] Date of Birth: 07-02-1975 Referring Provider:   [name] Pulmonary Rehab Walk Test from 08/15/2022 in [facility] for Heart, Vascular, & Lung Health  Referring Provider Soskis  [sees [name]]       Encounter Date: 03/12/2023  Check In:  Session Check In - 03/12/23 0819       Check-In   Supervising physician immediately available to respond to emergencies [facility] immediately available    Physician(s) Lamarr Satterfield, NP    [facility]    Staff Present Augustin Sharps, RT;Randi Midge BS, ACSM-CEP, Exercise Physiologist;Kaylee Nicholaus, MS, ACSM-CEP, Exercise Physiologist    Virtual Visit No    Medication changes reported     No    Fall or balance concerns reported    No    Tobacco Cessation No Change    Warm-up and Cool-down Performed as group-led instruction    Resistance Training Performed Yes    VAD Patient? No    PAD/SET Patient? No      Pain Assessment   Currently in Pain? No/denies    Multiple Pain Sites No             Capillary Blood Glucose: No results found for this or any previous visit (from the past 24 hours).    Social History   Tobacco Use  Smoking Status Former   Current packs/day: 0.00   Average packs/day: 2.0 packs/day for 18.0 years (36.1 ttl pk-yrs)   Types: Cigarettes   Start date: 44   Quit date: 03/16/2007   Years since quitting: 16.0   Passive exposure: Current  Smokeless Tobacco Never    Goals Met:  Proper associated with RPD/PD & O2 Sat Independence with exercise equipment Exercise tolerated well No report of concerns or symptoms today Strength training completed today  Goals Unmet:  Not Applicable  Comments: Service time is from 0810 to [phone].    Dr. Slater Staff is Medical Director for Pulmonary Rehab at [facility].

## 2023-03-13 DIAGNOSIS — F54 Psychological and behavioral factors associated with disorders or diseases classified elsewhere: Secondary | ICD-10-CM | POA: Diagnosis not present

## 2023-03-13 DIAGNOSIS — Z7682 Awaiting organ transplant status: Secondary | ICD-10-CM | POA: Diagnosis not present

## 2023-03-14 ENCOUNTER — Encounter (HOSPITAL_COMMUNITY)
Admission: RE | Admit: 2023-03-14 | Discharge: 2023-03-14 | Disposition: A | Discharge status: Home / Self Care | Payer: Medicaid Other | Source: Ambulatory Visit | Attending: Pulmonary Disease | Admitting: Pulmonary Disease

## 2023-03-14 VITALS — Wt 227.5 lb

## 2023-03-14 DIAGNOSIS — J849 Interstitial pulmonary disease, unspecified: Secondary | ICD-10-CM | POA: Diagnosis not present

## 2023-03-14 NOTE — Progress Notes (Signed)
Daily Session Note  Patient Details  Name: Hannah Klein MRN: [id number] Date of Birth: 10/28/1975 Referring Provider:   Doristine Devoid Pulmonary Rehab Walk Test from 08/15/2022 in [facility] for Heart, Vascular, & Lung Health  Referring Provider Soskis  [sees [name]]       Encounter Date: 03/14/2023  Check In:  Session Check In - 03/14/23 1884       Check-In   Supervising physician immediately available to respond to emergencies [facility] immediately available    Physician(s) Bernadene Person, NP    [facility]    Staff Present Durel Salts, RT;Randi Idelle Crouch BS, ACSM-CEP, Exercise Physiologist;Kaylee Earlene Plater, MS, ACSM-CEP, Exercise Physiologist;Tashiba Timoney Gerre Scull, RN, BSN    Virtual Visit No    Medication changes reported     No    Fall or balance concerns reported    No    Tobacco Cessation No Change    Warm-up and Cool-down Performed as group-led instruction    Resistance Training Performed Yes    VAD Patient? No    PAD/SET Patient? No      Pain Assessment   Currently in Pain? No/denies             Capillary Blood Glucose: No results found for this or any previous visit (from the past 24 hours).    Social History   Tobacco Use  Smoking Status Former   Current packs/day: 0.00   Average packs/day: 2.0 packs/day for 18.0 years (36.1 ttl pk-yrs)   Types: Cigarettes   Start date: 9   Quit date: 03/16/2007   Years since quitting: 16.0   Passive exposure: Current  Smokeless Tobacco Never    Goals Met:  Independence with exercise equipment Exercise tolerated well No report of concerns or symptoms today Strength training completed today  Goals Unmet:  Not Applicable  Comments: Service time is from 0808 to 0816    Dr. Mechele Collin is Medical Director for Pulmonary Rehab at [facility].

## 2023-03-15 ENCOUNTER — Telehealth: Payer: Self-pay

## 2023-03-15 NOTE — Telephone Encounter (Signed)
PA request has been Submitted. New Encounter created for follow up. For additional info see Pharmacy Prior Auth telephone encounter from 03/15/2023.

## 2023-03-15 NOTE — Telephone Encounter (Signed)
Pharmacy Patient Advocate Encounter   Received notification from Patient Advice Request messages that prior authorization for Zepbound is required/requested.   Insurance verification completed.   The patient is insured through [facility] .   Per test claim: PA required; PA submitted to above mentioned insurance via CoverMyMeds Key/confirmation #/EOC [id number] Status is pending

## 2023-03-19 ENCOUNTER — Encounter (HOSPITAL_COMMUNITY): Payer: Medicaid Other

## 2023-03-19 DIAGNOSIS — D8989 Other specified disorders involving the immune mechanism, not elsewhere classified: Secondary | ICD-10-CM | POA: Diagnosis not present

## 2023-03-19 DIAGNOSIS — K219 Gastro-esophageal reflux disease without esophagitis: Secondary | ICD-10-CM | POA: Diagnosis not present

## 2023-03-19 DIAGNOSIS — D84821 Immunodeficiency due to drugs: Secondary | ICD-10-CM | POA: Diagnosis not present

## 2023-03-19 DIAGNOSIS — J8489 Other specified interstitial pulmonary diseases: Secondary | ICD-10-CM | POA: Diagnosis not present

## 2023-03-19 DIAGNOSIS — Z79624 Long term (current) use of inhibitors of nucleotide synthesis: Secondary | ICD-10-CM | POA: Diagnosis not present

## 2023-03-19 DIAGNOSIS — J849 Interstitial pulmonary disease, unspecified: Secondary | ICD-10-CM | POA: Diagnosis not present

## 2023-03-19 DIAGNOSIS — Z79899 Other long term (current) drug therapy: Secondary | ICD-10-CM | POA: Diagnosis not present

## 2023-03-19 DIAGNOSIS — M3502 Sicca syndrome with lung involvement: Secondary | ICD-10-CM | POA: Diagnosis not present

## 2023-03-19 DIAGNOSIS — Z87891 Personal history of nicotine dependence: Secondary | ICD-10-CM | POA: Diagnosis not present

## 2023-03-19 DIAGNOSIS — J9611 Chronic respiratory failure with hypoxia: Secondary | ICD-10-CM | POA: Diagnosis not present

## 2023-03-19 DIAGNOSIS — R0602 Shortness of breath: Secondary | ICD-10-CM | POA: Diagnosis not present

## 2023-03-20 DIAGNOSIS — F54 Psychological and behavioral factors associated with disorders or diseases classified elsewhere: Secondary | ICD-10-CM | POA: Diagnosis not present

## 2023-03-20 DIAGNOSIS — Z7682 Awaiting organ transplant status: Secondary | ICD-10-CM | POA: Diagnosis not present

## 2023-03-20 NOTE — Telephone Encounter (Signed)
Copied from CRM [id number]. Topic: Clinical - Prescription Issue >> Mar 20, 2023  2:47 PM Adaysia C wrote: Reason for CRM: Patients insurance is saying they denied the prior authorization for Zepbound and informed the provider it was denied because they needed more information. The fax from the patients insurance requesting more information was not found in the patients chart. Please follow up with patients insurance regarding this prior authorization. Patient has requested a follow up call #[phone](Patient will not be available to answer the call between 3:30p-4:30p on 03/20/2023)

## 2023-03-20 NOTE — Progress Notes (Signed)
Pulmonary Individual Treatment Plan  Patient Details  Name: Hannah Klein MRN: [id number] Date of Birth: 23-Feb-1976 Referring Provider:   Doristine Devoid Pulmonary Rehab Walk Test from 08/15/2022 in [facility]  Referring Provider Soskis  [sees [name]]       Initial Encounter Date:  Flowsheet Row Pulmonary Rehab Walk Test from 08/15/2022 in [facility]  Date 08/15/22       Visit Diagnosis: Interstitial lung disease (HCC)  Patient's Home Medications on Admission:   Current Outpatient Medications:    acetaminophen (TYLENOL) 500 MG tablet, Take 2 tablets (1,000 mg total) by mouth every 6 (six) hours as needed., Disp: 30 tablet, Rfl: 0   albuterol (VENTOLIN HFA) 108 (90 Base) MCG/ACT inhaler, INHALE 1-2 PUFFS BY MOUTH EVERY 6 HOURS AS NEEDED FOR WHEEZE OR SHORTNESS OF BREATH, Disp: 18 g, Rfl: 0   Azelastine HCl 137 MCG/SPRAY SOLN, PLACE 2 SPRAYS INTO BOTH NOSTRILS 2 (TWO) TIMES DAILY. USE IN EACH NOSTRIL AS DIRECTED, Disp: 30 mL, Rfl: 5   B Complex-C (B-COMPLEX WITH VITAMIN C) tablet, Take 1 tablet by mouth daily., Disp: , Rfl:    budesonide (PULMICORT) 0.25 MG/2ML nebulizer solution, Take 0.25 mg by nebulization 2 (two) times daily., Disp: , Rfl:    Cholecalciferol (VITAMIN D3) 125 MCG (5000 UT) TABS, Take 5,000 Units by mouth daily., Disp: , Rfl:    cycloSPORINE (RESTASIS) 0.05 % ophthalmic emulsion, Place 1 drop into both eyes 2 (two) times daily., Disp: , Rfl:    desonide (DESOWEN) 0.05 % cream, Apply 1 Application topically daily as needed., Disp: , Rfl:    diclofenac Sodium (VOLTAREN ARTHRITIS PAIN) 1 % GEL, Apply 2 g topically daily as needed (Chest pain)., Disp: , Rfl:    econazole nitrate 1 % cream, Apply 1 Application topically daily as needed., Disp: , Rfl:    esomeprazole (NEXIUM) 40 MG capsule, TAKE 1 CAPSULE (40 MG TOTAL) BY MOUTH 2 (TWO) TIMES DAILY. FOR  HEARTBURN., Disp: 180 capsule, Rfl: 2   famotidine (PEPCID) 40 MG tablet, Take 1 tablet (40 mg total) by mouth 2 (two) times daily. for heartburn., Disp: 180 tablet, Rfl: 2   fexofenadine (ALLEGRA) 180 MG tablet, Take 180 mg by mouth daily., Disp: , Rfl:    formoterol (PERFOROMIST) 20 MCG/2ML nebulizer solution, Take 20 mcg by nebulization 2 (two) times daily. (Patient not taking: Reported on 02/06/2023), Disp: , Rfl:    hydrOXYzine (VISTARIL) 50 MG capsule, TAKE 1 CAPSULE BY MOUTH ONCE OR TWICE DAILY AS NEEDED FOR ANXIETY, Disp: 180 capsule, Rfl: 2   levocetirizine (XYZAL) 5 MG tablet, Take 1 tablet (5 mg total) by mouth 2 (two) times daily. For allergies, Disp: 180 tablet, Rfl: 3   mometasone-formoterol (DULERA) 200-5 MCG/ACT AERO, Inhale 2 puffs into the lungs every 12 (twelve) hours. (Patient not taking: Reported on 02/06/2023), Disp: 13 g, Rfl: 5   Multiple Vitamin (MULTIVITAMIN) capsule, Take 1 capsule by mouth daily., Disp: , Rfl:    neomycin-polymyxin b-dexamethasone (MAXITROL) 3.5-10000-0.1 OINT, Place 1 Application into both eyes at bedtime., Disp: , Rfl:    Omega-3 Fatty Acids (OMEGA-3 FISH OIL) 300 MG CAPS, Take 1,000 mg by mouth., Disp: , Rfl:    ondansetron (ZOFRAN) 4 MG tablet, Take 1 tablet (4 mg total) by mouth every 8 (eight) hours as needed for nausea or vomiting., Disp: 20 tablet, Rfl: 0   oxyCODONE-acetaminophen (PERCOCET) 7.5-325 MG tablet,  Take 1 tablet by mouth every 6 (six) hours as needed for up to 20 doses for severe pain., Disp: 20 tablet, Rfl: 0   predniSONE (DELTASONE) 10 MG tablet, TAKE 2 TABLETS BY MOUTH TWICE DAILY, Disp: 120 tablet, Rfl: 3   predniSONE (DELTASONE) 5 MG tablet, Take 1 tablet (5 mg total) by mouth daily with breakfast. (Patient not taking: Reported on 02/06/2023), Disp: 90 tablet, Rfl: 1   pregabalin (LYRICA) 25 MG capsule, Take 25 mg by mouth 2 (two) times daily., Disp: , Rfl:    rosuvastatin (CRESTOR) 10 MG tablet, TAKE 1 TABLET BY MOUTH EVERY DAY FOR  CHOLESTEROL, Disp: 90 tablet, Rfl: 2   sucralfate (CARAFATE) 1 g tablet, Take 1 tablet (1 g total) by mouth in the morning, at noon, and at bedtime., Disp: 90 tablet, Rfl: 5   sulfamethoxazole-trimethoprim (BACTRIM) 400-80 MG tablet, Take 1 tablet by mouth daily., Disp: , Rfl:    vitamin B-12 (CYANOCOBALAMIN) 1000 MCG tablet, Take 1,000 mcg by mouth daily., Disp: , Rfl:    vitamin C (ASCORBIC ACID) 500 MG tablet, Take 500 mg by mouth daily., Disp: , Rfl:    vitamin E 180 MG (400 UNITS) capsule, Take 400 Units by mouth daily., Disp: , Rfl:   Past Medical History: Past Medical History:  Diagnosis Date   Arthritis    Asthma    Environmental allergies    Esophageal dysphagia    Generalized anxiety disorder    GERD (gastroesophageal reflux disease)    History of hiatal hernia    Hx of colonic polyps    Hypertension    Lung disorder    Patient states she has two unidentified ILD   Migraines    MRSA (methicillin resistant Staphylococcus aureus)    Lt upper arm  approx 8 years ago   Sleep apnea     Tobacco Use: Social History   Tobacco Use  Smoking Status Former   Current packs/day: 0.00   Average packs/day: 2.0 packs/day for 18.0 years (36.1 ttl pk-yrs)   Types: Cigarettes   Start date: 73   Quit date: 03/16/2007   Years since quitting: 16.0   Passive exposure: Current  Smokeless Tobacco Never    Labs: Review Flowsheet  More data exists      Latest Ref Rng & Units 04/07/2019 07/07/2019 03/17/2020 06/09/2021 05/31/2022  Labs for ITP Cardiac and Pulmonary Rehab  Cholestrol 0 - 200 mg/dL 952  841  324  401  027   LDL (calc) 0 - 99 mg/dL 78  253  94  - -  Direct LDL mg/dL - - - 664.4  034.7   HDL-C >39.00 mg/dL 42.59  56.38  75.64  33.29  66.80   Trlycerides 0.0 - 149.0 mg/dL 518.8  416.6  063.0  160.1  252.0   Hemoglobin A1c 4.6 - 6.5 % - - 5.5  5.6  5.5     Capillary Blood Glucose: No results found for: "GLUCAP"   Pulmonary Assessment Scores:  UCSD: Self-administered  rating of dyspnea associated with activities of daily living (ADLs) 6-point scale (0 = "not at all" to 5 = "maximal or unable to do because of breathlessness")  Scoring Scores range from 0 to 120.  Minimally important difference is 5 units  CAT: CAT can identify the health impairment of COPD patients and is better correlated with disease progression.  CAT has a scoring range of zero to 40. The CAT score is classified into four groups of low (less than 10),  medium (10 - 20), high (21-30) and very high (31-40) based on the impact level of disease on health status. A CAT score over 10 suggests significant symptoms.  A worsening CAT score could be explained by an exacerbation, poor medication adherence, poor inhaler technique, or progression of COPD or comorbid conditions.  CAT MCID is 2 points  mMRC: mMRC (Modified Medical Research Council) Dyspnea Scale is used to assess the degree of baseline functional disability in patients of respiratory disease due to dyspnea. No minimal important difference is established. A decrease in score of 1 point or greater is considered a positive change.   Pulmonary Function Assessment:   Exercise Target Goals: Exercise Program Goal: Individual exercise prescription set using results from initial 6 min walk test and THRR while considering  patient's activity barriers and safety.   Exercise Prescription Goal: Initial exercise prescription builds to 30-45 minutes a day of aerobic activity, 2-3 days per week.  Home exercise guidelines will be given to patient during program as part of exercise prescription that the participant will acknowledge.  Activity Barriers & Risk Stratification:   6 Minute Walk:   Oxygen Initial Assessment:   Oxygen Re-Evaluation:  Oxygen Re-Evaluation     Row Name 09/21/22 1351 10/25/22 1620 11/20/22 1631 12/19/22 1032 01/16/23 1040     Program Oxygen Prescription   Program Oxygen Prescription Continuous Continuous Continuous  Continuous Continuous   Liters per minute 10 10 10 10 6    Comments 94% on 10L 94% on 10L -- -- 95% on 8L, 88-90% on 6L     Home Oxygen   Home Oxygen Device E-Tanks;Home Concentrator E-Tanks;Home Concentrator E-Tanks;Home Concentrator E-Tanks;Home Concentrator E-Tanks;Home Concentrator   Sleep Oxygen Prescription Continuous Continuous Continuous Continuous Continuous   Liters per minute 4 4 4 4 4    Home Exercise Oxygen Prescription Continuous Continuous Continuous Continuous Continuous   Liters per minute 8 8 8 8 8    Home Resting Oxygen Prescription Continuous Continuous Continuous Continuous Continuous   Liters per minute 2 2 2 2 2    Compliance with Home Oxygen Use Yes Yes Yes Yes Yes     Goals/Expected Outcomes   Short Term Goals To learn and exhibit compliance with exercise, home and travel O2 prescription;To learn and understand importance of maintaining oxygen saturations>88%;To learn and demonstrate proper use of respiratory medications;To learn and understand importance of monitoring SPO2 with pulse oximeter and demonstrate accurate use of the pulse oximeter.;To learn and demonstrate proper pursed lip breathing techniques or other breathing techniques.  To learn and exhibit compliance with exercise, home and travel O2 prescription;To learn and understand importance of maintaining oxygen saturations>88%;To learn and demonstrate proper use of respiratory medications;To learn and understand importance of monitoring SPO2 with pulse oximeter and demonstrate accurate use of the pulse oximeter.;To learn and demonstrate proper pursed lip breathing techniques or other breathing techniques.  To learn and exhibit compliance with exercise, home and travel O2 prescription;To learn and understand importance of maintaining oxygen saturations>88%;To learn and demonstrate proper use of respiratory medications;To learn and understand importance of monitoring SPO2 with pulse oximeter and demonstrate accurate use  of the pulse oximeter.;To learn and demonstrate proper pursed lip breathing techniques or other breathing techniques.  To learn and exhibit compliance with exercise, home and travel O2 prescription;To learn and understand importance of maintaining oxygen saturations>88%;To learn and demonstrate proper use of respiratory medications;To learn and understand importance of monitoring SPO2 with pulse oximeter and demonstrate accurate use of the pulse oximeter.;To learn and demonstrate proper  pursed lip breathing techniques or other breathing techniques.  To learn and exhibit compliance with exercise, home and travel O2 prescription;To learn and understand importance of maintaining oxygen saturations>88%;To learn and demonstrate proper use of respiratory medications;To learn and understand importance of monitoring SPO2 with pulse oximeter and demonstrate accurate use of the pulse oximeter.;To learn and demonstrate proper pursed lip breathing techniques or other breathing techniques.    Long  Term Goals Exhibits compliance with exercise, home  and travel O2 prescription;Verbalizes importance of monitoring SPO2 with pulse oximeter and return demonstration;Maintenance of O2 saturations>88%;Exhibits proper breathing techniques, such as pursed lip breathing or other method taught during program session;Compliance with respiratory medication;Demonstrates proper use of MDI's Exhibits compliance with exercise, home  and travel O2 prescription;Verbalizes importance of monitoring SPO2 with pulse oximeter and return demonstration;Maintenance of O2 saturations>88%;Exhibits proper breathing techniques, such as pursed lip breathing or other method taught during program session;Compliance with respiratory medication;Demonstrates proper use of MDI's Exhibits compliance with exercise, home  and travel O2 prescription;Verbalizes importance of monitoring SPO2 with pulse oximeter and return demonstration;Maintenance of O2  saturations>88%;Exhibits proper breathing techniques, such as pursed lip breathing or other method taught during program session;Compliance with respiratory medication;Demonstrates proper use of MDI's Exhibits compliance with exercise, home  and travel O2 prescription;Verbalizes importance of monitoring SPO2 with pulse oximeter and return demonstration;Maintenance of O2 saturations>88%;Exhibits proper breathing techniques, such as pursed lip breathing or other method taught during program session;Compliance with respiratory medication;Demonstrates proper use of MDI's Exhibits compliance with exercise, home  and travel O2 prescription;Verbalizes importance of monitoring SPO2 with pulse oximeter and return demonstration;Maintenance of O2 saturations>88%;Exhibits proper breathing techniques, such as pursed lip breathing or other method taught during program session;Compliance with respiratory medication;Demonstrates proper use of MDI's   Goals/Expected Outcomes Compliance and understanding of oxygen saturation monitoring and breathing techniques to decrease shortness of breath. Compliance and understanding of oxygen saturation monitoring and breathing techniques to decrease shortness of breath. Compliance and understanding of oxygen saturation monitoring and breathing techniques to decrease shortness of breath. Compliance and understanding of oxygen saturation monitoring and breathing techniques to decrease shortness of breath. Compliance and understanding of oxygen saturation monitoring and breathing techniques to decrease shortness of breath.    Row Name 02/07/23 1607 03/11/23 0855           Program Oxygen Prescription   Program Oxygen Prescription Continuous Continuous      Liters per minute 6 6      Comments 4L on recumbent elliptical. 6L on treadmill 4L on recumbent elliptical. 6L on treadmill        Home Oxygen   Home Oxygen Device E-Tanks;Home Concentrator E-Tanks;Home Concentrator      Sleep  Oxygen Prescription Continuous Continuous      Liters per minute 4 4      Home Exercise Oxygen Prescription Continuous Continuous      Liters per minute 8 8      Home Resting Oxygen Prescription Continuous Continuous      Liters per minute 2 2      Compliance with Home Oxygen Use Yes Yes        Goals/Expected Outcomes   Short Term Goals To learn and exhibit compliance with exercise, home and travel O2 prescription;To learn and understand importance of maintaining oxygen saturations>88%;To learn and demonstrate proper use of respiratory medications;To learn and understand importance of monitoring SPO2 with pulse oximeter and demonstrate accurate use of the pulse oximeter.;To learn and demonstrate proper pursed lip breathing techniques or  other breathing techniques.  To learn and exhibit compliance with exercise, home and travel O2 prescription;To learn and understand importance of maintaining oxygen saturations>88%;To learn and demonstrate proper use of respiratory medications;To learn and understand importance of monitoring SPO2 with pulse oximeter and demonstrate accurate use of the pulse oximeter.;To learn and demonstrate proper pursed lip breathing techniques or other breathing techniques.       Long  Term Goals Exhibits compliance with exercise, home  and travel O2 prescription;Verbalizes importance of monitoring SPO2 with pulse oximeter and return demonstration;Maintenance of O2 saturations>88%;Exhibits proper breathing techniques, such as pursed lip breathing or other method taught during program session;Compliance with respiratory medication;Demonstrates proper use of MDI's Exhibits compliance with exercise, home  and travel O2 prescription;Verbalizes importance of monitoring SPO2 with pulse oximeter and return demonstration;Maintenance of O2 saturations>88%;Exhibits proper breathing techniques, such as pursed lip breathing or other method taught during program session;Compliance with respiratory  medication;Demonstrates proper use of MDI's      Goals/Expected Outcomes Compliance and understanding of oxygen saturation monitoring and breathing techniques to decrease shortness of breath. Compliance and understanding of oxygen saturation monitoring and breathing techniques to decrease shortness of breath.               Oxygen Discharge (Final Oxygen Re-Evaluation):  Oxygen Re-Evaluation - 03/11/23 0855       Program Oxygen Prescription   Program Oxygen Prescription Continuous    Liters per minute 6    Comments 4L on recumbent elliptical. 6L on treadmill      Home Oxygen   Home Oxygen Device E-Tanks;Home Concentrator    Sleep Oxygen Prescription Continuous    Liters per minute 4    Home Exercise Oxygen Prescription Continuous    Liters per minute 8    Home Resting Oxygen Prescription Continuous    Liters per minute 2    Compliance with Home Oxygen Use Yes      Goals/Expected Outcomes   Short Term Goals To learn and exhibit compliance with exercise, home and travel O2 prescription;To learn and understand importance of maintaining oxygen saturations>88%;To learn and demonstrate proper use of respiratory medications;To learn and understand importance of monitoring SPO2 with pulse oximeter and demonstrate accurate use of the pulse oximeter.;To learn and demonstrate proper pursed lip breathing techniques or other breathing techniques.     Long  Term Goals Exhibits compliance with exercise, home  and travel O2 prescription;Verbalizes importance of monitoring SPO2 with pulse oximeter and return demonstration;Maintenance of O2 saturations>88%;Exhibits proper breathing techniques, such as pursed lip breathing or other method taught during program session;Compliance with respiratory medication;Demonstrates proper use of MDI's    Goals/Expected Outcomes Compliance and understanding of oxygen saturation monitoring and breathing techniques to decrease shortness of breath.              Initial Exercise Prescription:   Perform Capillary Blood Glucose checks as needed.  Exercise Prescription Changes:   Exercise Prescription Changes     Row Name 10/02/22 1200 10/16/22 1200 10/30/22 1100 11/15/22 1221 11/27/22 1200     Response to Exercise   Blood Pressure (Admit) 118/62 110/64 118/60 120/58 --   Blood Pressure (Exercise) 134/62 120/64 128/62 -- --   Blood Pressure (Exit) 112/68 110/60 100/68 98/52 --   Heart Rate (Admit) 71 bpm 62 bpm 69 bpm 70 bpm --   Heart Rate (Exercise) 110 bpm 107 bpm 84 bpm -- --   Heart Rate (Exit) 83 bpm 77 bpm 72 bpm 88 bpm --   Oxygen Saturation (  Admit) 96 % 98 % 99 % 97 %  3L --   Oxygen Saturation (Exercise) 89 % 85 %  85% on 8L, 90% on 10L 99 % 92 %  10L --   Oxygen Saturation (Exit) 96 % 95 % 97 % 92 %  3L --   Rating of Perceived Exertion (Exercise) 13 13 11 13  --   Perceived Dyspnea (Exercise) 2 2 1 2  --   Duration Continue with 30 min of aerobic exercise without signs/symptoms of physical distress. Continue with 30 min of aerobic exercise without signs/symptoms of physical distress. Continue with 30 min of aerobic exercise without signs/symptoms of physical distress. Continue with 30 min of aerobic exercise without signs/symptoms of physical distress. --   Intensity THRR unchanged THRR unchanged THRR unchanged THRR unchanged --     Progression   Progression Continue to progress workloads to maintain intensity without signs/symptoms of physical distress. Continue to progress workloads to maintain intensity without signs/symptoms of physical distress. Continue to progress workloads to maintain intensity without signs/symptoms of physical distress. Continue to progress workloads to maintain intensity without signs/symptoms of physical distress. --     Resistance Training   Training Prescription Yes Yes Yes Yes --   Weight blue bands blue bands blue bands blue bands --   Reps 10-15 10-15 10-15 10-15 --   Time 10 Minutes 10 Minutes  10 Minutes 10 Minutes --     Oxygen   Oxygen Continuous Continuous Continuous Continuous --   Liters 8-10 8-10 8-10 --  3L with rest, 10L with exertion --     Treadmill   MPH 2 2.4 2.5 2.5 --   Grade 1 1.5 1.5 2 --   Minutes 15 15 15 15  --   METs 2.81 3.33 3.43 3.6 --     Recumbant Elliptical   Level 3 3 4 4  --   Minutes 15 15 15 15  --   METs 2.3 3.1 2.9 2.9 --     Oxygen   Maintain Oxygen Saturation 88% or higher 88% or higher 88% or higher 88% or higher --    Row Name 12/11/22 0900 12/25/22 0900 01/08/23 0900 01/22/23 0900 02/05/23 0900     Response to Exercise   Blood Pressure (Admit) 128/68 122/68 118/64 112/64 110/68   Blood Pressure (Exercise) 146/64 134/76 150/64 136/66 156/70   Blood Pressure (Exit) 118/68 114/60 112/58 114/62 118/68   Heart Rate (Admit) 79 bpm 77 bpm 78 bpm 65 bpm 72 bpm   Heart Rate (Exercise) 97 bpm 108 bpm 100 bpm 101 bpm 101 bpm   Heart Rate (Exit) 74 bpm 79 bpm 95 bpm 74 bpm 77 bpm   Oxygen Saturation (Admit) 98 %  4L 95 % 98 % 97 % 97 %   Oxygen Saturation (Exercise) 94 %  10L 90 % 93 % 90 % 90 %   Oxygen Saturation (Exit) 95 %  3L 91 % 96 % 95 % 96 %   Rating of Perceived Exertion (Exercise) 13 11 11 12 11    Perceived Dyspnea (Exercise) 1 1 1  1.5 1   Duration Continue with 30 min of aerobic exercise without signs/symptoms of physical distress. Continue with 30 min of aerobic exercise without signs/symptoms of physical distress. Continue with 30 min of aerobic exercise without signs/symptoms of physical distress. Continue with 30 min of aerobic exercise without signs/symptoms of physical distress. Continue with 30 min of aerobic exercise without signs/symptoms of physical distress.   Intensity THRR unchanged THRR unchanged  THRR unchanged THRR unchanged THRR unchanged     Progression   Progression Continue to progress workloads to maintain intensity without signs/symptoms of physical distress. Continue to progress workloads to maintain intensity  without signs/symptoms of physical distress. Continue to progress workloads to maintain intensity without signs/symptoms of physical distress. Continue to progress workloads to maintain intensity without signs/symptoms of physical distress. Continue to progress workloads to maintain intensity without signs/symptoms of physical distress.     Resistance Training   Training Prescription Yes Yes Yes Yes Yes   Weight blue bands black bands black bands black bands black bands   Reps 10-15 10-15 10-15 10-15 10-15   Time 10 Minutes 10 Minutes 10 Minutes 10 Minutes 10 Minutes     Interval Training   Interval Training No Yes Yes Yes Yes   Equipment -- Treadmill Treadmill Treadmill Treadmill   Comments -- 1 min @ @ 2 mph + 0 incline 1 min @ 2. @ + 1 1 min @ 3.2 mph and 1.5% incline, 1 min @ 2.2 mph and 0% incline 1 min @ 3.2 mph and 1.5% incline, 1 min @ 2.2 mph and 0% incline.     Oxygen   Oxygen Continuous Continuous Continuous Continuous Continuous   Liters 3-10L 1-6 1-6 1-6 1-6     Treadmill   MPH 2.5 3 3  3.2 3.2   Grade 2 1 1  1.5 1.5   Minutes 15 15 15 15 15    METs 3.6 3.5 3.5 3.9 3.9     Recumbant Elliptical   Level 4 5 5 5 2    Minutes 15 15 15 15 15    METs 3 3.1 3.5 3.3 3.2     Oxygen   Maintain Oxygen Saturation 88% or higher 88% or higher 88% or higher 88% or higher 88% or higher    Row Name 02/19/23 0900 03/05/23 0900 03/14/23 0931         Response to Exercise   Blood Pressure (Admit) 120/70 120/60 118/68     Blood Pressure (Exercise) 142/68 170/66 --     Blood Pressure (Exit) 128/70 110/62 108/68     Heart Rate (Admit) 68 bpm 82 bpm 70 bpm     Heart Rate (Exercise) 100 bpm 106 bpm 102 bpm     Heart Rate (Exit) 79 bpm 88 bpm 73 bpm     Oxygen Saturation (Admit) 100 % 97 % 96 %     Oxygen Saturation (Exercise) 93 % 90 % 98 %     Oxygen Saturation (Exit) 95 % 93 % 97 %     Rating of Perceived Exertion (Exercise) 12 13 11      Perceived Dyspnea (Exercise) 1 2 1       Duration Continue with 30 min of aerobic exercise without signs/symptoms of physical distress. Continue with 30 min of aerobic exercise without signs/symptoms of physical distress. Continue with 30 min of aerobic exercise without signs/symptoms of physical distress.     Intensity THRR unchanged THRR unchanged THRR unchanged       Progression   Progression Continue to progress workloads to maintain intensity without signs/symptoms of physical distress. Continue to progress workloads to maintain intensity without signs/symptoms of physical distress. Continue to progress workloads to maintain intensity without signs/symptoms of physical distress.     Average METs 3.3 3.3 --       Resistance Training   Training Prescription Yes Yes Yes     Weight black bands black bands black bands  Reps 10-15 10-15 10-15     Time 10 Minutes 10 Minutes 10 Minutes       Interval Training   Interval Training Yes Yes Yes     Equipment Treadmill Treadmill Treadmill     Comments 1 min @ 3.5 mph and 1.5% incline, 1 min @ 2.2 mph and 0% incline. 1 min @ 3.4 mph and 1.5% incline, 1 min @ 2.2 mph and 0% incline. 1 min @ 3.4 mph and 1.5% incline, 1 min @ 2.2 mph and 0% incline.       Oxygen   Oxygen Continuous Continuous Continuous     Liters 1-6 1-6 1-6       Treadmill   MPH 3.5 3.4 --     Grade 1.5 1.5 --     Minutes 15 15 --     METs 4.4 4.2 --       Recumbant Elliptical   Level 6 6 6      Watts 92 -- --     Minutes 15 15 15      METs 3.3 3.3 3.4       Oxygen   Maintain Oxygen Saturation 88% or higher -- 88% or higher              Exercise Comments:   Exercise Comments     Row Name 10/02/22 1539           Exercise Comments Completed home [facility]. Hannah Klein is currently walking at home. She walks 1 non-rehab day/wk for 10-15 min/day. We discussed Lung Transplant guidelines and building to meeting those guidelines. I mentioned walking 3 non-rehab days/wk for at least 20 min/day. She agreed with  my recommendations. I discussed increasing her walking time first and then frequency. I recommended doing a slow progression to meeting transplant guidelines. Hannah Klein agreed to my progression plan. We also discussed exercise strategies as Hannah Klein is walking outside. She is willing to try going to the local [employer] to avoid the heat and humidity. I discussed weather guidelines with [name]. She voiced understanding. She seems motivated to exercise and increase her functional capacity.                Exercise Goals and Review:   Exercise Goals     Row Name 09/21/22 1346 10/25/22 1617           Exercise Goals   Increase Physical Activity Yes Yes      Intervention Provide advice, education, support and counseling about physical activity/exercise needs.;Develop an individualized exercise prescription for aerobic and resistive training based on initial evaluation findings, risk stratification, comorbidities and participant's personal goals. Provide advice, education, support and counseling about physical activity/exercise needs.;Develop an individualized exercise prescription for aerobic and resistive training based on initial evaluation findings, risk stratification, comorbidities and participant's personal goals.      Expected Outcomes Short Term: Attend rehab on a regular basis to increase amount of physical activity.;Long Term: Exercising regularly at least 3-5 days a week.;Long Term: Add in home exercise to make exercise part of routine and to increase amount of physical activity. Short Term: Attend rehab on a regular basis to increase amount of physical activity.;Long Term: Exercising regularly at least 3-5 days a week.;Long Term: Add in home exercise to make exercise part of routine and to increase amount of physical activity.      Increase Strength and Stamina Yes Yes      Intervention Provide advice, education, support and counseling about physical activity/exercise needs.;Develop an  individualized  exercise prescription for aerobic and resistive training based on initial evaluation findings, risk stratification, comorbidities and participant's personal goals. Provide advice, education, support and counseling about physical activity/exercise needs.;Develop an individualized exercise prescription for aerobic and resistive training based on initial evaluation findings, risk stratification, comorbidities and participant's personal goals.      Expected Outcomes Short Term: Increase workloads from initial exercise prescription for resistance, speed, and METs.;Short Term: Perform resistance training exercises routinely during rehab and add in resistance training at home;Long Term: Improve cardiorespiratory fitness, muscular endurance and strength as measured by increased METs and functional capacity ( ) Short Term: Increase workloads from initial exercise prescription for resistance, speed, and METs.;Short Term: Perform resistance training exercises routinely during rehab and add in resistance training at home;Long Term: Improve cardiorespiratory fitness, muscular endurance and strength as measured by increased METs and functional capacity ( )      Able to understand and use rate of perceived exertion (RPE) scale Yes Yes      Intervention Provide education and explanation on how to use RPE scale Provide education and explanation on how to use RPE scale      Expected Outcomes Short Term: Able to use RPE daily in rehab to express subjective intensity level;Long Term:  Able to use RPE to guide intensity level when exercising independently Short Term: Able to use RPE daily in rehab to express subjective intensity level;Long Term:  Able to use RPE to guide intensity level when exercising independently      Able to understand and use Dyspnea scale Yes Yes      Intervention Provide education and explanation on how to use Dyspnea scale Provide education and explanation on how to use Dyspnea scale       Expected Outcomes Short Term: Able to use Dyspnea scale daily in rehab to express subjective sense of shortness of breath during exertion;Long Term: Able to use Dyspnea scale to guide intensity level when exercising independently Short Term: Able to use Dyspnea scale daily in rehab to express subjective sense of shortness of breath during exertion;Long Term: Able to use Dyspnea scale to guide intensity level when exercising independently      Knowledge and understanding of Target Heart Rate Range (THRR) Yes Yes      Intervention Provide education and explanation of THRR including how the numbers were predicted and where they are located for reference Provide education and explanation of THRR including how the numbers were predicted and where they are located for reference      Expected Outcomes Short Term: Able to state/look up THRR;Long Term: Able to use THRR to govern intensity when exercising independently;Short Term: Able to use daily as guideline for intensity in rehab Short Term: Able to state/look up THRR;Long Term: Able to use THRR to govern intensity when exercising independently;Short Term: Able to use daily as guideline for intensity in rehab      Understanding of Exercise Prescription Yes Yes      Intervention Provide education, explanation, and written materials on patient's individual exercise prescription Provide education, explanation, and written materials on patient's individual exercise prescription      Expected Outcomes Short Term: Able to explain program exercise prescription;Long Term: Able to explain home exercise prescription to exercise independently Short Term: Able to explain program exercise prescription;Long Term: Able to explain home exercise prescription to exercise independently               Exercise Goals Re-Evaluation :  Exercise Goals Re-Evaluation     Row  Name 09/21/22 1346 10/25/22 1618 11/20/22 1629 12/19/22 1029 01/16/23 1036     Exercise Goal  Re-Evaluation   Exercise Goals Review Increase Physical Activity;Able to understand and use Dyspnea scale;Understanding of Exercise Prescription;Increase Strength and Stamina;Knowledge and understanding of Target Heart Rate Range (THRR);Able to understand and use rate of perceived exertion (RPE) scale Increase Physical Activity;Able to understand and use Dyspnea scale;Understanding of Exercise Prescription;Increase Strength and Stamina;Knowledge and understanding of Target Heart Rate Range (THRR);Able to understand and use rate of perceived exertion (RPE) scale Increase Physical Activity;Able to understand and use Dyspnea scale;Understanding of Exercise Prescription;Increase Strength and Stamina;Knowledge and understanding of Target Heart Rate Range (THRR);Able to understand and use rate of perceived exertion (RPE) scale Increase Physical Activity;Able to understand and use Dyspnea scale;Understanding of Exercise Prescription;Increase Strength and Stamina;Knowledge and understanding of Target Heart Rate Range (THRR);Able to understand and use rate of perceived exertion (RPE) scale Increase Physical Activity;Able to understand and use Dyspnea scale;Understanding of Exercise Prescription;Increase Strength and Stamina;Knowledge and understanding of Target Heart Rate Range (THRR);Able to understand and use rate of perceived exertion (RPE) scale   Comments Hannah Klein has completed 8 exercise sessions. She exercises for 15 min on the recumbent elliptical and treadmill. She averages 2.5 METs at level 3 on the recumbent elliptical and 2.81 METs at 2 mph and 1% incline. She performs the warmup and cooldown standing without limitations. Hannah Klein has increased her workload for both exercise modes as METs have increased. She tolerates progressions well. Hannah Klein seems motivated to exercise and improve her functional capacity. She enjoys class as she converses with other patients. Will continue to monitor and progress as able.  Hannah Klein has completed 16 exercise sessions. She exercises for 15 min on the recumbent elliptical and treadmill. She averages 3.0 METs at level 4 on the recumbent elliptical and 3.1 METs at 2.5 mph and 1.5% incline. She performs the warmup and cooldown standing without limitations. Hannah Klein is still progressing her workloads steadily. She is determined to reach her full functional capacity in preparation for the [facility] lung transplant program. Hannah Klein has starting walking around her neighborhood on a regular basis. Will continue to monitor and progress as able. Hannah Klein has completed 21 exercise sessions. She exercises for 15 min on the recumbent elliptical and treadmill. She averages 3.0 METs at level 4 on the recumbent elliptical and 3.3 METs at 2.5 mph and 2% incline. She performs the warmup and cooldown standing without limitations. Hannah Klein is still trying to progress workloads. She increased her workload slightly on the treadmill. I have asked for an update on her home exercise regimen. Hannah Klein is walking out home on a regular basis. Will continue to monitor and progress as able. Hannah Klein has completed 27 exercise sessions. She exercises for 15 min on the recumbent elliptical and treadmill. She averages 3.6 METs at level 4 on the recumbent elliptical and 3.4 METs at 2.5 mph and 2% incline. She performs the warmup and cooldown standing without limitations. Hannah Klein has not increased her workload for either exercise mode although METs have increased on the recumbent elliptical. Hannah Klein is also progressing in her home exercise as her time has increased. Will continue to monitor and progress as able. Hannah Klein has completed 35 exercise sessions. She exercises for 15 min on the recumbent elliptical and treadmill. She averages 3.7 METs at level 5 on the recumbent elliptical and 3.7 METs at 3.2 mph and 1.5% incline. She performs the warmup and cooldown standing without limitations. Hannah Klein has increased her workload for both  exercise modes as METs have slightly increased. Hannah Klein has also progressed to interval training. She tolerates interval training well. Will continue to monitor and progress as able.   Expected Outcomes Through exercise at rehab and home, the patient will decrease shortness of breath with daily activities and feel confident in carrying out an exercise regimen at home. Through exercise at rehab and home, the patient will decrease shortness of breath with daily activities and feel confident in carrying out an exercise regimen at home. Through exercise at rehab and home, the patient will decrease shortness of breath with daily activities and feel confident in carrying out an exercise regimen at home. Through exercise at rehab and home, the patient will decrease shortness of breath with daily activities and feel confident in carrying out an exercise regimen at home. Through exercise at rehab and home, the patient will decrease shortness of breath with daily activities and feel confident in carrying out an exercise regimen at home.    Row Name 02/07/23 1602 03/11/23 0851           Exercise Goal Re-Evaluation   Exercise Goals Review Increase Physical Activity;Able to understand and use Dyspnea scale;Understanding of Exercise Prescription;Increase Strength and Stamina;Knowledge and understanding of Target Heart Rate Range (THRR);Able to understand and use rate of perceived exertion (RPE) scale Increase Physical Activity;Able to understand and use Dyspnea scale;Understanding of Exercise Prescription;Increase Strength and Stamina;Knowledge and understanding of Target Heart Rate Range (THRR);Able to understand and use rate of perceived exertion (RPE) scale      Comments Hannah Klein has completed 41 exercise sessions. She exercises for 15 min on the recumbent elliptical and treadmill. She averages 3.5 METs at level 5 on the recumbent elliptical and 2.8-3.5 METs at 3.2 mph and 1.5% incline and 2.2 mph. Will change  interval training to increase higher intensity time and decrease lower intensity time. Hannah Klein performs the warmup and cooldown standing without limitations. Will continue to monitor and progress as able. Hannah Klein has completed 49 exercise sessions. She exercises for 15 min on the recumbent elliptical and treadmill. She averages 3.2 METs at level 6 on the recumbent elliptical and 2.5-4.2 METs at 2.2 mph and 3.4 mph with 1.5% incline. Interval training has been increased as METs have slightly increased. Hannah Klein performs the warmup and cooldown standing without limitations. We have recently discussed home exercise again as Hannah Klein has mentioned buying a treadmill. She plans to use her treadmill at least 3x per week. I am confident in [place] exercising at home. Will continue to monitor and progress as able.      Expected Outcomes Through exercise at rehab and home, the patient will decrease shortness of breath with daily activities and feel confident in carrying out an exercise regimen at home. Through exercise at rehab and home, the patient will decrease shortness of breath with daily activities and feel confident in carrying out an exercise regimen at home.               Discharge Exercise Prescription (Final Exercise Prescription Changes):  Exercise Prescription Changes - 03/14/23 0931       Response to Exercise   Blood Pressure (Admit) 118/68    Blood Pressure (Exit) 108/68    Heart Rate (Admit) 70 bpm    Heart Rate (Exercise) 102 bpm    Heart Rate (Exit) 73 bpm    Oxygen Saturation (Admit) 96 %    Oxygen Saturation (Exercise) 98 %    Oxygen Saturation (Exit) 97 %    Rating  of Perceived Exertion (Exercise) 11    Perceived Dyspnea (Exercise) 1    Duration Continue with 30 min of aerobic exercise without signs/symptoms of physical distress.    Intensity THRR unchanged      Progression   Progression Continue to progress workloads to maintain intensity without signs/symptoms of physical  distress.      Resistance Training   Training Prescription Yes    Weight black bands    Reps 10-15    Time 10 Minutes      Interval Training   Interval Training Yes    Equipment Treadmill    Comments 1 min @ 3.4 mph and 1.5% incline, 1 min @ 2.2 mph and 0% incline.      Oxygen   Oxygen Continuous    Liters 1-6      Recumbant Elliptical   Level 6    Minutes 15    METs 3.4      Oxygen   Maintain Oxygen Saturation 88% or higher             Nutrition:  Target Goals: Understanding of nutrition guidelines, daily intake of sodium 1500mg , cholesterol 200mg , calories 30% from fat and 7% or less from saturated fats, daily to have 5 or more servings of fruits and vegetables.  Biometrics:    Nutrition Therapy Plan and Nutrition Goals:  Nutrition Therapy & Goals - 01/18/23 1541       Nutrition Therapy   Diet heart healthy diet    Drug/Food Interactions Statins/Certain Fruits      Personal Nutrition Goals   Nutrition Goal Patient to improve diet quality by using the plate method as a guide for meal planning to include lean protein/plant protein, fruits, vegetables, whole grains, nonfat dairy as part of a well-balanced diet.   goal in progress.   Personal Goal #2 Patient to identify strategies for weight loss of 0.5-2.0# per week.   goal in progress.   Personal Goal #3 Patient to identify food sources and limit daily intake of saturated fat, tran fat, sodium, and refined carbohdyrates.   goal in progress.   Comments Goals in action. We have discussed multiple strategies for weight loss including increasing low calorie dense foods, protein supplements, the plate method as a guide for meal planning, mindful eating strategies, and tracking intake. She was not successful with weight loss with these methods; recommended starting a Very Low Calorie Diet rich in protein and non-starchy vegetables for 30+ days to aid with creating consistent calorie deficit  Since then, she is down 18.5#  from her highest weight with our program (109.8kg).  Hannah Klein is currently working toward lung transplant through [place]. Per documenation with [facility] transplant dietitian, 6/13 and 9/26, she is working toward an initial weight loss goal of 203# and ultimatley a BMI of 27-28. She has maintained her weight over the last ~30 days. She will follow-up with [facility] transplant team 12/26. She reports that multiple weight loss drugs including wegovy have been denied by her insurance company. Patient will benefit from participation in pulmonary rehab for nutrition, exercise, and lifestyle modification.      Intervention Plan   Intervention Prescribe, educate and counsel regarding individualized specific dietary modifications aiming towards targeted core components such as weight, hypertension, lipid management, diabetes, heart failure and other comorbidities.;Nutrition handout(s) given to patient.    Expected Outcomes Short Term Goal: Understand basic principles of dietary content, such as calories, fat, sodium, cholesterol and nutrients.;Long Term Goal: Adherence to prescribed nutrition plan.;Short Term  Goal: A plan has been developed with personal nutrition goals set during dietitian appointment.             Nutrition Assessments:  MEDIFICTS Score Key: >=70 Need to make dietary changes  40-70 Heart Healthy Diet <= 40 Therapeutic Level Cholesterol Diet   Picture Your Plate Scores: <78 Unhealthy dietary pattern with much room for improvement. 41-50 Dietary pattern unlikely to meet recommendations for good health and room for improvement. 51-60 More healthful dietary pattern, with some room for improvement.  >60 Healthy dietary pattern, although there may be some specific behaviors that could be improved.    Nutrition Goals Re-Evaluation:  Nutrition Goals Re-Evaluation     Row Name 10/16/22 1123 11/13/22 1527 12/20/22 0905 01/18/23 1541       Goals   Current Weight 235 lb 7.2 oz (106.8 kg) 231 lb  14.8 oz (105.2 kg) 223 lb 8.7 oz (101.4 kg) 223 lb 8.7 oz (101.4 kg)    Comment 252 no new labs; most recent labs A1c WNL, cholesterol 247, triglycerides 252 no new labs; most recent labs A1c WNL, cholesterol 247, triglycerides 252 no new pertinent labs; most recent labs A1c WNL, cholesterol 247, triglycerides 252 no new pertinent labs; most recent labs A1c WNL, cholesterol 247, triglycerides 252    Expected Outcome Goals in progress. We have discussed multiple strategies for weight loss including increasing low calorie dense foods, protein supplements, the plate method as a guide for meal planning, mindful eating strategies, and tracking intake. She has not met weight loss goals and continues to struggle with oversnacking/boredom type snacking and over eating high fat foods (bacon). She is up 4# since starting with our program though down from her highest weight with our program. Hannah Klein is currently working toward lung transplant through [place]. Per documenation, 6/13, she is working toward an initial weight loss goal of 203# and ultimatley a BMI of 27-28. She reports that multiple weight loss drugs including wegovy have been denied by her insurance company. She is taking prednisone. Recommended starting a Very Low Calorie Diet for 14 days to aid with creating consistent calorie deficit; patient is receptive to this. Patient will benefit from participation in pulmonary rehab for nutrition, exercise, and lifestyle modification. Goals in action. We have discussed multiple strategies for weight loss including increasing low calorie dense foods, protein supplements, the plate method as a guide for meal planning, mindful eating strategies, and tracking intake. She was not successful with weight loss with these methods; recommended starting a Very Low Calorie Diet for 14-30 days to aid with creating consistent calorie deficit She starting this and is down 10# (105.2kg/231.4#) from her highest weight with our program  (109.8kg). Hannah Klein is currently working toward lung transplant through [place]. Per documenation, 6/13, she is working toward an initial weight loss goal of 203# and ultimatley a BMI of 27-28. She reports that multiple weight loss drugs including wegovy have been denied by her insurance company. Patient will benefit from participation in pulmonary rehab for nutrition, exercise, and lifestyle modification. Goals in action. We have discussed multiple strategies for weight loss including increasing low calorie dense foods, protein supplements, the plate method as a guide for meal planning, mindful eating strategies, and tracking intake. She was not successful with weight loss with these methods; recommended starting a Very Low Calorie Diet rich in protein and non-starchy vegetables for 30+ days to aid with creating consistent calorie deficit She is down 18.5# from her highest weight with our program (109.8kg). [employer]  is currently working toward lung transplant through [facility]. Per documenation with [facility] transplant dietitian, 6/13 and 9/26, she is working toward an initial weight loss goal of 203# and ultimatley a BMI of 27-28. She reports that multiple weight loss drugs including wegovy have been denied by her insurance company. Patient will benefit from participation in pulmonary rehab for nutrition, exercise, and lifestyle modification. Goals in action. We have discussed multiple strategies for weight loss including increasing low calorie dense foods, protein supplements, the plate method as a guide for meal planning, mindful eating strategies, and tracking intake. She was not successful with weight loss with these methods; recommended starting a Very Low Calorie Diet rich in protein and non-starchy vegetables for 30+ days to aid with creating consistent calorie deficit Since then, she is down 18.5# from her highest weight with our program (109.8kg). Hannah Klein is currently working toward lung transplant through [place]. Per  documenation with [facility] transplant dietitian, 6/13 and 9/26, she is working toward an initial weight loss goal of 203# and ultimatley a BMI of 27-28. She has maintained her weight over the last ~30 days. She will follow-up with [facility] transplant team 12/26. She reports that multiple weight loss drugs including wegovy have been denied by her insurance company. Patient will benefit from participation in pulmonary rehab for nutrition, exercise, and lifestyle modification.             Nutrition Goals Discharge (Final Nutrition Goals Re-Evaluation):  Nutrition Goals Re-Evaluation - 01/18/23 1541       Goals   Current Weight 223 lb 8.7 oz (101.4 kg)    Comment no new pertinent labs; most recent labs A1c WNL, cholesterol 247, triglycerides 252    Expected Outcome Goals in action. We have discussed multiple strategies for weight loss including increasing low calorie dense foods, protein supplements, the plate method as a guide for meal planning, mindful eating strategies, and tracking intake. She was not successful with weight loss with these methods; recommended starting a Very Low Calorie Diet rich in protein and non-starchy vegetables for 30+ days to aid with creating consistent calorie deficit Since then, she is down 18.5# from her highest weight with our program (109.8kg). Hannah Klein is currently working toward lung transplant through [place]. Per documenation with [facility] transplant dietitian, 6/13 and 9/26, she is working toward an initial weight loss goal of 203# and ultimatley a BMI of 27-28. She has maintained her weight over the last ~30 days. She will follow-up with [facility] transplant team 12/26. She reports that multiple weight loss drugs including wegovy have been denied by her insurance company. Patient will benefit from participation in pulmonary rehab for nutrition, exercise, and lifestyle modification.             Psychosocial: Target Goals: Acknowledge presence or absence of significant  depression and/or stress, maximize coping skills, provide positive support system. Participant is able to verbalize types and ability to use techniques and skills needed for reducing stress and depression.  Initial Review & Psychosocial Screening:   Quality of Life Scores:  Scores of 19 and below usually indicate a poorer quality of life in these areas.  A difference of  2-3 points is a clinically meaningful difference.  A difference of 2-3 points in the total score of the Quality of Life Index has been associated with significant improvement in overall quality of life, self-image, physical symptoms, and general health in studies assessing change in quality of life.  PHQ-9: Review Flowsheet  08/15/2022 08/14/2022 06/09/2021  Depression screen PHQ 2/9  Decreased Interest 1 1 1   Down, Depressed, Hopeless 0 0 0  PHQ - 2 Score 1 1 1   Altered sleeping 0 - 1  Tired, decreased energy 0 - 1  Change in appetite 1 - 0  Feeling bad or failure about yourself  0 - 0  Trouble concentrating 0 - 0  Moving slowly or fidgety/restless 0 - 0  Suicidal thoughts 0 - 0  PHQ-9 Score 2 - 3  Difficult doing work/chores Not difficult at all - Not difficult at all   Interpretation of Total Score  Total Score Depression Severity:  1-4 = Minimal depression, 5-9 = Mild depression, 10-14 = Moderate depression, 15-19 = Moderately severe depression, 20-27 = Severe depression   Psychosocial Evaluation and Intervention:   Psychosocial Re-Evaluation:  Psychosocial Re-Evaluation     Row Name 09/28/22 0910 10/22/22 1509 11/19/22 0914 12/17/22 1122 01/14/23 0951     Psychosocial Re-Evaluation   Current issues with None Identified None Identified None Identified None Identified None Identified   Comments Hannah Klein continues to deny any psychosocial barriers or concerns at this time. Hannah Klein again denies any needs from the psychosocial eval. She states she has a therapist she sees and is compliant with taking her  psychotropic meds. Hannah Klein is still being worked up for a possible lung transplant. She is optimistic about the evaluation. Hannah Klein again denies any needs from the psychosocial eval. She states she has a therapist she sees and is compliant with taking her psychotropic meds. Hannah Klein is still being worked up for a possible lung transplant. She is currently going to [facility] for procedures. She is optimistic about the evaluation. Hannah Klein again denies any new psychosocial barriers or concerns. She states she has a therapist and is compliant with taking her psychotropic meds. Hannah Klein is still being worked up for a possible lung transplant. She is optimistic about the evaluation. Hannah Klein again denies any new psychosocial barriers or concerns. She states she has finally been approved for disability. This has taken alot of stress off of her. She states she is in a good place as far as her mental health is concerned. She is working with her therapist and is compliant with taking her psychotropic meds. Hannah Klein is still being worked up for a possible lung transplant. She is optimistic about the evaluation. She is also happy about having to use less oxygen when exercising.   Expected Outcomes For Hannah Klein to continue participating in PR free of any psychosocial barriers or concerns For Hannah Klein to continue participating in PR free of any psychosocial barriers or concerns. For Hannah Klein to continue participating in PR free of any psychosocial barriers or concerns. For Hannah Klein to continue participating in PR free of any psychosocial barriers or concerns. For Hannah Klein to continue participating in PR free of any psychosocial barriers or concerns.   Interventions Encouraged to attend Pulmonary Rehabilitation for the exercise Encouraged to attend Pulmonary Rehabilitation for the exercise Encouraged to attend Pulmonary Rehabilitation for the exercise Encouraged to attend Pulmonary Rehabilitation for the exercise Encouraged to attend  Pulmonary Rehabilitation for the exercise   Continue Psychosocial Services  No Follow up required No Follow up required No Follow up required No Follow up required No Follow up required    Row Name 02/08/23 1424 03/18/23 0931           Psychosocial Re-Evaluation   Current issues with None Identified None Identified      Comments Hannah Klein continues to deny  any psy/soc needs during her monthly psychosocial evaluation. She states that her anxiety is well managed, and she is compliant with her psychotropic medication. She is relieved that she was approved for disability. She is currently being followed by [facility] for possible lung transplant. Since starting the program, she was able to wean down her oxygen. She is happy with the progress she's making as well as losing weight since starting. Hannah Klein has positive coping skills with a positive outlook. Psychosocial monthly re-evaluation is as follows: Hannah Klein continues to deny any psy/soc needs. Since titrating down her oxygen needs, she is still on the lung transplant list but currently not a priority. She was finally able to get her disability approved and states that her anxiety is well managed, and she is compliant with her psychotropic medication. She is happy with the progress she's making as well as losing weight. Hannah Klein has positive coping skills with a positive outlook.      Expected Outcomes For Hannah Klein to continue participating in PR free of any psychosocial barriers or concerns. For Hannah Klein to continue participating in PR free of any psychosocial barriers or concerns.      Interventions Encouraged to attend Pulmonary Rehabilitation for the exercise Encouraged to attend Pulmonary Rehabilitation for the exercise      Continue Psychosocial Services  No Follow up required No Follow up required               Psychosocial Discharge (Final Psychosocial Re-Evaluation):  Psychosocial Re-Evaluation - 03/18/23 0931       Psychosocial Re-Evaluation    Current issues with None Identified    Comments Psychosocial monthly re-evaluation is as follows: Hannah Klein continues to deny any psy/soc needs. Since titrating down her oxygen needs, she is still on the lung transplant list but currently not a priority. She was finally able to get her disability approved and states that her anxiety is well managed, and she is compliant with her psychotropic medication. She is happy with the progress she's making as well as losing weight. Hannah Klein has positive coping skills with a positive outlook.    Expected Outcomes For Hannah Klein to continue participating in PR free of any psychosocial barriers or concerns.    Interventions Encouraged to attend Pulmonary Rehabilitation for the exercise    Continue Psychosocial Services  No Follow up required             Education: Education Goals: Education classes will be provided on a weekly basis, covering required topics. Participant will state understanding/return demonstration of topics presented.  Learning Barriers/Preferences:   Education Topics: Know Your Numbers Group instruction that is supported by a PowerPoint presentation. Instructor discusses importance of knowing and understanding resting, exercise, and post-exercise oxygen saturation, heart rate, and blood pressure. Oxygen saturation, heart rate, blood pressure, rating of perceived exertion, and dyspnea are reviewed along with a normal range for these values.  Flowsheet Row PULMONARY REHAB OTHER RESPIRATORY from 02/28/2023 in [facility] for Heart, Vascular, & Lung Health  Date 02/28/23  Educator EP  Instruction Review Code 1- Verbalizes Understanding       Exercise for the Pulmonary Patient Group instruction that is supported by a PowerPoint presentation. Instructor discusses benefits of exercise, core components of exercise, frequency, duration, and intensity of an exercise routine, importance of utilizing pulse oximetry during  exercise, safety while exercising, and options of places to exercise outside of rehab.  Flowsheet Row PULMONARY REHAB OTHER RESPIRATORY from 02/21/2023 in [facility] for  Heart, Vascular, & Lung Health  Date 02/21/23  Educator EP  Instruction Review Code 1- Verbalizes Understanding       MET Level  Group instruction provided by PowerPoint, verbal discussion, and written material to support subject matter. Instructor reviews what METs are and how to increase METs.  Flowsheet Row PULMONARY REHAB OTHER RESPIRATORY from 01/17/2023 in [facility] for Heart, Vascular, & Lung Health  Date 01/17/23  Educator EP  Instruction Review Code 1- Verbalizes Understanding       Pulmonary Medications Verbally interactive group education provided by instructor with focus on inhaled medications and proper administration. Flowsheet Row PULMONARY REHAB OTHER RESPIRATORY from 11/15/2022 in [facility] for Heart, Vascular, & Lung Health  Date 11/15/22  Educator RT  Instruction Review Code 1- Verbalizes Understanding       Anatomy and Physiology of the Respiratory System Group instruction provided by PowerPoint, verbal discussion, and written material to support subject matter. Instructor reviews respiratory cycle and anatomical components of the respiratory system and their functions. Instructor also reviews differences in obstructive and restrictive respiratory diseases with examples of each.  Flowsheet Row PULMONARY REHAB OTHER RESPIRATORY from 02/07/2023 in [facility] for Heart, Vascular, & Lung Health  Date 02/07/23  Educator RT  Instruction Review Code 1- Verbalizes Understanding       Oxygen Safety Group instruction provided by PowerPoint, verbal discussion, and written material to support subject matter. There is an overview of "What is Oxygen" and "Why do we need it".  Instructor also reviews how  to create a safe environment for oxygen use, the importance of using oxygen as prescribed, and the risks of noncompliance. There is a brief discussion on traveling with oxygen and resources the patient may utilize. Flowsheet Row PULMONARY REHAB OTHER RESPIRATORY from 03/07/2023 in [facility] for Heart, Vascular, & Lung Health  Date 03/07/23  Educator RN  Instruction Review Code 1- Verbalizes Understanding       Oxygen Use Group instruction provided by PowerPoint, verbal discussion, and written material to discuss how supplemental oxygen is prescribed and different types of oxygen supply systems. Resources for more information are provided.  Flowsheet Row PULMONARY REHAB OTHER RESPIRATORY from 03/14/2023 in [facility] for Heart, Vascular, & Lung Health  Date 03/14/23  Educator RT  Instruction Review Code 1- Verbalizes Understanding       Breathing Techniques Group instruction that is supported by demonstration and informational handouts. Instructor discusses the benefits of pursed lip and diaphragmatic breathing and detailed demonstration on how to perform both.  Flowsheet Row PULMONARY REHAB OTHER RESPIRATORY from 12/20/2022 in [facility] for Heart, Vascular, & Lung Health  Date 12/20/22  Educator RN  Instruction Review Code 1- Verbalizes Understanding        Risk Factor Reduction Group instruction that is supported by a PowerPoint presentation. Instructor discusses the definition of a risk factor, different risk factors for pulmonary disease, and how the heart and lungs work together. Flowsheet Row PULMONARY REHAB OTHER RESPIRATORY from 01/10/2023 in [facility] for Heart, Vascular, & Lung Health  Date 01/10/23  Educator EP  Instruction Review Code 1- Verbalizes Understanding       Pulmonary Diseases Group instruction provided by PowerPoint, verbal discussion, and written material  to support subject matter. Instructor gives an overview of the different type of pulmonary diseases. There is also a discussion on risk factors and symptoms  as well as ways to manage the diseases. Flowsheet Row PULMONARY REHAB OTHER RESPIRATORY from 01/31/2023 in [facility] for Heart, Vascular, & Lung Health  Date 01/31/23  Educator RT  Instruction Review Code 1- Verbalizes Understanding       Stress and Energy Conservation Group instruction provided by PowerPoint, verbal discussion, and written material to support subject matter. Instructor gives an overview of stress and the impact it can have on the body. Instructor also reviews ways to reduce stress. There is also a discussion on energy conservation and ways to conserve energy throughout the day. Flowsheet Row PULMONARY REHAB OTHER RESPIRATORY from 12/27/2022 in [facility] for Heart, Vascular, & Lung Health  Date 12/27/22  Educator RN  Instruction Review Code 1- Verbalizes Understanding       Warning Signs and Symptoms Group instruction provided by PowerPoint, verbal discussion, and written material to support subject matter. Instructor reviews warning signs and symptoms of stroke, heart attack, cold and flu. Instructor also reviews ways to prevent the spread of infection. Flowsheet Row PULMONARY REHAB OTHER RESPIRATORY from 01/03/2023 in [facility] for Heart, Vascular, & Lung Health  Date 01/03/23  Educator RN  Instruction Review Code 1- Verbalizes Understanding       Other Education Group or individual verbal, written, or video instructions that support the educational goals of the pulmonary rehab program. Flowsheet Row PULMONARY REHAB OTHER RESPIRATORY from 11/01/2022 in [facility] for Heart, Vascular, & Lung Health  Date 11/01/22  Educator RT  Instruction Review Code 1- Verbalizes Understanding        Knowledge Questionnaire  Score:   Core Components/Risk Factors/Patient Goals at Admission:   Core Components/Risk Factors/Patient Goals Review:   Goals and Risk Factor Review     Row Name 09/28/22 0913 10/22/22 1512 11/19/22 0915 12/17/22 1124 01/14/23 0955     Core Components/Risk Factors/Patient Goals Review   Personal Goals Review Weight Management/Obesity;Improve shortness of breath with ADL's Weight Management/Obesity;Improve shortness of breath with ADL's Weight Management/Obesity;Improve shortness of breath with ADL's Weight Management/Obesity;Improve shortness of breath with ADL's Weight Management/Obesity;Improve shortness of breath with ADL's   Review Goal progressing for weight loss. Hannah Klein has been working with our dietician for weight loss goals. Goal progressing on improving her shortness of breath with ADLs. Hannah Klein is being worked up for lung transplant and remains optimistic through this process. We will continue to monitor her progress throughout the program. Goal in progress to lose weight and make healthy choices. Hannah Klein is working with our dietician on healthy eating habits and less snacking. Per [facility] lung transplant team recommendations, Hannah Klein will need to lose weight before surgery. Our dietician has recently suggested a low calorie 14-day plan. Hannah Klein was excited to see that she lost weight last week. Hopefully this will jump start her weight loss journey and give her the confidence that she can lose weight. Goal in progress on improving her shortness of breath with ADLs. Hannah Klein is maintaining her oxygen saturation of 88% or higher on 8-10L with exertion. She is able to titrate down to 2L at rest. Hannah Klein will continue to benefit from PR for nutrition, education, exercise, and lifestyle modification. Goal in progress to lose weight. Hannah Klein is working with our dietician on healthy eating habits and less snacking. Per [facility] lung transplant team recommendations, Hannah Klein will need to lose weight before  surgery. She is currently down ~8#. Goal in progress on improving her shortness of breath  with ADLs. Hannah Klein is maintaining her oxygen saturation of 88% or higher on 8-10L with exertion. She has increased both his workload and METs while maintain on her current oxygen prescription. Hannah Klein will continue to benefit from PR for nutrition, education, exercise, and lifestyle modification. Goal in progress to lose weight. Hannah Klein is working with our dietician to achieve her weight loss goals. Per [facility] lung transplant team recommendations, Hannah Klein will need to lose weight before surgery. She is currently down 10#. Goal in progress on improving her shortness of breath with ADLs. Hannah Klein is maintaining her oxygen saturation of 88% or higher on 8-10L with exertion. She has increased both his workload and METs while maintain on her current oxygen prescription. Hannah Klein will continue to benefit from PR for nutrition, education, exercise, and lifestyle modification. Goal in progress to lose weight. Hannah Klein is working with our dietician to achieve her weight loss goals. She is currently down 18# from her highest weigh since starting the program. Goal in progress on improving her shortness of breath with ADLs. Hannah Klein is maintaining her oxygen saturation of 88% or higher on 6-8L with exertion. She has increased both his workload and METs while maintaining on her current oxygen prescription. Hannah Klein will continue to benefit from PR for nutrition, education, exercise, and lifestyle modification.   Expected Outcomes See admission goals. For [name] to lose weight and improve her shortness of breath with ADLs For [name] to lose weight and improve her shortness of breath with ADLs For [name] to lose weight and improve her shortness of breath with ADLs For [name] to lose weight and improve her shortness of breath with ADLs    Row Name 02/08/23 1610 03/18/23 0934           Core Components/Risk Factors/Patient Goals Review    Personal Goals Review Weight Management/Obesity;Improve shortness of breath with ADL's Weight Management/Obesity;Improve shortness of breath with ADL's      Review Monthly review of patient's Core Components/Risk Factors/Patient Goals are as follows: Goal is progressing for weight loss. Hannah Klein has lost ~11.6# since starting the program. Weight loss is a goal for Hannah Klein because she must meet criteria for a possible lung transplant. She worked with out dietician to formulate a plan that works for her. She is happy with the progress so far. Goal progressing on improving [name]'s shortness of breath with ADLs. Hannah Klein states since starting the program she went from "couch potato" to someone who enjoys exercise. She has completed 20 weeks of exercise with [name] so far. She has been able to wean down her oxygen with exertion, improve her METs, and her workload. Hannah Klein enjoys practicing interval training on the treadmill. We will continue to follow along with [facility] and re-evaluate her goals monthly. Hannah Klein will continue to benefit from participation in PR for nutrition, education, exercise, and lifestyle modification. Monthly review of patient's Core Components/Risk Factors/Patient Goals are as follows: Goal is progressing for weight loss. Hannah Klein has lost ~7.9# since starting the program. She worked with our dietician to formulate a plan that works for her. Her weight has been up and down during the program. Goal progressing on improving shortness of breath with ADLs. She has completed 51 sessions and has been able to improve her METs, and her workload. Hannah Klein enjoys practicing interval training on the treadmill. We will continue to follow along with [facility] and re-evaluate her goals monthly. Hannah Klein will continue to benefit from participation in PR for nutrition, education, exercise, and lifestyle modification.      Expected  Outcomes For [name] to lose weight and improve her shortness of breath with ADLs For  [name] to lose weight and improve her shortness of breath with ADLs               Core Components/Risk Factors/Patient Goals at Discharge (Final Review):   Goals and Risk Factor Review - 03/18/23 0934       Core Components/Risk Factors/Patient Goals Review   Personal Goals Review Weight Management/Obesity;Improve shortness of breath with ADL's    Review Monthly review of patient's Core Components/Risk Factors/Patient Goals are as follows: Goal is progressing for weight loss. Hannah Klein has lost ~7.9# since starting the program. She worked with our dietician to formulate a plan that works for her. Her weight has been up and down during the program. Goal progressing on improving shortness of breath with ADLs. She has completed 51 sessions and has been able to improve her METs, and her workload. Hannah Klein enjoys practicing interval training on the treadmill. We will continue to follow along with [facility] and re-evaluate her goals monthly. Hannah Klein will continue to benefit from participation in PR for nutrition, education, exercise, and lifestyle modification.    Expected Outcomes For [name] to lose weight and improve her shortness of breath with ADLs             ITP Comments: Pt is making expected progress toward Pulmonary Rehab goals after completing 51 session(s). Recommend continued exercise, life style modification, education, and utilization of breathing techniques to increase stamina and strength, while also decreasing shortness of breath with exertion.  Dr. Mechele Collin is Medical Director for Pulmonary Rehab at [facility].

## 2023-03-21 ENCOUNTER — Encounter (HOSPITAL_COMMUNITY)
Admission: RE | Admit: 2023-03-21 | Discharge: 2023-03-21 | Disposition: A | Discharge status: Home / Self Care | Payer: Medicaid Other | Source: Ambulatory Visit | Attending: Pulmonary Disease | Admitting: Pulmonary Disease

## 2023-03-21 DIAGNOSIS — J849 Interstitial pulmonary disease, unspecified: Secondary | ICD-10-CM | POA: Diagnosis not present

## 2023-03-21 NOTE — Telephone Encounter (Signed)
Please check on status of the PA. Patient states her insurance is requiring more information in order to approve.

## 2023-03-21 NOTE — Progress Notes (Signed)
Daily Session Note  Patient Details  Name: Hannah Klein MRN: [id number] Date of Birth: Sep 27, 1975 Referring Provider:   Doristine Devoid Pulmonary Rehab Walk Test from 08/15/2022 in [facility] for Heart, Vascular, & Lung Health  Referring Provider Soskis  [sees [name]]       Encounter Date: 03/21/2023  Check In:   Capillary Blood Glucose: No results found for this or any previous visit (from the past 24 hours).    Social History   Tobacco Use  Smoking Status Former   Current packs/day: 0.00   Average packs/day: 2.0 packs/day for 18.0 years (36.1 ttl pk-yrs)   Types: Cigarettes   Start date: 97   Quit date: 03/16/2007   Years since quitting: 16.0   Passive exposure: Current  Smokeless Tobacco Never    Goals Met:  Proper associated with RPD/PD & O2 Sat Independence with exercise equipment Exercise tolerated well No report of concerns or symptoms today Strength training completed today  Goals Unmet:  Not Applicable  Comments: Service time is from 0811 to 0925.    Dr. Mechele Collin is Medical Director for Pulmonary Rehab at [facility].

## 2023-03-21 NOTE — Telephone Encounter (Signed)
Our team hasn't received any requests for additional information or a denial from pt's insurance yet, has your office received any correspondence so that I may follow up on denial? Going to attempt a resubmission arguing that pt has sleep apnea for zepbound, along with chart notes showing pt's exercise plan and need of lung transplant. If any communication is received from insurance, please fax to [phone] with ATTN: Okema Rollinson, so that I can follow up ASAP. Thank you!

## 2023-03-21 NOTE — Telephone Encounter (Signed)
Pharmacy Patient Advocate Encounter   Received notification from Patient Advice Request messages that prior authorization for Zepbound is required/requested.   Insurance verification completed.   The patient is insured through [facility] .   Per test claim: PA required; PA submitted to above mentioned insurance via CoverMyMeds Key/confirmation #/EOC [id number] Status is pending

## 2023-03-22 ENCOUNTER — Ambulatory Visit: Payer: Medicaid Other | Admitting: Primary Care

## 2023-03-22 MED ORDER — WEGOVY 0.25 MG/0.5ML ~~LOC~~ SOAJ: 0.2500 mg | SUBCUTANEOUS | 0 refills | Status: DC

## 2023-03-22 NOTE — Telephone Encounter (Signed)
Noted

## 2023-03-22 NOTE — Telephone Encounter (Signed)
Pharmacy Patient Advocate Encounter  Received notification from [facility] that Prior Authorization for Zepbound 2.5MG /0.5ML pen-injectors has been DENIED.  No reason given; No denial letter received via Fax or CMM. It has been requested and will be uploaded to the media tab once received.   PA #/Case ID/Reference #: [id number]

## 2023-03-26 ENCOUNTER — Encounter (HOSPITAL_COMMUNITY)
Admission: RE | Admit: 2023-03-26 | Discharge: 2023-03-26 | Disposition: A | Discharge status: Home / Self Care | Payer: Medicaid Other | Source: Ambulatory Visit | Attending: Pulmonary Disease

## 2023-03-26 DIAGNOSIS — J849 Interstitial pulmonary disease, unspecified: Secondary | ICD-10-CM

## 2023-03-26 DIAGNOSIS — M3502 Sicca syndrome with lung involvement: Secondary | ICD-10-CM

## 2023-03-26 DIAGNOSIS — Z79899 Other long term (current) drug therapy: Secondary | ICD-10-CM

## 2023-03-26 NOTE — Progress Notes (Signed)
Daily Session Note  Patient Details  Name: Hannah Klein MRN: [id number] Date of Birth: [birth year] Referring Provider:   Doristine Devoid Pulmonary Rehab Walk Test from 08/15/2022 in [facility] for [facility], V[facility], & Lung Health  Referring Provider Soskis  [sees [name]]       Encounter Date: 03/26/2023  Check In:  Session Check In - 03/26/23 3086       Check-In   Supervising physician immediately available to respond to emergencies [facility] MD immediately available    Physician(s) Tereso Newcomer, NP    Location [facility]    Staff Present Durel Salts, Zella Richer, MS, ACSM-CEP, Exercise Physiologist;Mary Gerre Scull, RN, BSN    Virtual Visit No    Medication changes reported     No    Fall or balance concerns reported    No    Tobacco Cessation No Change    Warm-up and Cool-down Performed as group-led instruction    Resistance Training Performed Yes    VAD Patient? No    PAD/SET Patient? No      Pain Assessment   Currently in Pain? No/denies    Multiple Pain Sites No             Capillary Blood Glucose: No results found for this or any previous visit (from the past 24 hours).    Social History   Tobacco Use  Smoking Status Former   Current packs/day: 0.00   Average packs/day: 2.0 packs/day for 18.0 years (36.1 ttl pk-yrs)   Types: Cigarettes   Start date: 32   Quit date: 03/16/2007   Years since quitting: 16.0   Passive exposure: Current  Smokeless Tobacco Never    Goals Met:  Proper associated with RPD/PD & O2 Sat Independence with exercise equipment Exercise tolerated well No report of concerns or symptoms today Strength training completed today  Goals Unmet:  Not Applicable  Comments: Service time is from 0818 to 0920.    Dr. Mechele Collin is Medical Director for Pulmonary Rehab at [facility].

## 2023-03-27 DIAGNOSIS — F54 Psychological and behavioral factors associated with disorders or diseases classified elsewhere: Secondary | ICD-10-CM | POA: Diagnosis not present

## 2023-03-27 DIAGNOSIS — Z7682 Awaiting organ transplant status: Secondary | ICD-10-CM | POA: Diagnosis not present

## 2023-03-27 NOTE — Telephone Encounter (Signed)
Can you check on the prior authorization for her Wegovy?

## 2023-03-28 ENCOUNTER — Other Ambulatory Visit: Payer: Self-pay | Admitting: Primary Care

## 2023-03-28 ENCOUNTER — Encounter (HOSPITAL_COMMUNITY)
Admission: RE | Admit: 2023-03-28 | Discharge: 2023-03-28 | Disposition: A | Discharge status: Home / Self Care | Payer: Medicaid Other | Source: Ambulatory Visit | Attending: Pulmonary Disease | Admitting: Pulmonary Disease

## 2023-03-28 DIAGNOSIS — F411 Generalized anxiety disorder: Secondary | ICD-10-CM

## 2023-03-28 DIAGNOSIS — J849 Interstitial pulmonary disease, unspecified: Secondary | ICD-10-CM | POA: Diagnosis not present

## 2023-03-28 DIAGNOSIS — G4733 Obstructive sleep apnea (adult) (pediatric): Secondary | ICD-10-CM | POA: Diagnosis not present

## 2023-03-28 NOTE — Progress Notes (Signed)
Daily Session Note  Patient Details  Name: Hannah Klein MRN: [id number] Date of Birth: Nov 27, 1975 Referring Provider:   Doristine Devoid Pulmonary Rehab Walk Test from 08/15/2022 in [facility] for Heart, Vascular, & Lung Health  Referring Provider Soskis  [sees [name]]       Encounter Date: 03/28/2023  Check In:  Session Check In - 03/28/23 0831       Check-In   Supervising physician immediately available to respond to emergencies [facility] MD immediately available    Physician(s) Edd Fabian, NP    [facility]    Staff Present Durel Salts, Zella Richer, MS, ACSM-CEP, Exercise Physiologist;Mary Gerre Scull, RN, BSN;Randi Reeve BS, ACSM-CEP, Exercise Physiologist    Virtual Visit No    Medication changes reported     No    Fall or balance concerns reported    No    Tobacco Cessation No Change    Warm-up and Cool-down Performed as group-led instruction    Resistance Training Performed Yes    VAD Patient? No    PAD/SET Patient? No      Pain Assessment   Currently in Pain? No/denies    Multiple Pain Sites No             Capillary Blood Glucose: No results found for this or any previous visit (from the past 24 hours).    Social History   Tobacco Use  Smoking Status Former   Current packs/day: 0.00   Average packs/day: 2.0 packs/day for 18.0 years (36.1 ttl pk-yrs)   Types: Cigarettes   Start date: 53   Quit date: 03/16/2007   Years since quitting: 16.0   Passive exposure: Current  Smokeless Tobacco Never    Goals Met:  Proper associated with RPD/PD & O2 Sat Independence with exercise equipment Exercise tolerated well No report of concerns or symptoms today Strength training completed today  Goals Unmet:  Not Applicable  Comments: Service time is from 0814 to 0933.    Dr. Mechele Collin is Medical Director for Pulmonary Rehab at [facility].

## 2023-03-28 NOTE — Telephone Encounter (Signed)
Please check on the prior authorization for her Wegovy? Please submit

## 2023-03-29 NOTE — Telephone Encounter (Signed)
 Patient has been scheduled

## 2023-03-29 NOTE — Telephone Encounter (Signed)
Patient is due for CPE/follow up in mid April. Please schedule, thank you!

## 2023-04-01 ENCOUNTER — Telehealth: Payer: Self-pay

## 2023-04-01 NOTE — Telephone Encounter (Signed)
Please call patient:  Read her the denial letter. Happy to see her anytime at her convenience.

## 2023-04-01 NOTE — Telephone Encounter (Signed)
PA request has been Cancelled. New Encounter created for follow up. For additional info see Pharmacy Prior Auth telephone encounter from 02/03.

## 2023-04-01 NOTE — Telephone Encounter (Signed)
*  Primary  Pharmacy Patient Advocate Encounter  Received notification from [facility] that Prior Authorization for Hannah Klein has been CANCELLED due to: patient does not meet requirements. Office visit needed within the past 45 days-last one was June 24'. See denial letter attached in patients file as this was the same reason the Zepbound was denied. Notes that are needed are in the screenshot below.

## 2023-04-02 ENCOUNTER — Encounter (HOSPITAL_COMMUNITY)
Admission: RE | Admit: 2023-04-02 | Discharge: 2023-04-02 | Disposition: A | Discharge status: Home / Self Care | Payer: Medicaid Other | Source: Ambulatory Visit | Attending: Pulmonary Disease | Admitting: Pulmonary Disease

## 2023-04-02 VITALS — Wt 228.0 lb

## 2023-04-02 DIAGNOSIS — J849 Interstitial pulmonary disease, unspecified: Secondary | ICD-10-CM | POA: Insufficient documentation

## 2023-04-02 NOTE — Telephone Encounter (Signed)
Discussed with patient, scheduled appt 04/04/23.

## 2023-04-02 NOTE — Progress Notes (Signed)
Daily Session Note  Patient Details  Name: ATHANASIA STANWOOD MRN: [id number] Date of Birth: [birth year] Referring Provider:   Doristine Devoid Pulmonary Rehab Walk Test from 08/15/2022 in [facility] for Heart, Vascular, & Lung Health  Referring Provider Soskis  [sees [name]]       Encounter Date: 04/02/2023  Check In:  Session Check In - 04/02/23 0820       Check-In   Supervising physician immediately available to respond to emergencies [facility] MD immediately available    Physician(s) Edd Fabian, NP    [facility]    Staff Present Durel Salts, Zella Richer, MS, ACSM-CEP, Exercise Physiologist;Alila Sotero Dionisio Paschal, ACSM-CEP, Exercise Physiologist    Virtual Visit No    Medication changes reported     No    Fall or balance concerns reported    No    Tobacco Cessation No Change    Warm-up and Cool-down Performed as group-led instruction    Resistance Training Performed Yes    VAD Patient? No    PAD/SET Patient? No      Pain Assessment   Currently in Pain? No/denies    Multiple Pain Sites No             Capillary Blood Glucose: No results found for this or any previous visit (from the past 24 hours).   Exercise Prescription Changes - 04/02/23 0900       Response to Exercise   Blood Pressure (Admit) 112/70    Blood Pressure (Exercise) 160/70    Blood Pressure (Exit) 110/66    Heart Rate (Admit) 70 bpm    Heart Rate (Exercise) 102 bpm    Heart Rate (Exit) 79 bpm    Oxygen Saturation (Admit) 100 %    Oxygen Saturation (Exercise) 91 %    Oxygen Saturation (Exit) 94 %    Rating of Perceived Exertion (Exercise) 12    Perceived Dyspnea (Exercise) 2    Duration Continue with 30 min of aerobic exercise without signs/symptoms of physical distress.    Intensity THRR unchanged      Progression   Progression Continue to progress workloads to maintain intensity without signs/symptoms of physical distress.    Average METs 3.3       Resistance Training   Training Prescription Yes    Weight black bands    Reps 10-15    Time 10 Minutes      Interval Training   Interval Training Yes    Equipment Treadmill    Comments 1 min @ 3.4 mph and 1.5% incline, 1 min @ 2.2 mph and 0% incline.      Oxygen   Oxygen Continuous    Liters 1-6      Recumbant Elliptical   Level 6    Minutes 15    METs 3.3      Oxygen   Maintain Oxygen Saturation 88% or higher             Social History   Tobacco Use  Smoking Status Former   Current packs/day: 0.00   Average packs/day: 2.0 packs/day for 18.0 years (36.1 ttl pk-yrs)   Types: Cigarettes   Start date: 56   Quit date: 03/16/2007   Years since quitting: 16.0   Passive exposure: Current  Smokeless Tobacco Never    Goals Met:  Independence with exercise equipment Exercise tolerated well No report of concerns or symptoms today Strength training completed today  Goals Unmet:  Not Applicable  Comments: Service time is from 0810 to 0930.    Dr. Mechele Collin is Medical Director for Pulmonary Rehab at [facility].

## 2023-04-03 ENCOUNTER — Other Ambulatory Visit: Payer: Self-pay | Admitting: Pulmonary Disease

## 2023-04-03 ENCOUNTER — Other Ambulatory Visit (HOSPITAL_COMMUNITY): Payer: Self-pay

## 2023-04-03 DIAGNOSIS — Z7682 Awaiting organ transplant status: Secondary | ICD-10-CM | POA: Diagnosis not present

## 2023-04-03 DIAGNOSIS — F54 Psychological and behavioral factors associated with disorders or diseases classified elsewhere: Secondary | ICD-10-CM | POA: Diagnosis not present

## 2023-04-04 ENCOUNTER — Encounter (HOSPITAL_COMMUNITY)
Admission: RE | Admit: 2023-04-04 | Discharge: 2023-04-04 | Disposition: A | Discharge status: Home / Self Care | Payer: Medicaid Other | Source: Ambulatory Visit | Attending: Pulmonary Disease | Admitting: Pulmonary Disease

## 2023-04-04 ENCOUNTER — Ambulatory Visit: Payer: Medicaid Other | Admitting: Primary Care

## 2023-04-04 DIAGNOSIS — J849 Interstitial pulmonary disease, unspecified: Secondary | ICD-10-CM

## 2023-04-04 NOTE — Progress Notes (Signed)
 Daily Session Note  Patient Details  Name: Hannah Klein MRN: [id number] Date of Birth: 05-25-1975 Referring Provider:   Conrad Klein Pulmonary Rehab Walk Test from 08/15/2022 in [facility] for Heart, Vascular, & Lung Health  Referring Provider Hannah Klein  [sees Hannah Klein]       Encounter Date: 04/04/2023  Check In:  Session Check In - 04/04/23 9180       Check-In   Supervising physician immediately available to respond to emergencies [facility] MD immediately available    Physician(s) Hannah Mose, NP    [facility]    Staff Present Hannah Klein, Hannah Moats, MS, ACSM-CEP, Exercise Physiologist;Hannah Klein, ACSM-CEP, Exercise Physiologist;Hannah Harvy, RN, BSN    Virtual Visit No    Medication changes reported     No    Fall or balance concerns reported    No    Tobacco Cessation No Change    Warm-up and Cool-down Performed as group-led instruction    Resistance Training Performed Yes    VAD Patient? No    PAD/SET Patient? No      Pain Assessment   Currently in Pain? No/denies    Multiple Pain Sites No             Capillary Blood Glucose: No results found for this or any previous visit (from the past 24 hours).    Social History   Tobacco Use  Smoking Status Former   Current packs/day: 0.00   Average packs/day: 2.0 packs/day for 18.0 years (36.1 ttl pk-yrs)   Types: Cigarettes   Start date: 89   Quit date: 03/16/2007   Years since quitting: 16.0   Passive exposure: Current  Smokeless Tobacco Never    Goals Met:  Proper associated with RPD/PD & O2 Sat Independence with exercise equipment Exercise tolerated well No report of concerns or symptoms today Strength training completed today  Goals Unmet:  Not Applicable  Comments: Service time is from 0812 to 0930.    Dr. Slater Staff is Medical Director for Pulmonary Rehab at [facility].

## 2023-04-05 ENCOUNTER — Other Ambulatory Visit (INDEPENDENT_AMBULATORY_CARE_PROVIDER_SITE_OTHER): Payer: Medicaid Other

## 2023-04-05 ENCOUNTER — Ambulatory Visit: Payer: Medicaid Other | Admitting: Primary Care

## 2023-04-05 ENCOUNTER — Encounter: Payer: Self-pay | Admitting: Primary Care

## 2023-04-05 VITALS — BP 132/72 | HR 67 | Temp 99.5°F | Ht 69.0 in | Wt 228.0 lb

## 2023-04-05 DIAGNOSIS — E66811 Obesity, class 1: Secondary | ICD-10-CM

## 2023-04-05 DIAGNOSIS — J849 Interstitial pulmonary disease, unspecified: Secondary | ICD-10-CM

## 2023-04-05 DIAGNOSIS — Z79899 Other long term (current) drug therapy: Secondary | ICD-10-CM

## 2023-04-05 DIAGNOSIS — E6609 Other obesity due to excess calories: Secondary | ICD-10-CM | POA: Diagnosis not present

## 2023-04-05 DIAGNOSIS — M3502 Sicca syndrome with lung involvement: Secondary | ICD-10-CM | POA: Diagnosis not present

## 2023-04-05 DIAGNOSIS — Z6833 Body mass index (BMI) 33.0-33.9, adult: Secondary | ICD-10-CM

## 2023-04-05 LAB — C-REACTIVE PROTEIN: CRP: <1 mg/dL (ref 0.5–20.0)

## 2023-04-05 NOTE — Assessment & Plan Note (Signed)
 Agree that she is a candidate for Wegovy  or any other GLP-1 agonist treatment.  If she starts Wegovy , then she may proceed with her lung transplant that would prolong her life. Start semaglutide  (Wegovy ) for weight loss. Start by injecting 0.25 mg into the skin once weekly for 4 weeks, then increase to 0.5 mg once weekly thereafter.   Close follow-up in 2 months.

## 2023-04-05 NOTE — Patient Instructions (Signed)
 Start semaglutide  (Wegovy ) for weight loss. Start by injecting 0.25 mg into the skin once weekly for 4 weeks, then increase to 0.5 mg once weekly thereafter.  Please notify me once you have used your last 0.25 mg pen so that I can send the 0.5 mg dose to your pharmacy.  I will see you in April for follow-up.  It was a pleasure to see you today!

## 2023-04-05 NOTE — Assessment & Plan Note (Signed)
 Agree that she is an excellent candidate for Wegovy  or any GLP-1 agonist treatment.  She is waiting for the potential life-prolonging surgery.  Start semaglutide  (Wegovy ) for weight loss. Start by injecting 0.25 mg into the skin once weekly for 4 weeks, then increase to 0.5 mg once weekly thereafter.   Discussed potential side effects and instructions for use.  Will follow-up closely in 2 months for weight loss follow-up.

## 2023-04-05 NOTE — Progress Notes (Signed)
 Subjective:    Patient ID: Hannah Klein, female    DOB: [birth year], 48 y.o.   MRN: [id number]  HPI  Hannah Klein is a very pleasant 48 y.o. female with a significant medical history including hypertension, severe asthma, OSA, interstitial lung disease, Sjogren's syndrome with lung involvement, GERD, obesity, constipation, hyperlipidemia who presents today to discuss obesity.  She is requesting to start Wegovy  to assist with weight loss.  She is pending approval for lung transplant for progressing interstitial lung disease, chronic respiratory failure, antisynthetase syndrome.   Following with pulmonology through [facility], rheumatology through [facility], transplant team through [facility].  The recommendation is for weight loss in order to proceed with lung transplant. She is needing to weigh around 195-203. She has been on prednisone  for >1 year which has contributed to her weight gain.  She was prescribed Wegovy , however, her insurance required a face-to-face office visit for approval.  She is struggled with weight loss given her limited mobility with chronic respiratory failure.  She is oxygen  dependent. She is seeing a nutritional psychiatrist through group therapy. She's active on Weight Watchers now.   She struggles with mind hunger, will eat just because. The evenings are tougher. She will eat large portions with dinner, then will snack after dinner. She is trying to make healthier choices.   Wt Readings from Last 3 Encounters:  04/05/23 228 lb (103.4 kg)  04/02/23 227 lb 15.3 oz (103.4 kg)  03/14/23 227 lb 8.2 oz (103.2 kg)   Body mass index is 33.67 kg/m.   Review of Systems  Respiratory:  Positive for shortness of breath.   Cardiovascular:  Negative for chest pain.  Gastrointestinal:  Negative for constipation and diarrhea.         Past Medical History:  Diagnosis Date   Arthritis    Asthma    Environmental allergies    Esophageal dysphagia    Generalized anxiety  disorder    GERD (gastroesophageal reflux disease)    History of hiatal hernia    Hx of colonic polyps    Hypertension    Lung disorder    Patient states she has two unidentified ILD   Migraines    MRSA (methicillin resistant Staphylococcus aureus)    Lt upper arm  approx 8 years ago   Sleep apnea     Social History   Socioeconomic History   Marital status: Single    Spouse name: Not on file   Number of children: 0   Years of education: Not on file   Highest education level: Associate degree: academic program  Occupational History   Occupation: Administrative work  Tobacco Use   Smoking status: Former    Current packs/day: 0.00    Average packs/day: 2.0 packs/day for 18.0 years (36.1 ttl pk-yrs)    Types: Cigarettes    Start date: 13    Quit date: 03/16/2007    Years since quitting: 16.0    Passive exposure: Current   Smokeless tobacco: Never  Vaping Use   Vaping status: Never Used  Substance and Sexual Activity   Alcohol use: No    Alcohol/week: 0.0 standard drinks of alcohol   Drug use: No   Sexual activity: Not Currently  Other Topics Concern   Not on file  Social History Narrative   Single.   Moved from [place] .   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 Filing for disability   Social Drivers of Corporate Investment Banker Strain:  Low Risk  (04/03/2023)   Overall Financial Resource Strain (CARDIA)    Difficulty of Paying Living Expenses: Not hard at all  Food Insecurity: No Food Insecurity (04/03/2023)   Hunger Vital Sign    Worried About Running Out of Food in the Last Year: Never true    Ran Out of Food in the Last Year: Never true  Transportation Needs: No Transportation Needs (04/03/2023)   PRAPARE - Administrator, Civil Service (Medical): No    Lack of Transportation (Non-Medical): No  Physical Activity: Insufficiently Active (04/03/2023)   Exercise Vital Sign    Days of Exercise per Week: 3 days    Minutes of Exercise per Session: 20  min  Stress: No Stress Concern Present (04/03/2023)   [employer] of Occupational Health - Occupational Stress Questionnaire    Feeling of Stress : Not at all  Social Connections: Unknown (04/03/2023)   Social Connection and Isolation Panel [NHANES]    Frequency of Communication with Friends and Family: More than three times a week    Frequency of Social Gatherings with Friends and Family: Twice a week    Attends Religious Services: Patient declined    Database Administrator or Organizations: No    Attends Engineer, Structural: Not on file    Marital Status: Never married  Intimate Partner Violence: Not At Risk (01/26/2022)   Humiliation, Afraid, Rape, and Kick questionnaire    Fear of Current or Ex-Partner: No    Emotionally Abused: No    Physically Abused: No    Sexually Abused: No    Past Surgical History:  Procedure Laterality Date   CHOLECYSTECTOMY     DILATION AND CURETTAGE OF UTERUS     INTERCOSTAL NERVE BLOCK Right 03/08/2022   Procedure: INTERCOSTAL NERVE BLOCK;  Surgeon: Shyrl Linnie KIDD, MD;  Location: [facility] OR;  Service: Thoracic;  Laterality: Right;   LUNG BIOPSY  02/2022   NECK SURGERY     Fusion to C6 and C7   TYMPANOSTOMY TUBE PLACEMENT     WRIST ARTHROSCOPY WITH DEBRIDEMENT Left 02/27/2018   Procedure: WRIST ARTHROSCOPY WITH DEBRIDEMENT;  Surgeon: Murrell Drivers, MD;  Location: [facility];  Service: Orthopedics;  Laterality: Left;    Family History  Problem Relation Age of Onset   Eczema Mother    Allergic rhinitis Brother    Asthma Brother    Asthma Brother    Allergic rhinitis Brother    Urticaria Other    Angioedema Other    Breast cancer Neg Hx     Allergies  Allergen Reactions   Advair Diskus [Fluticasone -Salmeterol] Other (See Comments)    dries my throat out really bad'   Flonase  [Fluticasone ]     Nose bleeds   Lipitor [Atorvastatin Calcium ] Other (See Comments)    Elevations in blood pressure?    Current  Outpatient Medications on File Prior to Visit  Medication Sig Dispense Refill   acetaminophen  (TYLENOL ) 500 MG tablet Take 2 tablets (1,000 mg total) by mouth every 6 (six) hours as needed. 30 tablet 0   albuterol  (VENTOLIN  HFA) 108 (90 Base) MCG/ACT inhaler INHALE 1-2 PUFFS BY MOUTH EVERY 6 HOURS AS NEEDED FOR WHEEZE OR SHORTNESS OF BREATH 18 g 0   B Complex-C (B-COMPLEX WITH VITAMIN C) tablet Take 1 tablet by mouth daily.     Cholecalciferol (VITAMIN D3) 125 MCG (5000 UT) TABS Take 5,000 Units by mouth daily.     cycloSPORINE (RESTASIS) 0.05 % ophthalmic  emulsion Place 1 drop into both eyes 2 (two) times daily.     esomeprazole  (NEXIUM ) 40 MG capsule TAKE 1 CAPSULE (40 MG TOTAL) BY MOUTH 2 (TWO) TIMES DAILY. FOR HEARTBURN. 180 capsule 2   famotidine  (PEPCID ) 40 MG tablet Take 1 tablet (40 mg total) by mouth 2 (two) times daily. for heartburn. 180 tablet 2   fexofenadine (ALLEGRA) 180 MG tablet Take 180 mg by mouth daily.     hydrOXYzine  (VISTARIL ) 50 MG capsule TAKE 1 CAPSULE BY MOUTH ONCE OR TWICE DAILY AS NEEDED FOR ANXIETY 180 capsule 2   levocetirizine (XYZAL ) 5 MG tablet Take 1 tablet (5 mg total) by mouth 2 (two) times daily. For allergies 180 tablet 3   Multiple Vitamin (MULTIVITAMIN) capsule Take 1 capsule by mouth daily.     Omega-3 Fatty Acids (OMEGA-3 FISH OIL) 300 MG CAPS Take 1,000 mg by mouth.     ondansetron  (ZOFRAN ) 4 MG tablet Take 1 tablet (4 mg total) by mouth every 8 (eight) hours as needed for nausea or vomiting. 20 tablet 0   oxyCODONE -acetaminophen  (PERCOCET) 7.5-325 MG tablet Take 1 tablet by mouth every 6 (six) hours as needed for up to 20 doses for severe pain. 20 tablet 0   predniSONE  (DELTASONE ) 10 MG tablet TAKE 2 TABLETS BY MOUTH TWICE DAILY 120 tablet 3   predniSONE  (DELTASONE ) 5 MG tablet Take 1 tablet (5 mg total) by mouth daily with breakfast. 90 tablet 1   pregabalin (LYRICA) 25 MG capsule Take 25 mg by mouth 2 (two) times daily.     rosuvastatin  (CRESTOR ) 10  MG tablet TAKE 1 TABLET BY MOUTH EVERY DAY FOR CHOLESTEROL 90 tablet 2   sulfamethoxazole -trimethoprim  (BACTRIM ) 400-80 MG tablet Take 1 tablet by mouth daily.     vitamin B-12 (CYANOCOBALAMIN ) 1000 MCG tablet Take 1,000 mcg by mouth daily.     vitamin C (ASCORBIC ACID) 500 MG tablet Take 500 mg by mouth daily.     vitamin E 180 MG (400 UNITS) capsule Take 400 Units by mouth daily.     Azelastine  HCl 137 MCG/SPRAY SOLN PLACE 2 SPRAYS INTO BOTH NOSTRILS 2 (TWO) TIMES DAILY. USE IN EACH NOSTRIL AS DIRECTED (Patient not taking: Reported on 04/05/2023) 30 mL 5   budesonide  (PULMICORT ) 0.25 MG/2ML nebulizer solution Take 0.25 mg by nebulization 2 (two) times daily. (Patient not taking: Reported on 04/05/2023)     desonide (DESOWEN) 0.05 % cream Apply 1 Application topically daily as needed. (Patient not taking: Reported on 04/05/2023)     diclofenac  Sodium (VOLTAREN  ARTHRITIS PAIN) 1 % GEL Apply 2 g topically daily as needed (Chest pain). (Patient not taking: Reported on 04/05/2023)     econazole nitrate 1 % cream Apply 1 Application topically daily as needed. (Patient not taking: Reported on 04/05/2023)     formoterol  (PERFOROMIST ) 20 MCG/2ML nebulizer solution Take 20 mcg by nebulization 2 (two) times daily. (Patient not taking: Reported on 04/05/2023)     mometasone -formoterol  (DULERA) 200-5 MCG/ACT AERO Inhale 2 puffs into the lungs every 12 (twelve) hours. (Patient not taking: Reported on 04/05/2023) 13 g 5   neomycin-polymyxin b -dexamethasone  (MAXITROL) 3.5-10000-0.1 OINT Place 1 Application into both eyes at bedtime. (Patient not taking: Reported on 04/05/2023)     Semaglutide -Weight Management (WEGOVY ) 0.25 MG/0.5ML SOAJ Inject 0.25 mg into the skin once a week. (Patient not taking: Reported on 04/05/2023) 2 mL 0   sucralfate  (CARAFATE ) 1 g tablet Take 1 tablet (1 g total) by mouth in the morning, at  noon, and at bedtime. (Patient not taking: Reported on 04/05/2023) 90 tablet 5   No current facility-administered  medications on file prior to visit.    BP 132/72   Pulse 67   Temp 99.5 F (37.5 C) (Temporal)   Ht 5' 9 (1.753 m)   Wt 228 lb (103.4 kg)   LMP 09/28/2022   SpO2 98% Comment: 1 L o2  BMI 33.67 kg/m  Objective:   Physical Exam Cardiovascular:     Rate and Rhythm: Normal rate and regular rhythm.  Pulmonary:     Effort: Pulmonary effort is normal.     Breath sounds: Normal breath sounds.  Musculoskeletal:     Cervical back: Neck supple.  Skin:    General: Skin is warm and dry.  Neurological:     Mental Status: She is alert and oriented to person, place, and time.  Psychiatric:        Mood and Affect: Mood normal.           Assessment & Plan:  Class 1 obesity due to excess calories with serious comorbidity and body mass index (BMI) of 33.0 to 33.9 in adult Assessment & Plan: Agree that she is an excellent candidate for Wegovy  or any GLP-1 agonist treatment.  She is waiting for the potential life-prolonging surgery.  Start semaglutide  (Wegovy ) for weight loss. Start by injecting 0.25 mg into the skin once weekly for 4 weeks, then increase to 0.5 mg once weekly thereafter.   Discussed potential side effects and instructions for use.  Will follow-up closely in 2 months for weight loss follow-up.   ILD (interstitial lung disease) (HCC) Assessment & Plan: Agree that she is a candidate for Wegovy  or any other GLP-1 agonist treatment.  If she starts Wegovy , then she may proceed with her lung transplant that would prolong her life. Start semaglutide  (Wegovy ) for weight loss. Start by injecting 0.25 mg into the skin once weekly for 4 weeks, then increase to 0.5 mg once weekly thereafter.   Close follow-up in 2 months.         Bellarose Burtt K Socorro Ebron, NP

## 2023-04-09 ENCOUNTER — Telehealth: Payer: Self-pay

## 2023-04-09 ENCOUNTER — Other Ambulatory Visit
Admission: RE | Admit: 2023-04-09 | Discharge: 2023-04-09 | Disposition: A | Discharge status: Home / Self Care | Payer: Medicaid Other | Attending: Primary Care | Admitting: Primary Care

## 2023-04-09 ENCOUNTER — Other Ambulatory Visit (HOSPITAL_COMMUNITY): Payer: Self-pay

## 2023-04-09 ENCOUNTER — Encounter (HOSPITAL_COMMUNITY)
Admission: RE | Admit: 2023-04-09 | Discharge: 2023-04-09 | Disposition: A | Discharge status: Home / Self Care | Payer: Medicaid Other | Source: Ambulatory Visit | Attending: Pulmonary Disease

## 2023-04-09 ENCOUNTER — Other Ambulatory Visit: Payer: Self-pay | Admitting: Primary Care

## 2023-04-09 ENCOUNTER — Other Ambulatory Visit
Admission: RE | Admit: 2023-04-09 | Discharge: 2023-04-09 | Disposition: A | Discharge status: Home / Self Care | Payer: Self-pay | Source: Ambulatory Visit | Attending: Medical Genetics | Admitting: Medical Genetics

## 2023-04-09 ENCOUNTER — Other Ambulatory Visit (HOSPITAL_COMMUNITY): Payer: Medicaid Other

## 2023-04-09 DIAGNOSIS — M3502 Sicca syndrome with lung involvement: Secondary | ICD-10-CM

## 2023-04-09 DIAGNOSIS — J849 Interstitial pulmonary disease, unspecified: Secondary | ICD-10-CM

## 2023-04-09 DIAGNOSIS — Z79899 Other long term (current) drug therapy: Secondary | ICD-10-CM | POA: Diagnosis not present

## 2023-04-09 LAB — COMPREHENSIVE METABOLIC PANEL
ALT: 102 U/L — ABNORMAL HIGH (ref 0–44)
AST: 71 U/L — ABNORMAL HIGH (ref 15–41)
Albumin: 4.7 g/dL (ref 3.5–5.0)
Alkaline Phosphatase: 44 U/L (ref 38–126)
Anion gap: 10 (ref 5–15)
BUN: 24 mg/dL — ABNORMAL HIGH (ref 6–20)
CO2: 28 mmol/L (ref 22–32)
Calcium: 9.8 mg/dL (ref 8.9–10.3)
Chloride: 101 mmol/L (ref 98–111)
Creatinine, Ser: 0.9 mg/dL (ref 0.44–1.00)
GFR, Estimated: >60 mL/min (ref >60)
Glucose, Bld: 104 mg/dL — ABNORMAL HIGH (ref 70–99)
Potassium: 4.6 mmol/L (ref 3.5–5.1)
Sodium: 139 mmol/L (ref 135–145)
Total Bilirubin: 1.1 mg/dL (ref 0.0–1.2)
Total Protein: 7.6 g/dL (ref 6.5–8.1)

## 2023-04-09 LAB — CBC WITH DIFFERENTIAL/PLATELET
Abs Immature Granulocytes: 0.03 10*3/uL (ref 0.00–0.07)
Basophils Absolute: 0 10*3/uL (ref 0.0–0.1)
Basophils Relative: 0 %
Eosinophils Absolute: 0 10*3/uL (ref 0.0–0.5)
Eosinophils Relative: 1 %
HCT: 38.1 % (ref 36.0–46.0)
Hemoglobin: 12.4 g/dL (ref 12.0–15.0)
Immature Granulocytes: 0 %
Lymphocytes Relative: 8 %
Lymphs Abs: 0.6 10*3/uL — ABNORMAL LOW (ref 0.7–4.0)
MCH: 31.1 pg (ref 26.0–34.0)
MCHC: 32.5 g/dL (ref 30.0–36.0)
MCV: 95.5 fL (ref 80.0–100.0)
Monocytes Absolute: 0.5 10*3/uL (ref 0.1–1.0)
Monocytes Relative: 6 %
Neutro Abs: 6.2 10*3/uL (ref 1.7–7.7)
Neutrophils Relative %: 85 %
Platelets: 204 10*3/uL (ref 150–400)
RBC: 3.99 MIL/uL (ref 3.87–5.11)
RDW: 14.3 % (ref 11.5–15.5)
WBC: 7.3 10*3/uL (ref 4.0–10.5)
nRBC: 0 % (ref 0.0–0.2)

## 2023-04-09 NOTE — Telephone Encounter (Signed)
PA request has been Submitted. New Encounter created for follow up. For additional info see Pharmacy Prior Auth telephone encounter from 04/09/2023.

## 2023-04-09 NOTE — Progress Notes (Signed)
Daily Session Note  Patient Details  Name: Hannah Klein MRN: [id number] Date of Birth: 30-Jul-1975 Referring Provider:   Doristine Devoid Pulmonary Rehab Walk Test from 08/15/2022 in [facility]  Referring Provider Soskis  [sees [name]]       Encounter Date: 04/09/2023  Check In:  Session Check In - 04/09/23 7829       Check-In   Supervising physician immediately available to respond to emergencies [facility] MD immediately available    Physician(s) Hoover Browns, NP    Location [facility]    Staff Present Durel Salts, Zella Richer, MS, ACSM-CEP, Exercise Physiologist;Randi Dionisio Paschal, ACSM-CEP, Exercise Physiologist    Virtual Visit No    Medication changes reported     No    Fall or balance concerns reported    No    Tobacco Cessation No Change    Warm-up and Cool-down Performed as group-led instruction    Resistance Training Performed Yes    VAD Patient? No    PAD/SET Patient? No      Pain Assessment   Currently in Pain? No/denies    Multiple Pain Sites No             Capillary Blood Glucose: No results found for this or any previous visit (from the past 24 hours).    Social History   Tobacco Use  Smoking Status Former   Current packs/day: 0.00   Average packs/day: 2.0 packs/day for 18.0 years (36.1 ttl pk-yrs)   Types: Cigarettes   Start date: 89   Quit date: 03/16/2007   Years since quitting: 16.0   Passive exposure: Current  Smokeless Tobacco Never    Goals Met:  Proper associated with RPD/PD & O2 Sat Independence with exercise equipment Exercise tolerated well No report of concerns or symptoms today Strength training completed today  Goals Unmet:  Not Applicable  Comments: Service time is from 0806 to 0910.    Dr. Mechele Collin is Medical Director for Pulmonary Rehab at [facility]

## 2023-04-09 NOTE — Telephone Encounter (Signed)
Pharmacy Patient Advocate Encounter   Received notification from Pt Calls that prior authorization for Wegovy 0.25MG /0.5ML auto-injectors is required/requested.   Insurance verification completed.   The patient is insured through [facility] .   Per test claim: PA required; PA submitted to above mentioned insurance via CoverMyMeds Key/confirmation #/EOC [id number] Status is pending

## 2023-04-09 NOTE — Telephone Encounter (Signed)
Patient was seen on 04/05/23 for visit to attempt to have [facility] resubmitted for approval. Please work on this urgently.

## 2023-04-09 NOTE — Telephone Encounter (Signed)
Pharmacy Patient Advocate Encounter  Received notification from [facility] that Prior Authorization for Wegovy 0.25MG /0.5ML auto-injectorshas been APPROVED from 04/09/2023 to 10/07/2023. Ran test claim, Copay is $4.00. This test claim was processed through [facility]- copay amounts may vary at other pharmacies due to pharmacy/plan contracts, or as the patient moves through the different stages of their insurance plan.   PA #/Case ID/Reference #:  [id number]

## 2023-04-10 ENCOUNTER — Ambulatory Visit
Admission: RE | Admit: 2023-04-10 | Discharge: 2023-04-10 | Disposition: A | Discharge status: Home / Self Care | Payer: Medicaid Other | Source: Ambulatory Visit | Attending: Pulmonary Disease

## 2023-04-10 DIAGNOSIS — J8410 Pulmonary fibrosis, unspecified: Secondary | ICD-10-CM | POA: Diagnosis not present

## 2023-04-10 DIAGNOSIS — F54 Psychological and behavioral factors associated with disorders or diseases classified elsewhere: Secondary | ICD-10-CM | POA: Diagnosis not present

## 2023-04-10 DIAGNOSIS — Z7682 Awaiting organ transplant status: Secondary | ICD-10-CM | POA: Diagnosis not present

## 2023-04-10 DIAGNOSIS — J849 Interstitial pulmonary disease, unspecified: Secondary | ICD-10-CM

## 2023-04-11 ENCOUNTER — Encounter (HOSPITAL_COMMUNITY)
Admission: RE | Admit: 2023-04-11 | Discharge: 2023-04-11 | Disposition: A | Discharge status: Home / Self Care | Payer: Medicaid Other | Source: Ambulatory Visit | Attending: Pulmonary Disease

## 2023-04-11 DIAGNOSIS — J849 Interstitial pulmonary disease, unspecified: Secondary | ICD-10-CM | POA: Diagnosis not present

## 2023-04-11 DIAGNOSIS — G4733 Obstructive sleep apnea (adult) (pediatric): Secondary | ICD-10-CM | POA: Diagnosis not present

## 2023-04-11 NOTE — Progress Notes (Signed)
Daily Session Note  Patient Details  Name: Hannah Klein MRN: [id number] Date of Birth: 02-Jul-1975 Referring Provider:   Doristine Devoid Pulmonary Rehab Walk Test from 08/15/2022 in [facility]  Referring Provider Soskis  [sees [name]]       Encounter Date: 04/11/2023  Check In:  Session Check In - 04/11/23 0834       Check-In   Supervising physician immediately available to respond to emergencies [facility] MD immediately available    Physician(s) Soledad Gerlach, NP    Location [facility]    Staff Present Durel Salts, Zella Richer, MS, ACSM-CEP, Exercise Physiologist;Mary Gerre Scull, RN, BSN    Virtual Visit No    Medication changes reported     No    Fall or balance concerns reported    No    Tobacco Cessation No Change    Warm-up and Cool-down Performed as group-led instruction    Resistance Training Performed Yes    VAD Patient? No    PAD/SET Patient? No      Pain Assessment   Currently in Pain? No/denies    Pain Score 0-No pain             Capillary Blood Glucose: No results found for this or any previous visit (from the past 24 hours).    Social History   Tobacco Use  Smoking Status Former   Current packs/day: 0.00   Average packs/day: 2.0 packs/day for 18.0 years (36.1 ttl pk-yrs)   Types: Cigarettes   Start date: 30   Quit date: 03/16/2007   Years since quitting: 16.0   Passive exposure: Current  Smokeless Tobacco Never    Goals Met:  Proper associated with RPD/PD & O2 Sat Independence with exercise equipment Exercise tolerated well No report of concerns or symptoms today Strength training completed today  Goals Unmet:  Not Applicable  Comments: Service time is from 0814 to 0932.    Dr. Mechele Collin is Medical Director for Pulmonary Rehab at [facility].

## 2023-04-12 NOTE — Progress Notes (Signed)
Discharge Progress Report  Patient Details  Name: Hannah Klein MRN: [id number] Date of Birth: Oct 05, 1975 Referring Provider:   Doristine Devoid Pulmonary Rehab Walk Test from 08/15/2022 in [facility]  Referring Provider Soskis  [sees [name]]        Number of Visits: 79  Reason for Discharge:  Patient has met program and personal goals.  Smoking History:  Social History   Tobacco Use  Smoking Status Former   Current packs/day: 0.00   Average packs/day: 2.0 packs/day for 18.0 years (36.1 ttl pk-yrs)   Types: Cigarettes   Start date: 47   Quit date: 03/16/2007   Years since quitting: 16.0   Passive exposure: Current  Smokeless Tobacco Never    Diagnosis:  Interstitial lung disease (HCC)  ADL UCSD:  Pulmonary Assessment Scores     Row Name 04/04/23 [phone] 04/11/23 0954       ADL UCSD   ADL Phase Exit --    SOB Score total 67 --      CAT Score   CAT Score 17 --      mMRC Score   mMRC Score -- 4             Initial Exercise Prescription:   Discharge Exercise Prescription (Final Exercise Prescription Changes):  Exercise Prescription Changes - 04/02/23 0900       Response to Exercise   Blood Pressure (Admit) 112/70    Blood Pressure (Exercise) 160/70    Blood Pressure (Exit) 110/66    Heart Rate (Admit) 70 bpm    Heart Rate (Exercise) 102 bpm    Heart Rate (Exit) 79 bpm    Oxygen Saturation (Admit) 100 %    Oxygen Saturation (Exercise) 91 %    Oxygen Saturation (Exit) 94 %    Rating of Perceived Exertion (Exercise) 12    Perceived Dyspnea (Exercise) 2    Duration Continue with 30 min of aerobic exercise without signs/symptoms of physical distress.    Intensity THRR unchanged      Progression   Progression Continue to progress workloads to maintain intensity without signs/symptoms of physical distress.    Average METs 3.3      Resistance Training   Training Prescription Yes     Weight black bands    Reps 10-15    Time 10 Minutes      Interval Training   Interval Training Yes    Equipment Treadmill    Comments 1 min @ 3.4 mph and 1.5% incline, 1 min @ 2.2 mph and 0% incline.      Oxygen   Oxygen Continuous    Liters 1-6      Recumbant Elliptical   Level 6    Minutes 15    METs 3.3      Oxygen   Maintain Oxygen Saturation 88% or higher             Functional Capacity:  6 Minute Walk     Row Name 04/11/23 0950         6 Minute Walk   Phase Discharge     Distance 1560 feet     Distance % Change 33.33 %     Distance Feet Change 390 ft     Walk Time 6 minutes     # of Rest Breaks 0     MPH 2.95     METS 4.59     RPE 12  Perceived Dyspnea  1     VO2 Peak 16.08     Symptoms No     Resting HR 75 bpm     Resting BP 120/60     Resting Oxygen Saturation  98 %     Exercise Oxygen Saturation  during 6 min walk 87 %     Max Ex. HR 109 bpm     Max Ex. BP 134/60     2 Minute Post BP 126/64       Interval HR   1 Minute HR 94     2 Minute HR 100     3 Minute HR 91     4 Minute HR 103     5 Minute HR 105     6 Minute HR 109     2 Minute Post HR 78     Interval Heart Rate? Yes       Interval Oxygen   Interval Oxygen? Yes     Baseline Oxygen Saturation % 98 %     1 Minute Oxygen Saturation % 98 %     1 Minute Liters of Oxygen 4 L     2 Minute Oxygen Saturation % 95 %     2 Minute Liters of Oxygen 4 L     3 Minute Oxygen Saturation % 90 %  2:40 87%     3 Minute Liters of Oxygen 4 L  increased 6L     4 Minute Oxygen Saturation % 93 %     4 Minute Liters of Oxygen 4 L     5 Minute Oxygen Saturation % 93 %  4:42 87     5 Minute Liters of Oxygen 105 L     6 Minute Oxygen Saturation % 97 %     6 Minute Liters of Oxygen 109 L     2 Minute Post Oxygen Saturation % 100 %     2 Minute Post Liters of Oxygen 78 L              Psychological, QOL, Others - Outcomes: PHQ 2/9:    04/05/2023    9:14 AM 04/04/2023    8:24 AM 08/15/2022     9:28 AM 08/14/2022    8:07 AM 06/09/2021    9:09 AM  Depression screen PHQ 2/9  Decreased Interest 0 0 1 1 1   Down, Depressed, Hopeless 0 0 0 0 0  PHQ - 2 Score 0 0 1 1 1   Altered sleeping 0 1 0  1  Tired, decreased energy 0 1 0  1  Change in appetite 0 0 1  0  Feeling bad or failure about yourself  0 0 0  0  Trouble concentrating 0 0 0  0  Moving slowly or fidgety/restless 0 0 0  0  Suicidal thoughts 0 0 0  0  PHQ-9 Score 0 2 2  3   Difficult doing work/chores Not difficult at all Not difficult at all Not difficult at all  Not difficult at all    Quality of Life:   Personal Goals: Goals established at orientation with interventions provided to work toward goal.    Personal Goals Discharge:  Goals and Risk Factor Review     Row Name 10/22/22 1512 11/19/22 0915 12/17/22 1124 01/14/23 0955 02/08/23 1424     Core Components/Risk Factors/Patient Goals Review   Personal Goals Review Weight Management/Obesity;Improve shortness of breath with ADL's Weight Management/Obesity;Improve shortness of breath with ADL's Weight Management/Obesity;Improve  shortness of breath with ADL's Weight Management/Obesity;Improve shortness of breath with ADL's Weight Management/Obesity;Improve shortness of breath with ADL's   Review Goal in progress to lose weight and make healthy choices. Hannah Klein is working with our dietician on healthy eating habits and less snacking. Per [facility] lung transplant team recommendations, Hannah Klein will need to lose weight before surgery. Our dietician has recently suggested a low calorie 14-day plan. Hannah Klein was excited to see that she lost weight last week. Hopefully this will jump start her weight loss journey and give her the confidence that she can lose weight. Goal in progress on improving her shortness of breath with ADLs. Hannah Klein is maintaining her oxygen saturation of 88% or higher on 8-10L with exertion. She is able to titrate down to 2L at rest. Hannah Klein will continue to benefit  from PR for nutrition, education, exercise, and lifestyle modification. Goal in progress to lose weight. Hannah Klein is working with our dietician on healthy eating habits and less snacking. Per [facility] lung transplant team recommendations, Hannah Klein will need to lose weight before surgery. She is currently down ~8#. Goal in progress on improving her shortness of breath with ADLs. Hannah Klein is maintaining her oxygen saturation of 88% or higher on 8-10L with exertion. She has increased both his workload and METs while maintain on her current oxygen prescription. Hannah Klein will continue to benefit from PR for nutrition, education, exercise, and lifestyle modification. Goal in progress to lose weight. Hannah Klein is working with our dietician to achieve her weight loss goals. Per [facility] lung transplant team recommendations, Hannah Klein will need to lose weight before surgery. She is currently down 10#. Goal in progress on improving her shortness of breath with ADLs. Hannah Klein is maintaining her oxygen saturation of 88% or higher on 8-10L with exertion. She has increased both his workload and METs while maintain on her current oxygen prescription. Hannah Klein will continue to benefit from PR for nutrition, education, exercise, and lifestyle modification. Goal in progress to lose weight. Hannah Klein is working with our dietician to achieve her weight loss goals. She is currently down 18# from her highest weigh since starting the program. Goal in progress on improving her shortness of breath with ADLs. Hannah Klein is maintaining her oxygen saturation of 88% or higher on 6-8L with exertion. She has increased both his workload and METs while maintaining on her current oxygen prescription. Hannah Klein will continue to benefit from PR for nutrition, education, exercise, and lifestyle modification. Monthly review of patient's Core Components/Risk Factors/Patient Goals are as follows: Goal is progressing for weight loss. Hannah Klein has lost ~11.6# since starting the  program. Weight loss is a goal for Hannah Klein because she must meet criteria for a possible lung transplant. She worked with out dietician to formulate a plan that works for her. She is happy with the progress so far. Goal progressing on improving [name]'s shortness of breath with ADLs. Hannah Klein states since starting the program she went from "couch potato" to someone who enjoys exercise. She has completed 20 weeks of exercise with [name] so far. She has been able to wean down her oxygen with exertion, improve her METs, and her workload. Hannah Klein enjoys practicing interval training on the treadmill. We will continue to follow along with [facility] and re-evaluate her goals monthly. Hannah Klein will continue to benefit from participation in PR for nutrition, education, exercise, and lifestyle modification.   Expected Outcomes For [name] to lose weight and improve her shortness of breath with ADLs For [name] to lose weight and improve her shortness of breath  with ADLs For [name] to lose weight and improve her shortness of breath with ADLs For [name] to lose weight and improve her shortness of breath with ADLs For [name] to lose weight and improve her shortness of breath with ADLs    Row Name 03/18/23 1610 04/12/23 0853           Core Components/Risk Factors/Patient Goals Review   Personal Goals Review Weight Management/Obesity;Improve shortness of breath with ADL's Weight Management/Obesity;Improve shortness of breath with ADL's      Review Monthly review of patient's Core Components/Risk Factors/Patient Goals are as follows: Goal is progressing for weight loss. Hannah Klein has lost ~7.9# since starting the program. She worked with our dietician to formulate a plan that works for her. Her weight has been up and down during the program. Goal progressing on improving shortness of breath with ADLs. She has completed 51 sessions and has been able to improve her METs, and her workload. Hannah Klein enjoys practicing interval  training on the treadmill. We will continue to follow along with [facility] and re-evaluate her goals monthly. Hannah Klein will continue to benefit from participation in PR for nutrition, education, exercise, and lifestyle modification. Hannah Klein graduated from the Pulmonary rehab program on 04/11/23. Pt maintained good attendance and progressed during her participation in rehab as evidenced by increased METs and workload. Goal for weight loss was not met. Chrity's goal for weight loss was 2#'s per week. Goal met for improving shortness of breath with ADL's. Her SOB scale decreased from 67 to 65. Hannah Klein was a pleasure to have in the program. We wish her the best.      Expected Outcomes For [name] to lose weight and improve her shortness of breath with ADLs --               Exercise Goals and Review:  Exercise Goals     Row Name 10/25/22 1617             Exercise Goals   Increase Physical Activity Yes       Intervention Provide advice, education, support and counseling about physical activity/exercise needs.;Develop an individualized exercise prescription for aerobic and resistive training based on initial evaluation findings, risk stratification, comorbidities and participant's personal goals.       Expected Outcomes Short Term: Attend rehab on a regular basis to increase amount of physical activity.;Long Term: Exercising regularly at least 3-5 days a week.;Long Term: Add in home exercise to make exercise part of routine and to increase amount of physical activity.       Increase Strength and Stamina Yes       Intervention Provide advice, education, support and counseling about physical activity/exercise needs.;Develop an individualized exercise prescription for aerobic and resistive training based on initial evaluation findings, risk stratification, comorbidities and participant's personal goals.       Expected Outcomes Short Term: Increase workloads from initial exercise prescription for  resistance, speed, and METs.;Short Term: Perform resistance training exercises routinely during rehab and add in resistance training at home;Long Term: Improve cardiorespiratory fitness, muscular endurance and strength as measured by increased METs and functional capacity ( )       Able to understand and use rate of perceived exertion (RPE) scale Yes       Intervention Provide education and explanation on how to use RPE scale       Expected Outcomes Short Term: Able to use RPE daily in rehab to express subjective intensity level;Long Term:  Able to use RPE to guide  intensity level when exercising independently       Able to understand and use Dyspnea scale Yes       Intervention Provide education and explanation on how to use Dyspnea scale       Expected Outcomes Short Term: Able to use Dyspnea scale daily in rehab to express subjective sense of shortness of breath during exertion;Long Term: Able to use Dyspnea scale to guide intensity level when exercising independently       Knowledge and understanding of Target Heart Rate Range (THRR) Yes       Intervention Provide education and explanation of THRR including how the numbers were predicted and where they are located for reference       Expected Outcomes Short Term: Able to state/look up THRR;Long Term: Able to use THRR to govern intensity when exercising independently;Short Term: Able to use daily as guideline for intensity in rehab       Understanding of Exercise Prescription Yes       Intervention Provide education, explanation, and written materials on patient's individual exercise prescription       Expected Outcomes Short Term: Able to explain program exercise prescription;Long Term: Able to explain home exercise prescription to exercise independently                Exercise Goals Re-Evaluation:  Exercise Goals Re-Evaluation     Row Name 10/25/22 1618 11/20/22 1629 12/19/22 1029 01/16/23 1036 02/07/23 1602     Exercise Goal  Re-Evaluation   Exercise Goals Review Increase Physical Activity;Able to understand and use Dyspnea scale;Understanding of Exercise Prescription;Increase Strength and Stamina;Knowledge and understanding of Target Heart Rate Range (THRR);Able to understand and use rate of perceived exertion (RPE) scale Increase Physical Activity;Able to understand and use Dyspnea scale;Understanding of Exercise Prescription;Increase Strength and Stamina;Knowledge and understanding of Target Heart Rate Range (THRR);Able to understand and use rate of perceived exertion (RPE) scale Increase Physical Activity;Able to understand and use Dyspnea scale;Understanding of Exercise Prescription;Increase Strength and Stamina;Knowledge and understanding of Target Heart Rate Range (THRR);Able to understand and use rate of perceived exertion (RPE) scale Increase Physical Activity;Able to understand and use Dyspnea scale;Understanding of Exercise Prescription;Increase Strength and Stamina;Knowledge and understanding of Target Heart Rate Range (THRR);Able to understand and use rate of perceived exertion (RPE) scale Increase Physical Activity;Able to understand and use Dyspnea scale;Understanding of Exercise Prescription;Increase Strength and Stamina;Knowledge and understanding of Target Heart Rate Range (THRR);Able to understand and use rate of perceived exertion (RPE) scale   Comments Hannah Klein has completed 16 exercise sessions. She exercises for 15 min on the recumbent elliptical and treadmill. She averages 3.0 METs at level 4 on the recumbent elliptical and 3.1 METs at 2.5 mph and 1.5% incline. She performs the warmup and cooldown standing without limitations. Hannah Klein is still progressing her workloads steadily. She is determined to reach her full functional capacity in preparation for the [facility] lung transplant program. Hannah Klein has starting walking around her neighborhood on a regular basis. Will continue to monitor and progress as able. Hannah Klein  has completed 21 exercise sessions. She exercises for 15 min on the recumbent elliptical and treadmill. She averages 3.0 METs at level 4 on the recumbent elliptical and 3.3 METs at 2.5 mph and 2% incline. She performs the warmup and cooldown standing without limitations. Hannah Klein is still trying to progress workloads. She increased her workload slightly on the treadmill. I have asked for an update on her home exercise regimen. Hannah Klein is walking out home on a regular  basis. Will continue to monitor and progress as able. Hannah Klein has completed 27 exercise sessions. She exercises for 15 min on the recumbent elliptical and treadmill. She averages 3.6 METs at level 4 on the recumbent elliptical and 3.4 METs at 2.5 mph and 2% incline. She performs the warmup and cooldown standing without limitations. Hannah Klein has not increased her workload for either exercise mode although METs have increased on the recumbent elliptical. Hannah Klein is also progressing in her home exercise as her time has increased. Will continue to monitor and progress as able. Hannah Klein has completed 35 exercise sessions. She exercises for 15 min on the recumbent elliptical and treadmill. She averages 3.7 METs at level 5 on the recumbent elliptical and 3.7 METs at 3.2 mph and 1.5% incline. She performs the warmup and cooldown standing without limitations. Hannah Klein has increased her workload for both exercise modes as METs have slightly increased. Hannah Klein has also progressed to interval training. She tolerates interval training well. Will continue to monitor and progress as able. Hannah Klein has completed 41 exercise sessions. She exercises for 15 min on the recumbent elliptical and treadmill. She averages 3.5 METs at level 5 on the recumbent elliptical and 2.8-3.5 METs at 3.2 mph and 1.5% incline and 2.2 mph. Will change interval training to increase higher intensity time and decrease lower intensity time. Hannah Klein performs the warmup and cooldown standing without  limitations. Will continue to monitor and progress as able.   Expected Outcomes Through exercise at rehab and home, the patient will decrease shortness of breath with daily activities and feel confident in carrying out an exercise regimen at home. Through exercise at rehab and home, the patient will decrease shortness of breath with daily activities and feel confident in carrying out an exercise regimen at home. Through exercise at rehab and home, the patient will decrease shortness of breath with daily activities and feel confident in carrying out an exercise regimen at home. Through exercise at rehab and home, the patient will decrease shortness of breath with daily activities and feel confident in carrying out an exercise regimen at home. Through exercise at rehab and home, the patient will decrease shortness of breath with daily activities and feel confident in carrying out an exercise regimen at home.    Row Name 03/11/23 0851 04/11/23 1559           Exercise Goal Re-Evaluation   Exercise Goals Review Increase Physical Activity;Able to understand and use Dyspnea scale;Understanding of Exercise Prescription;Increase Strength and Stamina;Knowledge and understanding of Target Heart Rate Range (THRR);Able to understand and use rate of perceived exertion (RPE) scale Increase Physical Activity;Able to understand and use Dyspnea scale;Understanding of Exercise Prescription;Increase Strength and Stamina;Knowledge and understanding of Target Heart Rate Range (THRR);Able to understand and use rate of perceived exertion (RPE) scale      Comments Hannah Klein has completed 49 exercise sessions. She exercises for 15 min on the recumbent elliptical and treadmill. She averages 3.2 METs at level 6 on the recumbent elliptical and 2.5-4.2 METs at 2.2 mph and 3.4 mph with 1.5% incline. Interval training has been increased as METs have slightly increased. Hannah Klein performs the warmup and cooldown standing without limitations.  We have recently discussed home exercise again as Hannah Klein has mentioned buying a treadmill. She plans to use her treadmill at least 3x per week. I am confident in [place] exercising at home. Will continue to monitor and progress as able. Hannah Klein has completed 58 exercise sessions. Her peak METs were 3.6 on the recumbent elliptical  and 4.2 on the treadmill. [name] plans to start exercising at her local [employer]. I am confident in her carrying out an exercise regimen outside of rehab.      Expected Outcomes Through exercise at rehab and home, the patient will decrease shortness of breath with daily activities and feel confident in carrying out an exercise regimen at home. Through exercise at rehab and home, the patient will decrease shortness of breath with daily activities and feel confident in carrying out an exercise regimen at home.               Nutrition & Weight - Outcomes:    Nutrition:  Nutrition Therapy & Goals - 01/18/23 1541       Nutrition Therapy   Diet heart healthy diet    Drug/Food Interactions Statins/Certain Fruits      Personal Nutrition Goals   Nutrition Goal Patient to improve diet quality by using the plate method as a guide for meal planning to include lean protein/plant protein, fruits, vegetables, whole grains, nonfat dairy as part of a well-balanced diet.   goal in progress.   Personal Goal #2 Patient to identify strategies for weight loss of 0.5-2.0# per week.   goal in progress.   Personal Goal #3 Patient to identify food sources and limit daily intake of saturated fat, tran fat, sodium, and refined carbohdyrates.   goal in progress.   Comments Goals in action. We have discussed multiple strategies for weight loss including increasing low calorie dense foods, protein supplements, the plate method as a guide for meal planning, mindful eating strategies, and tracking intake. She was not successful with weight loss with these methods; recommended starting a Very Low  Calorie Diet rich in protein and non-starchy vegetables for 30+ days to aid with creating consistent calorie deficit  Since then, she is down 18.5# from her highest weight with our program (109.8kg).  Hannah Klein is currently working toward lung transplant through [place]. Per documenation with [facility] transplant dietitian, 6/13 and 9/26, she is working toward an initial weight loss goal of 203# and ultimatley a BMI of 27-28. She has maintained her weight over the last ~30 days. She will follow-up with [facility] transplant team 12/26. She reports that multiple weight loss drugs including wegovy have been denied by her insurance company. Patient will benefit from participation in pulmonary rehab for nutrition, exercise, and lifestyle modification.      Intervention Plan   Intervention Prescribe, educate and counsel regarding individualized specific dietary modifications aiming towards targeted core components such as weight, hypertension, lipid management, diabetes, heart failure and other comorbidities.;Nutrition handout(s) given to patient.    Expected Outcomes Short Term Goal: Understand basic principles of dietary content, such as calories, fat, sodium, cholesterol and nutrients.;Long Term Goal: Adherence to prescribed nutrition plan.;Short Term Goal: A plan has been developed with personal nutrition goals set during dietitian appointment.             Nutrition Discharge:  Nutrition Assessments - 04/04/23 0822       Rate Your Plate Scores   Post Score 68             Education Questionnaire Score:  Knowledge Questionnaire Score - 04/04/23 1478       Knowledge Questionnaire Score   Post Score 17/18             Goals reviewed with patient; copy given to patient.

## 2023-04-20 LAB — GENECONNECT MOLECULAR SCREEN: Genetic Analysis Overall Interpretation: NEGATIVE

## 2023-04-24 ENCOUNTER — Encounter: Payer: Self-pay | Admitting: Pulmonary Disease

## 2023-04-24 DIAGNOSIS — Z7682 Awaiting organ transplant status: Secondary | ICD-10-CM | POA: Diagnosis not present

## 2023-04-24 DIAGNOSIS — F54 Psychological and behavioral factors associated with disorders or diseases classified elsewhere: Secondary | ICD-10-CM | POA: Diagnosis not present

## 2023-04-25 DIAGNOSIS — D8989 Other specified disorders involving the immune mechanism, not elsewhere classified: Secondary | ICD-10-CM | POA: Diagnosis not present

## 2023-04-25 DIAGNOSIS — J8410 Pulmonary fibrosis, unspecified: Secondary | ICD-10-CM | POA: Diagnosis not present

## 2023-04-26 DIAGNOSIS — G4733 Obstructive sleep apnea (adult) (pediatric): Secondary | ICD-10-CM | POA: Diagnosis not present

## 2023-04-26 DIAGNOSIS — J849 Interstitial pulmonary disease, unspecified: Secondary | ICD-10-CM | POA: Diagnosis not present

## 2023-04-29 ENCOUNTER — Other Ambulatory Visit: Payer: Self-pay | Admitting: Primary Care

## 2023-04-29 DIAGNOSIS — F411 Generalized anxiety disorder: Secondary | ICD-10-CM

## 2023-05-01 ENCOUNTER — Other Ambulatory Visit: Payer: Self-pay | Admitting: Primary Care

## 2023-05-01 DIAGNOSIS — F54 Psychological and behavioral factors associated with disorders or diseases classified elsewhere: Secondary | ICD-10-CM | POA: Diagnosis not present

## 2023-05-01 DIAGNOSIS — K219 Gastro-esophageal reflux disease without esophagitis: Secondary | ICD-10-CM

## 2023-05-01 DIAGNOSIS — Z7682 Awaiting organ transplant status: Secondary | ICD-10-CM | POA: Diagnosis not present

## 2023-05-01 DIAGNOSIS — E785 Hyperlipidemia, unspecified: Secondary | ICD-10-CM

## 2023-05-05 DIAGNOSIS — J849 Interstitial pulmonary disease, unspecified: Secondary | ICD-10-CM

## 2023-05-05 DIAGNOSIS — G4733 Obstructive sleep apnea (adult) (pediatric): Secondary | ICD-10-CM

## 2023-05-05 DIAGNOSIS — I10 Essential (primary) hypertension: Secondary | ICD-10-CM

## 2023-05-05 DIAGNOSIS — M3502 Sicca syndrome with lung involvement: Secondary | ICD-10-CM

## 2023-05-05 DIAGNOSIS — E785 Hyperlipidemia, unspecified: Secondary | ICD-10-CM

## 2023-05-05 DIAGNOSIS — E66811 Obesity, class 1: Secondary | ICD-10-CM

## 2023-05-06 MED ORDER — SEMAGLUTIDE-WEIGHT MANAGEMENT 0.5 MG/0.5ML ~~LOC~~ SOAJ: 0.5000 mg | SUBCUTANEOUS | 0 refills | Status: DC

## 2023-05-07 ENCOUNTER — Other Ambulatory Visit (INDEPENDENT_AMBULATORY_CARE_PROVIDER_SITE_OTHER): Payer: Medicaid Other

## 2023-05-07 DIAGNOSIS — J849 Interstitial pulmonary disease, unspecified: Secondary | ICD-10-CM

## 2023-05-07 DIAGNOSIS — M3502 Sicca syndrome with lung involvement: Secondary | ICD-10-CM | POA: Diagnosis not present

## 2023-05-07 DIAGNOSIS — Z79899 Other long term (current) drug therapy: Secondary | ICD-10-CM

## 2023-05-07 LAB — CBC WITH DIFFERENTIAL/PLATELET
Basophils Absolute: 0 10*3/uL (ref 0.0–0.1)
Basophils Relative: 0.5 % (ref 0.0–3.0)
Eosinophils Absolute: 0.1 10*3/uL (ref 0.0–0.7)
Eosinophils Relative: 2.9 % (ref 0.0–5.0)
HCT: 39.5 % (ref 36.0–46.0)
Hemoglobin: 13.1 g/dL (ref 12.0–15.0)
Lymphocytes Relative: 18.1 % (ref 12.0–46.0)
Lymphs Abs: 0.8 10*3/uL (ref 0.7–4.0)
MCHC: 33.2 g/dL (ref 30.0–36.0)
MCV: 97.2 fl (ref 78.0–100.0)
Monocytes Absolute: 0.5 10*3/uL (ref 0.1–1.0)
Monocytes Relative: 11 % (ref 3.0–12.0)
Neutro Abs: 3 10*3/uL (ref 1.4–7.7)
Neutrophils Relative %: 67.5 % (ref 43.0–77.0)
Platelets: 212 10*3/uL (ref 150.0–400.0)
RBC: 4.07 Mil/uL (ref 3.87–5.11)
RDW: 16.2 % — ABNORMAL HIGH (ref 11.5–15.5)
WBC: 4.4 10*3/uL (ref 4.0–10.5)

## 2023-05-07 LAB — COMPREHENSIVE METABOLIC PANEL
ALT: 157 U/L — ABNORMAL HIGH (ref 0–35)
AST: 73 U/L — ABNORMAL HIGH (ref 0–37)
Albumin: 4.9 g/dL (ref 3.5–5.2)
Alkaline Phosphatase: 46 U/L (ref 39–117)
BUN: 18 mg/dL (ref 6–23)
CO2: 33 meq/L — ABNORMAL HIGH (ref 19–32)
Calcium: 10.2 mg/dL (ref 8.4–10.5)
Chloride: 98 meq/L (ref 96–112)
Creatinine, Ser: 0.98 mg/dL (ref 0.40–1.20)
GFR: 68.11 mL/min (ref >60.00)
Glucose, Bld: 93 mg/dL (ref 70–99)
Potassium: 3.9 meq/L (ref 3.5–5.1)
Sodium: 138 meq/L (ref 135–145)
Total Bilirubin: 1 mg/dL (ref 0.2–1.2)
Total Protein: 7.4 g/dL (ref 6.0–8.3)

## 2023-05-07 LAB — C-REACTIVE PROTEIN: CRP: <1 mg/dL (ref 0.5–20.0)

## 2023-05-08 DIAGNOSIS — Z7682 Awaiting organ transplant status: Secondary | ICD-10-CM | POA: Diagnosis not present

## 2023-05-08 DIAGNOSIS — F54 Psychological and behavioral factors associated with disorders or diseases classified elsewhere: Secondary | ICD-10-CM | POA: Diagnosis not present

## 2023-05-09 DIAGNOSIS — M3502 Sicca syndrome with lung involvement: Secondary | ICD-10-CM | POA: Diagnosis not present

## 2023-05-09 DIAGNOSIS — Z79899 Other long term (current) drug therapy: Secondary | ICD-10-CM | POA: Diagnosis not present

## 2023-05-09 DIAGNOSIS — J849 Interstitial pulmonary disease, unspecified: Secondary | ICD-10-CM

## 2023-05-09 DIAGNOSIS — D8989 Other specified disorders involving the immune mechanism, not elsewhere classified: Secondary | ICD-10-CM | POA: Diagnosis not present

## 2023-05-10 DIAGNOSIS — H04123 Dry eye syndrome of bilateral lacrimal glands: Secondary | ICD-10-CM | POA: Diagnosis not present

## 2023-05-10 DIAGNOSIS — H018 Other specified inflammations of eyelid: Secondary | ICD-10-CM | POA: Diagnosis not present

## 2023-05-10 DIAGNOSIS — H02886 Meibomian gland dysfunction of left eye, unspecified eyelid: Secondary | ICD-10-CM | POA: Diagnosis not present

## 2023-05-10 DIAGNOSIS — B880 Other acariasis: Secondary | ICD-10-CM | POA: Diagnosis not present

## 2023-05-10 DIAGNOSIS — H02883 Meibomian gland dysfunction of right eye, unspecified eyelid: Secondary | ICD-10-CM | POA: Diagnosis not present

## 2023-05-13 DIAGNOSIS — G6289 Other specified polyneuropathies: Secondary | ICD-10-CM | POA: Diagnosis not present

## 2023-05-13 DIAGNOSIS — J849 Interstitial pulmonary disease, unspecified: Secondary | ICD-10-CM | POA: Diagnosis not present

## 2023-05-13 DIAGNOSIS — M7061 Trochanteric bursitis, right hip: Secondary | ICD-10-CM | POA: Diagnosis not present

## 2023-05-13 DIAGNOSIS — M7062 Trochanteric bursitis, left hip: Secondary | ICD-10-CM | POA: Diagnosis not present

## 2023-05-13 DIAGNOSIS — K760 Fatty (change of) liver, not elsewhere classified: Secondary | ICD-10-CM | POA: Diagnosis not present

## 2023-05-13 DIAGNOSIS — M3502 Sicca syndrome with lung involvement: Secondary | ICD-10-CM | POA: Diagnosis not present

## 2023-05-13 DIAGNOSIS — I7300 Raynaud's syndrome without gangrene: Secondary | ICD-10-CM | POA: Diagnosis not present

## 2023-05-13 DIAGNOSIS — D8989 Other specified disorders involving the immune mechanism, not elsewhere classified: Secondary | ICD-10-CM | POA: Diagnosis not present

## 2023-05-13 DIAGNOSIS — R7401 Elevation of levels of liver transaminase levels: Secondary | ICD-10-CM | POA: Diagnosis not present

## 2023-05-13 DIAGNOSIS — M7551 Bursitis of right shoulder: Secondary | ICD-10-CM | POA: Diagnosis not present

## 2023-05-15 DIAGNOSIS — Z7682 Awaiting organ transplant status: Secondary | ICD-10-CM | POA: Diagnosis not present

## 2023-05-15 DIAGNOSIS — F54 Psychological and behavioral factors associated with disorders or diseases classified elsewhere: Secondary | ICD-10-CM | POA: Diagnosis not present

## 2023-05-22 DIAGNOSIS — Z7682 Awaiting organ transplant status: Secondary | ICD-10-CM | POA: Diagnosis not present

## 2023-05-22 DIAGNOSIS — F54 Psychological and behavioral factors associated with disorders or diseases classified elsewhere: Secondary | ICD-10-CM | POA: Diagnosis not present

## 2023-05-26 DIAGNOSIS — J849 Interstitial pulmonary disease, unspecified: Secondary | ICD-10-CM | POA: Diagnosis not present

## 2023-05-31 ENCOUNTER — Other Ambulatory Visit

## 2023-05-31 DIAGNOSIS — M3502 Sicca syndrome with lung involvement: Secondary | ICD-10-CM

## 2023-05-31 DIAGNOSIS — J849 Interstitial pulmonary disease, unspecified: Secondary | ICD-10-CM | POA: Diagnosis not present

## 2023-05-31 LAB — CBC WITH DIFFERENTIAL/PLATELET
Basophils Absolute: 0 10*3/uL (ref 0.0–0.1)
Basophils Relative: 0.6 % (ref 0.0–3.0)
Eosinophils Absolute: 0.1 10*3/uL (ref 0.0–0.7)
Eosinophils Relative: 1.6 % (ref 0.0–5.0)
HCT: 38.4 % (ref 36.0–46.0)
Hemoglobin: 12.9 g/dL (ref 12.0–15.0)
Lymphocytes Relative: 22.1 % (ref 12.0–46.0)
Lymphs Abs: 0.9 10*3/uL (ref 0.7–4.0)
MCHC: 33.5 g/dL (ref 30.0–36.0)
MCV: 97.7 fl (ref 78.0–100.0)
Monocytes Absolute: 0.5 10*3/uL (ref 0.1–1.0)
Monocytes Relative: 12.9 % — ABNORMAL HIGH (ref 3.0–12.0)
Neutro Abs: 2.6 10*3/uL (ref 1.4–7.7)
Neutrophils Relative %: 62.8 % (ref 43.0–77.0)
Platelets: 180 10*3/uL (ref 150.0–400.0)
RBC: 3.92 Mil/uL (ref 3.87–5.11)
RDW: 15.9 % — ABNORMAL HIGH (ref 11.5–15.5)
WBC: 4.1 10*3/uL (ref 4.0–10.5)

## 2023-05-31 LAB — COMPREHENSIVE METABOLIC PANEL WITH GFR
ALT: 134 U/L — ABNORMAL HIGH (ref 0–35)
AST: 77 U/L — ABNORMAL HIGH (ref 0–37)
Albumin: 4.6 g/dL (ref 3.5–5.2)
Alkaline Phosphatase: 45 U/L (ref 39–117)
BUN: 16 mg/dL (ref 6–23)
CO2: 34 meq/L — ABNORMAL HIGH (ref 19–32)
Calcium: 9.5 mg/dL (ref 8.4–10.5)
Chloride: 101 meq/L (ref 96–112)
Creatinine, Ser: 0.89 mg/dL (ref 0.40–1.20)
GFR: 76.42 mL/min (ref >60.00)
Glucose, Bld: 96 mg/dL (ref 70–99)
Potassium: 4.2 meq/L (ref 3.5–5.1)
Sodium: 142 meq/L (ref 135–145)
Total Bilirubin: 0.8 mg/dL (ref 0.2–1.2)
Total Protein: 6.8 g/dL (ref 6.0–8.3)

## 2023-05-31 LAB — C-REACTIVE PROTEIN: CRP: <1 mg/dL (ref 0.5–20.0)

## 2023-06-02 ENCOUNTER — Other Ambulatory Visit: Payer: Self-pay | Admitting: Primary Care

## 2023-06-02 DIAGNOSIS — I10 Essential (primary) hypertension: Secondary | ICD-10-CM

## 2023-06-02 DIAGNOSIS — M3502 Sicca syndrome with lung involvement: Secondary | ICD-10-CM

## 2023-06-02 DIAGNOSIS — J849 Interstitial pulmonary disease, unspecified: Secondary | ICD-10-CM

## 2023-06-02 DIAGNOSIS — G4733 Obstructive sleep apnea (adult) (pediatric): Secondary | ICD-10-CM

## 2023-06-02 DIAGNOSIS — E785 Hyperlipidemia, unspecified: Secondary | ICD-10-CM

## 2023-06-02 DIAGNOSIS — E6609 Other obesity due to excess calories: Secondary | ICD-10-CM

## 2023-06-13 ENCOUNTER — Encounter: Payer: Self-pay | Admitting: Primary Care

## 2023-06-13 ENCOUNTER — Ambulatory Visit (INDEPENDENT_AMBULATORY_CARE_PROVIDER_SITE_OTHER): Payer: Medicaid Other | Admitting: Primary Care

## 2023-06-13 VITALS — BP 138/80 | HR 71 | Temp 97.3°F | Ht 69.0 in | Wt 225.0 lb

## 2023-06-13 DIAGNOSIS — Z6833 Body mass index (BMI) 33.0-33.9, adult: Secondary | ICD-10-CM | POA: Diagnosis not present

## 2023-06-13 DIAGNOSIS — E66811 Obesity, class 1: Secondary | ICD-10-CM | POA: Diagnosis not present

## 2023-06-13 DIAGNOSIS — G622 Polyneuropathy due to other toxic agents: Secondary | ICD-10-CM | POA: Diagnosis not present

## 2023-06-13 DIAGNOSIS — M3502 Sicca syndrome with lung involvement: Secondary | ICD-10-CM

## 2023-06-13 DIAGNOSIS — E6609 Other obesity due to excess calories: Secondary | ICD-10-CM

## 2023-06-13 DIAGNOSIS — F411 Generalized anxiety disorder: Secondary | ICD-10-CM | POA: Diagnosis not present

## 2023-06-13 DIAGNOSIS — K219 Gastro-esophageal reflux disease without esophagitis: Secondary | ICD-10-CM

## 2023-06-13 DIAGNOSIS — Z0000 Encounter for general adult medical examination without abnormal findings: Secondary | ICD-10-CM

## 2023-06-13 DIAGNOSIS — E785 Hyperlipidemia, unspecified: Secondary | ICD-10-CM

## 2023-06-13 DIAGNOSIS — Z1231 Encounter for screening mammogram for malignant neoplasm of breast: Secondary | ICD-10-CM

## 2023-06-13 DIAGNOSIS — E559 Vitamin D deficiency, unspecified: Secondary | ICD-10-CM

## 2023-06-13 DIAGNOSIS — J310 Chronic rhinitis: Secondary | ICD-10-CM | POA: Diagnosis not present

## 2023-06-13 DIAGNOSIS — J849 Interstitial pulmonary disease, unspecified: Secondary | ICD-10-CM

## 2023-06-13 DIAGNOSIS — J4550 Severe persistent asthma, uncomplicated: Secondary | ICD-10-CM

## 2023-06-13 DIAGNOSIS — I10 Essential (primary) hypertension: Secondary | ICD-10-CM

## 2023-06-13 LAB — COMPREHENSIVE METABOLIC PANEL WITH GFR
ALT: 111 U/L — ABNORMAL HIGH (ref 0–35)
AST: 46 U/L — ABNORMAL HIGH (ref 0–37)
Albumin: 4.8 g/dL (ref 3.5–5.2)
Alkaline Phosphatase: 47 U/L (ref 39–117)
BUN: 17 mg/dL (ref 6–23)
CO2: 33 meq/L — ABNORMAL HIGH (ref 19–32)
Calcium: 9.6 mg/dL (ref 8.4–10.5)
Chloride: 101 meq/L (ref 96–112)
Creatinine, Ser: 0.91 mg/dL (ref 0.40–1.20)
GFR: 74.39 mL/min (ref >60.00)
Glucose, Bld: 100 mg/dL — ABNORMAL HIGH (ref 70–99)
Potassium: 3.9 meq/L (ref 3.5–5.1)
Sodium: 140 meq/L (ref 135–145)
Total Bilirubin: 0.8 mg/dL (ref 0.2–1.2)
Total Protein: 7 g/dL (ref 6.0–8.3)

## 2023-06-13 LAB — CBC WITH DIFFERENTIAL/PLATELET
Basophils Absolute: 0 10*3/uL (ref 0.0–0.1)
Basophils Relative: 0.3 % (ref 0.0–3.0)
Eosinophils Absolute: 0.1 10*3/uL (ref 0.0–0.7)
Eosinophils Relative: 1.2 % (ref 0.0–5.0)
HCT: 39.9 % (ref 36.0–46.0)
Hemoglobin: 13.4 g/dL (ref 12.0–15.0)
Lymphocytes Relative: 18.1 % (ref 12.0–46.0)
Lymphs Abs: 1 10*3/uL (ref 0.7–4.0)
MCHC: 33.5 g/dL (ref 30.0–36.0)
MCV: 97.1 fl (ref 78.0–100.0)
Monocytes Absolute: 0.6 10*3/uL (ref 0.1–1.0)
Monocytes Relative: 10.9 % (ref 3.0–12.0)
Neutro Abs: 3.7 10*3/uL (ref 1.4–7.7)
Neutrophils Relative %: 69.5 % (ref 43.0–77.0)
Platelets: 203 10*3/uL (ref 150.0–400.0)
RBC: 4.11 Mil/uL (ref 3.87–5.11)
RDW: 15.3 % (ref 11.5–15.5)
WBC: 5.3 10*3/uL (ref 4.0–10.5)

## 2023-06-13 LAB — LIPID PANEL
Cholesterol: 176 mg/dL (ref 0–200)
HDL: 67.2 mg/dL (ref >39.00)
LDL Cholesterol: 91 mg/dL (ref 0–99)
NonHDL: 109.12
Total CHOL/HDL Ratio: 3
Triglycerides: 91 mg/dL (ref 0.0–149.0)
VLDL: 18.2 mg/dL (ref 0.0–40.0)

## 2023-06-13 LAB — VITAMIN D 25 HYDROXY (VIT D DEFICIENCY, FRACTURES): VITD: 70.05 ng/mL (ref 30.00–100.00)

## 2023-06-13 LAB — C-REACTIVE PROTEIN: CRP: <1 mg/dL (ref 0.5–20.0)

## 2023-06-13 LAB — HEMOGLOBIN A1C: Hgb A1c MFr Bld: 5.6 % (ref 4.6–6.5)

## 2023-06-13 NOTE — Assessment & Plan Note (Signed)
 No longer on inhalers as they are ineffective. Following with [facility] pulmonology, office notes reviewed from January 2025.

## 2023-06-13 NOTE — Assessment & Plan Note (Signed)
 Stable.  Continue Lyrica 25 mg twice daily.

## 2023-06-13 NOTE — Progress Notes (Addendum)
 Subjective:    Patient ID: Hannah Klein, female    DOB: July 29, 1975, 48 y.o.   MRN: [id number]  HPI  Hannah Klein is a very pleasant 48 y.o. female with a history of ILD, Sjogren syndrome with lung involvement, hypertension, GAD, OSA, obesity who presents today for complete physical and follow up of chronic conditions.  Immunizations: -Tetanus: Completed in 2019 -Shingles: Completed Shingrix does x 1 -Pneumonia: Completed Prevnar 20 in 2024  Diet: Fair diet.  Exercise: Regular exercise.  Eye exam: Completes annually  Dental exam: Completes semi-annually    Pap Smear: Completed in May 2022 Mammogram: Completed in May 2024  Colonoscopy: Completed CT colonography screening in 2024  BP Readings from Last 3 Encounters:  06/13/23 138/80  04/05/23 132/72  02/06/23 130/62   Wt Readings from Last 3 Encounters:  06/13/23 225 lb (102.1 kg)  04/05/23 228 lb (103.4 kg)  04/02/23 227 lb 15.3 oz (103.4 kg)    She is working on weight loss. Is involved with weight watchers and also managed on Wegovy, dose increased to 1 mg a few days ago.    Review of Systems  Constitutional:  Negative for unexpected weight change.  HENT:  Negative for rhinorrhea.   Respiratory:  Positive for cough and shortness of breath.   Cardiovascular:  Negative for chest pain.  Gastrointestinal:  Negative for constipation and diarrhea.  Genitourinary:  Negative for difficulty urinating.  Musculoskeletal:  Negative for arthralgias and myalgias.  Skin:  Negative for rash.  Allergic/Immunologic: Negative for environmental allergies.  Neurological:  Positive for numbness. Negative for dizziness and headaches.  Psychiatric/Behavioral:  The patient is not nervous/anxious.          Past Medical History:  Diagnosis Date   Arthritis    Asthma    Environmental allergies    Esophageal dysphagia    Generalized anxiety disorder    GERD (gastroesophageal reflux disease)    History of hiatal  hernia    Hx of colonic polyps    Hypertension    Lung disorder    Patient states she has two unidentified ILD   Migraines    MRSA (methicillin resistant Staphylococcus aureus)    Lt upper arm  approx 8 years ago   Pneumonitis 01/26/2022   Right tennis elbow 04/28/2021   Right-sided low back pain with right-sided sciatica 04/26/2015   S/P robot-assisted surgical procedure 03/08/2022   Sleep apnea     Social History   Socioeconomic History   Marital status: Single    Spouse name: Not on file   Number of children: 0   Years of education: Not on file   Highest education level: Associate degree: academic program  Occupational History   Occupation: Designer, industrial/product work  Tobacco Use   Smoking status: Former    Current packs/day: 0.00    Average packs/day: 2.0 packs/day for 18.0 years (36.1 ttl pk-yrs)    Types: Cigarettes    Start date: 9    Quit date: 03/16/2007    Years since quitting: 16.2    Passive exposure: Current   Smokeless tobacco: Never  Vaping Use   Vaping status: Never Used  Substance and Sexual Activity   Alcohol use: No    Alcohol/week: 0.0 standard drinks of alcohol   Drug use: No   Sexual activity: Not Currently  Other Topics Concern   Not on file  Social History Narrative   Single.   Moved from [place].   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 Filing for disability   Social Drivers of Health   Financial Resource Strain: Low Risk  (04/03/2023)   Overall Financial Resource Strain (CARDIA)    Difficulty of Paying Living Expenses: Not hard at all  Food Insecurity: No Food Insecurity (04/03/2023)   Hunger Vital Sign    Worried About Running Out of Food in the Last Year: Never true    Ran Out of Food in the Last Year: Never true  Transportation Needs: No Transportation Needs (04/03/2023)   PRAPARE - Administrator, Civil Service (Medical): No    Lack of Transportation (Non-Medical): No  Physical Activity: Insufficiently Active  (04/03/2023)   Exercise Vital Sign    Days of Exercise per Week: 3 days    Minutes of Exercise per Session: 20 min  Stress: No Stress Concern Present (04/03/2023)   [employer] of Occupational Health - Occupational Stress Questionnaire    Feeling of Stress : Not at all  Social Connections: Unknown (04/03/2023)   Social Connection and Isolation Panel [NHANES]    Frequency of Communication with Friends and Family: More than three times a week    Frequency of Social Gatherings with Friends and Family: Twice a week    Attends Religious Services: Patient declined    Database administrator or Organizations: No    Attends Engineer, structural: Not on file    Marital Status: Never married  Intimate Partner Violence: Not At Risk (01/26/2022)   Humiliation, Afraid, Rape, and Kick questionnaire    Fear of Current or Ex-Partner: No    Emotionally Abused: No    Physically Abused: No    Sexually Abused: No    Past Surgical History:  Procedure Laterality Date   CHOLECYSTECTOMY     DILATION AND CURETTAGE OF UTERUS     INTERCOSTAL NERVE BLOCK Right 03/08/2022   Procedure: INTERCOSTAL NERVE BLOCK;  Surgeon: Corliss Skains, MD;  Location: [facility] OR;  Service: Thoracic;  Laterality: Right;   LUNG BIOPSY  02/2022   NECK SURGERY     Fusion to C6 and C7   TYMPANOSTOMY TUBE PLACEMENT     WRIST ARTHROSCOPY WITH DEBRIDEMENT Left 02/27/2018   Procedure: WRIST ARTHROSCOPY WITH DEBRIDEMENT;  Surgeon: Betha Loa, MD;  Location: [facility];  Service: Orthopedics;  Laterality: Left;    Family History  Problem Relation Age of Onset   Eczema Mother    Allergic rhinitis Brother    Asthma Brother    Asthma Brother    Allergic rhinitis Brother    Urticaria Other    Angioedema Other    Breast cancer Neg Hx     Allergies  Allergen Reactions   Advair Diskus [Fluticasone-Salmeterol] Other (See Comments)    "dries my throat out really bad'   Flonase [Fluticasone]     Nose  bleeds   Lipitor [Atorvastatin Calcium] Other (See Comments)    Elevations in blood pressure?    Current Outpatient Medications on File Prior to Visit  Medication Sig Dispense Refill   acetaminophen (TYLENOL) 500 MG tablet Take 2 tablets (1,000 mg total) by mouth every 6 (six) hours as needed. 30 tablet 0   albuterol (VENTOLIN HFA) 108 (90 Base) MCG/ACT inhaler INHALE 1-2 PUFFS BY MOUTH EVERY 6 HOURS AS NEEDED FOR WHEEZE OR SHORTNESS OF BREATH 18 g 0   APPLE CIDER VINEGAR PO Take 450 mg by mouth daily.     azaTHIOprine (IMURAN) 50 MG tablet Take 200 mg  by mouth daily.     B Complex-C (B-COMPLEX WITH VITAMIN C) tablet Take 1 tablet by mouth daily.     Cholecalciferol (VITAMIN D3) 125 MCG (5000 UT) TABS Take 5,000 Units by mouth daily.     cycloSPORINE (RESTASIS) 0.05 % ophthalmic emulsion Place 1 drop into both eyes 2 (two) times daily.     desonide (DESOWEN) 0.05 % cream Apply 1 Application topically daily as needed.     doxycycline (MONODOX) 50 MG capsule Take 50 mg by mouth every 12 (twelve) hours.     econazole nitrate 1 % cream Apply 1 Application topically daily as needed.     esomeprazole (NEXIUM) 40 MG capsule TAKE 1 CAPSULE (40 MG TOTAL) BY MOUTH 2 (TWO) TIMES DAILY. FOR HEARTBURN. 180 capsule 2   famotidine (PEPCID) 40 MG tablet TAKE 1 TABLET (40 MG TOTAL) BY MOUTH 2 (TWO) TIMES DAILY. FOR HEARTBURN. 180 tablet 0   fexofenadine (ALLEGRA) 180 MG tablet Take 180 mg by mouth daily.     hydrOXYzine (VISTARIL) 50 MG capsule TAKE 1 CAPSULE BY MOUTH ONCE OR TWICE DAILY AS NEEDED FOR ANXIETY 180 capsule 0   levocetirizine (XYZAL) 5 MG tablet Take 1 tablet (5 mg total) by mouth 2 (two) times daily. For allergies 180 tablet 3   Multiple Vitamin (MULTIVITAMIN) capsule Take 1 capsule by mouth daily.     Multiple Vitamin (MULTIVITAMIN) tablet Take 1 tablet by mouth daily.     Multiple Vitamins-Minerals (IMMUNE SUPPORT PO) Take by mouth.     neomycin-polymyxin b-dexamethasone (MAXITROL)  3.5-10000-0.1 OINT Place 1 Application into both eyes at bedtime.     Omega-3 Fatty Acids (OMEGA-3 FISH OIL) 300 MG CAPS Take 1,000 mg by mouth.     ondansetron (ZOFRAN) 4 MG tablet Take 1 tablet (4 mg total) by mouth every 8 (eight) hours as needed for nausea or vomiting. 20 tablet 0   predniSONE (DELTASONE) 10 MG tablet TAKE 2 TABLETS BY MOUTH TWICE DAILY 120 tablet 3   pregabalin (LYRICA) 25 MG capsule Take 25 mg by mouth 2 (two) times daily.     rosuvastatin (CRESTOR) 10 MG tablet TAKE 1 TABLET BY MOUTH EVERY DAY FOR CHOLESTEROL 90 tablet 0   Semaglutide-Weight Management 1 MG/0.5ML SOAJ Inject 1 mg into the skin once a week. 2 mL 0   sulfamethoxazole-trimethoprim (BACTRIM) 400-80 MG tablet Take 1 tablet by mouth daily.     vitamin B-12 (CYANOCOBALAMIN) 1000 MCG tablet Take 1,000 mcg by mouth daily.     vitamin C (ASCORBIC ACID) 500 MG tablet Take 500 mg by mouth daily.     vitamin E 180 MG (400 UNITS) capsule Take 400 Units by mouth daily.     XDEMVY 0.25 % SOLN Apply to eye.     formoterol (PERFOROMIST) 20 MCG/2ML nebulizer solution Take 20 mcg by nebulization 2 (two) times daily. (Patient not taking: Reported on 06/13/2023)     predniSONE (DELTASONE) 5 MG tablet Take 1 tablet (5 mg total) by mouth daily with breakfast. (Patient not taking: Reported on 06/13/2023) 90 tablet 1   No current facility-administered medications on file prior to visit.    BP 138/80   Pulse 71   Temp (!) 97.3 F (36.3 C) (Temporal)   Ht 5\' 9"  (1.753 m)   Wt 225 lb (102.1 kg)   SpO2 100% Comment: 1 L of 02  BMI 33.23 kg/m  Objective:   Physical Exam HENT:     Right Ear: Tympanic membrane and ear canal normal.  Left Ear: Tympanic membrane and ear canal normal.  Eyes:     Pupils: Pupils are equal, round, and reactive to light.  Cardiovascular:     Rate and Rhythm: Normal rate and regular rhythm.  Pulmonary:     Effort: Pulmonary effort is normal.     Breath sounds: Normal breath sounds.  Abdominal:      General: Bowel sounds are normal.     Palpations: Abdomen is soft.     Tenderness: There is no abdominal tenderness.  Musculoskeletal:        General: Normal range of motion.     Cervical back: Neck supple.  Skin:    General: Skin is warm and dry.  Neurological:     Mental Status: She is alert and oriented to person, place, and time.     Cranial Nerves: No cranial nerve deficit.     Deep Tendon Reflexes:     Reflex Scores:      Patellar reflexes are 2+ on the right side and 2+ on the left side. Psychiatric:        Mood and Affect: Mood normal.           Assessment & Plan:  ILD (interstitial lung disease) (HCC) Assessment & Plan: Following with pulmonology through [facility], office notes reviewed from January 2025.  Continue continuous oxygen 4-6 liters. Continue azathioprine 200 mg daily.  Orders: -     Comprehensive metabolic panel with GFR -     CBC with Differential/Platelet -     C-reactive protein  Sjogren's syndrome with lung involvement (HCC)  Hyperlipidemia, unspecified hyperlipidemia type Assessment & Plan: Repeat lipid panel pending. Continue to work towards weight loss.  Orders: -     Lipid panel -     Hemoglobin A1c  Screening mammogram for breast cancer -     3D Screening Mammogram, Left and Right; Future  Vitamin D deficiency Assessment & Plan: Repeat vitamin D level pending.  Orders: -     VITAMIN D 25 Hydroxy (Vit-D Deficiency, Fractures)  Essential hypertension Assessment & Plan: Borderline high today. Reviewed BP from other office visits, BP within normal range.  Continue to monitor.    Severe persistent asthma without complication Assessment & Plan: No longer on inhalers as they are ineffective. Following with [facility] pulmonology, office notes reviewed from January 2025.   Chronic rhinitis Assessment & Plan: Stable.  Continue Xyzal 5 mg BID.    Gastroesophageal reflux disease, unspecified whether esophagitis  present Assessment & Plan: Slightly increased with Wegovy, but tolerable.  Continue Nexium 40 mg twice daily, famotidine 40 mg daily.   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  Continue Lyrica 25 mg twice daily.   Class 1 obesity due to excess calories with serious comorbidity and body mass index (BMI) of 33.0 to 33.9 in adult Assessment & Plan: Slow progress, although she has only had one injection of the 1 mg dose of Wegovy.  Continue Wegovy 1 mg weekly.  Plan is to titrate upward accordingly.  She will update regarding her weight in about 3 weeks. Continue with weight watchers.  Follow-up in 3 months.    Sjogren syndrome with lung involvement [facility]) Assessment & Plan: Following with rheumatology, office notes reviewed from March 2025.  Continue azathioprine 200 mg daily, prednisone 10 mg daily.    Generalized anxiety disorder Assessment & Plan: Stable.  No concerns today.  Continue hydroxyzine 50 mg twice daily as needed.   Preventative health care  Assessment & Plan: Second Shingrix vaccine due, she declines today. Pap smear UTD. Mammogram due, orders placed. Colonoscopy UTD, unclear when due. Reviewed CT from [facility].  Discussed the importance of a healthy diet and regular exercise in order for weight loss, and to reduce the risk of further co-morbidity.  Exam stable. Labs pending.  Follow up in 1 year for repeat physical.          Kess Mcilwain K Nixon [place], NP

## 2023-06-13 NOTE — Assessment & Plan Note (Addendum)
 Borderline high today. Reviewed BP from other office visits, BP within normal range.  Continue to monitor.

## 2023-06-13 NOTE — Assessment & Plan Note (Signed)
 Repeat lipid panel pending. Continue to work towards weight loss.

## 2023-06-13 NOTE — Assessment & Plan Note (Signed)
Repeat vitamin D level pending. 

## 2023-06-13 NOTE — Assessment & Plan Note (Signed)
 Following with rheumatology, office notes reviewed from March 2025.  Continue azathioprine 200 mg daily, prednisone 10 mg daily.

## 2023-06-13 NOTE — Assessment & Plan Note (Signed)
 Stable.  No concerns today.  Continue hydroxyzine 50 mg twice daily as needed.

## 2023-06-13 NOTE — Assessment & Plan Note (Addendum)
 Stable.  Continue Xyzal 5 mg BID.

## 2023-06-13 NOTE — Assessment & Plan Note (Signed)
 Following with pulmonology through [facility], office notes reviewed from January 2025.  Continue continuous oxygen 4-6 liters. Continue azathioprine 200 mg daily.

## 2023-06-13 NOTE — Patient Instructions (Signed)
 Stop by the lab prior to leaving today. I will notify you of your results once received.   Complete your mammogram as scheduled.  Ask about the shingles vaccine.   Continue Wegovy 1 mg weekly for another 1 month. Keep me updated regarding your weight.  Please schedule a follow up visit for 3 months for weight check.  It was a pleasure to see you today!

## 2023-06-13 NOTE — Assessment & Plan Note (Signed)
 Slightly increased with Wegovy, but tolerable.  Continue Nexium 40 mg twice daily, famotidine 40 mg daily.

## 2023-06-13 NOTE — Assessment & Plan Note (Signed)
 Slow progress, although she has only had one injection of the 1 mg dose of Wegovy.  Continue Wegovy 1 mg weekly.  Plan is to titrate upward accordingly.  She will update regarding her weight in about 3 weeks. Continue with weight watchers.  Follow-up in 3 months.

## 2023-06-13 NOTE — Assessment & Plan Note (Signed)
 Second Shingrix vaccine due, she declines today. Pap smear UTD. Mammogram due, orders placed. Colonoscopy UTD, unclear when due. Reviewed CT from [facility].  Discussed the importance of a healthy diet and regular exercise in order for weight loss, and to reduce the risk of further co-morbidity.  Exam stable. Labs pending.  Follow up in 1 year for repeat physical.

## 2023-06-17 ENCOUNTER — Other Ambulatory Visit: Payer: Self-pay | Admitting: Pulmonary Disease

## 2023-06-17 MED ORDER — ESOMEPRAZOLE MAGNESIUM 40 MG PO CPDR: 40.0000 mg | DELAYED_RELEASE_CAPSULE | Freq: Two times a day (BID) | ORAL | 3 refills | Status: AC

## 2023-06-18 ENCOUNTER — Other Ambulatory Visit: Payer: Self-pay | Admitting: Pulmonary Disease

## 2023-06-24 DIAGNOSIS — D8989 Other specified disorders involving the immune mechanism, not elsewhere classified: Secondary | ICD-10-CM | POA: Diagnosis not present

## 2023-06-24 DIAGNOSIS — J849 Interstitial pulmonary disease, unspecified: Secondary | ICD-10-CM | POA: Diagnosis not present

## 2023-06-24 DIAGNOSIS — R7401 Elevation of levels of liver transaminase levels: Secondary | ICD-10-CM | POA: Diagnosis not present

## 2023-06-24 DIAGNOSIS — M7062 Trochanteric bursitis, left hip: Secondary | ICD-10-CM | POA: Diagnosis not present

## 2023-06-24 DIAGNOSIS — M3502 Sicca syndrome with lung involvement: Secondary | ICD-10-CM | POA: Diagnosis not present

## 2023-06-24 DIAGNOSIS — M7061 Trochanteric bursitis, right hip: Secondary | ICD-10-CM | POA: Diagnosis not present

## 2023-06-24 DIAGNOSIS — M7551 Bursitis of right shoulder: Secondary | ICD-10-CM | POA: Diagnosis not present

## 2023-06-24 DIAGNOSIS — Z79899 Other long term (current) drug therapy: Secondary | ICD-10-CM | POA: Diagnosis not present

## 2023-06-25 DIAGNOSIS — G4733 Obstructive sleep apnea (adult) (pediatric): Secondary | ICD-10-CM | POA: Diagnosis not present

## 2023-06-26 DIAGNOSIS — J849 Interstitial pulmonary disease, unspecified: Secondary | ICD-10-CM | POA: Diagnosis not present

## 2023-06-28 ENCOUNTER — Ambulatory Visit
Admission: RE | Admit: 2023-06-28 | Discharge: 2023-06-28 | Disposition: A | Discharge status: Home / Self Care | Source: Ambulatory Visit | Attending: Internal Medicine | Admitting: Internal Medicine

## 2023-06-28 DIAGNOSIS — Z1231 Encounter for screening mammogram for malignant neoplasm of breast: Secondary | ICD-10-CM

## 2023-06-30 DIAGNOSIS — E6609 Other obesity due to excess calories: Secondary | ICD-10-CM

## 2023-06-30 DIAGNOSIS — M3502 Sicca syndrome with lung involvement: Secondary | ICD-10-CM

## 2023-06-30 DIAGNOSIS — J849 Interstitial pulmonary disease, unspecified: Secondary | ICD-10-CM

## 2023-06-30 DIAGNOSIS — I10 Essential (primary) hypertension: Secondary | ICD-10-CM

## 2023-06-30 DIAGNOSIS — E785 Hyperlipidemia, unspecified: Secondary | ICD-10-CM

## 2023-07-01 MED ORDER — TIRZEPATIDE-WEIGHT MANAGEMENT 2.5 MG/0.5ML ~~LOC~~ SOAJ: 2.5000 mg | SUBCUTANEOUS | 0 refills | Status: DC

## 2023-07-03 DIAGNOSIS — R918 Other nonspecific abnormal finding of lung field: Secondary | ICD-10-CM | POA: Diagnosis not present

## 2023-07-03 DIAGNOSIS — Z01818 Encounter for other preprocedural examination: Secondary | ICD-10-CM | POA: Diagnosis not present

## 2023-07-03 DIAGNOSIS — Z7682 Awaiting organ transplant status: Secondary | ICD-10-CM | POA: Diagnosis not present

## 2023-07-03 DIAGNOSIS — J849 Interstitial pulmonary disease, unspecified: Secondary | ICD-10-CM | POA: Diagnosis not present

## 2023-07-04 ENCOUNTER — Ambulatory Visit

## 2023-07-05 DIAGNOSIS — H04123 Dry eye syndrome of bilateral lacrimal glands: Secondary | ICD-10-CM | POA: Diagnosis not present

## 2023-07-05 DIAGNOSIS — H0288B Meibomian gland dysfunction left eye, upper and lower eyelids: Secondary | ICD-10-CM | POA: Diagnosis not present

## 2023-07-05 DIAGNOSIS — H018 Other specified inflammations of eyelid: Secondary | ICD-10-CM | POA: Diagnosis not present

## 2023-07-05 DIAGNOSIS — B880 Other acariasis: Secondary | ICD-10-CM | POA: Diagnosis not present

## 2023-07-05 DIAGNOSIS — H0288A Meibomian gland dysfunction right eye, upper and lower eyelids: Secondary | ICD-10-CM | POA: Diagnosis not present

## 2023-07-08 DIAGNOSIS — D8989 Other specified disorders involving the immune mechanism, not elsewhere classified: Secondary | ICD-10-CM | POA: Diagnosis not present

## 2023-07-08 DIAGNOSIS — M3502 Sicca syndrome with lung involvement: Secondary | ICD-10-CM | POA: Diagnosis not present

## 2023-07-08 DIAGNOSIS — J849 Interstitial pulmonary disease, unspecified: Secondary | ICD-10-CM | POA: Diagnosis not present

## 2023-07-08 NOTE — Telephone Encounter (Signed)
 Can we check on the PA for Mounjaro ? She couldn't tolerate Ozempic  due to persistent nausea.

## 2023-07-08 NOTE — Telephone Encounter (Signed)
 Kelli, have you spoken with the PA team?

## 2023-07-09 ENCOUNTER — Other Ambulatory Visit (HOSPITAL_COMMUNITY): Payer: Self-pay

## 2023-07-09 ENCOUNTER — Telehealth: Payer: Self-pay

## 2023-07-09 NOTE — Telephone Encounter (Signed)
 Pharmacy Patient Advocate Encounter   Received notification from Patient Advice Request messages that prior authorization for Zepbound  2.5MG /0.5ML pen-injectors is required/requested.   Insurance verification completed.   The patient is insured through [facility] .   Per test claim: PA required; PA submitted to above mentioned insurance via CoverMyMeds Key/confirmation #/EOC [id number] Status is pending

## 2023-07-09 NOTE — Telephone Encounter (Signed)
 PA request has been Submitted. New Encounter has been or will be created for follow up. For additional info see Pharmacy Prior Auth telephone encounter from 07-09-2023.

## 2023-07-10 NOTE — Telephone Encounter (Signed)
 Will they except a letter from me stating that she cannot tolerate Wegovy ?    I evaluated her on 06/13/2023 for her annual physical.  She messaged me on 07/01/2023 for MyChart stating that she cannot tolerate Wegovy  due to persistent nausea.

## 2023-07-10 NOTE — Telephone Encounter (Signed)
 Pharmacy Patient Advocate Encounter  Received notification from [facility] that Prior Authorization for Zepbound  2.5MG /0.5ML pen-injectors has been DENIED.  Full denial letter will be uploaded to the media tab. See denial reason below.   PA #/Case ID/Reference #: [id number]

## 2023-07-15 ENCOUNTER — Other Ambulatory Visit: Payer: Self-pay | Admitting: Primary Care

## 2023-07-15 DIAGNOSIS — J4540 Moderate persistent asthma, uncomplicated: Secondary | ICD-10-CM

## 2023-07-15 DIAGNOSIS — R748 Abnormal levels of other serum enzymes: Secondary | ICD-10-CM | POA: Diagnosis not present

## 2023-07-15 NOTE — Telephone Encounter (Signed)
 Routed letter to Isa Manuel [employer]

## 2023-07-18 ENCOUNTER — Other Ambulatory Visit (HOSPITAL_COMMUNITY): Payer: Self-pay

## 2023-07-18 ENCOUNTER — Telehealth: Payer: Self-pay | Admitting: Pharmacist

## 2023-07-18 NOTE — Telephone Encounter (Signed)
 Appeal has been submitted for Zepbound . Will advise when response is received, please be advised that most companies may take 30 days to make a decision. Appeal letter and supporting documentation have been faxed to [phone] on 07/18/2023@12 :51 pm.  Thank you, Dene Fines, PharmD Clinical Pharmacist  [facility]  Direct Dial: [phone]

## 2023-07-23 NOTE — Telephone Encounter (Signed)
 Noted

## 2023-07-23 NOTE — Telephone Encounter (Signed)
 The appeal for Zepbound has been approved by the insurance:    Thank you, Dene Fines, PharmD Clinical Pharmacist  [place]  Direct Dial: [phone]

## 2023-07-24 DIAGNOSIS — J849 Interstitial pulmonary disease, unspecified: Secondary | ICD-10-CM | POA: Diagnosis not present

## 2023-07-24 DIAGNOSIS — D8989 Other specified disorders involving the immune mechanism, not elsewhere classified: Secondary | ICD-10-CM | POA: Diagnosis not present

## 2023-07-24 DIAGNOSIS — M3502 Sicca syndrome with lung involvement: Secondary | ICD-10-CM | POA: Diagnosis not present

## 2023-07-25 DIAGNOSIS — R748 Abnormal levels of other serum enzymes: Secondary | ICD-10-CM | POA: Diagnosis not present

## 2023-07-26 DIAGNOSIS — J849 Interstitial pulmonary disease, unspecified: Secondary | ICD-10-CM | POA: Diagnosis not present

## 2023-07-29 ENCOUNTER — Other Ambulatory Visit: Payer: Self-pay | Admitting: Primary Care

## 2023-07-29 DIAGNOSIS — K219 Gastro-esophageal reflux disease without esophagitis: Secondary | ICD-10-CM

## 2023-07-29 DIAGNOSIS — E785 Hyperlipidemia, unspecified: Secondary | ICD-10-CM

## 2023-07-31 ENCOUNTER — Other Ambulatory Visit: Payer: Self-pay | Admitting: Primary Care

## 2023-07-31 DIAGNOSIS — F411 Generalized anxiety disorder: Secondary | ICD-10-CM

## 2023-08-12 DIAGNOSIS — R7401 Elevation of levels of liver transaminase levels: Secondary | ICD-10-CM | POA: Diagnosis not present

## 2023-08-12 DIAGNOSIS — M3502 Sicca syndrome with lung involvement: Secondary | ICD-10-CM | POA: Diagnosis not present

## 2023-08-12 DIAGNOSIS — J849 Interstitial pulmonary disease, unspecified: Secondary | ICD-10-CM | POA: Diagnosis not present

## 2023-08-12 DIAGNOSIS — Z79899 Other long term (current) drug therapy: Secondary | ICD-10-CM | POA: Diagnosis not present

## 2023-08-19 DIAGNOSIS — Z6833 Body mass index (BMI) 33.0-33.9, adult: Secondary | ICD-10-CM

## 2023-08-19 DIAGNOSIS — I10 Essential (primary) hypertension: Secondary | ICD-10-CM

## 2023-08-19 DIAGNOSIS — E785 Hyperlipidemia, unspecified: Secondary | ICD-10-CM

## 2023-08-19 DIAGNOSIS — J849 Interstitial pulmonary disease, unspecified: Secondary | ICD-10-CM

## 2023-08-19 DIAGNOSIS — M3502 Sicca syndrome with lung involvement: Secondary | ICD-10-CM

## 2023-08-19 MED ORDER — ZEPBOUND 5 MG/0.5ML ~~LOC~~ SOAJ
5.0000 mg | SUBCUTANEOUS | 0 refills | Status: DC
Start: 2023-08-19 — End: 2023-09-12

## 2023-08-26 DIAGNOSIS — J849 Interstitial pulmonary disease, unspecified: Secondary | ICD-10-CM | POA: Diagnosis not present

## 2023-09-12 ENCOUNTER — Ambulatory Visit: Admitting: Primary Care

## 2023-09-12 ENCOUNTER — Encounter: Payer: Self-pay | Admitting: Primary Care

## 2023-09-12 ENCOUNTER — Ambulatory Visit: Payer: Self-pay | Admitting: Primary Care

## 2023-09-12 VITALS — BP 112/62 | HR 72 | Temp 97.2°F | Ht 69.0 in | Wt 228.0 lb

## 2023-09-12 DIAGNOSIS — Z6833 Body mass index (BMI) 33.0-33.9, adult: Secondary | ICD-10-CM | POA: Diagnosis not present

## 2023-09-12 DIAGNOSIS — E6609 Other obesity due to excess calories: Secondary | ICD-10-CM | POA: Diagnosis not present

## 2023-09-12 DIAGNOSIS — M3502 Sicca syndrome with lung involvement: Secondary | ICD-10-CM | POA: Diagnosis not present

## 2023-09-12 DIAGNOSIS — J849 Interstitial pulmonary disease, unspecified: Secondary | ICD-10-CM | POA: Diagnosis not present

## 2023-09-12 DIAGNOSIS — E66811 Obesity, class 1: Secondary | ICD-10-CM | POA: Diagnosis not present

## 2023-09-12 LAB — COMPREHENSIVE METABOLIC PANEL WITH GFR
ALT: 39 U/L — ABNORMAL HIGH (ref 0–35)
AST: 26 U/L (ref 0–37)
Albumin: 4.7 g/dL (ref 3.5–5.2)
Alkaline Phosphatase: 47 U/L (ref 39–117)
BUN: 24 mg/dL — ABNORMAL HIGH (ref 6–23)
CO2: 34 meq/L — ABNORMAL HIGH (ref 19–32)
Calcium: 9.5 mg/dL (ref 8.4–10.5)
Chloride: 100 meq/L (ref 96–112)
Creatinine, Ser: 0.91 mg/dL (ref 0.40–1.20)
GFR: 74.26 mL/min (ref >60.00)
Glucose, Bld: 78 mg/dL (ref 70–99)
Potassium: 3.9 meq/L (ref 3.5–5.1)
Sodium: 141 meq/L (ref 135–145)
Total Bilirubin: 1 mg/dL (ref 0.2–1.2)
Total Protein: 6.9 g/dL (ref 6.0–8.3)

## 2023-09-12 LAB — CBC WITH DIFFERENTIAL/PLATELET
Basophils Absolute: 0 K/uL (ref 0.0–0.1)
Basophils Relative: 0.7 % (ref 0.0–3.0)
Eosinophils Absolute: 0.1 K/uL (ref 0.0–0.7)
Eosinophils Relative: 1 % (ref 0.0–5.0)
HCT: 39.1 % (ref 36.0–46.0)
Hemoglobin: 13.1 g/dL (ref 12.0–15.0)
Lymphocytes Relative: 20.8 % (ref 12.0–46.0)
Lymphs Abs: 1.1 K/uL (ref 0.7–4.0)
MCHC: 33.5 g/dL (ref 30.0–36.0)
MCV: 98.9 fl (ref 78.0–100.0)
Monocytes Absolute: 0.6 K/uL (ref 0.1–1.0)
Monocytes Relative: 12.3 % — ABNORMAL HIGH (ref 3.0–12.0)
Neutro Abs: 3.3 K/uL (ref 1.4–7.7)
Neutrophils Relative %: 65.2 % (ref 43.0–77.0)
Platelets: 185 K/uL (ref 150.0–400.0)
RBC: 3.95 Mil/uL (ref 3.87–5.11)
RDW: 13.8 % (ref 11.5–15.5)
WBC: 5.1 K/uL (ref 4.0–10.5)

## 2023-09-12 LAB — C-REACTIVE PROTEIN: CRP: <1 mg/dL (ref 0.5–20.0)

## 2023-09-12 MED ORDER — TIRZEPATIDE-WEIGHT MANAGEMENT 7.5 MG/0.5ML ~~LOC~~ SOAJ: 7.5000 mg | SUBCUTANEOUS | 0 refills | Status: DC

## 2023-09-12 MED ORDER — ALBUTEROL SULFATE HFA 108 (90 BASE) MCG/ACT IN AERS: INHALATION_SPRAY | RESPIRATORY_TRACT | 0 refills | Status: AC

## 2023-09-12 NOTE — Patient Instructions (Signed)
 Increase your dose of Zepbound  7.5 mg weekly.  Please update me in 3 weeks.  Stop by the lab prior to leaving today. I will notify you of your results once received.   Please schedule a follow up visit for 3 months.  It was a pleasure to see you today!

## 2023-09-12 NOTE — Assessment & Plan Note (Addendum)
 Overall stable.  Following with rheumatology, office notes reviewed from June 2025. CMP, CBC, CRP ordered pending.  Continue prednisone  7.5 mg daily.  Refill provided for albuterol  inhaler.

## 2023-09-12 NOTE — Assessment & Plan Note (Signed)
 No improvement, in fact, weight gain since last visit.  Prednisone  is surely contributing, rheumatology is monitoring and attempting to wean.  Increase Zepbound  to 7.5 mg weekly for 1 month.  She will update in 3 weeks at that time if weight has not reduced then we will increase her dose to 10 mg weekly thereafter.  Close follow-up in 3 months.

## 2023-09-12 NOTE — Progress Notes (Signed)
 Subjective:    Patient ID: Hannah Klein, female    DOB: 1975/06/21, 48 y.o.   MRN: [id number]  HPI  Hannah Klein is a very pleasant 48 y.o. female with a significant medical history including interstitial lung disease, Sjogren syndrome with lung involvement, chronic respiratory failure, obesity, hyperlipidemia, OSA who presents today for follow-up of obesity.  She is also needing a refill of albuterol .  She is also needing updated lab work per rheumatology.  She was last evaluated on 06/13/2023 for her annual exam.  During this visit she mentioned Wegovy  had not been effective for weight loss despite her efforts and caused her persistent nausea and vomiting.  Because of this, she was switched to Zepbound .  She began Zepbound  2.5 mg weekly in May 2025.  She is doing better on Zepbound  in terms of side effects. She denies nausea and vomiting. She does have constipation which is chronic but no worse. She stopped weight watches as this was ineffective. She is tracking her weights at home on occasion and has ranged 220-228 pounds.   She is managed on prednisone  chronically and is weaning down to 7.5 mg daily now. Her goal weight is 195-200 per transplant team.  She is frustrated by her lack of weight loss despite her efforts.  Diet currently consists of:  Breakfast: Protein, sometimes fruit Lunch: Skips, sometimes a protein drink  Dinner: Airline pilot, sometimes canned veggies, [employer].  Snacks: Apple, seasoned pretzels, occasionally ice cream Desserts: Infrequently, chewing gum Beverages: Water with sugar free flavoring, sweet tea  Exercise: Exercising at the gym 2-3 days weekly, active at home.   Body mass index is 33.67 kg/m.    Wt Readings from Last 3 Encounters:  09/12/23 228 lb (103.4 kg)  06/13/23 225 lb (102.1 kg)  04/05/23 228 lb (103.4 kg)      Review of Systems  Respiratory:  Positive for cough and shortness of breath.   Gastrointestinal:   Positive for constipation. Negative for nausea and vomiting.         Past Medical History:  Diagnosis Date   Arthritis    Asthma    Environmental allergies    Esophageal dysphagia    Generalized anxiety disorder    GERD (gastroesophageal reflux disease)    History of hiatal hernia    Hx of colonic polyps    Hypertension    Lung disorder    Patient states she has two unidentified ILD   Migraines    MRSA (methicillin resistant Staphylococcus aureus)    Lt upper arm  approx 8 years ago   Pneumonitis 01/26/2022   Right tennis elbow 04/28/2021   Right-sided low back pain with right-sided sciatica 04/26/2015   S/P robot-assisted surgical procedure 03/08/2022   Sleep apnea     Social History   Socioeconomic History   Marital status: Single    Spouse name: Not on file   Number of children: 0   Years of education: Not on file   Highest education level: Associate degree: occupational, Scientist, product/process development, or vocational program  Occupational History   Occupation: Designer, industrial/product work  Tobacco Use   Smoking status: Former    Current packs/day: 0.00    Average packs/day: 2.0 packs/day for 18.0 years (36.1 ttl pk-yrs)    Types: Cigarettes    Start date: 50    Quit date: 03/16/2007    Years since quitting: 16.5    Passive exposure: Current   Smokeless tobacco: Never  Vaping Use   Vaping  status: Never Used  Substance and Sexual Activity   Alcohol use: No    Alcohol/week: 0.0 standard drinks of alcohol   Drug use: No   Sexual activity: Not Currently  Other Topics Concern   Not on file  Social History Narrative   Single.   Moved from [place] .   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 Filing for disability   Social Drivers of Health   Financial Resource Strain: Low Risk  (09/11/2023)   Overall Financial Resource Strain (CARDIA)    Difficulty of Paying Living Expenses: Not very hard  Food Insecurity: No Food Insecurity (09/11/2023)   Hunger Vital Sign    Worried About  Running Out of Food in the Last Year: Never true    Ran Out of Food in the Last Year: Never true  Transportation Needs: No Transportation Needs (09/11/2023)   PRAPARE - Administrator, Civil Service (Medical): No    Lack of Transportation (Non-Medical): No  Physical Activity: Unknown (09/11/2023)   Exercise Vital Sign    Days of Exercise per Week: 3 days    Minutes of Exercise per Session: Patient declined  Stress: No Stress Concern Present (09/11/2023)   [employer] of Occupational Health - Occupational Stress Questionnaire    Feeling of Stress: Only a little  Social Connections: Socially Isolated (09/11/2023)   Social Connection and Isolation Panel    Frequency of Communication with Friends and Family: More than three times a week    Frequency of Social Gatherings with Friends and Family: Three times a week    Attends Religious Services: Patient declined    Active Member of Clubs or Organizations: No    Attends Banker Meetings: Not on file    Marital Status: Never married  Intimate Partner Violence: Not At Risk (01/26/2022)   Humiliation, Afraid, Rape, and Kick questionnaire    Fear of Current or Ex-Partner: No    Emotionally Abused: No    Physically Abused: No    Sexually Abused: No    Past Surgical History:  Procedure Laterality Date   CHOLECYSTECTOMY     DILATION AND CURETTAGE OF UTERUS     INTERCOSTAL NERVE BLOCK Right 03/08/2022   Procedure: INTERCOSTAL NERVE BLOCK;  Surgeon: Shyrl Linnie KIDD, MD;  Location: [facility] OR;  Service: Thoracic;  Laterality: Right;   LUNG BIOPSY  02/2022   NECK SURGERY     Fusion to C6 and C7   TYMPANOSTOMY TUBE PLACEMENT     WRIST ARTHROSCOPY WITH DEBRIDEMENT Left 02/27/2018   Procedure: WRIST ARTHROSCOPY WITH DEBRIDEMENT;  Surgeon: Murrell Drivers, MD;  Location: [facility];  Service: Orthopedics;  Laterality: Left;    Family History  Problem Relation Age of Onset   Eczema Mother    Allergic  rhinitis Brother    Asthma Brother    Asthma Brother    Allergic rhinitis Brother    Urticaria Other    Angioedema Other    Breast cancer Neg Hx     Allergies  Allergen Reactions   Advair Diskus [Fluticasone -Salmeterol] Other (See Comments)    dries my throat out really bad'   Flonase  [Fluticasone ]     Nose bleeds   Lipitor [Atorvastatin Calcium ] Other (See Comments)    Elevations in blood pressure?    Current Outpatient Medications on File Prior to Visit  Medication Sig Dispense Refill   acetaminophen  (TYLENOL ) 500 MG tablet Take 2 tablets (1,000 mg total) by mouth every 6 (  six) hours as needed. 30 tablet 0   APPLE CIDER VINEGAR PO Take 450 mg by mouth daily.     azaTHIOprine (IMURAN) 50 MG tablet Take 200 mg by mouth daily.     Azelastine  HCl 137 MCG/SPRAY SOLN PLACE 2 SPRAYS INTO BOTH NOSTRILS 2 (TWO) TIMES DAILY. USE IN EACH NOSTRIL AS DIRECTED 30 mL 5   B Complex-C (B-COMPLEX WITH VITAMIN C) tablet Take 1 tablet by mouth daily.     Cholecalciferol (VITAMIN D3) 125 MCG (5000 UT) TABS Take 5,000 Units by mouth daily.     cycloSPORINE (RESTASIS) 0.05 % ophthalmic emulsion Place 1 drop into both eyes 2 (two) times daily.     desonide (DESOWEN) 0.05 % cream Apply 1 Application topically daily as needed.     econazole nitrate 1 % cream Apply 1 Application topically daily as needed.     esomeprazole  (NEXIUM ) 40 MG capsule Take 1 capsule (40 mg total) by mouth 2 (two) times daily. For heartburn. 180 capsule 3   famotidine  (PEPCID ) 40 MG tablet TAKE 1 TABLET (40 MG TOTAL) BY MOUTH 2 (TWO) TIMES DAILY. FOR HEARTBURN. 180 tablet 2   fexofenadine (ALLEGRA) 180 MG tablet Take 180 mg by mouth daily.     hydrOXYzine  (VISTARIL ) 50 MG capsule TAKE 1 CAPSULE BY MOUTH ONCE OR TWICE DAILY AS NEEDED FOR ANXIETY 180 capsule 2   levocetirizine (XYZAL ) 5 MG tablet TAKE 1 TABLET (5 MG TOTAL) BY MOUTH 2 (TWO) TIMES DAILY. FOR ALLERGIES 180 tablet 2   Multiple Vitamin (MULTIVITAMIN) capsule Take 1  capsule by mouth daily.     Multiple Vitamin (MULTIVITAMIN) tablet Take 1 tablet by mouth daily.     Multiple Vitamins-Minerals (IMMUNE SUPPORT PO) Take by mouth.     Omega-3 Fatty Acids (OMEGA-3 FISH OIL) 300 MG CAPS Take 1,000 mg by mouth.     predniSONE  (DELTASONE ) 10 MG tablet Take 1 tablet (10 mg total) by mouth daily. 30 tablet 3   pregabalin (LYRICA) 25 MG capsule Take 25 mg by mouth 2 (two) times daily.     rosuvastatin  (CRESTOR ) 10 MG tablet TAKE 1 TABLET BY MOUTH EVERY DAY FOR CHOLESTEROL 90 tablet 2   sulfamethoxazole -trimethoprim  (BACTRIM ) 400-80 MG tablet Take 1 tablet by mouth daily.     vitamin B-12 (CYANOCOBALAMIN ) 1000 MCG tablet Take 1,000 mcg by mouth daily.     vitamin C (ASCORBIC ACID) 500 MG tablet Take 500 mg by mouth daily.     vitamin E 180 MG (400 UNITS) capsule Take 400 Units by mouth daily.     doxycycline (MONODOX) 50 MG capsule Take 50 mg by mouth every 12 (twelve) hours. (Patient not taking: Reported on 09/12/2023)     formoterol  (PERFOROMIST ) 20 MCG/2ML nebulizer solution Take 20 mcg by nebulization 2 (two) times daily. (Patient not taking: Reported on 09/12/2023)     neomycin-polymyxin b -dexamethasone  (MAXITROL) 3.5-10000-0.1 OINT Place 1 Application into both eyes at bedtime. (Patient not taking: Reported on 09/12/2023)     ondansetron  (ZOFRAN ) 4 MG tablet Take 1 tablet (4 mg total) by mouth every 8 (eight) hours as needed for nausea or vomiting. (Patient not taking: Reported on 09/12/2023) 20 tablet 0   predniSONE  (DELTASONE ) 5 MG tablet Take 1 tablet (5 mg total) by mouth daily with breakfast. (Patient not taking: Reported on 09/12/2023) 90 tablet 1   No current facility-administered medications on file prior to visit.    BP 112/62   Pulse 72   Temp (!) 97.2 F (36.2 C) (  Temporal)   Ht 5' 9 (1.753 m)   Wt 228 lb (103.4 kg)   SpO2 96% Comment: 1 L of O2  BMI 33.67 kg/m  Objective:   Physical Exam Cardiovascular:     Rate and Rhythm: Normal rate and  regular rhythm.  Pulmonary:     Effort: Pulmonary effort is normal.     Breath sounds: Normal breath sounds.  Musculoskeletal:     Cervical back: Neck supple.  Skin:    General: Skin is warm and dry.  Neurological:     Mental Status: She is alert and oriented to person, place, and time.  Psychiatric:        Mood and Affect: Mood normal.           Assessment & Plan:  ILD (interstitial lung disease) (HCC) Assessment & Plan: Overall stable.  Following with rheumatology, office notes reviewed from June 2025. CMP, CBC, CRP ordered pending.  Continue prednisone  7.5 mg daily.  Refill provided for albuterol  inhaler.  Orders: -     Tirzepatide -Weight Management; Inject 7.5 mg into the skin once a week.  Dispense: 2 mL; Refill: 0 -     Comprehensive metabolic panel with GFR -     CBC with Differential/Platelet -     C-reactive protein -     Albuterol  Sulfate HFA; INHALE 1-2 PUFFS BY MOUTH EVERY 6 HOURS AS NEEDED FOR WHEEZE OR SHORTNESS OF BREATH  Dispense: 18 g; Refill: 0  Sjogren's syndrome with lung involvement (HCC) -     Tirzepatide -Weight Management; Inject 7.5 mg into the skin once a week.  Dispense: 2 mL; Refill: 0 -     Comprehensive metabolic panel with GFR -     CBC with Differential/Platelet -     C-reactive protein  Class 1 obesity due to excess calories with serious comorbidity and body mass index (BMI) of 33.0 to 33.9 in adult Assessment & Plan: No improvement, in fact, weight gain since last visit.  Prednisone  is surely contributing, rheumatology is monitoring and attempting to wean.  Increase Zepbound  to 7.5 mg weekly for 1 month.  She will update in 3 weeks at that time if weight has not reduced then we will increase her dose to 10 mg weekly thereafter.  Close follow-up in 3 months.  Orders: -     Tirzepatide -Weight Management; Inject 7.5 mg into the skin once a week.  Dispense: 2 mL; Refill: 0 -     Comprehensive metabolic panel with GFR -     CBC with  Differential/Platelet -     C-reactive protein        Comer MARLA Gaskins, NP

## 2023-09-16 DIAGNOSIS — D8989 Other specified disorders involving the immune mechanism, not elsewhere classified: Secondary | ICD-10-CM | POA: Diagnosis not present

## 2023-09-16 DIAGNOSIS — Z79899 Other long term (current) drug therapy: Secondary | ICD-10-CM | POA: Diagnosis not present

## 2023-09-16 DIAGNOSIS — R7401 Elevation of levels of liver transaminase levels: Secondary | ICD-10-CM | POA: Diagnosis not present

## 2023-09-16 DIAGNOSIS — K760 Fatty (change of) liver, not elsewhere classified: Secondary | ICD-10-CM | POA: Diagnosis not present

## 2023-09-16 DIAGNOSIS — M3502 Sicca syndrome with lung involvement: Secondary | ICD-10-CM | POA: Diagnosis not present

## 2023-09-16 DIAGNOSIS — J849 Interstitial pulmonary disease, unspecified: Secondary | ICD-10-CM | POA: Diagnosis not present

## 2023-09-25 DIAGNOSIS — J849 Interstitial pulmonary disease, unspecified: Secondary | ICD-10-CM | POA: Diagnosis not present

## 2023-10-13 ENCOUNTER — Other Ambulatory Visit: Payer: Self-pay | Admitting: Primary Care

## 2023-10-13 DIAGNOSIS — M3502 Sicca syndrome with lung involvement: Secondary | ICD-10-CM

## 2023-10-13 DIAGNOSIS — I10 Essential (primary) hypertension: Secondary | ICD-10-CM

## 2023-10-13 DIAGNOSIS — E785 Hyperlipidemia, unspecified: Secondary | ICD-10-CM

## 2023-10-13 DIAGNOSIS — J849 Interstitial pulmonary disease, unspecified: Secondary | ICD-10-CM

## 2023-10-13 DIAGNOSIS — E66811 Obesity, class 1: Secondary | ICD-10-CM

## 2023-10-13 NOTE — Telephone Encounter (Signed)
 Please call patient: Received refill for albuterol  inhaler. We refilled once month ago, does she need a refill again or is it on autofill with pharmacy?

## 2023-10-14 MED ORDER — ZEPBOUND 10 MG/0.5ML ~~LOC~~ SOAJ: 10.0000 mg | SUBCUTANEOUS | 0 refills | Status: DC

## 2023-10-14 NOTE — Telephone Encounter (Signed)
 Noted. Will send Rx for Zepbound  10 mg dose.

## 2023-10-14 NOTE — Telephone Encounter (Signed)
 Patient does not need refill on albuterol  inhaler yet. This was automated from the pharmacy.   Patient does need refill on Zepbound , states she took her last pen last night. She is agreeable to increasing the dose if okay. States she has not lost much weight with the 7.5mg  dose.

## 2023-10-26 DIAGNOSIS — J849 Interstitial pulmonary disease, unspecified: Secondary | ICD-10-CM | POA: Diagnosis not present

## 2023-10-30 ENCOUNTER — Other Ambulatory Visit: Payer: Self-pay | Admitting: Pulmonary Disease

## 2023-11-04 DIAGNOSIS — M3500 Sicca syndrome, unspecified: Secondary | ICD-10-CM | POA: Diagnosis not present

## 2023-11-04 DIAGNOSIS — J849 Interstitial pulmonary disease, unspecified: Secondary | ICD-10-CM | POA: Diagnosis not present

## 2023-11-04 DIAGNOSIS — D8989 Other specified disorders involving the immune mechanism, not elsewhere classified: Secondary | ICD-10-CM | POA: Diagnosis not present

## 2023-11-04 DIAGNOSIS — J9611 Chronic respiratory failure with hypoxia: Secondary | ICD-10-CM | POA: Diagnosis not present

## 2023-11-04 DIAGNOSIS — Z87891 Personal history of nicotine dependence: Secondary | ICD-10-CM | POA: Diagnosis not present

## 2023-11-04 DIAGNOSIS — J84113 Idiopathic non-specific interstitial pneumonitis: Secondary | ICD-10-CM | POA: Diagnosis not present

## 2023-11-04 DIAGNOSIS — Z79899 Other long term (current) drug therapy: Secondary | ICD-10-CM | POA: Diagnosis not present

## 2023-11-18 DIAGNOSIS — J849 Interstitial pulmonary disease, unspecified: Secondary | ICD-10-CM | POA: Diagnosis not present

## 2023-11-18 DIAGNOSIS — G6289 Other specified polyneuropathies: Secondary | ICD-10-CM | POA: Diagnosis not present

## 2023-11-18 DIAGNOSIS — R251 Tremor, unspecified: Secondary | ICD-10-CM | POA: Diagnosis not present

## 2023-11-18 DIAGNOSIS — R7401 Elevation of levels of liver transaminase levels: Secondary | ICD-10-CM | POA: Diagnosis not present

## 2023-11-18 DIAGNOSIS — D8989 Other specified disorders involving the immune mechanism, not elsewhere classified: Secondary | ICD-10-CM | POA: Diagnosis not present

## 2023-11-18 DIAGNOSIS — M3502 Sicca syndrome with lung involvement: Secondary | ICD-10-CM | POA: Diagnosis not present

## 2023-11-25 ENCOUNTER — Ambulatory Visit: Admitting: Pulmonary Disease

## 2023-11-25 ENCOUNTER — Encounter: Payer: Self-pay | Admitting: Pulmonary Disease

## 2023-11-25 VITALS — BP 136/82 | HR 69 | Temp 98.2°F | Ht 69.0 in | Wt 224.0 lb

## 2023-11-25 DIAGNOSIS — J4550 Severe persistent asthma, uncomplicated: Secondary | ICD-10-CM

## 2023-11-25 DIAGNOSIS — G4733 Obstructive sleep apnea (adult) (pediatric): Secondary | ICD-10-CM

## 2023-11-25 DIAGNOSIS — J849 Interstitial pulmonary disease, unspecified: Secondary | ICD-10-CM | POA: Diagnosis not present

## 2023-11-25 DIAGNOSIS — M3500 Sicca syndrome, unspecified: Secondary | ICD-10-CM

## 2023-11-25 NOTE — Patient Instructions (Signed)
  VISIT SUMMARY: You had a follow-up visit to discuss your Sjogren's syndrome with interstitial lung disease and peripheral neuropathy. Your lung function has shown mild improvement, and your oxygen  needs have decreased while sitting. We also discussed your current medications and planned diagnostic tests.  YOUR PLAN: SJOGREN'S SYNDROME WITH INTERSTITIAL LUNG DISEASE: Your condition is well-managed with slight improvement in lung function and walking tests. Your current medications include Rituxan, azathioprine, and prednisone . -Continue Rituxan infusions every six months. -Maintain azathioprine at 150 mg daily. -Continue prednisone  at 5 mg daily until January. -Schedule a CT scan in November. -Perform blood work in November.  PERIPHERAL NEUROPATHY: Your symptoms have worsened with the reduction of prednisone  dosage. Lyrica dosage has been increased to manage symptoms. -Increase Lyrica dosage as prescribed. -Maintain prednisone  at 5 mg daily until January.

## 2023-11-25 NOTE — Progress Notes (Signed)
 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 who  has a past medical history of Arthritis, Asthma, Environmental allergies, Esophageal dysphagia, Generalized anxiety disorder, GERD (gastroesophageal reflux disease), History of hiatal hernia, colonic polyps, Hypertension, Lung disorder, Migraines, MRSA (methicillin resistant Staphylococcus aureus), Pneumonitis (01/26/2022), Right tennis elbow (04/28/2021), Right-sided low back pain with right-sided sciatica (04/26/2015), S/P robot-assisted surgical procedure (03/08/2022), and Sleep apnea.   Sent to the ILD clinic by Dr. Shelah for evaluation of interstitial lung disease.  She has history of severe persistent asthma for many years and is followed by Dr. Iva.  She had been on tezpire for 4 months in 2023.  Last dose of tezpire is in 2024.  In spite of the biologic she had significant dyspnea and noted to have a D-dimer.  She had a CTA which showed groundglass opacities concerning for pneumonitis/pneumonia and was admitted on 01/25/2022.  Treated with antibiotics, prednisone  taper with minimal improvement.  She subsequently had surgical lung biopsy Dr. Shyrl in early January 2024 with pathology showing nonspecific pneumonitis/pulmonary fibrosis.  Workup in the past shows borderline elevated rheumatoid factor and she has rheumatology evaluation pending for later this year.    History also notable for hiatal hernia, esophageal dysphagia and she follows with Dr. Abran.  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 Consulted with Dr. Brien from rheumatology who feels that he has Sjogren's and possible other an unspecified connective tissue disease and agrees with the plan for immunosuppression.  Initially we tried to get Rituxan but was denied by insurance in 2024.  She was put on prednisone  and given Cytoxan  from June to November of 2024. She was also tried on CellCept  in 2024 but had to be stopped due to side effects.  Eventually started on azathioprine titrated up to 200 mg/day but developed LFT abnormalities and dose was reduced to 150 mg/day.  Rituxan approved after she got on Medicare and initiated May 2025  Interim history: Discussed the use of AI scribe software for clinical note transcription with the patient, who gave verbal consent to proceed.  History of Present Illness Hannah Klein is a 48 year old female with Sjogren's syndrome and interstitial lung disease who presents for follow-up.  Dyspnea and exercise tolerance - Mild improvement in walking and breathing tests since last visit - Oxygen  requirement reduced to 1 liter while sitting - Oxygen  requirement increased to 6-8 liters during exercise - Engages in physical activities including gym workouts and lawn mowing  Immunosuppressive therapy and adverse effects - Managed with Rituxan infusions, Imuran 150 mg daily, and prednisone  5 mg daily - Attempts to taper prednisone  below 5 mg have exacerbated neuropathy, necessitating increased Lyrica dosing  Planned diagnostic evaluation - CT scan and blood work scheduled for November    Relevant pulmonary history Pets: No pets Occupation: Of his Production designer, 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  No feather pillows or  comforters ILD questionnaire 04/18/2022-negative Smoking history: 36-pack-year smoker.  Quit in 2009 Travel history: Originally from [place] .  No significant recent travel Relevant family history: Brother does have asthma  Outpatient Encounter Medications as of 11/25/2023  Medication Sig   acetaminophen  (TYLENOL ) 500 MG tablet Take 2 tablets (1,000 mg total) by mouth every 6 (six) hours as needed.   albuterol  (VENTOLIN  HFA) 108 (90 Base) MCG/ACT inhaler INHALE 1-2 PUFFS BY MOUTH EVERY 6 HOURS AS NEEDED FOR WHEEZE OR SHORTNESS OF BREATH   APPLE CIDER VINEGAR PO Take 450 mg by mouth daily.   azaTHIOprine (IMURAN) 50 MG tablet Take 200 mg by mouth daily. (Patient taking differently: Take 200 mg by mouth daily. Taking 150mg )   Azelastine  HCl 137 MCG/SPRAY SOLN PLACE 2 SPRAYS INTO BOTH NOSTRILS 2 (TWO) TIMES DAILY. USE IN EACH NOSTRIL AS DIRECTED   B Complex-C (B-COMPLEX WITH VITAMIN C) tablet Take 1 tablet by mouth daily.   Cholecalciferol (VITAMIN D3) 125 MCG (5000 UT) TABS Take 5,000 Units by mouth daily.   cycloSPORINE (RESTASIS) 0.05 % ophthalmic emulsion Place 1 drop into both eyes 2 (two) times daily.   desonide (DESOWEN) 0.05 % cream Apply 1 Application topically daily as needed.   econazole nitrate 1 % cream Apply 1 Application topically daily as needed.   esomeprazole  (NEXIUM ) 40 MG capsule Take 1 capsule (40 mg total) by mouth 2 (two) times daily. For heartburn.   famotidine  (PEPCID ) 40 MG tablet TAKE 1 TABLET (40 MG TOTAL) BY MOUTH 2 (TWO) TIMES DAILY. FOR HEARTBURN.   fexofenadine (ALLEGRA) 180 MG tablet Take 180 mg by mouth daily.   formoterol  (PERFOROMIST ) 20 MCG/2ML nebulizer solution Take 20 mcg by nebulization 2 (two) times daily.   hydrOXYzine  (VISTARIL ) 50 MG capsule TAKE 1 CAPSULE BY MOUTH ONCE OR TWICE DAILY AS NEEDED FOR ANXIETY   levocetirizine (XYZAL ) 5 MG tablet TAKE 1 TABLET (5 MG TOTAL) BY MOUTH 2 (TWO) TIMES DAILY. FOR ALLERGIES   Multiple Vitamin (MULTIVITAMIN)  capsule Take 1 capsule by mouth daily.   Omega-3 Fatty Acids (OMEGA-3 FISH OIL) 300 MG CAPS Take 1,000 mg by mouth.   predniSONE  (DELTASONE ) 5 MG tablet Take 1 tablet (5 mg total) by mouth daily with breakfast.   pregabalin (LYRICA) 25 MG capsule Take 25 mg by mouth 2 (two) times daily. (Patient taking differently: Take 25 mg by mouth 2 (two) times daily. Taking 150 mg)   rosuvastatin  (CRESTOR ) 10 MG tablet TAKE 1 TABLET BY MOUTH EVERY DAY FOR CHOLESTEROL   sulfamethoxazole -trimethoprim  (BACTRIM ) 400-80 MG tablet Take 1 tablet by mouth daily.   tirzepatide  (ZEPBOUND ) 10 MG/0.5ML Pen Inject 10 mg into the skin once a week.   vitamin B-12 (CYANOCOBALAMIN ) 1000 MCG tablet Take 1,000 mcg by mouth daily.   vitamin C (ASCORBIC ACID) 500 MG tablet Take 500 mg by mouth daily.   vitamin E 180 MG (400 UNITS) capsule Take 400 Units by mouth daily.   doxycycline (MONODOX) 50 MG capsule Take 50 mg by mouth every 12 (twelve) hours. (Patient not taking: Reported on 11/25/2023)   Multiple Vitamin (MULTIVITAMIN) tablet Take 1 tablet by mouth daily. (Patient not taking: Reported on 11/25/2023)   Multiple Vitamins-Minerals (IMMUNE SUPPORT PO) Take by mouth. (Patient not taking: Reported on 11/25/2023)   neomycin-polymyxin b -dexamethasone  (MAXITROL) 3.5-10000-0.1 OINT Place 1 Application into both eyes at bedtime. (Patient not taking: Reported on 09/12/2023)   ondansetron  (ZOFRAN ) 4 MG tablet Take 1 tablet (  4 mg total) by mouth every 8 (eight) hours as needed for nausea or vomiting. (Patient not taking: Reported on 11/25/2023)   predniSONE  (DELTASONE ) 10 MG tablet Take 1 tablet (10 mg total) by mouth daily. (Patient not taking: Reported on 11/25/2023)   No facility-administered encounter medications on file as of 11/25/2023.   Vitals:   11/25/23 1542  BP: 136/82  Pulse: 69  Temp: 98.2 F (36.8 C)  Height: 5' 9 (1.753 m)  Weight: 224 lb (101.6 kg)  SpO2: 100% Comment: 1L  TempSrc: Oral  BMI (Calculated): 33.06      Physical Exam GEN: No acute distress. CV: Regular rate and rhythm, no murmurs. LUNGS: Clear to auscultation bilaterally, normal respiratory effort. SKIN JOINTS: Warm and dry, no rash.    Data Reviewed: Imaging: High resolution CT 04/11/2020-clear lungs with 4 mm lower lobe nodule Super D CT 04/07/2021-patchy groundglass opacities in the mid to lower lungs.  Stable lung nodules CTA 01/25/2022-no PE, moderate bilateral peripheral predominant groundglass opacities CT chest 02/22/2022-progressive interstitial lung disease with patchy groundglass, subpleural reticulation, traction bronchiectasis bilaterally with basal gradient.  Probable UIP pattern High resolution CT 04/25/2022-increasingly organized return of groundglass opacities with subpleural reticular densities, traction bronchiectasis.  Indeterminate for UIP High resolution CT 09/07/2022-slight interval progression of interstitial lung disease CT head and neck 09/07/2022-unremarkable I have reviewed the images personally.  PFTs: 03/07/2022 FVC 1.77 [41%], FEV1 1.60 [47%], F/F91, DLCO 7.03 [28%] Severe restrictive physiology.  Severe diffusion impairment.  11/04/2023 [[facility]] FVC 2.71 [69%], FEV1 2.14 [68%], F/F79, DLCO 9.22 [39.6]  6-minute walk 07/03/2023-468 Meters, 1536 Feet     Labs: Rheumatoid factor 01/25/2022- 41 ANA 01/25/2022-negative CCP 01/26/2022 less than 1 ANCA 08/03/2021-negative  CTD serologies 04/18/2022 ANA 1: 1280 ENA RNP 1.2, SSA greater than 200  Surgical lung biopsy 03/08/2022 No unclassifiable interstitial, chronic fibroinflammatory process.  Monitoring labs CMP 07/30/2022-alk phos 38 WBC 07/30/2022-WBC 16.8, 87.6 neutrophils  Sleep: Overnight oximetry on CPAP, room air dated 06/20/2022 Duration of study 8 hours 40 minutes Time spent less than 88% - 2 hours, 50 minutes Nadir O2 sat of 74%  Cardiac: Echocardiogram 01/26/2022-LVEF 60-65%, normal RV systolic size and function,  Right heart catheterization  11/26/2022-no evidence of pulmonary hypertension Assessment & Plan Connective tissue disease ILD, Sjogren's syndrome Seen as a second opinion at [facility] who feels that she may have antisynthetase syndrome as well. 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  Due to rapid progression on CT scan and pathology findings with fibroblastic foci we felt that the phenotype is aggressive and recommended aggressive immunotherapy and addition of antifibrotic therapy.  Currently on azathioprine 150 mg/day, prednisone  5 mg.  Further taper of prednisone  has been held as she has developed neuropathy as steroid dose is being reduced Currently on Lyrica for neuropathy with plans to reattempt a prednisone  taper later Has been started on Rituxan in May 2025 with improvement in lung function Overall she is improving with better lung function, exercise capacity.  Can hold off antifibrotic therapy for now. Follow-up for medication monitoring is being done at [facility] and she can check in with us  every year.  Severe persistent asthma Off tezspire  since November 2024 Continue Dulera.  She prefers the inhaler over to performist and Pulmicort  nebulizer  OSA Continue CPAP with oxygen    Plan/Recommendations: Follow labs for monitoring Continue Imuran, prednisone , Rituxan Follow-up in 12 months  Lonna Coder MD [facility] 11/25/2023, 3:49 PM  CC: Gretta Comer POUR, NP

## 2023-11-26 DIAGNOSIS — J849 Interstitial pulmonary disease, unspecified: Secondary | ICD-10-CM | POA: Diagnosis not present

## 2023-12-13 ENCOUNTER — Encounter: Payer: Self-pay | Admitting: Primary Care

## 2023-12-13 ENCOUNTER — Ambulatory Visit: Admitting: Primary Care

## 2023-12-13 VITALS — BP 128/66 | HR 50 | Temp 97.8°F | Ht 69.0 in | Wt 219.0 lb

## 2023-12-13 DIAGNOSIS — Z6832 Body mass index (BMI) 32.0-32.9, adult: Secondary | ICD-10-CM | POA: Diagnosis not present

## 2023-12-13 DIAGNOSIS — E66811 Obesity, class 1: Secondary | ICD-10-CM

## 2023-12-13 DIAGNOSIS — J849 Interstitial pulmonary disease, unspecified: Secondary | ICD-10-CM | POA: Diagnosis not present

## 2023-12-13 DIAGNOSIS — I10 Essential (primary) hypertension: Secondary | ICD-10-CM

## 2023-12-13 DIAGNOSIS — M3502 Sicca syndrome with lung involvement: Secondary | ICD-10-CM | POA: Diagnosis not present

## 2023-12-13 DIAGNOSIS — E6609 Other obesity due to excess calories: Secondary | ICD-10-CM

## 2023-12-13 DIAGNOSIS — E785 Hyperlipidemia, unspecified: Secondary | ICD-10-CM | POA: Diagnosis not present

## 2023-12-13 DIAGNOSIS — G4733 Obstructive sleep apnea (adult) (pediatric): Secondary | ICD-10-CM

## 2023-12-13 DIAGNOSIS — Z6833 Body mass index (BMI) 33.0-33.9, adult: Secondary | ICD-10-CM

## 2023-12-13 MED ORDER — ZEPBOUND 12.5 MG/0.5ML ~~LOC~~ SOAJ: 12.5000 mg | SUBCUTANEOUS | 0 refills | Status: DC

## 2023-12-13 NOTE — Patient Instructions (Signed)
 We increased your dose of Zepbound  to 12.5 mg weekly.  Please schedule a visit for 3 months.  It was a pleasure to see you today!

## 2023-12-13 NOTE — Assessment & Plan Note (Signed)
 Sleep study in 2022. Following with rheumatology and transplant team.

## 2023-12-13 NOTE — Progress Notes (Signed)
 Subjective:    Patient ID: Hannah Klein, female    DOB: 26-May-1975, 48 y.o.   MRN: [id number]  Hannah Klein is a very pleasant 48 y.o. female with a history of interstitial lung disease, hypertension, hyperlipidemia, sjogren syndrome, obesity who presents today for follow-up of obesity.  She is currently managed on Zepbound  10 mg weekly to assist with obesity so that she may undergo a double lung transplant in the future for her ILD and respiratory failure. Her goal weight is around 190 pounds.   Since her last visit she's lost 9 pounds. Her prednisone  was reduced to 5 mg about 4-6 weeks ago. She has a history of sleep apnea, diagnosed years ago. She does not use her machine as the transplant team told her that as long as she wears her oxygen  she was fine. She does have the machine at home. She had a sleep study on 07/12/2020 per Dr. Dante.   Her insurance no longer covers Zepbound  for her conditions. Zepbound  has been very helpful for her weight loss where Wegovy  was not. Wegovy  also caused intolerable side effects. She tolerates the Zepbound  well. She has seen a plateau in her weight over the last few weeks. She continues to eat a healthy diet and try to walk. Her chronic lung disease limits intense exercise.   BP Readings from Last 3 Encounters:  12/13/23 128/66  11/25/23 136/82  09/12/23 112/62   Wt Readings from Last 3 Encounters:  12/13/23 219 lb (99.3 kg)  11/25/23 224 lb (101.6 kg)  09/12/23 228 lb (103.4 kg)   Body mass index is 32.34 kg/m.    Review of Systems  Respiratory:  Positive for shortness of breath.   Cardiovascular:  Negative for chest pain.  Gastrointestinal:  Negative for abdominal pain, constipation, diarrhea and nausea.         Past Medical History:  Diagnosis Date   Arthritis    Asthma    Environmental allergies    Esophageal dysphagia    Generalized anxiety disorder    GERD (gastroesophageal reflux disease)    History of hiatal  hernia    Hx of colonic polyps    Hypertension    Lung disorder    Patient states she has two unidentified ILD   Migraines    MRSA (methicillin resistant Staphylococcus aureus)    Lt upper arm  approx 8 years ago   Pneumonitis 01/26/2022   Right tennis elbow 04/28/2021   Right-sided low back pain with right-sided sciatica 04/26/2015   S/P robot-assisted surgical procedure 03/08/2022   Sleep apnea     Social History   Socioeconomic History   Marital status: Single    Spouse name: Not on file   Number of children: 0   Years of education: Not on file   Highest education level: Associate degree: occupational, Scientist, product/process development, or vocational program  Occupational History   Occupation: Designer, industrial/product work  Tobacco Use   Smoking status: Former    Current packs/day: 0.00    Average packs/day: 2.0 packs/day for 18.0 years (36.1 ttl pk-yrs)    Types: Cigarettes    Start date: 62    Quit date: 03/16/2007    Years since quitting: 16.7    Passive exposure: Current   Smokeless tobacco: Never  Vaping Use   Vaping status: Never Used  Substance and Sexual Activity   Alcohol use: No    Alcohol/week: 0.0 standard drinks of alcohol   Drug use: No   Sexual activity: Not  Currently  Other Topics Concern   Not on file  Social History Narrative   Single.   Moved from [place] .   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 Filing for disability   Social [facility]   Financial Resource Strain: Low Risk  (12/08/2023)   Overall Financial Resource Strain (CARDIA)    Difficulty of Paying Living Expenses: Not hard at all  Food Insecurity: No Food Insecurity (12/08/2023)   Hunger Vital Sign    Worried About Running Out of Food in the Last Year: Never true    Ran Out of Food in the Last Year: Never true  Transportation Needs: No Transportation Needs (12/08/2023)   PRAPARE - Administrator, Civil Service (Medical): No    Lack of Transportation (Non-Medical): No  Physical  Activity: Insufficiently Active (12/08/2023)   Exercise Vital Sign    Days of Exercise per Week: 4 days    Minutes of Exercise per Session: 30 min  Stress: No Stress Concern Present (12/08/2023)   [employer] of Occupational Health - Occupational Stress Questionnaire    Feeling of Stress: Not at all  Social Connections: Socially Isolated (12/08/2023)   Social Connection and Isolation Panel    Frequency of Communication with Friends and Family: More than three times a week    Frequency of Social Gatherings with Friends and Family: Three times a week    Attends Religious Services: Patient declined    Active Member of Clubs or Organizations: No    Attends Banker Meetings: Not on file    Marital Status: Never married  Intimate Partner Violence: Not At Risk (01/26/2022)   Humiliation, Afraid, Rape, and Kick questionnaire    Fear of Current or Ex-Partner: No    Emotionally Abused: No    Physically Abused: No    Sexually Abused: No    Past Surgical History:  Procedure Laterality Date   CHOLECYSTECTOMY     DILATION AND CURETTAGE OF UTERUS     INTERCOSTAL NERVE BLOCK Right 03/08/2022   Procedure: INTERCOSTAL NERVE BLOCK;  Surgeon: Hannah Linnie KIDD, MD;  Location: [facility] OR;  Service: Thoracic;  Laterality: Right;   LUNG BIOPSY  02/2022   NECK SURGERY     Fusion to C6 and C7   TYMPANOSTOMY TUBE PLACEMENT     WRIST ARTHROSCOPY WITH DEBRIDEMENT Left 02/27/2018   Procedure: WRIST ARTHROSCOPY WITH DEBRIDEMENT;  Surgeon: Hannah Drivers, MD;  Location: [facility];  Service: Orthopedics;  Laterality: Left;    Family History  Problem Relation Age of Onset   Eczema Mother    Allergic rhinitis Brother    Asthma Brother    Asthma Brother    Allergic rhinitis Brother    Urticaria Other    Angioedema Other    Breast cancer Neg Hx     Allergies  Allergen Reactions   Advair Diskus [Fluticasone -Salmeterol] Other (See Comments)    dries my throat out really  bad'   Flonase  [Fluticasone ]     Nose bleeds   Lipitor [Atorvastatin Calcium ] Other (See Comments)    Elevations in blood pressure?    Current Outpatient Medications on File Prior to Visit  Medication Sig Dispense Refill   acetaminophen  (TYLENOL ) 500 MG tablet Take 2 tablets (1,000 mg total) by mouth every 6 (six) hours as needed. 30 tablet 0   albuterol  (VENTOLIN  HFA) 108 (90 Base) MCG/ACT inhaler INHALE 1-2 PUFFS BY MOUTH EVERY 6 HOURS AS NEEDED FOR WHEEZE OR  SHORTNESS OF BREATH 18 g 0   APPLE CIDER VINEGAR PO Take 450 mg by mouth daily.     azaTHIOprine (IMURAN) 50 MG tablet Take 200 mg by mouth daily.     Azelastine  HCl 137 MCG/SPRAY SOLN PLACE 2 SPRAYS INTO BOTH NOSTRILS 2 (TWO) TIMES DAILY. USE IN EACH NOSTRIL AS DIRECTED 30 mL 5   B Complex-C (B-COMPLEX WITH VITAMIN C) tablet Take 1 tablet by mouth daily.     Cholecalciferol (VITAMIN D3) 125 MCG (5000 UT) TABS Take 5,000 Units by mouth daily.     cycloSPORINE (RESTASIS) 0.05 % ophthalmic emulsion Place 1 drop into both eyes 2 (two) times daily.     desonide (DESOWEN) 0.05 % cream Apply 1 Application topically daily as needed.     econazole nitrate 1 % cream Apply 1 Application topically daily as needed.     esomeprazole  (NEXIUM ) 40 MG capsule Take 1 capsule (40 mg total) by mouth 2 (two) times daily. For heartburn. 180 capsule 3   famotidine  (PEPCID ) 40 MG tablet TAKE 1 TABLET (40 MG TOTAL) BY MOUTH 2 (TWO) TIMES DAILY. FOR HEARTBURN. 180 tablet 2   fexofenadine (ALLEGRA) 180 MG tablet Take 180 mg by mouth daily.     hydrOXYzine  (VISTARIL ) 50 MG capsule TAKE 1 CAPSULE BY MOUTH ONCE OR TWICE DAILY AS NEEDED FOR ANXIETY 180 capsule 2   levocetirizine (XYZAL ) 5 MG tablet TAKE 1 TABLET (5 MG TOTAL) BY MOUTH 2 (TWO) TIMES DAILY. FOR ALLERGIES 180 tablet 2   Omega-3 Fatty Acids (OMEGA-3 FISH OIL) 300 MG CAPS Take 1,000 mg by mouth.     predniSONE  (DELTASONE ) 5 MG tablet Take 1 tablet (5 mg total) by mouth daily with breakfast. 90 tablet 1    pregabalin (LYRICA) 25 MG capsule Take 25 mg by mouth 2 (two) times daily.     rosuvastatin  (CRESTOR ) 10 MG tablet TAKE 1 TABLET BY MOUTH EVERY DAY FOR CHOLESTEROL 90 tablet 2   sulfamethoxazole -trimethoprim  (BACTRIM ) 400-80 MG tablet Take 1 tablet by mouth daily.     vitamin B-12 (CYANOCOBALAMIN ) 1000 MCG tablet Take 1,000 mcg by mouth daily.     vitamin C (ASCORBIC ACID) 500 MG tablet Take 500 mg by mouth daily.     vitamin E 180 MG (400 UNITS) capsule Take 400 Units by mouth daily.     formoterol  (PERFOROMIST ) 20 MCG/2ML nebulizer solution Take 20 mcg by nebulization 2 (two) times daily. (Patient not taking: Reported on 12/13/2023)     Multiple Vitamin (MULTIVITAMIN) tablet Take 1 tablet by mouth daily. (Patient not taking: Reported on 12/13/2023)     Multiple Vitamins-Minerals (IMMUNE SUPPORT PO) Take by mouth. (Patient not taking: Reported on 12/13/2023)     neomycin-polymyxin b -dexamethasone  (MAXITROL) 3.5-10000-0.1 OINT Place 1 Application into both eyes at bedtime. (Patient not taking: Reported on 12/13/2023)     No current facility-administered medications on file prior to visit.    BP 128/66   Pulse (!) 50   Temp 97.8 F (36.6 C) (Temporal)   Ht 5' 9 (1.753 m)   Wt 219 lb (99.3 kg)   LMP  (LMP Unknown)   SpO2 100%   BMI 32.34 kg/m  Objective:   Physical Exam Cardiovascular:     Rate and Rhythm: Normal rate and regular rhythm.  Pulmonary:     Effort: Pulmonary effort is normal.     Breath sounds: Normal breath sounds.  Musculoskeletal:     Cervical back: Neck supple.  Skin:    General: Skin  is warm and dry.  Neurological:     Mental Status: She is alert and oriented to person, place, and time.  Psychiatric:        Mood and Affect: Mood normal.     Physical Exam        Assessment & Plan:  Class 1 obesity due to excess calories with serious comorbidity and body mass index (BMI) of 32.0 to 32.9 in adult Assessment & Plan: Commended her on 9 pound weight  loss!!  I do believe that Zepbound  has been beneficial for her.  Increase dose to 12.5 mg weekly for further benefit.   Continue to work on [employer].  Follow-up in January 2026.   ILD (interstitial lung disease) (HCC) -     Zepbound ; Inject 12.5 mg into the skin once a week.  Dispense: 6 mL; Refill: 0  Sjogren's syndrome with lung involvement (HCC) -     Zepbound ; Inject 12.5 mg into the skin once a week.  Dispense: 6 mL; Refill: 0  Class 1 obesity due to excess calories with serious comorbidity and body mass index (BMI) of 33.0 to 33.9 in adult Assessment & Plan: Commended her on 9 pound weight loss!!  I do believe that Zepbound  has been beneficial for her.  Increase dose to 12.5 mg weekly for further benefit.   Continue to work on [employer].  Follow-up in January 2026.  Orders: -     Zepbound ; Inject 12.5 mg into the skin once a week.  Dispense: 6 mL; Refill: 0  Hyperlipidemia, unspecified hyperlipidemia type -     Zepbound ; Inject 12.5 mg into the skin once a week.  Dispense: 6 mL; Refill: 0  Essential hypertension -     Zepbound ; Inject 12.5 mg into the skin once a week.  Dispense: 6 mL; Refill: 0  OSA (obstructive sleep apnea) Assessment & Plan: Sleep study in 2022. Following with rheumatology and transplant team.  Orders: -     Zepbound ; Inject 12.5 mg into the skin once a week.  Dispense: 6 mL; Refill: 0  Sjogren syndrome with lung involvement (HCC) Assessment & Plan: Commended her on 9 pound weight loss!!  I do believe that Zepbound  has been beneficial for her.  Increase dose to 12.5 mg weekly for further benefit.   Continue to work on [employer].  Follow-up in January 2026.     Assessment and Plan Assessment & Plan         Comer MARLA Gaskins, NP   History of Present Illness

## 2023-12-13 NOTE — Assessment & Plan Note (Signed)
 Commended her on 9 pound weight loss!!  I do believe that Zepbound  has been beneficial for her.  Increase dose to 12.5 mg weekly for further benefit.   Continue to work on [employer].  Follow-up in January 2026.

## 2023-12-16 ENCOUNTER — Telehealth: Payer: Self-pay

## 2023-12-16 ENCOUNTER — Other Ambulatory Visit (HOSPITAL_COMMUNITY): Payer: Self-pay

## 2023-12-16 NOTE — Telephone Encounter (Signed)
 She did not have a BMI of 40 or higher before starting Zepbound .  She does have sleep apnea.  Will this qualify her?  She is in need of a lung transplant.  Before she can undergo the transplant she must lose weight.  She was intolerant to Wegovy  and has done well on Zepbound .  Wt Readings from Last 3 Encounters:  12/13/23 219 lb (99.3 kg)  11/25/23 224 lb (101.6 kg)  09/12/23 228 lb (103.4 kg)

## 2023-12-16 NOTE — Telephone Encounter (Signed)
 Pharmacy Patient Advocate Encounter   Received notification from Onbase that prior authorization for Zepbound  12.5 is required/requested.   Insurance verification completed.   The patient is insured through [facility] MEDICAID.   Per test claim: PA required; PA submitted to above mentioned insurance via Latent Key/confirmation #/[id number] Status is pending

## 2023-12-16 NOTE — Telephone Encounter (Signed)
 Pharmacy Patient Advocate Encounter  Received notification from [facility] MEDICAID that Prior Authorization for Zepbound  12.5 has been DENIED.  Full denial letter will be uploaded to the media tab. See denial reason below.     PA #/Case ID/Reference #: # [id number]

## 2023-12-26 ENCOUNTER — Other Ambulatory Visit (HOSPITAL_COMMUNITY): Payer: Self-pay

## 2023-12-26 DIAGNOSIS — J849 Interstitial pulmonary disease, unspecified: Secondary | ICD-10-CM | POA: Diagnosis not present

## 2024-01-06 DIAGNOSIS — H0288B Meibomian gland dysfunction left eye, upper and lower eyelids: Secondary | ICD-10-CM | POA: Diagnosis not present

## 2024-01-06 DIAGNOSIS — H04123 Dry eye syndrome of bilateral lacrimal glands: Secondary | ICD-10-CM | POA: Diagnosis not present

## 2024-01-06 DIAGNOSIS — H0288A Meibomian gland dysfunction right eye, upper and lower eyelids: Secondary | ICD-10-CM | POA: Diagnosis not present

## 2024-01-08 DIAGNOSIS — J849 Interstitial pulmonary disease, unspecified: Secondary | ICD-10-CM | POA: Diagnosis not present

## 2024-01-08 DIAGNOSIS — D8989 Other specified disorders involving the immune mechanism, not elsewhere classified: Secondary | ICD-10-CM | POA: Diagnosis not present

## 2024-01-08 DIAGNOSIS — M3502 Sicca syndrome with lung involvement: Secondary | ICD-10-CM | POA: Diagnosis not present

## 2024-01-13 DIAGNOSIS — D8989 Other specified disorders involving the immune mechanism, not elsewhere classified: Secondary | ICD-10-CM | POA: Diagnosis not present

## 2024-01-13 DIAGNOSIS — J849 Interstitial pulmonary disease, unspecified: Secondary | ICD-10-CM | POA: Diagnosis not present

## 2024-01-13 DIAGNOSIS — Z7682 Awaiting organ transplant status: Secondary | ICD-10-CM | POA: Diagnosis not present

## 2024-01-22 DIAGNOSIS — J849 Interstitial pulmonary disease, unspecified: Secondary | ICD-10-CM | POA: Diagnosis not present

## 2024-01-22 DIAGNOSIS — D8989 Other specified disorders involving the immune mechanism, not elsewhere classified: Secondary | ICD-10-CM | POA: Diagnosis not present

## 2024-01-22 DIAGNOSIS — M3502 Sicca syndrome with lung involvement: Secondary | ICD-10-CM | POA: Diagnosis not present

## 2024-03-07 ENCOUNTER — Other Ambulatory Visit: Payer: Self-pay | Admitting: Primary Care

## 2024-03-07 DIAGNOSIS — K219 Gastro-esophageal reflux disease without esophagitis: Secondary | ICD-10-CM

## 2024-03-17 ENCOUNTER — Encounter: Payer: Self-pay | Admitting: Primary Care

## 2024-03-17 ENCOUNTER — Ambulatory Visit: Admitting: Primary Care

## 2024-03-17 VITALS — BP 126/70 | HR 73 | Temp 98.0°F | Ht 69.0 in | Wt 248.0 lb

## 2024-03-17 DIAGNOSIS — G4733 Obstructive sleep apnea (adult) (pediatric): Secondary | ICD-10-CM | POA: Diagnosis not present

## 2024-03-17 DIAGNOSIS — K219 Gastro-esophageal reflux disease without esophagitis: Secondary | ICD-10-CM | POA: Diagnosis not present

## 2024-03-17 DIAGNOSIS — J4540 Moderate persistent asthma, uncomplicated: Secondary | ICD-10-CM | POA: Diagnosis not present

## 2024-03-17 DIAGNOSIS — J849 Interstitial pulmonary disease, unspecified: Secondary | ICD-10-CM | POA: Diagnosis not present

## 2024-03-17 DIAGNOSIS — E785 Hyperlipidemia, unspecified: Secondary | ICD-10-CM

## 2024-03-17 DIAGNOSIS — Z6836 Body mass index (BMI) 36.0-36.9, adult: Secondary | ICD-10-CM

## 2024-03-17 DIAGNOSIS — E66812 Obesity, class 2: Secondary | ICD-10-CM

## 2024-03-17 DIAGNOSIS — I10 Essential (primary) hypertension: Secondary | ICD-10-CM

## 2024-03-17 DIAGNOSIS — M3502 Sicca syndrome with lung involvement: Secondary | ICD-10-CM | POA: Diagnosis not present

## 2024-03-17 MED ORDER — ZEPBOUND 2.5 MG/0.5ML ~~LOC~~ SOAJ: 2.5000 mg | SUBCUTANEOUS | 0 refills | Status: AC

## 2024-03-17 MED ORDER — FAMOTIDINE 40 MG PO TABS: 40.0000 mg | ORAL_TABLET | Freq: Two times a day (BID) | ORAL | 0 refills | Status: AC

## 2024-03-17 MED ORDER — ROSUVASTATIN CALCIUM 10 MG PO TABS: 10.0000 mg | ORAL_TABLET | Freq: Every day | ORAL | 0 refills | Status: AC

## 2024-03-17 MED ORDER — LEVOCETIRIZINE DIHYDROCHLORIDE 5 MG PO TABS: 5.0000 mg | ORAL_TABLET | Freq: Two times a day (BID) | ORAL | 0 refills | Status: AC

## 2024-03-17 NOTE — Assessment & Plan Note (Signed)
 Uncontrolled.  Will resume Zepbound  as she had intolerable side effects with Wegovy .   Start tirzepitide (Zepbound ) medication for weight loss.  Start by injecting 2.5 mg into the skin once weekly for 4 weeks, then increase to 5 mg once weekly thereafter.    Follow up in April.

## 2024-03-17 NOTE — Progress Notes (Signed)
 "  Subjective:    Patient ID: Hannah Klein, female    DOB: 1975/05/21, 49 y.o.   MRN: [id number]  Hannah Klein is a very pleasant 49 y.o. female with a significant medical history including Sjogren syndrome with lung involvement, interstitial lung disease, moderate persistent asthma, OSA, obesity, hypertension who presents today for follow-up of obesity.  She is also needing medication refills  Previously managed on Wegovy  injections which caused intolerable side effects.  Last initiated managed on Zepbound  at the 10 mg weekly until October 2025.  She had to discontinue as insurance with a longer cover.  Since her last visit she has regained her weight plus more. Her insurance will now resume coverage of GLP 1 agonist treatment. She would like to resume as she did very well on treatment.   She still needs to lose down to 190 pounds in order to proceed with lung transplant. She remains on prednisone  5 mg daily. She admits to eating poorly over the last few months but she was frustrated with lack of coverage of GLP 1 treatment. She continues to experience food noise and cravings.  She is now back on track with her diet and has begun to exercise again   Body mass index is 36.62 kg/m.   Wt Readings from Last 3 Encounters:  03/17/24 248 lb (112.5 kg)  12/13/23 219 lb (99.3 kg)  11/25/23 224 lb (101.6 kg)   BP Readings from Last 3 Encounters:  03/17/24 126/70  12/13/23 128/66  11/25/23 136/82      Review of Systems  Respiratory:  Positive for shortness of breath.   Cardiovascular:  Negative for chest pain.  Gastrointestinal:  Negative for constipation and diarrhea.  Neurological:  Negative for dizziness.         Past Medical History:  Diagnosis Date   Arthritis    Asthma    Environmental allergies    Esophageal dysphagia    Generalized anxiety disorder    GERD (gastroesophageal reflux disease)    History of hiatal hernia    Hx of colonic polyps     Hypertension    Lung disorder    Patient states she has two unidentified ILD   Migraines    MRSA (methicillin resistant Staphylococcus aureus)    Lt upper arm  approx 8 years ago   Pneumonitis 01/26/2022   Right tennis elbow 04/28/2021   Right-sided low back pain with right-sided sciatica 04/26/2015   S/P robot-assisted surgical procedure 03/08/2022   Sleep apnea     Social History   Socioeconomic History   Marital status: Single    Spouse name: Not on file   Number of children: 0   Years of education: Not on file   Highest education level: Associate degree: occupational, scientist, product/process development, or vocational program  Occupational History   Occupation: Designer, Industrial/product work  Tobacco Use   Smoking status: Former    Current packs/day: 0.00    Average packs/day: 2.0 packs/day for 18.0 years (36.1 ttl pk-yrs)    Types: Cigarettes    Start date: 17    Quit date: 03/16/2007    Years since quitting: 17.0    Passive exposure: Current   Smokeless tobacco: Never  Vaping Use   Vaping status: Never Used  Substance and Sexual Activity   Alcohol use: No    Alcohol/week: 0.0 standard drinks of alcohol   Drug use: No   Sexual activity: Not Currently  Other Topics Concern   Not on file  Social History  Narrative   Single.   Moved from [place] .   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 Filing for disability   Social Drivers of Health   Tobacco Use: Medium Risk (03/17/2024)   Patient History    Smoking Tobacco Use: Former    Smokeless Tobacco Use: Never    Passive Exposure: Current  Physicist, Medical Strain: Low Risk (12/08/2023)   Overall Financial Resource Strain (CARDIA)    Difficulty of Paying Living Expenses: Not hard at all  Food Insecurity: No Food Insecurity (12/08/2023)   Epic    Worried About Programme Researcher, Broadcasting/film/video in the Last Year: Never true    Ran Out of Food in the Last Year: Never true  Transportation Needs: No Transportation Needs (12/08/2023)   Epic    Lack of  Transportation (Medical): No    Lack of Transportation (Non-Medical): No  Physical Activity: Insufficiently Active (12/08/2023)   Exercise Vital Sign    Days of Exercise per Week: 4 days    Minutes of Exercise per Session: 30 min  Stress: No Stress Concern Present (12/08/2023)   [employer] of Occupational Health - Occupational Stress Questionnaire    Feeling of Stress: Not at all  Social Connections: Socially Isolated (12/08/2023)   Social Connection and Isolation Panel    Frequency of Communication with Friends and Family: More than three times a week    Frequency of Social Gatherings with Friends and Family: Three times a week    Attends Religious Services: Patient declined    Active Member of Clubs or Organizations: No    Attends Banker Meetings: Not on file    Marital Status: Never married  Intimate Partner Violence: Not At Risk (01/26/2022)   Humiliation, Afraid, Rape, and Kick questionnaire    Fear of Current or Ex-Partner: No    Emotionally Abused: No    Physically Abused: No    Sexually Abused: No  Depression (PHQ2-9): Low Risk (04/05/2023)   Depression (PHQ2-9)    PHQ-2 Score: 0  Alcohol Screen: Not on file  Housing: Low Risk (12/08/2023)   Epic    Unable to Pay for Housing in the Last Year: No    Number of Times Moved in the Last Year: 0    Homeless in the Last Year: No  Utilities: Not At Risk (01/26/2022)   [facility] Utilities    Threatened with loss of utilities: No  Health Literacy: Not on file    Past Surgical History:  Procedure Laterality Date   CHOLECYSTECTOMY     DILATION AND CURETTAGE OF UTERUS     INTERCOSTAL NERVE BLOCK Right 03/08/2022   Procedure: INTERCOSTAL NERVE BLOCK;  Surgeon: Shyrl Linnie KIDD, MD;  Location: [facility] OR;  Service: Thoracic;  Laterality: Right;   LUNG BIOPSY  02/2022   NECK SURGERY     Fusion to C6 and C7   TYMPANOSTOMY TUBE PLACEMENT     WRIST ARTHROSCOPY WITH DEBRIDEMENT Left 02/27/2018   Procedure: WRIST  ARTHROSCOPY WITH DEBRIDEMENT;  Surgeon: Murrell Drivers, MD;  Location: [facility];  Service: Orthopedics;  Laterality: Left;    Family History  Problem Relation Age of Onset   Eczema Mother    Allergic rhinitis Brother    Asthma Brother    Asthma Brother    Allergic rhinitis Brother    Urticaria Other    Angioedema Other    Breast cancer Neg Hx     Allergies[1]  Medications Ordered Prior to Encounter[2]  BP 126/70   Pulse 73   Temp 98 F (36.7 C) (Oral)   Ht 5' 9 (1.753 m)   Wt 248 lb (112.5 kg)   SpO2 99% Comment: 1 L O2  BMI 36.62 kg/m  Objective:   Physical Exam Cardiovascular:     Rate and Rhythm: Normal rate and regular rhythm.  Pulmonary:     Effort: Pulmonary effort is normal.     Breath sounds: Normal breath sounds.  Musculoskeletal:     Cervical back: Neck supple.  Skin:    General: Skin is warm and dry.  Neurological:     Mental Status: She is alert and oriented to person, place, and time.  Psychiatric:        Mood and Affect: Mood normal.     Physical Exam        Assessment & Plan:  ILD (interstitial lung disease) (HCC) -     Zepbound ; Inject 2.5 mg into the skin once a week.  Dispense: 2 mL; Refill: 0  Sjogren's syndrome with lung involvement (HCC) -     Zepbound ; Inject 2.5 mg into the skin once a week.  Dispense: 2 mL; Refill: 0  OSA (obstructive sleep apnea) -     Zepbound ; Inject 2.5 mg into the skin once a week.  Dispense: 2 mL; Refill: 0  Essential hypertension -     Zepbound ; Inject 2.5 mg into the skin once a week.  Dispense: 2 mL; Refill: 0  Hyperlipidemia, unspecified hyperlipidemia type -     Zepbound ; Inject 2.5 mg into the skin once a week.  Dispense: 2 mL; Refill: 0 -     Rosuvastatin  Calcium ; Take 1 tablet (10 mg total) by mouth daily. for cholesterol.  Dispense: 90 tablet; Refill: 0  Class 2 severe obesity due to excess calories with serious comorbidity and body mass index (BMI) of 36.0 to 36.9 in  adult Assessment & Plan: Uncontrolled.  Will resume Zepbound  as she had intolerable side effects with Wegovy .   Start tirzepitide (Zepbound ) medication for weight loss.  Start by injecting 2.5 mg into the skin once weekly for 4 weeks, then increase to 5 mg once weekly thereafter.    Follow up in April.  Orders: -     Zepbound ; Inject 2.5 mg into the skin once a week.  Dispense: 2 mL; Refill: 0  Gastroesophageal reflux disease, unspecified whether esophagitis present -     Famotidine ; Take 1 tablet (40 mg total) by mouth 2 (two) times daily. for heartburn.  Dispense: 180 tablet; Refill: 0  Moderate persistent asthma, uncomplicated -     Levocetirizine Dihydrochloride ; Take 1 tablet (5 mg total) by mouth 2 (two) times daily. For allergies  Dispense: 180 tablet; Refill: 0    Assessment and Plan Assessment & Plan         Comer MARLA Gaskins, NP       [1]  Allergies Allergen Reactions   Advair Diskus [Fluticasone -Salmeterol] Other (See Comments)    dries my throat out really bad'   Flonase  [Fluticasone ]     Nose bleeds   Lipitor [Atorvastatin Calcium ] Other (See Comments)    Elevations in blood pressure?  [2]  Current Outpatient Medications on File Prior to Visit  Medication Sig Dispense Refill   acetaminophen  (TYLENOL ) 500 MG tablet Take 2 tablets (1,000 mg total) by mouth every 6 (six) hours as needed. 30 tablet 0   albuterol  (VENTOLIN  HFA) 108 (90 Base) MCG/ACT inhaler INHALE 1-2 PUFFS BY MOUTH EVERY  6 HOURS AS NEEDED FOR WHEEZE OR SHORTNESS OF BREATH 18 g 0   amLODipine (NORVASC) 5 MG tablet Take by mouth.     APPLE CIDER VINEGAR PO Take 450 mg by mouth daily.     azaTHIOprine (IMURAN) 50 MG tablet Take 200 mg by mouth daily.     Azelastine  HCl 137 MCG/SPRAY SOLN PLACE 2 SPRAYS INTO BOTH NOSTRILS 2 (TWO) TIMES DAILY. USE IN EACH NOSTRIL AS DIRECTED 30 mL 5   B Complex-C (B-COMPLEX WITH VITAMIN C) tablet Take 1 tablet by mouth daily.     Cholecalciferol (VITAMIN  D3) 125 MCG (5000 UT) TABS Take 5,000 Units by mouth daily.     cycloSPORINE (RESTASIS) 0.05 % ophthalmic emulsion Place 1 drop into both eyes 2 (two) times daily.     desonide (DESOWEN) 0.05 % cream Apply 1 Application topically daily as needed.     econazole nitrate 1 % cream Apply 1 Application topically daily as needed.     esomeprazole  (NEXIUM ) 40 MG capsule Take 1 capsule (40 mg total) by mouth 2 (two) times daily. For heartburn. 180 capsule 3   fexofenadine (ALLEGRA) 180 MG tablet Take 180 mg by mouth daily.     formoterol  (PERFOROMIST ) 20 MCG/2ML nebulizer solution Take 20 mcg by nebulization 2 (two) times daily.     hydroxychloroquine (PLAQUENIL) 200 MG tablet Take 400 mg by mouth.     hydrOXYzine  (VISTARIL ) 50 MG capsule TAKE 1 CAPSULE BY MOUTH ONCE OR TWICE DAILY AS NEEDED FOR ANXIETY 180 capsule 2   Multiple Vitamin (MULTIVITAMIN) tablet Take 1 tablet by mouth daily.     Multiple Vitamins-Minerals (IMMUNE SUPPORT PO) Take by mouth.     neomycin-polymyxin b -dexamethasone  (MAXITROL) 3.5-10000-0.1 OINT Place 1 Application into both eyes at bedtime.     Omega-3 Fatty Acids (OMEGA-3 FISH OIL) 300 MG CAPS Take 1,000 mg by mouth.     predniSONE  (DELTASONE ) 5 MG tablet Take 1 tablet (5 mg total) by mouth daily with breakfast. 90 tablet 1   pregabalin (LYRICA) 25 MG capsule Take 25 mg by mouth 2 (two) times daily.     sulfamethoxazole -trimethoprim  (BACTRIM ) 400-80 MG tablet Take 1 tablet by mouth daily.     vitamin B-12 (CYANOCOBALAMIN ) 1000 MCG tablet Take 1,000 mcg by mouth daily.     vitamin C (ASCORBIC ACID) 500 MG tablet Take 500 mg by mouth daily.     vitamin E 180 MG (400 UNITS) capsule Take 400 Units by mouth daily.     No current facility-administered medications on file prior to visit.   "

## 2024-03-17 NOTE — Patient Instructions (Signed)
 Start tirzepitide (Zepbound ) medication for weight loss.  Start by injecting 2.5 mg into the skin once weekly for 4 weeks, then increase to 5 mg once weekly thereafter.  Please notify me once you have used your last 2.5 mg dose, and I will send the increased dose of 5 mg to your pharmacy.  Please schedule a physical to meet with me in 3 months.    It was a pleasure to see you today!

## 2024-03-25 ENCOUNTER — Other Ambulatory Visit (HOSPITAL_COMMUNITY): Payer: Self-pay

## 2024-03-30 ENCOUNTER — Telehealth: Payer: Self-pay | Admitting: Pharmacy Technician

## 2024-03-30 ENCOUNTER — Other Ambulatory Visit (HOSPITAL_COMMUNITY): Payer: Self-pay

## 2024-03-30 NOTE — Telephone Encounter (Signed)
 Pharmacy Patient Advocate Encounter   Received notification from Onbase CMM KEY that prior authorization for Zepbound  2.5MG /0.5ML pen-injectors  is required/requested.   Insurance verification completed.   The patient is insured through [facility] MEDICAID.   Per test claim: PA required; PA submitted to above mentioned insurance via Latent Key/confirmation #/EOC WHOLE FOODS Status is pending

## 2024-04-01 NOTE — Telephone Encounter (Signed)
 Hannah Klein, can you help? We need to get her Zepbound  re-approved.  She was approved for Zepbound  last year but this was discontinued by Medicaid in October.  She is needing a lung transplant and must lose weight first.  She had terrible side effects to Wegovy .  She does have moderate sleep apnea with sleep study in the media section from 2022.

## 2024-06-16 ENCOUNTER — Encounter: Admitting: Primary Care
